# Patient Record
Sex: Male | Born: 1955
Health system: Southern US, Community
[De-identification: ages and names within clinical notes are randomized; demographics above are authoritative.]

## PROBLEM LIST (undated history)

## (undated) DIAGNOSIS — E785 Hyperlipidemia, unspecified: Secondary | ICD-10-CM

## (undated) DIAGNOSIS — M545 Low back pain, unspecified: Secondary | ICD-10-CM

## (undated) DIAGNOSIS — R7303 Prediabetes: Secondary | ICD-10-CM

## (undated) DIAGNOSIS — M25562 Pain in left knee: Secondary | ICD-10-CM

## (undated) DIAGNOSIS — R972 Elevated prostate specific antigen [PSA]: Secondary | ICD-10-CM

## (undated) DIAGNOSIS — M67911 Unspecified disorder of synovium and tendon, right shoulder: Secondary | ICD-10-CM

## (undated) DIAGNOSIS — C61 Malignant neoplasm of prostate: Secondary | ICD-10-CM

## (undated) DIAGNOSIS — E119 Type 2 diabetes mellitus without complications: Secondary | ICD-10-CM

## (undated) DIAGNOSIS — G4730 Sleep apnea, unspecified: Secondary | ICD-10-CM

## (undated) HISTORY — DX: Sleep apnea, unspecified: G47.30

## (undated) HISTORY — PX: COLONOSCOPY: SHX174

## (undated) HISTORY — DX: Hyperlipidemia, unspecified: E78.5

## (undated) HISTORY — PX: PROSTATE BIOPSY: SHX241

---

## 2014-01-11 ENCOUNTER — Encounter (HOSPITAL_COMMUNITY): Payer: Self-pay | Admitting: Emergency Medicine

## 2014-01-11 ENCOUNTER — Emergency Department (HOSPITAL_COMMUNITY)
Admission: EM | Admit: 2014-01-11 | Discharge: 2014-01-11 | Disposition: A | Discharge status: Home / Self Care | Payer: No Typology Code available for payment source | Attending: Emergency Medicine | Admitting: Emergency Medicine

## 2014-01-11 DIAGNOSIS — Y92410 Unspecified street and highway as the place of occurrence of the external cause: Secondary | ICD-10-CM | POA: Insufficient documentation

## 2014-01-11 DIAGNOSIS — Y9389 Activity, other specified: Secondary | ICD-10-CM | POA: Insufficient documentation

## 2014-01-11 DIAGNOSIS — IMO0002 Reserved for concepts with insufficient information to code with codable children: Secondary | ICD-10-CM | POA: Insufficient documentation

## 2014-01-11 DIAGNOSIS — S139XXA Sprain of joints and ligaments of unspecified parts of neck, initial encounter: Secondary | ICD-10-CM | POA: Insufficient documentation

## 2014-01-11 DIAGNOSIS — S161XXA Strain of muscle, fascia and tendon at neck level, initial encounter: Secondary | ICD-10-CM

## 2014-01-11 DIAGNOSIS — S0990XA Unspecified injury of head, initial encounter: Secondary | ICD-10-CM | POA: Insufficient documentation

## 2014-01-11 MED ORDER — NAPROXEN 500 MG PO TABS: 500.0000 mg | ORAL_TABLET | Freq: Two times a day (BID) | ORAL | Status: DC
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
## 2014-01-15 NOTE — ED Provider Notes (Signed)
Medical screening examination/treatment/procedure(s) were performed by non-physician practitioner and as supervising physician I was immediately available for consultation/collaboration.   EKG Interpretation None       Keyaira Clapham R. Amad Mau, MD 01/15/14 0032 

## 2017-02-09 ENCOUNTER — Ambulatory Visit (HOSPITAL_COMMUNITY)
Admission: RE | Admit: 2017-02-09 | Discharge: 2017-02-09 | Disposition: A | Discharge status: Home / Self Care | Payer: Self-pay | Source: Ambulatory Visit | Attending: Family Medicine | Admitting: Family Medicine

## 2017-02-09 ENCOUNTER — Ambulatory Visit: Payer: Self-pay | Attending: Family Medicine | Admitting: Family Medicine

## 2017-02-09 VITALS — BP 144/73 | HR 87 | Temp 97.9°F | Resp 18 | Ht 73.0 in | Wt 236.6 lb

## 2017-02-09 DIAGNOSIS — K219 Gastro-esophageal reflux disease without esophagitis: Secondary | ICD-10-CM | POA: Insufficient documentation

## 2017-02-09 DIAGNOSIS — M5442 Lumbago with sciatica, left side: Secondary | ICD-10-CM | POA: Insufficient documentation

## 2017-02-09 DIAGNOSIS — G8929 Other chronic pain: Secondary | ICD-10-CM | POA: Insufficient documentation

## 2017-02-09 DIAGNOSIS — Z8711 Personal history of peptic ulcer disease: Secondary | ICD-10-CM | POA: Insufficient documentation

## 2017-02-09 DIAGNOSIS — Z8719 Personal history of other diseases of the digestive system: Secondary | ICD-10-CM | POA: Insufficient documentation

## 2017-02-09 DIAGNOSIS — M5441 Lumbago with sciatica, right side: Secondary | ICD-10-CM | POA: Insufficient documentation

## 2017-02-09 MED ORDER — TRAMADOL HCL 50 MG PO TABS: 50.0000 mg | ORAL_TABLET | Freq: Three times a day (TID) | ORAL | 0 refills | Status: DC | PRN

## 2017-02-09 MED ORDER — ACETAMINOPHEN 500 MG PO TABS: 1000.0000 mg | ORAL_TABLET | Freq: Four times a day (QID) | ORAL | 0 refills | Status: DC | PRN

## 2017-02-09 MED ORDER — OMEPRAZOLE 20 MG PO CPDR: 20.0000 mg | DELAYED_RELEASE_CAPSULE | Freq: Every day | ORAL | 2 refills | Status: DC

## 2017-02-09 NOTE — Progress Notes (Signed)
Patient is here for back pain.

## 2017-02-09 NOTE — Progress Notes (Signed)
Subjective:  Patient ID: Roy Reed, male    DOB: 12/15/1955  Age: 61 y.o. MRN: [id number]  CC: Back Pain   Interpreter Wilburn Mylar [id number]  HPI Roy Reed presents for complains of chronic low back pain. The patient first noted symptoms 15years ago. It was not related to no known injury. The pain is rated 7 /10, and is located at the across the lower back. The pain is described as aching and occurs intermittently. The symptoms denies been progressive. Symptoms are exacerbated by sitting and standing. Factors which relieve the pain include change in body position, NSAIDs and rest. Other associated symptoms include tingling in the right leg and tingling in the left leg. Previous history of symptoms: the problem is long-standing.  Treatment efforts have included prescription NSAIDS, PT, home exercises, massage, which he reports receiving in [place], with and without relief.  GERD: Patient complains of heartburn. This has been associated with midespigastric pain.  He denies dysphagia, hematemesis and melena. Symptoms have been present for several years. He denies dysphagia.  He has not lost weight. He denies melena, hematochezia, hematemesis, and coffee ground emesis. He is currently not taking anything for symptoms. He reports history of gastric ulcer 17 years ago.     Outpatient Medications Prior to Visit  Medication Sig Dispense Refill  . naproxen (NAPROSYN) 500 MG tablet Take 1 tablet (500 mg total) by mouth 2 (two) times daily with a meal. (Patient not taking: Reported on 02/09/2017) 30 tablet 0   No facility-administered medications prior to visit.     ROS Review of Systems  Constitutional: Negative.   HENT: Negative.   Eyes: Negative.   Respiratory: Negative.   Cardiovascular: Negative.   Gastrointestinal: Positive for abdominal pain (mid-epigastric tenderness).       Heartburn  Musculoskeletal: Positive for back pain.  Skin: Negative.     Objective:  BP (!) 144/73  (BP Location: Left Arm, Patient Position: Sitting, Cuff Size: Normal)   Pulse 87   Temp 97.9 F (36.6 C) (Oral)   Resp 18   Ht 6\' 1"  (1.854 m)   Wt 236 lb 9.6 oz (107.3 kg)   SpO2 99%   BMI 31.22 kg/m   BP/Weight 02/05/8365 10/16/4763  Systolic BP 465 035  Diastolic BP 73 74  Wt. (Lbs) 236.6 190  BMI 31.22 27.26     Physical Exam  Constitutional: He appears well-developed and well-nourished.  HENT:  Head: Normocephalic and atraumatic.  Right Ear: External ear normal.  Left Ear: External ear normal.  Nose: Nose normal.  Mouth/Throat: Oropharynx is clear and moist.  Eyes: Conjunctivae are normal. Pupils are equal, round, and reactive to light.  Neck: Normal range of motion. Neck supple.  Cardiovascular: Normal rate, regular rhythm, normal heart sounds and intact distal pulses.   Pulmonary/Chest: Effort normal and breath sounds normal.  Abdominal: Soft. Bowel sounds are normal. There is no tenderness.  Musculoskeletal:       Right shoulder: He exhibits pain. He exhibits normal range of motion.  Lymphadenopathy:    He has no cervical adenopathy.  Neurological: He has normal reflexes.  Skin: Skin is warm and dry.  Psychiatric: He has a normal mood and affect.  Nursing note and vitals reviewed.   Assessment & Plan:   Problem List Items Addressed This Visit    None    Visit Diagnoses    Chronic bilateral low back pain with bilateral sciatica    -  Primary   Elevated BP this visit.  May be pain induced. Recommend scheduling BP recheck in 2 weeks with clinic    RN.If BP is greater than 90/60 (MAP 65 or greater) but not less than 130/80 may add amlodipine 5 mg QD    and recheck in another 2 weeks with clinic RN.   Relevant Medications   traMADol (ULTRAM) 50 MG tablet   acetaminophen (TYLENOL) 500 MG tablet   Other Relevant Orders   DG Lumbar Spine Complete (Completed)   Gastroesophageal reflux disease without esophagitis       Relevant Medications   omeprazole (PRILOSEC)  20 MG capsule   Other Relevant Orders   H. pylori breath test (Completed)      Meds ordered this encounter  Medications  . traMADol (ULTRAM) 50 MG tablet    Sig: Take 1 tablet (50 mg total) by mouth every 8 (eight) hours as needed for severe pain.    Dispense:  30 tablet    Refill:  0    Order Specific Question:   Supervising Provider    Answer:   Tresa Garter [id number]  . omeprazole (PRILOSEC) 20 MG capsule    Sig: Take 1 capsule (20 mg total) by mouth daily.    Dispense:  30 capsule    Refill:  2    Order Specific Question:   Supervising Provider    Answer:   Tresa Garter [id number]  . acetaminophen (TYLENOL) 500 MG tablet    Sig: Take 2 tablets (1,000 mg total) by mouth every 6 (six) hours as needed for moderate pain.    Dispense:  30 tablet    Refill:  0    Order Specific Question:   Supervising Provider    Answer:   Tresa Garter [[id number]]    Follow-up: Return in about 2 weeks (around 02/23/2017) for BP check with clinic RN.   Alfonse Spruce FNP

## 2017-02-10 LAB — H. PYLORI BREATH TEST: H pylori Breath Test: NEGATIVE

## 2017-02-14 ENCOUNTER — Other Ambulatory Visit: Payer: Self-pay | Admitting: Family Medicine

## 2017-02-14 ENCOUNTER — Telehealth: Payer: Self-pay

## 2017-02-14 DIAGNOSIS — G8929 Other chronic pain: Secondary | ICD-10-CM | POA: Insufficient documentation

## 2017-02-14 DIAGNOSIS — M5441 Lumbago with sciatica, right side: Principal | ICD-10-CM

## 2017-02-14 DIAGNOSIS — M5442 Lumbago with sciatica, left side: Principal | ICD-10-CM

## 2017-02-14 NOTE — Telephone Encounter (Signed)
-----   Message from Alfonse Spruce, [place] sent at 02/14/2017 12:46 PM EDT ----- Low back xray. Negative for fracture , normal spinal alignment, normal spinal discs. Due to chronic nature of pain and therapies in the past you will be referred to orthopedics.

## 2017-02-14 NOTE — Telephone Encounter (Signed)
CMA call regarding lab results   Patient did not answer but left a VM stating the reason of the call &  to call me back  

## 2017-02-15 ENCOUNTER — Telehealth: Payer: Self-pay

## 2017-02-15 NOTE — Telephone Encounter (Signed)
-----   Message from Alfonse Spruce, [place] sent at 02/14/2017 12:39 PM EDT ----- -H.pylori is negative. H.pylori is a bacteria that can infect the stomach and cause stomach ulcers.

## 2017-02-15 NOTE — Telephone Encounter (Signed)
-----   Message from Alfonse Spruce, [place] sent at 02/14/2017 12:46 PM EDT ----- Low back xray. Negative for fracture , normal spinal alignment, normal spinal discs. Due to chronic nature of pain and therapies in the past you will be referred to orthopedics.

## 2017-02-15 NOTE — Telephone Encounter (Signed)
CMA called second time regarding lab results  Patient did not answer & No VM set up

## 2017-02-21 ENCOUNTER — Ambulatory Visit: Payer: Self-pay

## 2017-02-28 ENCOUNTER — Ambulatory Visit (HOSPITAL_BASED_OUTPATIENT_CLINIC_OR_DEPARTMENT_OTHER): Payer: Self-pay | Admitting: Family Medicine

## 2017-02-28 ENCOUNTER — Ambulatory Visit: Payer: Self-pay

## 2017-02-28 ENCOUNTER — Ambulatory Visit: Payer: Self-pay | Attending: Family Medicine | Admitting: *Deleted

## 2017-02-28 VITALS — BP 115/80 | HR 85

## 2017-02-28 VITALS — BP 117/70 | HR 90 | Temp 98.2°F | Resp 18 | Ht 73.0 in | Wt 233.6 lb

## 2017-02-28 DIAGNOSIS — G4700 Insomnia, unspecified: Secondary | ICD-10-CM | POA: Insufficient documentation

## 2017-02-28 DIAGNOSIS — M5442 Lumbago with sciatica, left side: Secondary | ICD-10-CM

## 2017-02-28 DIAGNOSIS — G8929 Other chronic pain: Secondary | ICD-10-CM | POA: Insufficient documentation

## 2017-02-28 DIAGNOSIS — R7303 Prediabetes: Secondary | ICD-10-CM | POA: Insufficient documentation

## 2017-02-28 DIAGNOSIS — R350 Frequency of micturition: Secondary | ICD-10-CM

## 2017-02-28 DIAGNOSIS — K219 Gastro-esophageal reflux disease without esophagitis: Secondary | ICD-10-CM | POA: Insufficient documentation

## 2017-02-28 DIAGNOSIS — M5441 Lumbago with sciatica, right side: Secondary | ICD-10-CM | POA: Insufficient documentation

## 2017-02-28 DIAGNOSIS — Z0130 Encounter for examination of blood pressure without abnormal findings: Secondary | ICD-10-CM

## 2017-02-28 DIAGNOSIS — K259 Gastric ulcer, unspecified as acute or chronic, without hemorrhage or perforation: Secondary | ICD-10-CM | POA: Insufficient documentation

## 2017-02-28 DIAGNOSIS — R5383 Other fatigue: Secondary | ICD-10-CM | POA: Insufficient documentation

## 2017-02-28 DIAGNOSIS — R51 Headache: Secondary | ICD-10-CM | POA: Insufficient documentation

## 2017-02-28 LAB — POCT URINALYSIS DIPSTICK
Bilirubin, UA: NEGATIVE
GLUCOSE UA: NEGATIVE
KETONES UA: NEGATIVE
Leukocytes, UA: NEGATIVE
Nitrite, UA: NEGATIVE
SPEC GRAV UA: 1.02 (ref 1.010–1.025)
Urobilinogen, UA: 0.2 E.U./dL
pH, UA: 6.5 (ref 5.0–8.0)

## 2017-02-28 LAB — POCT GLYCOSYLATED HEMOGLOBIN (HGB A1C): HEMOGLOBIN A1C: 5.8

## 2017-02-28 MED ORDER — TRAZODONE HCL 50 MG PO TABS: 25.0000 mg | ORAL_TABLET | Freq: Every evening | ORAL | 1 refills | Status: DC | PRN

## 2017-02-28 MED ORDER — ACETAMINOPHEN 500 MG PO TABS: 1000.0000 mg | ORAL_TABLET | Freq: Four times a day (QID) | ORAL | 0 refills | Status: DC | PRN

## 2017-02-28 NOTE — Progress Notes (Signed)
Subjective:  Patient ID: Roy Reed, male    DOB: 1956-02-22  Age: 61 y.o. MRN: [id number]  CC: Establish Care   HPI Roy Reed presents for symptoms of intermittent headache, fatigue, urine frequency, and insomnia for the last 3 months. PHM of chronic back pain, GERD, and gastric ulcer. Reports He denies any visual disturbances, difficulty walking or keeping balance, poor appetite, or bowel changes. He denies any history of loud snoring, daytime sleepiness, or anxiety. He denies any hematuria or dysuria. He does reports frequent urination every 2 to 3 hours that wakes him up at night.     Outpatient Medications Prior to Visit  Medication Sig Dispense Refill  . omeprazole (PRILOSEC) 20 MG capsule Take 1 capsule (20 mg total) by mouth daily. 30 capsule 2  . traMADol (ULTRAM) 50 MG tablet Take 1 tablet (50 mg total) by mouth every 8 (eight) hours as needed for severe pain. 30 tablet 0  . acetaminophen (TYLENOL) 500 MG tablet Take 2 tablets (1,000 mg total) by mouth every 6 (six) hours as needed for moderate pain. 30 tablet 0  . naproxen (NAPROSYN) 500 MG tablet Take 1 tablet (500 mg total) by mouth 2 (two) times daily with a meal. (Patient not taking: Reported on 02/09/2017) 30 tablet 0   No facility-administered medications prior to visit.     ROS Review of Systems  Constitutional: Positive for fatigue.  Eyes: Negative for visual disturbance.  Respiratory: Negative.   Cardiovascular: Negative.   Gastrointestinal: Negative.   Genitourinary: Positive for frequency.  Skin: Negative.   Neurological: Positive for headaches.  Psychiatric/Behavioral: Positive for sleep disturbance.    Objective:  BP 117/70 (BP Location: Left Arm, Patient Position: Sitting, Cuff Size: Normal)   Pulse 90   Temp 98.2 F (36.8 C) (Oral)   Resp 18   Ht 6\' 1"  (1.854 m)   Wt 233 lb 9.6 oz (106 kg)   SpO2 98%   BMI 30.82 kg/m   BP/Weight 02/28/2017 4/00/8676 09/15/5091  Systolic BP 267 124 580    Diastolic BP 70 80 73  Wt. (Lbs) 233.6 - 236.6  BMI 30.82 - 31.22   Physical Exam  Constitutional: He is oriented to person, place, and time. He appears well-developed and well-nourished.  HENT:  Head: Normocephalic and atraumatic.  Right Ear: External ear normal.  Left Ear: External ear normal.  Nose: Nose normal.  Mouth/Throat: Oropharynx is clear and moist.  Eyes: Conjunctivae are normal. Pupils are equal, round, and reactive to light.  Neck: Normal range of motion. Neck supple.  Cardiovascular: Normal rate, regular rhythm, normal heart sounds and intact distal pulses.   Pulmonary/Chest: Effort normal and breath sounds normal.  Abdominal: Soft. Bowel sounds are normal. There is no tenderness.  Lymphadenopathy:    He has no cervical adenopathy.  Neurological: He is alert and oriented to person, place, and time.  Skin: Skin is warm and dry.  Psychiatric: He has a normal mood and affect.  Nursing note and vitals reviewed.  Assessment & Plan:   Problem List Items Addressed This Visit      Nervous and Auditory   Chronic bilateral low back pain with bilateral sciatica - Primary   Relevant Medications   acetaminophen (TYLENOL) 500 MG tablet   traZODone (DESYREL) 50 MG tablet    Other Visit Diagnoses    Urine frequency       Will obtain PSA and U/A based on symptoms   Relevant Orders   PSA (Completed)  Urinalysis Dipstick (Completed)   Insomnia, unspecified type       Relevant Medications   traZODone (DESYREL) 50 MG tablet   Fatigue, unspecified type       Relevant Orders   CBC with Differential (Completed)   Chronic nonintractable headache, unspecified headache type       Relevant Medications   acetaminophen (TYLENOL) 500 MG tablet   Prediabetes              Relevant Orders                POCT glycosylated hemoglobin (Hb A1C)        Meds ordered this encounter  Medications  . DISCONTD: traZODone (DESYREL) 50 MG tablet    Sig: Take 0.5-1 tablets (25-50 mg  total) by mouth at bedtime as needed for sleep.    Dispense:  30 tablet    Refill:  1    Order Specific Question:   Supervising Provider    Answer:   Tresa Garter [id number]  . acetaminophen (TYLENOL) 500 MG tablet    Sig: Take 2 tablets (1,000 mg total) by mouth every 6 (six) hours as needed for moderate pain.    Dispense:  30 tablet    Refill:  0    Order Specific Question:   Supervising Provider    Answer:   Tresa Garter [id number]  . traZODone (DESYREL) 50 MG tablet    Sig: Take 0.5-1 tablets (25-50 mg total) by mouth at bedtime as needed for sleep.    Dispense:  30 tablet    Refill:  1    Follow-up: Return in about 3 months (around 05/31/2017), or if symptoms worsen or fail to improve, for Prediabetes.   Alfonse Spruce FNP

## 2017-02-28 NOTE — Progress Notes (Signed)
Patient is here for left knee pain   Patient complains about headaches

## 2017-02-28 NOTE — Patient Instructions (Signed)
Prediabetes Prediabetes is the condition of having a blood sugar (blood glucose) level that is higher than it should be, but not high enough for you to be diagnosed with type 2 diabetes. Having prediabetes puts you at risk for developing type 2 diabetes (type 2 diabetes mellitus). Prediabetes may be called impaired glucose tolerance or impaired fasting glucose. Prediabetes usually does not cause symptoms. Your health care provider can diagnose this condition with blood tests. You may be tested for prediabetes if you are overweight and if you have at least one other risk factor for prediabetes. Risk factors for prediabetes include:  Having a family member with type 2 diabetes.  Being overweight or obese.  Being older than age 57.  Being of American-Indian, African-American, Hispanic/Latino, or Asian/Pacific Islander descent.  Having an inactive (sedentary) lifestyle.  Having a history of gestational diabetes or polycystic ovarian syndrome (PCOS).  Having low levels of good cholesterol (HDL-C) or high levels of blood fats (triglycerides).  Having high blood pressure.  What is blood glucose and how is blood glucose measured?  Blood glucose refers to the amount of glucose in your bloodstream. Glucose comes from eating foods that contain sugars and starches (carbohydrates) that the body breaks down into glucose. Your blood glucose level may be measured in mg/dL (milligrams per deciliter) or mmol/L (millimoles per liter).Your blood glucose may be checked with one or more of the following blood tests:  A fasting blood glucose (FBG) test. You will not be allowed to eat (you will fast) for at least 8 hours before a blood sample is taken. ? A normal range for FBG is 70-100 mg/dl (3.9-5.6 mmol/L).  An A1c (hemoglobin A1c) blood test. This test provides information about blood glucose control over the previous 2?68month.  An oral glucose tolerance test (OGTT). This test measures your blood  glucose twice: ? After fasting. This is your baseline level. ? Two hours after you drink a beverage that contains glucose.  You may be diagnosed with prediabetes:  If your FBG is 100?125 mg/dL (5.6-6.9 mmol/L).  If your A1c level is 5.7?6.4%.  If your OGGT result is 140?199 mg/dL (7.8-11 mmol/L).  These blood tests may be repeated to confirm your diagnosis. What happens if blood glucose is too high? The pancreas produces a hormone (insulin) that helps move glucose from the bloodstream into cells. When cells in the body do not respond properly to insulin that the body makes (insulin resistance), excess glucose builds up in the blood instead of going into cells. As a result, high blood glucose (hyperglycemia) can develop, which can cause many complications. This is a symptom of prediabetes. What can happen if blood glucose stays higher than normal for a long time? Having high blood glucose for a long time is dangerous. Too much glucose in your blood can damage your nerves and blood vessels. Long-term damage can lead to complications from diabetes, which may include:  Heart disease.  Stroke.  Blindness.  Kidney disease.  Depression.  Poor circulation in the feet and legs, which could lead to surgical removal (amputation) in severe cases.  How can prediabetes be prevented from turning into type 2 diabetes?  To help prevent type 2 diabetes, take the following actions:  Be physically active. ? Do moderate-intensity physical activity for at least 30 minutes on at least 5 days of the week, or as much as told by your health care provider. This could be brisk walking, biking, or water aerobics. ? Ask your health care provider what  activities are safe for you. A mix of physical activities may be best, such as walking, swimming, cycling, and strength training.  Lose weight as told by your health care provider. ? Losing 5-7% of your body weight can reverse insulin resistance. ? Your health  care provider can determine how much weight loss is best for you and can help you lose weight safely.  Follow a healthy meal plan. This includes eating lean proteins, complex carbohydrates, fresh fruits and vegetables, low-fat dairy products, and healthy fats. ? Follow instructions from your health care provider about eating or drinking restrictions. ? Make an appointment to see a diet and nutrition specialist (registered dietitian) to help you create a healthy eating plan that is right for you.  Do not smoke or use any tobacco products, such as cigarettes, chewing tobacco, and e-cigarettes. If you need help quitting, ask your health care provider.  Take over-the-counter and prescription medicines as told by your health care provider. You may be prescribed medicines that help lower the risk of type 2 diabetes.  This information is not intended to replace advice given to you by your health care provider. Make sure you discuss any questions you have with your health care provider. Document Released: 12/15/2015 Document Revised: 01/29/2016 Document Reviewed: 10/14/2015 Elsevier Interactive Patient Education  2018 Elsevier Inc.  

## 2017-02-28 NOTE — Progress Notes (Signed)
Pt arrived to [facility]. Pt alert and oriented and arrives in good spirits. Last OV  02/09/17 with M. Braulio Conte.  Pt denies chest pain, SOB,dizziness, or blurred vision. He does c/o headache since last OV. Medication verified.  Manual blood pressure reading: 115/80   Interpreter assistance provided by [place] visit to f/u headache today. Continue medication as prescribed.

## 2017-03-01 LAB — CBC WITH DIFFERENTIAL/PLATELET
BASOS ABS: 0 10*3/uL (ref 0.0–0.2)
BASOS: 0 %
EOS (ABSOLUTE): 0 10*3/uL (ref 0.0–0.4)
Eos: 1 %
Hematocrit: 41.4 % (ref 37.5–51.0)
Hemoglobin: 13.9 g/dL (ref 13.0–17.7)
IMMATURE GRANS (ABS): 0 10*3/uL (ref 0.0–0.1)
IMMATURE GRANULOCYTES: 0 %
LYMPHS: 43 %
Lymphocytes Absolute: 2.6 10*3/uL (ref 0.7–3.1)
MCH: 27.6 pg (ref 26.6–33.0)
MCHC: 33.6 g/dL (ref 31.5–35.7)
MCV: 82 fL (ref 79–97)
MONOS ABS: 0.5 10*3/uL (ref 0.1–0.9)
Monocytes: 8 %
NEUTROS PCT: 48 %
Neutrophils Absolute: 2.9 10*3/uL (ref 1.4–7.0)
Platelets: 263 10*3/uL (ref 150–379)
RBC: 5.04 x10E6/uL (ref 4.14–5.80)
RDW: 13.6 % (ref 12.3–15.4)
WBC: 6 10*3/uL (ref 3.4–10.8)

## 2017-03-01 LAB — PSA: PROSTATE SPECIFIC AG, SERUM: 50.4 ng/mL — AB (ref 0.0–4.0)

## 2017-03-01 MED FILL — ?TRAZODONE 50 MG TABLET: 50 | 30 days supply | Qty: 30 | Fill #0

## 2017-03-07 ENCOUNTER — Other Ambulatory Visit: Payer: Self-pay | Admitting: Family Medicine

## 2017-03-07 ENCOUNTER — Telehealth: Payer: Self-pay | Admitting: Family Medicine

## 2017-03-07 DIAGNOSIS — R972 Elevated prostate specific antigen [PSA]: Secondary | ICD-10-CM

## 2017-03-07 DIAGNOSIS — R350 Frequency of micturition: Secondary | ICD-10-CM

## 2017-03-07 MED ORDER — TAMSULOSIN HCL 0.4 MG PO CAPS: 0.4000 mg | ORAL_CAPSULE | Freq: Every day | ORAL | 2 refills | Status: DC

## 2017-03-07 NOTE — Telephone Encounter (Signed)
Patient's son called and states his father got blood work and has not received results, patient is also still experiencing headaches...please advised

## 2017-03-10 NOTE — Telephone Encounter (Signed)
-----   Message from Alfonse Spruce, [place] sent at 03/07/2017  9:01 AM EDT ----- PSA is elevated. PSA is screens for prostate problems. You will be referred to urology for further evaluation. It is important that you  follow up with urology. You will be prescribed tamusolin for urinary frequency.  Labs that evaluated your blood cells were normal. No signs of anemia, acute infection, or inflammation present.

## 2017-03-10 NOTE — Telephone Encounter (Signed)
CMA call patient son regarding lab results  Patient son did not answer but other son did left a message with brother stating the reason of the call & to call back

## 2017-03-21 NOTE — Telephone Encounter (Signed)
Noted   Urology   Sent Referral to [facility] ph. # [phone]. They will contact the patient to schedule an appointment. Pt will go to [place] office cause accept the [facility] discount ph. # [phone] Address [address] [place] [address] Referral to [facility]  They will contact the patient to schedule an appointment .  Thank You

## 2017-03-21 NOTE — Telephone Encounter (Signed)
Pt. Called stating that he has been approved for the 100% discount. Pt. Was referred to the urology and the orthopedics.  Please f/u with pt.

## 2017-05-03 ENCOUNTER — Encounter (INDEPENDENT_AMBULATORY_CARE_PROVIDER_SITE_OTHER): Payer: Self-pay | Admitting: Orthopaedic Surgery

## 2017-05-03 ENCOUNTER — Ambulatory Visit (INDEPENDENT_AMBULATORY_CARE_PROVIDER_SITE_OTHER): Payer: Self-pay | Admitting: Orthopaedic Surgery

## 2017-05-03 VITALS — BP 143/86 | HR 86 | Ht 73.0 in | Wt 236.0 lb

## 2017-05-03 DIAGNOSIS — M545 Low back pain: Secondary | ICD-10-CM

## 2017-05-03 DIAGNOSIS — G8929 Other chronic pain: Secondary | ICD-10-CM

## 2017-05-03 NOTE — Progress Notes (Signed)
Office Visit Note/orthopedic consultation   Patient: Roy Reed           Date of Birth: 28-Jan-1956           MRN: [id number] Visit Date: 05/03/2017              Requested by: Alfonse Spruce, [address] [place], [place] [address] PCP: Alfonse Spruce, FNP   Assessment & Plan: Visit Diagnoses:  1. Chronic low back pain, unspecified back pain laterality, with sciatica presence unspecified     Plan: We discussed exercise program. He has an upcoming urology evaluation for his elevated PSA. No evidence radiculopathy on exam. Nerve root tension signs no isolated motor weakness. We discussed overall fitness with continued exercise gradual weight loss to help with his back. Thank for the opportunity to see him in consultation  Follow-Up Instructions: No Follow-up on file.   Orders:  No orders of the defined types were placed in this encounter.  No orders of the defined types were placed in this encounter.     Procedures: No procedures performed   Clinical Data: No additional findings.   Subjective: Chief Complaint  Patient presents with  . Lower Back - Pain    HPI 61 year old male who states he is retired used to be a Customer service manager he speaks [place] and is here with an Astronomer. He's had back pain for greater than 20 years. The he states he has a little bit more prominences left leg and right leg. He likes to play tennis and is still active playing. He had a PSA test 02/28/2017 which is 50.4 and has an upcoming appointment with urologist.. Had a urine dipstick which showed some moderate blood trace proteins negative for glucose.  Review of Systems uses performed is a chronic back discomfort for 20 years. Had elevated PSA history of gastric ulcer. He states had prostate exam back in [place] and was noted that he had an elevated PSA at that time. He takes the Prilosec also some doesn't roll.   Objective: Vital Signs: BP (!) 143/86   Pulse 86   Ht 6\' 1"   (1.854 m)   Wt 236 lb (107 kg)   BMI 31.14 kg/m   Physical Exam  Constitutional: He is oriented to person, place, and time. He appears well-developed and well-nourished.  HENT:  Head: Normocephalic and atraumatic.  Eyes: Pupils are equal, round, and reactive to light. EOM are normal.  Neck: No tracheal deviation present. No thyromegaly present.  Cardiovascular: Normal rate.   Pulmonary/Chest: Effort normal. He has no wheezes.  Abdominal: Soft. Bowel sounds are normal.  Neurological: He is alert and oriented to person, place, and time.  Skin: Skin is warm and dry. Capillary refill takes less than 2 seconds.  Psychiatric: He has a normal mood and affect. His behavior is normal. Judgment and thought content normal.    Ortho Exam patient has normal hip range of motion. Negative Corky Sox test needs reach full extension negative straight leg raising negative popped of compression test. Patient's able to heel and toe walk normally there is no atrophy no rash or exposed skin. Distal pulses are intact.  Specialty Comments:  No specialty comments available.  Imaging: Study Result   CLINICAL DATA:  Low back pain.  LEFT leg pain.  EXAM: LUMBAR SPINE - COMPLETE 4+ VIEW  COMPARISON:  None.  FINDINGS: There is no evidence of lumbar spine fracture. Alignment is normal. Intervertebral disc spaces are maintained.  IMPRESSION: Negative.  Electronically Signed   By: Staci Righter M.D.   On: 02/09/2017 10:39       PMFS History: Patient Active Problem List   Diagnosis Date Noted  . Personal history of gastric ulcer 02/09/2017   No past medical history on file.  No family history on file.  No past surgical history on file. Social History   Occupational History  . Not on file.   Social History Main Topics  . Smoking status: Never Smoker  . Smokeless tobacco: Never Used  . Alcohol use No  . Drug use: No  . Sexual activity: Not on file

## 2017-05-11 ENCOUNTER — Ambulatory Visit (INDEPENDENT_AMBULATORY_CARE_PROVIDER_SITE_OTHER): Payer: Self-pay | Admitting: Urology

## 2017-05-11 DIAGNOSIS — R972 Elevated prostate specific antigen [PSA]: Secondary | ICD-10-CM

## 2017-06-01 ENCOUNTER — Encounter: Payer: Self-pay | Admitting: Physician Assistant

## 2017-06-01 ENCOUNTER — Ambulatory Visit: Payer: Self-pay | Attending: Family Medicine | Admitting: Physician Assistant

## 2017-06-01 VITALS — BP 121/72 | HR 83 | Temp 98.8°F | Resp 18 | Ht 73.0 in | Wt 234.2 lb

## 2017-06-01 DIAGNOSIS — Z789 Other specified health status: Secondary | ICD-10-CM

## 2017-06-01 DIAGNOSIS — M25511 Pain in right shoulder: Secondary | ICD-10-CM | POA: Insufficient documentation

## 2017-06-01 DIAGNOSIS — R972 Elevated prostate specific antigen [PSA]: Secondary | ICD-10-CM | POA: Insufficient documentation

## 2017-06-01 MED ORDER — NAPROXEN 500 MG PO TABS: 500.0000 mg | ORAL_TABLET | Freq: Two times a day (BID) | ORAL | 0 refills | Status: DC

## 2017-06-01 MED ORDER — METHOCARBAMOL 500 MG PO TABS: 500.0000 mg | ORAL_TABLET | Freq: Three times a day (TID) | ORAL | 0 refills | Status: DC

## 2017-06-01 MED FILL — METHOCARBAMOL 500 MG TABLET: 500 | 30 days supply | Qty: 90 | Fill #0

## 2017-06-01 MED FILL — NAPROXEN 500 MG TABLET: 500 | 30 days supply | Qty: 60 | Fill #0

## 2017-06-01 NOTE — Progress Notes (Signed)
Patient ID: Roy Reed, male   DOB: June 05, 1956, 61 y.o.   MRN: [id number]    Roy Reed, is a 61 y.o. male  QVZ:[id number]  [id number]:[id number]  DOB - 19-Mar-1956  Subjective:  Chief Complaint and HPI: Roy Reed is a 61 y.o. male here today for Pain R shoulder worse X 1 month now. Plays tennis and this has bothered him for a while, but a lot worse for about 1 month. Creams for massaging the shoulder help only little Pain Worse at night.  No f/c.  No known specific injury.  Amal with [employer] interpreters Saw urologist for elevated PSA and has biopsy scheduled for next month.    ROS:   Constitutional:  No f/c, No night sweats, No unexplained weight loss. EENT:  No vision changes, No blurry vision, No hearing changes. No mouth, throat, or ear problems.  Respiratory: No cough, No SOB Cardiac: No CP, no palpitations GI:  No abd pain, No N/V/D. GU: No Urinary s/sx Musculoskeletal: +R shoulder pain Neuro: No headache, no dizziness, no motor weakness.  Skin: No rash Endocrine:  No polydipsia. No polyuria.  Psych: Denies SI/HI  No problems updated.  ALLERGIES: No Known Allergies  PAST MEDICAL HISTORY: No past medical history on file.  MEDICATIONS AT HOME: Prior to Admission medications   Medication Sig Start Date End Date Taking? Authorizing Provider  acetaminophen (TYLENOL) 500 MG tablet Take 2 tablets (1,000 mg total) by mouth every 6 (six) hours as needed for moderate pain. 02/28/17   Alfonse Spruce, FNP  methocarbamol (ROBAXIN) 500 MG tablet Take 1 tablet (500 mg total) by mouth 3 (three) times daily. X 10 days then prn muscle spasm 06/01/17   Freeman Caldron M, PA-C  naproxen (NAPROSYN) 500 MG tablet Take 1 tablet (500 mg total) by mouth 2 (two) times daily with a meal. X 10 days then prn pain 06/01/17   Freeman Caldron M, PA-C  omeprazole (PRILOSEC) 20 MG capsule Take 1 capsule (20 mg total) by mouth daily. Patient not taking: Reported on 05/03/2017 02/09/17    Alfonse Spruce, FNP  tamsulosin (FLOMAX) 0.4 MG CAPS capsule Take 1 capsule (0.4 mg total) by mouth daily. Patient not taking: Reported on 05/03/2017 03/07/17   Alfonse Spruce, FNP  traMADol (ULTRAM) 50 MG tablet Take 1 tablet (50 mg total) by mouth every 8 (eight) hours as needed for severe pain. Patient not taking: Reported on 05/03/2017 02/09/17   Alfonse Spruce, FNP  traZODone (DESYREL) 50 MG tablet Take 0.5-1 tablets (25-50 mg total) by mouth at bedtime as needed for sleep. Patient not taking: Reported on 05/03/2017 02/28/17   Alfonse Spruce, FNP     Objective:  EXAM:   Vitals:   06/01/17 0839  BP: 121/72  Pulse: 83  Resp: 18  Temp: 98.8 F (37.1 C)  TempSrc: Oral  SpO2: 98%  Weight: 234 lb 3.2 oz (106.2 kg)  Height: 6\' 1"  (1.854 m)    General appearance : A&OX3. NAD. Non-toxic-appearing HEENT: Atraumatic and Normocephalic.  PERRLA. EOM intact.  TM clear B. Mouth-MMM, post pharynx WNL w/o erythema, No PND. Neck: supple, no JVD. No cervical lymphadenopathy. No thyromegaly Chest/Lungs:  Breathing-non-labored, Good air entry bilaterally, breath sounds normal without rales, rhonchi, or wheezing  CVS: S1 S2 regular, no murmurs, gallops, rubs  Extremities: R shoulder with limited ROM.  90 degrees with lateral abduction and unable to lift over head.  No biceps tendon TTP.  No erythema or swelling of the skin.  Unable to externally rotate.  Neg empty can test. + speed's test.  Bilateral Lower Ext shows no edema, both legs are warm to touch with = pulse throughout Neurology:  CN II-XII grossly intact, Non focal.   Psych:  TP linear. J/I WNL. Normal speech. Appropriate eye contact and affect.  Skin:  No Rash  Data Review Lab Results  Component Value Date   HGBA1C 5.8 02/28/2017     Assessment & Plan   1. Acute pain of right shoulder Concern for rotator cuff injury - naproxen (NAPROSYN) 500 MG tablet; Take 1 tablet (500 mg total) by mouth 2 (two) times daily  with a meal. X 10 days then prn pain  Dispense: 60 tablet; Refill: 0 - methocarbamol (ROBAXIN) 500 MG tablet; Take 1 tablet (500 mg total) by mouth 3 (three) times daily. X 10 days then prn muscle spasm  Dispense: 90 tablet; Refill: 0 - Ambulatory referral to Orthopedic Surgery  2. Elevated PSA He did see the urologist and has a prostat  3. Language barrier [employer] interpre[employer] used.  "Amal" translated.       Patient have been counseled extensively about nutrition and exercise  Return in about 3 months (around 08/31/2017), or if symptoms worsen or fail to improve with [place].  The patient was given clear instructions to go to ER or return to medical center if symptoms don't improve, worsen or new problems develop. The patient verbalized understanding. The patient was told to call to get lab results if they haven't heard anything in the next week.     Freeman Caldron, PA-C [facility] [place], [place]   06/01/2017, 8:53 AM

## 2017-06-01 NOTE — Patient Instructions (Signed)

## 2017-06-14 ENCOUNTER — Other Ambulatory Visit: Payer: Self-pay | Admitting: Urology

## 2017-06-14 DIAGNOSIS — R972 Elevated prostate specific antigen [PSA]: Secondary | ICD-10-CM

## 2017-06-15 ENCOUNTER — Ambulatory Visit (HOSPITAL_COMMUNITY): Admission: RE | Admit: 2017-06-15 | Payer: Self-pay | Source: Ambulatory Visit

## 2017-06-15 ENCOUNTER — Ambulatory Visit (HOSPITAL_COMMUNITY)
Admission: RE | Admit: 2017-06-15 | Discharge: 2017-06-15 | Disposition: A | Discharge status: Home / Self Care | Payer: Self-pay | Source: Ambulatory Visit | Attending: Urology | Admitting: Urology

## 2017-06-15 DIAGNOSIS — R972 Elevated prostate specific antigen [PSA]: Secondary | ICD-10-CM

## 2017-06-15 DIAGNOSIS — C61 Malignant neoplasm of prostate: Secondary | ICD-10-CM | POA: Insufficient documentation

## 2017-06-15 MED ORDER — LIDOCAINE HCL (PF) 2 % IJ SOLN
INTRAMUSCULAR | Status: AC
  Administered 2017-06-15: 10 mL
  Filled 2017-06-15: qty 10

## 2017-06-15 MED ORDER — GENTAMICIN SULFATE 40 MG/ML IJ SOLN
INTRAMUSCULAR | Status: AC
  Administered 2017-06-15: 80 mg via INTRAMUSCULAR
  Filled 2017-06-15: qty 2

## 2017-06-15 MED ORDER — GENTAMICIN SULFATE 40 MG/ML IJ SOLN
80.0000 mg | Freq: Once | INTRAMUSCULAR | Status: AC
  Administered 2017-06-15: 80 mg via INTRAMUSCULAR

## 2017-06-27 ENCOUNTER — Ambulatory Visit (INDEPENDENT_AMBULATORY_CARE_PROVIDER_SITE_OTHER): Payer: Self-pay

## 2017-06-27 ENCOUNTER — Ambulatory Visit (INDEPENDENT_AMBULATORY_CARE_PROVIDER_SITE_OTHER): Payer: Self-pay | Admitting: Orthopaedic Surgery

## 2017-06-27 ENCOUNTER — Encounter (INDEPENDENT_AMBULATORY_CARE_PROVIDER_SITE_OTHER): Payer: Self-pay | Admitting: Orthopaedic Surgery

## 2017-06-27 DIAGNOSIS — M25511 Pain in right shoulder: Secondary | ICD-10-CM

## 2017-06-27 DIAGNOSIS — G8929 Other chronic pain: Secondary | ICD-10-CM

## 2017-06-27 DIAGNOSIS — M7541 Impingement syndrome of right shoulder: Secondary | ICD-10-CM

## 2017-06-27 NOTE — Progress Notes (Signed)
Office Visit Note   Patient: Roy Reed           Date of Birth: 11-01-1955           MRN: [id number] Visit Date: 06/27/2017              Requested by: Argentina Donovan, PA-C [place], [place] [address] PCP: Alfonse Spruce, FNP   Assessment & Plan: Visit Diagnoses:  1. Chronic right shoulder pain   2. Impingement syndrome of right shoulder     Plan: Overall impression is rotator cuff syndrome and impingement from large inferior osteophyte and downward sloping acromion.  Subacromial injection was performed today.  MRI of the right shoulder to rule out rotator follow-up in 2 weeks.  Today's encounter was performed through an interpreter.  Follow-Up Instructions: Return in about 2 weeks (around 07/11/2017).   Orders:  Orders Placed This Encounter  Procedures  . XR Shoulder Right  . MR SHOULDER RIGHT WO CONTRAST   No orders of the defined types were placed in this encounter.     Procedures: Large Joint Inj Date/Time: 06/27/2017 11:07 AM Performed by: Leandrew Koyanagi Authorized by: Leandrew Koyanagi   Consent Given by:  Patient Timeout: prior to procedure the correct patient, procedure, and site was verified   Indications:  Pain Location:  Shoulder Site:  R subacromial bursa Prep: patient was prepped and draped in usual sterile fashion   Needle Size:  22 G Approach:  Posterior Ultrasound Guidance: No   Fluoroscopic Guidance: No       Clinical Data: No additional findings.   Subjective: Chief Complaint  Patient presents with  . Right Shoulder - Pain    Patient is a 61 year old gentleman who comes in with 4 couple weeks is worse with playing tennis especially with overhead activities.  Denies any numbness and tingling or radicular pain.  Denies any injuries.  Pain is worse with use of his arm.    Review of Systems  Constitutional: Negative.   All other systems reviewed and are negative.    Objective: Vital Signs: There were no  vitals taken for this visit.  Physical Exam  Constitutional: He is oriented to person, place, and time. He appears well-developed and well-nourished.  HENT:  Head: Normocephalic and atraumatic.  Eyes: Pupils are equal, round, and reactive to light.  Neck: Neck supple.  Pulmonary/Chest: Effort normal.  Abdominal: Soft.  Musculoskeletal: Normal range of motion.  Neurological: He is alert and oriented to person, place, and time.  Skin: Skin is warm.  Psychiatric: He has a normal mood and affect. His behavior is normal. Judgment and thought content normal.  Nursing note and vitals reviewed.   Ortho Exam Right shoulder exam shows positive Neer impingement.  Positive empty can.  Pain with infraspinatus testing.  Positive cross adduction.  Negative crank test. Specialty Comments:  No specialty comments available.  Imaging: Xr Shoulder Right  Result Date: 06/27/2017 Significant acromioclavicular arthrosis with large downsloping acromion and inferior osteophyte    PMFS History: Patient Active Problem List   Diagnosis Date Noted  . Personal history of gastric ulcer 02/09/2017   No past medical history on file.  No family history on file.  No past surgical history on file. Social History   Occupational History  . Not on file.   Social History Main Topics  . Smoking status: Never Smoker  . Smokeless tobacco: Never Used  . Alcohol use No  . Drug use: No  .  Sexual activity: Not on file

## 2017-06-29 ENCOUNTER — Other Ambulatory Visit: Payer: Self-pay

## 2017-07-03 ENCOUNTER — Ambulatory Visit
Admission: RE | Admit: 2017-07-03 | Discharge: 2017-07-03 | Disposition: A | Discharge status: Home / Self Care | Payer: Self-pay | Source: Ambulatory Visit | Attending: Orthopaedic Surgery | Admitting: Orthopaedic Surgery

## 2017-07-03 DIAGNOSIS — M7541 Impingement syndrome of right shoulder: Secondary | ICD-10-CM

## 2017-07-03 DIAGNOSIS — G8929 Other chronic pain: Secondary | ICD-10-CM

## 2017-07-03 DIAGNOSIS — M25511 Pain in right shoulder: Principal | ICD-10-CM

## 2017-07-12 ENCOUNTER — Ambulatory Visit (INDEPENDENT_AMBULATORY_CARE_PROVIDER_SITE_OTHER): Payer: Self-pay | Admitting: Orthopaedic Surgery

## 2017-07-12 ENCOUNTER — Encounter (INDEPENDENT_AMBULATORY_CARE_PROVIDER_SITE_OTHER): Payer: Self-pay | Admitting: Orthopaedic Surgery

## 2017-07-12 DIAGNOSIS — M75101 Unspecified rotator cuff tear or rupture of right shoulder, not specified as traumatic: Secondary | ICD-10-CM

## 2017-07-12 DIAGNOSIS — M7541 Impingement syndrome of right shoulder: Secondary | ICD-10-CM

## 2017-07-12 MED ORDER — TRAMADOL HCL 50 MG PO TABS: 50.0000 mg | ORAL_TABLET | Freq: Three times a day (TID) | ORAL | 2 refills | Status: DC | PRN

## 2017-07-12 NOTE — Progress Notes (Signed)
   Office Visit Note   Patient: Roy Reed           Date of Birth: 04/20/1956           MRN: [id number] Visit Date: 07/12/2017              Requested by: Alfonse Spruce, [place], [place] [address] PCP: Alfonse Spruce, FNP   Assessment & Plan: Visit Diagnoses:  1. Rotator cuff syndrome, right   2. Impingement syndrome of right shoulder     Plan: Impression is right shoulder rotator cuff tear of the anterior supraspinatus that is symptomatic.  He also has symptomatic acromioclavicular joint with type III acromion.  These findings were discussed with the patient and patient has failed conservative treatment therefore recommendation is for arthroscopic repair of rotator cuff tear along with distal clavicle excision and acromioplasty and extensive debridement.  We discussed the risk benefits alternatives to surgery he understands and wishes to proceed.  In the meantime he will let us know what his results of his prostate biopsy is that he can decide whether he can have shoulder surgery first or whether he has prostate cancer.  Follow-Up Instructions: Return if symptoms worsen or fail to improve.   Orders:  No orders of the defined types were placed in this encounter.  Meds ordered this encounter  Medications  . traMADol (ULTRAM) 50 MG tablet    Sig: Take 1-2 tablets (50-100 mg total) 3 (three) times daily as needed by mouth.    Dispense:  30 tablet    Refill:  2      Procedures: No procedures performed   Clinical Data: No additional findings.   Subjective: Chief Complaint  Patient presents with  . Right Shoulder - Pain, Follow-up    Patient is a 61 year old gentleman back today to review his MRI.  He complains of pain with use of his right arm and especially at night pain that radiates into his upper arm.  He has not been able play tennis because of this.  He has constant pain in his shoulder.    Review of Systems  Constitutional:  Negative.   All other systems reviewed and are negative.    Objective: Vital Signs: There were no vitals taken for this visit.  Physical Exam  Constitutional: He is oriented to person, place, and time. He appears well-developed and well-nourished.  Pulmonary/Chest: Effort normal.  Abdominal: Soft.  Neurological: He is alert and oriented to person, place, and time.  Skin: Skin is warm.  Psychiatric: He has a normal mood and affect. His behavior is normal. Judgment and thought content normal.  Nursing note and vitals reviewed.   Ortho Exam Right shoulder exam shows pain with super spinatus testing.  Positive cross abduction.  Positive impingement. Specialty Comments:  No specialty comments available.  Imaging: No results found.   PMFS History: Patient Active Problem List   Diagnosis Date Noted  . Personal history of gastric ulcer 02/09/2017   History reviewed. No pertinent past medical history.  History reviewed. No pertinent family history.  History reviewed. No pertinent surgical history. Social History   Occupational History  . Not on file  Tobacco Use  . Smoking status: Never Smoker  . Smokeless tobacco: Never Used  Substance and Sexual Activity  . Alcohol use: No  . Drug use: No  . Sexual activity: Not on file

## 2017-07-25 ENCOUNTER — Telehealth (INDEPENDENT_AMBULATORY_CARE_PROVIDER_SITE_OTHER): Payer: Self-pay | Admitting: Orthopaedic Surgery

## 2017-07-25 NOTE — Telephone Encounter (Signed)
Patient needing to schedule surgery for Dr. Erlinda Hong, has left multiple voicemails. CB # [phone]

## 2017-07-27 NOTE — Telephone Encounter (Signed)
I called yesterday and left voice mail for return call.

## 2017-07-30 ENCOUNTER — Encounter (INDEPENDENT_AMBULATORY_CARE_PROVIDER_SITE_OTHER): Payer: Self-pay | Admitting: Orthopaedic Surgery

## 2017-07-30 ENCOUNTER — Encounter: Payer: Self-pay | Admitting: Family Medicine

## 2017-08-02 NOTE — Telephone Encounter (Signed)
Spoke with pt in office today and scheduled surgery.

## 2017-08-03 ENCOUNTER — Other Ambulatory Visit: Payer: Self-pay

## 2017-08-03 ENCOUNTER — Encounter (HOSPITAL_BASED_OUTPATIENT_CLINIC_OR_DEPARTMENT_OTHER): Payer: Self-pay | Admitting: *Deleted

## 2017-08-03 ENCOUNTER — Other Ambulatory Visit (INDEPENDENT_AMBULATORY_CARE_PROVIDER_SITE_OTHER): Payer: Self-pay | Admitting: Orthopaedic Surgery

## 2017-08-03 DIAGNOSIS — M75101 Unspecified rotator cuff tear or rupture of right shoulder, not specified as traumatic: Secondary | ICD-10-CM

## 2017-08-05 ENCOUNTER — Ambulatory Visit (HOSPITAL_BASED_OUTPATIENT_CLINIC_OR_DEPARTMENT_OTHER)
Admission: RE | Admit: 2017-08-05 | Discharge: 2017-08-05 | Disposition: A | Discharge status: Home / Self Care | Payer: Self-pay | Source: Ambulatory Visit | Attending: Orthopaedic Surgery | Admitting: Orthopaedic Surgery

## 2017-08-05 ENCOUNTER — Encounter (HOSPITAL_BASED_OUTPATIENT_CLINIC_OR_DEPARTMENT_OTHER): Payer: Self-pay | Admitting: Anesthesiology

## 2017-08-05 ENCOUNTER — Ambulatory Visit (HOSPITAL_BASED_OUTPATIENT_CLINIC_OR_DEPARTMENT_OTHER): Payer: Self-pay | Admitting: Anesthesiology

## 2017-08-05 ENCOUNTER — Encounter (HOSPITAL_BASED_OUTPATIENT_CLINIC_OR_DEPARTMENT_OTHER)
Admission: RE | Disposition: A | Discharge status: Home / Self Care | Payer: Self-pay | Source: Ambulatory Visit | Attending: Orthopaedic Surgery

## 2017-08-05 DIAGNOSIS — M75101 Unspecified rotator cuff tear or rupture of right shoulder, not specified as traumatic: Secondary | ICD-10-CM | POA: Insufficient documentation

## 2017-08-05 DIAGNOSIS — M75121 Complete rotator cuff tear or rupture of right shoulder, not specified as traumatic: Secondary | ICD-10-CM

## 2017-08-05 DIAGNOSIS — M19011 Primary osteoarthritis, right shoulder: Secondary | ICD-10-CM | POA: Insufficient documentation

## 2017-08-05 DIAGNOSIS — M7541 Impingement syndrome of right shoulder: Secondary | ICD-10-CM

## 2017-08-05 DIAGNOSIS — M659 Synovitis and tenosynovitis, unspecified: Secondary | ICD-10-CM | POA: Insufficient documentation

## 2017-08-05 HISTORY — DX: Elevated prostate specific antigen (PSA): R97.20

## 2017-08-05 HISTORY — DX: Unspecified disorder of synovium and tendon, right shoulder: M67.911

## 2017-08-05 HISTORY — PX: SHOULDER ARTHROSCOPY WITH ROTATOR CUFF REPAIR AND SUBACROMIAL DECOMPRESSION: SHX5686

## 2017-08-05 SURGERY — SHOULDER ARTHROSCOPY WITH ROTATOR CUFF REPAIR AND SUBACROMIAL DECOMPRESSION
Anesthesia: General | Site: Shoulder | Laterality: Right

## 2017-08-05 MED ORDER — SENNOSIDES-DOCUSATE SODIUM 8.6-50 MG PO TABS: 1.0000 | ORAL_TABLET | Freq: Every evening | ORAL | 1 refills | Status: DC | PRN

## 2017-08-05 MED ORDER — OXYCODONE-ACETAMINOPHEN 5-325 MG PO TABS: 1.0000 | ORAL_TABLET | ORAL | 0 refills | Status: DC | PRN

## 2017-08-05 MED ORDER — PROMETHAZINE HCL 25 MG PO TABS: 25.0000 mg | ORAL_TABLET | Freq: Four times a day (QID) | ORAL | 1 refills | Status: DC | PRN

## 2017-08-05 MED ORDER — PHENYLEPHRINE HCL 10 MG/ML IJ SOLN
INTRAMUSCULAR | Status: DC | PRN
  Administered 2017-08-05: 25 ug/min via INTRAVENOUS

## 2017-08-05 MED ORDER — CEFAZOLIN SODIUM-DEXTROSE 2-4 GM/100ML-% IV SOLN
INTRAVENOUS | Status: AC
  Filled 2017-08-05: qty 100

## 2017-08-05 MED ORDER — ONDANSETRON HCL 4 MG/2ML IJ SOLN
INTRAMUSCULAR | Status: AC
  Filled 2017-08-05: qty 2

## 2017-08-05 MED ORDER — PROPOFOL 500 MG/50ML IV EMUL
INTRAVENOUS | Status: AC
  Filled 2017-08-05: qty 50

## 2017-08-05 MED ORDER — GLYCOPYRROLATE 0.2 MG/ML IJ SOLN
INTRAMUSCULAR | Status: DC | PRN
  Administered 2017-08-05: 0.2 mg via INTRAVENOUS

## 2017-08-05 MED ORDER — BUPIVACAINE-EPINEPHRINE (PF) 0.5% -1:200000 IJ SOLN
INTRAMUSCULAR | Status: DC | PRN
  Administered 2017-08-05: 20 mL via PERINEURAL

## 2017-08-05 MED ORDER — FENTANYL CITRATE (PF) 100 MCG/2ML IJ SOLN
INTRAMUSCULAR | Status: AC
  Filled 2017-08-05: qty 2

## 2017-08-05 MED ORDER — FENTANYL CITRATE (PF) 100 MCG/2ML IJ SOLN
INTRAMUSCULAR | Status: DC | PRN
  Administered 2017-08-05: 100 ug via INTRAVENOUS

## 2017-08-05 MED ORDER — FENTANYL CITRATE (PF) 100 MCG/2ML IJ SOLN
50.0000 ug | INTRAMUSCULAR | Status: DC | PRN
  Administered 2017-08-05: 50 ug via INTRAVENOUS

## 2017-08-05 MED ORDER — PHENYLEPHRINE 40 MCG/ML (10ML) SYRINGE FOR IV PUSH (FOR BLOOD PRESSURE SUPPORT)
PREFILLED_SYRINGE | INTRAVENOUS | Status: AC
  Filled 2017-08-05: qty 10

## 2017-08-05 MED ORDER — LIDOCAINE HCL (CARDIAC) 20 MG/ML IV SOLN
INTRAVENOUS | Status: DC | PRN
  Administered 2017-08-05: 50 mg via INTRAVENOUS

## 2017-08-05 MED ORDER — PHENYLEPHRINE HCL 10 MG/ML IJ SOLN
INTRAMUSCULAR | Status: AC
  Filled 2017-08-05: qty 1

## 2017-08-05 MED ORDER — PROPOFOL 10 MG/ML IV BOLUS
INTRAVENOUS | Status: DC | PRN
  Administered 2017-08-05: 150 mg via INTRAVENOUS
  Administered 2017-08-05 (×2): 20 mg via INTRAVENOUS

## 2017-08-05 MED ORDER — LACTATED RINGERS IV SOLN
INTRAVENOUS | Status: DC
  Administered 2017-08-05: 12:00:00 via INTRAVENOUS

## 2017-08-05 MED ORDER — OXYCODONE HCL ER 10 MG PO T12A: 10.0000 mg | EXTENDED_RELEASE_TABLET | Freq: Two times a day (BID) | ORAL | 0 refills | Status: DC

## 2017-08-05 MED ORDER — MIDAZOLAM HCL 5 MG/5ML IJ SOLN
INTRAMUSCULAR | Status: DC | PRN
  Administered 2017-08-05: 2 mg via INTRAVENOUS

## 2017-08-05 MED ORDER — DEXAMETHASONE SODIUM PHOSPHATE 4 MG/ML IJ SOLN
INTRAMUSCULAR | Status: DC | PRN
  Administered 2017-08-05: 10 mg via INTRAVENOUS

## 2017-08-05 MED ORDER — ONDANSETRON HCL 4 MG/2ML IJ SOLN
INTRAMUSCULAR | Status: DC | PRN
  Administered 2017-08-05: 4 mg via INTRAVENOUS

## 2017-08-05 MED ORDER — MIDAZOLAM HCL 2 MG/2ML IJ SOLN
1.0000 mg | INTRAMUSCULAR | Status: DC | PRN
  Administered 2017-08-05: 2 mg via INTRAVENOUS

## 2017-08-05 MED ORDER — LIDOCAINE 2% (20 MG/ML) 5 ML SYRINGE
INTRAMUSCULAR | Status: AC
  Filled 2017-08-05: qty 10

## 2017-08-05 MED ORDER — CEFAZOLIN SODIUM-DEXTROSE 2-4 GM/100ML-% IV SOLN
2.0000 g | INTRAVENOUS | Status: AC
  Administered 2017-08-05: 2 g via INTRAVENOUS

## 2017-08-05 MED ORDER — MIDAZOLAM HCL 2 MG/2ML IJ SOLN
INTRAMUSCULAR | Status: AC
  Filled 2017-08-05: qty 2

## 2017-08-05 MED ORDER — PROPOFOL 10 MG/ML IV BOLUS
INTRAVENOUS | Status: AC
  Filled 2017-08-05: qty 20

## 2017-08-05 MED ORDER — DEXAMETHASONE SODIUM PHOSPHATE 10 MG/ML IJ SOLN
INTRAMUSCULAR | Status: AC
  Filled 2017-08-05: qty 1

## 2017-08-05 MED ORDER — ARTIFICIAL TEARS OPHTHALMIC OINT
TOPICAL_OINTMENT | OPHTHALMIC | Status: AC
  Filled 2017-08-05: qty 3.5

## 2017-08-05 MED ORDER — ONDANSETRON HCL 4 MG PO TABS: 4.0000 mg | ORAL_TABLET | Freq: Three times a day (TID) | ORAL | 0 refills | Status: DC | PRN

## 2017-08-05 MED ORDER — BUPIVACAINE-EPINEPHRINE 0.25% -1:200000 IJ SOLN
INTRAMUSCULAR | Status: DC | PRN
  Administered 2017-08-05: 20 mL

## 2017-08-05 MED ORDER — TIZANIDINE HCL 4 MG PO TABS: 4.0000 mg | ORAL_TABLET | Freq: Four times a day (QID) | ORAL | 2 refills | Status: DC | PRN

## 2017-08-05 MED ORDER — LACTATED RINGERS IV SOLN
INTRAVENOUS | Status: DC | PRN
  Administered 2017-08-05 (×2): via INTRAVENOUS

## 2017-08-05 MED ORDER — SUCCINYLCHOLINE CHLORIDE 20 MG/ML IJ SOLN
INTRAMUSCULAR | Status: DC | PRN
  Administered 2017-08-05: 75 mg via INTRAVENOUS

## 2017-08-05 MED ORDER — SCOPOLAMINE 1 MG/3DAYS TD PT72: 1.0000 | MEDICATED_PATCH | Freq: Once | TRANSDERMAL | Status: DC | PRN

## 2017-08-05 MED FILL — ?ONDANSETRON HCL 4MG TABLET: 4 | 6 days supply | Qty: 40 | Fill #0

## 2017-08-05 MED FILL — PROMETHAZINE 25 MG TABLET: 25 | 10 days supply | Qty: 30 | Fill #0

## 2017-08-05 MED FILL — oxyCODONE HCL ER 10 MG T12A: 10 | 5 days supply | Qty: 10 | Fill #0

## 2017-08-05 MED FILL — OXYCODONE-ACETAMINOPHEN 5-3: 5-325 | 3 days supply | Qty: 30 | Fill #0

## 2017-08-05 MED FILL — tiZANidine HCL 4 MG TABS: 4 | 8 days supply | Qty: 30 | Fill #0

## 2017-08-05 SURGICAL SUPPLY — 63 items
ANCHOR BIOCOMP SWIVELOCK (Anchor) ×2 IMPLANT
BENZOIN TINCTURE PRP APPL 2/3 (GAUZE/BANDAGES/DRESSINGS) IMPLANT
BLADE 4.2CUDA (BLADE) ×2 IMPLANT
BLADE CUTTER GATOR 3.5 (BLADE) IMPLANT
BLADE GREAT WHITE 4.2 (BLADE) IMPLANT
BLADE SURG 15 STRL LF DISP TIS (BLADE) IMPLANT
BLADE SURG 15 STRL SS (BLADE)
BUR OVAL 4.0 (BURR) ×2 IMPLANT
CANNULA 5.75X71 LONG (CANNULA) ×2 IMPLANT
CANNULA TWIST IN 8.25X7CM (CANNULA) ×2 IMPLANT
CANNULA TWIST IN 8.25X9CM (CANNULA) IMPLANT
CLSR STERI-STRIP ANTIMIC 1/2X4 (GAUZE/BANDAGES/DRESSINGS) IMPLANT
DECANTER SPIKE VIAL GLASS SM (MISCELLANEOUS) IMPLANT
DRAPE IMP U-DRAPE 54X76 (DRAPES) ×2 IMPLANT
DRAPE INCISE IOBAN 66X45 STRL (DRAPES) ×2 IMPLANT
DRAPE STERI 35X30 U-POUCH (DRAPES) ×2 IMPLANT
DRAPE SURG 17X23 STRL (DRAPES) ×2 IMPLANT
DRAPE U-SHAPE 47X51 STRL (DRAPES) ×4 IMPLANT
DRAPE U-SHAPE 76X120 STRL (DRAPES) ×4 IMPLANT
DRSG PAD ABDOMINAL 8X10 ST (GAUZE/BANDAGES/DRESSINGS) ×4 IMPLANT
DURAPREP 26ML APPLICATOR (WOUND CARE) ×4 IMPLANT
ELECT REM PT RETURN 9FT ADLT (ELECTROSURGICAL)
ELECTRODE REM PT RTRN 9FT ADLT (ELECTROSURGICAL) IMPLANT
GAUZE SPONGE 4X4 12PLY STRL (GAUZE/BANDAGES/DRESSINGS) ×2 IMPLANT
GAUZE XEROFORM 1X8 LF (GAUZE/BANDAGES/DRESSINGS) ×2 IMPLANT
GLOVE BIO SURGEON STRL SZ 6.5 (GLOVE) ×2 IMPLANT
GLOVE BIOGEL PI IND STRL 7.0 (GLOVE) ×2 IMPLANT
GLOVE BIOGEL PI INDICATOR 7.0 (GLOVE) ×2
GLOVE SKINSENSE NS SZ7.5 (GLOVE) ×1
GLOVE SKINSENSE STRL SZ7.5 (GLOVE) ×1 IMPLANT
GLOVE SURG SYN 7.5  E (GLOVE) ×1
GLOVE SURG SYN 7.5 E (GLOVE) ×1 IMPLANT
GOWN STRL REIN XL XLG (GOWN DISPOSABLE) ×2 IMPLANT
GOWN STRL REUS W/ TWL LRG LVL3 (GOWN DISPOSABLE) ×1 IMPLANT
GOWN STRL REUS W/TWL LRG LVL3 (GOWN DISPOSABLE) ×1
IMMOBILIZER SHDR XL LX WHT (SOFTGOODS) ×2 IMPLANT
IMMOBILIZER SHOULDER FOAM XLGE (SOFTGOODS) IMPLANT
KIT SHOULDER TRACTION (DRAPES) ×2 IMPLANT
MANIFOLD NEPTUNE II (INSTRUMENTS) ×2 IMPLANT
NEEDLE SCORPION MULTI FIRE (NEEDLE) ×2 IMPLANT
PACK ARTHROSCOPY DSU (CUSTOM PROCEDURE TRAY) ×2 IMPLANT
PACK BASIN DAY SURGERY FS (CUSTOM PROCEDURE TRAY) ×2 IMPLANT
PROBE BIPOLAR ATHRO 135MM 90D (MISCELLANEOUS) ×2 IMPLANT
SET ARTHROSCOPY TUBING (MISCELLANEOUS) ×1
SET ARTHROSCOPY TUBING LN (MISCELLANEOUS) ×1 IMPLANT
SHEET MEDIUM DRAPE 40X70 STRL (DRAPES) ×2 IMPLANT
SLEEVE SCD COMPRESS KNEE MED (MISCELLANEOUS) ×2 IMPLANT
SLING ARM FOAM STRAP LRG (SOFTGOODS) IMPLANT
SLING ARM IMMOBILIZER LRG (SOFTGOODS) IMPLANT
SLING ARM IMMOBILIZER MED (SOFTGOODS) IMPLANT
SLING ARM MED ADULT FOAM STRAP (SOFTGOODS) IMPLANT
SLING ARM XL FOAM STRAP (SOFTGOODS) IMPLANT
SUT ETHILON 3 0 PS 1 (SUTURE) ×2 IMPLANT
SUT FIBERWIRE #2 38 T-5 BLUE (SUTURE)
SUT TIGER TAPE 7 IN WHITE (SUTURE) ×2 IMPLANT
SUTURE FIBERWR #2 38 T-5 BLUE (SUTURE) IMPLANT
SYR 50ML LL SCALE MARK (SYRINGE) IMPLANT
TAPE CLOTH SURG 6X10 WHT LF (GAUZE/BANDAGES/DRESSINGS) ×2 IMPLANT
TAPE FIBER 2MM 7IN #2 BLUE (SUTURE) IMPLANT
TOWEL OR 17X24 6PK STRL BLUE (TOWEL DISPOSABLE) ×2 IMPLANT
TOWEL OR NON WOVEN STRL DISP B (DISPOSABLE) ×2 IMPLANT
TUBE CONNECTING 20X1/4 (TUBING) IMPLANT
WATER STERILE IRR 1000ML POUR (IV SOLUTION) ×2 IMPLANT

## 2017-08-05 NOTE — Transfer of Care (Signed)
Immediate Anesthesia Transfer of Care Note  Patient: Roy Reed  Procedure(s) Performed: RIGHT SHOULDER ARTHROSCOPY WITH ROTATOR CUFF REPAIR, DISTAL CLAVICLE EXCISION, DEBRIDEMENT AND SUBACROMIAL DECOMPRESSION (Right Shoulder)  Patient Location: PACU  Anesthesia Type:GA combined with regional for post-op pain  Level of Consciousness: awake and patient cooperative  Airway & Oxygen Therapy: Patient Spontanous Breathing and Patient connected to face mask oxygen  Post-op Assessment: Report given to RN and Post -op Vital signs reviewed and stable  Post vital signs: Reviewed and stable  Last Vitals:  Vitals:   08/05/17 1220 08/05/17 1225  BP:    Pulse: 90 98  Resp: 16 18  Temp:    SpO2: 99% 96%    Last Pain:  Vitals:   08/05/17 1142  TempSrc: Oral  PainSc: 0-No pain         Complications: No apparent anesthesia complications

## 2017-08-05 NOTE — Discharge Instructions (Signed)
Post-operative patient instructions  Shoulder Arthroscopy and rotator cuff repair   Ice:  Place intermittent ice or cooler pack over your shoulder, 30 minutes on and 30 minutes off.  Continue this for the first 72 hours after surgery, then save ice for use after therapy sessions or on more active days.    Weight:  You may NOT bear weight on your arm or lift your arm away from the side of your body.  Dressing:  Perform 1st dressing change at 2 days postoperative. A moderate amount of blood tinged drainage is to be expected.  So if you bleed through the dressing on the first or second day or if you have fevers, it is fine to change the dressing/check the wounds early and redress wound.  If it bleeds through again, or if the incisions are leaking frank blood, please call the office. May change dressing every 1-2 days thereafter to help watch wounds. Can purchase Tegaderm (or 62M Nexcare) water resistant dressings at local pharmacy / [facility].  Shower:  Light shower is ok after 2 days.  Please take shower, NO bath. Recover with gauze and ace wrap to help keep wounds protected.    Pain medication:  A narcotic pain medication has been prescribed.  Take as directed.  Typically you need narcotic pain medication more regularly during the first 3 to 5 days after surgery.  Decrease your use of the medication as the pain improves.  Narcotics can sometimes cause constipation, even after a few doses.  If you have problems with constipation, you can take an over the counter stool softener or light laxative.  If you have persistent problems, please notify your physicians office.  Physical therapy: Additional activity guidelines to be provided by your physician or physical therapist at follow-up visits.   Driving: Do not recommend driving x 2 weeks post surgical, especially if surgery performed on right side. Should not drive while taking narcotic pain medications. It typically takes at least 2 weeks to restore  sufficient neuromuscular function for normal reaction times for driving safety.   Call [phone] for questions or problems. Evenings you will be forwarded to the hospital operator.  Ask for the orthopaedic physician on call. Please call if you experience:    o Redness, foul smelling, or persistent drainage from the surgical site  o worsening shoulder pain and swelling not responsive to medication  o any calf pain and or swelling of the lower leg  o temperatures greater than 101.5 F o other questions or concerns   Thank you for allowing [name] to be a part of your care.   Regional Anesthesia Blocks  1. Numbness or the inability to move the "blocked" extremity may last from 3-48 hours after placement. The length of time depends on the medication injected and your individual response to the medication. If the numbness is not going away after 48 hours, call your surgeon.  2. The extremity that is blocked will need to be protected until the numbness is gone and the  Strength has returned. Because you cannot feel it, you will need to take extra care to avoid injury. Because it may be weak, you may have difficulty moving it or using it. You may not know what position it is in without looking at it while the block is in effect.  3. For blocks in the legs and feet, returning to weight bearing and walking needs to be done carefully. You will need to wait until the numbness is  entirely gone and the strength has returned. You should be able to move your leg and foot normally before you try and bear weight or walk. You will need someone to be with you when you first try to ensure you do not fall and possibly risk injury.  4. Bruising and tenderness at the needle site are common side effects and will resolve in a few days.  5. Persistent numbness or new problems with movement should be communicated to the surgeon or the [place] [phone] [place]  [phone]).   Post Anesthesia Home Care Instructions  Activity: Get plenty of rest for the remainder of the day. A responsible individual must stay with you for 24 hours following the procedure.  For the next 24 hours, DO NOT: -Drive a car -Paediatric nurse -Drink alcoholic beverages -Take any medication unless instructed by your physician -Make any legal decisions or sign important papers.  Meals: Start with liquid foods such as gelatin or soup. Progress to regular foods as tolerated. Avoid greasy, spicy, heavy foods. If nausea and/or vomiting occur, drink only clear liquids until the nausea and/or vomiting subsides. Call your physician if vomiting continues.  Special Instructions/Symptoms: Your throat may feel dry or sore from the anesthesia or the breathing tube placed in your throat during surgery. If this causes discomfort, gargle with warm salt water. The discomfort should disappear within 24 hours.  If you had a scopolamine patch placed behind your ear for the management of post- operative nausea and/or vomiting:  1. The medication in the patch is effective for 72 hours, after which it should be removed.  Wrap patch in a tissue and discard in the trash. Wash hands thoroughly with soap and water. 2. You may remove the patch earlier than 72 hours if you experience unpleasant side effects which may include dry mouth, dizziness or visual disturbances. 3. Avoid touching the patch. Wash your hands with soap and water after contact with the patch.

## 2017-08-05 NOTE — Anesthesia Procedure Notes (Signed)
Procedure Name: Intubation Date/Time: 08/05/2017 12:38 PM Performed by: Marrianne Mood, CRNA Pre-anesthesia Checklist: Patient identified, Emergency Drugs available, Suction available, Patient being monitored and Timeout performed Patient Re-evaluated:Patient Re-evaluated prior to induction Oxygen Delivery Method: Circle system utilized Preoxygenation: Pre-oxygenation with 100% oxygen Induction Type: IV induction Ventilation: Mask ventilation without difficulty Laryngoscope Size: Glidescope and 3 Grade View: Grade II Tube type: Oral Number of attempts: 1 Airway Equipment and Method: Stylet and Oral airway Placement Confirmation: ETT inserted through vocal cords under direct vision,  positive ETCO2 and breath sounds checked- equal and bilateral Secured at: 22 cm Tube secured with: Tape Dental Injury: Teeth and Oropharynx as per pre-operative assessment  Difficulty Due To: Difficulty was anticipated, Difficult Airway- due to large tongue, Difficult Airway- due to reduced neck mobility and Difficult Airway- due to limited oral opening

## 2017-08-05 NOTE — H&P (Signed)
    PREOPERATIVE H&P  Chief Complaint: right rotator cuff tear, impingement  HPI: Roy Reed is a 61 y.o. male who presents for surgical treatment of right rotator cuff tear, impingement.  He denies any changes in medical history.  Past Medical History:  Diagnosis Date  . Elevated PSA   . Rotator cuff disorder, right    Past Surgical History:  Procedure Laterality Date  . BACK SURGERY     Social History   Socioeconomic History  . Marital status: Married    Spouse name: None  . Number of children: None  . Years of education: None  . Highest education level: None  Social Needs  . Financial resource strain: None  . Food insecurity - worry: None  . Food insecurity - inability: None  . Transportation needs - medical: None  . Transportation needs - non-medical: None  Occupational History  . None  Tobacco Use  . Smoking status: Never Smoker  . Smokeless tobacco: Never Used  Substance and Sexual Activity  . Alcohol use: Yes    Comment: social  . Drug use: No  . Sexual activity: None  Other Topics Concern  . None  Social History Narrative  . None   History reviewed. No pertinent family history. Allergies  Allergen Reactions  . Aspirin     States he can take if he takes acid reducer medicine first   Prior to Admission medications   Medication Sig Start Date End Date Taking? Authorizing Provider  naproxen (NAPROSYN) 500 MG tablet Take 1 tablet (500 mg total) by mouth 2 (two) times daily with a meal. X 10 days then prn pain 06/01/17  Yes McClung, Angela M, PA-C  traMADol (ULTRAM) 50 MG tablet Take 1-2 tablets (50-100 mg total) 3 (three) times daily as needed by mouth. 07/12/17  Yes Leandrew Koyanagi, MD  acetaminophen (TYLENOL) 500 MG tablet Take 2 tablets (1,000 mg total) by mouth every 6 (six) hours as needed for moderate pain. 02/28/17   Alfonse Spruce, FNP     Positive ROS: All other systems have been reviewed and were otherwise negative with the exception of  those mentioned in the HPI and as above.  Physical Exam: General: Alert, no acute distress Cardiovascular: No pedal edema Respiratory: No cyanosis, no use of accessory musculature GI: abdomen soft Skin: No lesions in the area of chief complaint Neurologic: Sensation intact distally Psychiatric: Patient is competent for consent with normal mood and affect Lymphatic: no lymphedema  MUSCULOSKELETAL: exam stable  Assessment: right rotator cuff tear, impingement  Plan: Plan for Procedure(s): RIGHT SHOULDER ARTHROSCOPY WITH ROTATOR CUFF REPAIR, DISTAL CLAVICLE EXCISION, DEBRIDEMENT AND SUBACROMIAL DECOMPRESSION  The risks benefits and alternatives were discussed with the patient including but not limited to the risks of nonoperative treatment, versus surgical intervention including infection, bleeding, nerve injury,  blood clots, cardiopulmonary complications, morbidity, mortality, among others, and they were willing to proceed.   Eduard Roux, MD   08/05/2017 12:10 PM

## 2017-08-05 NOTE — Op Note (Signed)
Date of Surgery: 01/14/2016  INDICATIONS: The patient is a 61 y.o.-year-old male with right shoulder pain that has failed conservative treatment;  The patient did consent to the procedure after discussion of the risks and benefits.  PREOPERATIVE DIAGNOSIS:  1.  Right full-thickness supraspinatus tear 2.  Right shoulder impingement syndrome 3.  Right shoulder AC joint arthrosis 4.  Right type III acromion  POSTOPERATIVE DIAGNOSIS: Same.  PROCEDURE:  1.  Arthroscopic right shoulder rotator cuff repair of supraspinatus tendon 2.  Arthroscopic right distal clavicle excision 3.  Arthroscopic right shoulder extensive debridement 4.  Arthroscopic right shoulder subacromial decompression with acromioplasty and CA ligament release  SURGEON: N. Eduard Roux, M.D.  ASSIST: None.  ANESTHESIA:  general, regional  IV FLUIDS AND URINE: See anesthesia.  ESTIMATED BLOOD LOSS: minimal mL.  IMPLANTS: Arthrex 4.75 mm self punching anchor  COMPLICATIONS: None.  DESCRIPTION OF PROCEDURE: The patient was brought to the operating room and placed supine on the operating table.  The patient had been signed prior to the procedure and this was documented. The patient had the anesthesia placed by the anesthesiologist.  A time-out was performed to confirm that this was the correct patient, site, side and location. The patient did receive antibiotics prior to the incision and was re-dosed during the procedure as needed at indicated intervals.  The patient was then moved into the lateral position with the operative extremity suspended in the fishing pole mechanism. The patient had the operative extremity prepped and draped in the standard surgical fashion.    The posterior and anterior arthroscopy portals were established.  With the portals in place we first performed a diagnostic arthroscopy of the shoulder joint.  Patient had mild thinning and softening of the cartilage without any focal defects.  Patient did  have a degenerative posterior labral tear from the 11 o'clock position to the 8 o'clock position.  This was debrided using an oscillating shaver back to a stable and bleeding tissue.  The biceps anchor was overall intact but slightly degenerative which was also debrided.  There was mild synovitis within the rotator interval which was also debrided.  There was mild tendinosis of the subscapularis tendon which was also debrided.  After this was done we then were able to identify the full-thickness crescent-shaped supraspinatus tear from the shoulder joint.  The arthroscope was then repositioned into the subacromial space.  Subacromial and subdeltoid bursa were excised.  An acromioplasty with a CA ligament release was then performed.  The [facility] joint was also then identified and a distal clavicle excision was performed using a high-speed bur.  We then gently debrided the supraspinatus tendon back to viable tissue.  We were able to identify the crescent-shaped tear from the subacromial space.  The tear had good mobilization back to the footprint.  The repair site was then prepared using a high-speed bur down to bleeding bone.  The supraspinatus tear was then repaired using a single row with a single self punching 4.75 mm anchor.  The supraspinatus tendon demonstrated good excursion and this tacked down well to the humeral head.  Final pictures were taken.  The excess fluid was then drained.  The incisions were closed with 3-0 nylon sutures.  Sterile dressings were applied.  Shoulder was placed in a shoulder immobilizer.  Patient tolerated procedure well had no immediate complications.  POSTOPERATIVE PLAN: Patient will remain in the sling until follow-up in 2 weeks.  Azucena Cecil, MD [place] 10:16 AM

## 2017-08-05 NOTE — Anesthesia Preprocedure Evaluation (Addendum)
Anesthesia Evaluation  Patient identified by MRN, date of birth, ID band Patient awake    Reviewed: Allergy & Precautions, NPO status , Patient's Chart, lab work & pertinent test results  Airway Mallampati: II  TM Distance: >3 FB Neck ROM: Full    Dental no notable dental hx. (+) Dental Advisory Given, Chipped,    Pulmonary neg pulmonary ROS,    Pulmonary exam normal breath sounds clear to auscultation       Cardiovascular negative cardio ROS Normal cardiovascular exam Rhythm:Regular Rate:Normal     Neuro/Psych negative neurological ROS  negative psych ROS   GI/Hepatic negative GI ROS, Neg liver ROS,   Endo/Other  negative endocrine ROS  Renal/GU negative Renal ROS     Musculoskeletal negative musculoskeletal ROS (+)   Abdominal   Peds  Hematology negative hematology ROS (+)   Anesthesia Other Findings   Reproductive/Obstetrics negative OB ROS                            Anesthesia Physical Anesthesia Plan  ASA: II  Anesthesia Plan: General   Post-op Pain Management: GA combined w/ Regional for post-op pain   Induction: Intravenous  PONV Risk Score and Plan: 2 and Ondansetron, Dexamethasone and Midazolam  Airway Management Planned: Oral ETT  Additional Equipment:   Intra-op Plan:   Post-operative Plan: Extubation in OR  Informed Consent: I have reviewed the patients History and Physical, chart, labs and discussed the procedure including the risks, benefits and alternatives for the proposed anesthesia with the patient or authorized representative who has indicated his/her understanding and acceptance.   Dental advisory given  Plan Discussed with: CRNA  Anesthesia Plan Comments:        Anesthesia Quick Evaluation

## 2017-08-05 NOTE — Progress Notes (Signed)
AssistedDr. Hollis with right, ultrasound guided, interscalene  block. Side rails up, monitors on throughout procedure. See vital signs in flow sheet. Tolerated Procedure well.  

## 2017-08-05 NOTE — Anesthesia Procedure Notes (Addendum)
Anesthesia Regional Block: Interscalene brachial plexus block   Pre-Anesthetic Checklist: ,, timeout performed, Correct Patient, Correct Site, Correct Laterality, Correct Procedure, Correct Position, site marked, Risks and benefits discussed,  Surgical consent,  Pre-op evaluation,  At surgeon's request and post-op pain management  Laterality: Right  Prep: chloraprep       Needles:  Injection technique: Single-shot  Needle Type: Echogenic Needle     Needle Length: 9cm  Needle Gauge: 21     Additional Needles:   Procedures:,,,, ultrasound used (permanent image in chart),,,,  Narrative:  Start time: 08/05/2017 12:05 PM End time: 08/05/2017 12:15 PM Injection made incrementally with aspirations every 5 mL.  Performed by: Personally  Anesthesiologist: Effie Berkshire, MD  Additional Notes: Patient tolerated the procedure well. Local anesthetic introduced in an incremental fashion under minimal resistance after negative aspirations. No paresthesias were elicited. After completion of the procedure, no acute issues were identified and patient continued to be monitored by RN.

## 2017-08-06 NOTE — Anesthesia Postprocedure Evaluation (Signed)
Anesthesia Post Note  Patient: Roy Reed  Procedure(s) Performed: RIGHT SHOULDER ARTHROSCOPY WITH ROTATOR CUFF REPAIR, DISTAL CLAVICLE EXCISION, DEBRIDEMENT AND SUBACROMIAL DECOMPRESSION (Right Shoulder)     Patient location during evaluation: PACU Anesthesia Type: General and Regional Level of consciousness: awake and alert Pain management: pain level controlled Vital Signs Assessment: post-procedure vital signs reviewed and stable Respiratory status: spontaneous breathing, nonlabored ventilation, respiratory function stable and patient connected to nasal cannula oxygen Cardiovascular status: blood pressure returned to baseline and stable Postop Assessment: no apparent nausea or vomiting Anesthetic complications: no    Last Vitals:  Vitals:   08/05/17 1515 08/05/17 1530  BP: 118/68 (!) 131/92  Pulse: 88 88  Resp: 14 18  Temp:  36.5 C  SpO2: 95% 95%    Last Pain:  Vitals:   08/05/17 1530  TempSrc: Oral  PainSc: 0-No pain                 Effie Berkshire

## 2017-08-08 ENCOUNTER — Encounter (HOSPITAL_BASED_OUTPATIENT_CLINIC_OR_DEPARTMENT_OTHER): Payer: Self-pay | Admitting: Orthopaedic Surgery

## 2017-08-10 ENCOUNTER — Telehealth (INDEPENDENT_AMBULATORY_CARE_PROVIDER_SITE_OTHER): Payer: Self-pay | Admitting: Orthopaedic Surgery

## 2017-08-10 NOTE — Telephone Encounter (Signed)
Patient just recently had surgery with Dr. Erlinda Hong, he has his 2 week post op on the 14th but he called asking what his ristrictions were as in taking a shower, mobility, etc.. CB # [phone]

## 2017-08-10 NOTE — Telephone Encounter (Signed)
Patient still had further questions than what was in the chart, can you please call patient back when you get the chance? CB # [phone]

## 2017-08-10 NOTE — Telephone Encounter (Signed)
Tried to call patient no answer LMOM to return call to advise on message below.

## 2017-08-10 NOTE — Telephone Encounter (Signed)
See message below. Any restrictions?

## 2017-08-10 NOTE — Telephone Encounter (Signed)
Non weight bearing.  Cannot move arm away from body.  May take off sling to do elbow ROM.  May take off sling to shower.

## 2017-08-10 NOTE — Telephone Encounter (Signed)
Tried to call patient  No answer.LMOM- Advised he needs to be specific on what questions he has in order to get him answers quickly.

## 2017-08-12 MED FILL — tiZANidine HCL 4 MG TABS: 4 | 8 days supply | Qty: 30 | Fill #1

## 2017-08-19 ENCOUNTER — Encounter (INDEPENDENT_AMBULATORY_CARE_PROVIDER_SITE_OTHER): Payer: Self-pay | Admitting: Orthopaedic Surgery

## 2017-08-19 ENCOUNTER — Ambulatory Visit (INDEPENDENT_AMBULATORY_CARE_PROVIDER_SITE_OTHER): Payer: Self-pay | Admitting: Orthopaedic Surgery

## 2017-08-19 DIAGNOSIS — M7541 Impingement syndrome of right shoulder: Secondary | ICD-10-CM

## 2017-08-19 DIAGNOSIS — M75101 Unspecified rotator cuff tear or rupture of right shoulder, not specified as traumatic: Secondary | ICD-10-CM

## 2017-08-19 NOTE — Progress Notes (Signed)
Patient is two-week status post right shoulder arthroscopy with rotator cuff repair.  He is doing well.  Pain is well controlled.  Not taking any painkillers at this point.  The sutures were removed today.  The incisions are healed.  Begin physical therapy for medium size tear.  Arthroscopy pictures were reviewed with the patient.  Follow-up in 4 weeks for recheck.  We will decide likely to proceed with his wife's wrist surgery at that time.

## 2017-08-22 ENCOUNTER — Ambulatory Visit: Payer: Self-pay | Attending: Orthopaedic Surgery | Admitting: Physical Therapy

## 2017-08-22 DIAGNOSIS — R600 Localized edema: Secondary | ICD-10-CM | POA: Insufficient documentation

## 2017-08-22 DIAGNOSIS — M25611 Stiffness of right shoulder, not elsewhere classified: Secondary | ICD-10-CM | POA: Insufficient documentation

## 2017-08-22 DIAGNOSIS — M6281 Muscle weakness (generalized): Secondary | ICD-10-CM | POA: Insufficient documentation

## 2017-08-22 DIAGNOSIS — M25511 Pain in right shoulder: Secondary | ICD-10-CM | POA: Insufficient documentation

## 2017-08-22 NOTE — Therapy (Signed)
[place], [place], [address] Phone: [phone]   Fax:  [phone]  Physical Therapy Evaluation  Patient Details  Name: Roy Reed MRN: [id number] Date of Birth: [birth year] Referring Provider: Dr Frankey Shown    Encounter Date: 08/22/2017  PT End of Session - 08/22/17 1213    Visit Number  1    Number of Visits  16    Date for PT Re-Evaluation  11/14/17    Authorization Type  CAFA     PT Start Time  0847    PT Stop Time  0940    PT Time Calculation (min)  53 min    Activity Tolerance  Patient tolerated treatment well    Behavior During Therapy  [facility] for tasks assessed/performed       Past Medical History:  Diagnosis Date  . Elevated PSA   . Rotator cuff disorder, right     Past Surgical History:  Procedure Laterality Date  . BACK SURGERY    . SHOULDER ARTHROSCOPY WITH ROTATOR CUFF REPAIR AND SUBACROMIAL DECOMPRESSION Right 08/05/2017   Procedure: RIGHT SHOULDER ARTHROSCOPY WITH ROTATOR CUFF REPAIR, DISTAL CLAVICLE EXCISION, DEBRIDEMENT AND SUBACROMIAL DECOMPRESSION;  Surgeon: Leandrew Koyanagi, MD;  Location: [place];  Service: Orthopedics;  Laterality: Right;    There were no vitals filed for this visit.   Subjective Assessment - 08/22/17 0858    Subjective  Patient had a right rotator cuff repair on 08/05/2018. He reports pain with movement of the shoulder. the patient was advised not to move the shoulder and to keep it in his sling.      Limitations  Lifting;Writing;House hold activities    Patient Stated Goals  to return to tennis     Currently in Pain?  Yes    Pain Score  3  Pain can get higher if he moves it    Pain Location  Shoulder    Pain Orientation  Right;Anterior;Posterior    Pain Descriptors / Indicators  Aching    Pain Type  Surgical pain    Pain Radiating Towards  Pain can radiate into his bicep at times     Pain Onset  1 to 4 weeks ago    Pain Frequency   Constant    Aggravating Factors   trying to use the arm     Pain Relieving Factors  rest     Effect of Pain on Daily Activities  difficulty perfroming daily tasks with dominant hand          [facility] PT Assessment - 08/22/17 0001      Assessment   Medical Diagnosis  Right Shoulder rotator cuff repair     Referring Provider  Dr Frankey Shown     Onset Date/Surgical Date  08/05/17    Hand Dominance  Right    Next MD Visit  4 weeks     Prior Therapy  None       Precautions   Precautions  Shoulder    Precaution Comments  Follow SOS protocol     Required Braces or Orthoses  Sling      Balance Screen   Has the patient fallen in the past 6 months  No    Has the patient had a decrease in activity level because of a fear of falling?   No    Is the patient reluctant to leave their home because of a fear of falling?   No  Home Environment   Additional Comments  nothinh significant       Prior Function   Level of Independence  Independent    Vocation  Retired    Leisure  Air cabin crew   Overall Cognitive Status  Within Functional Limits for tasks assessed    Attention  Focused    Focused Attention  Appears intact    Memory  Appears intact    Awareness  Appears intact    Problem Solving  Appears intact      Observation/Other Assessments   Skin Integrity  surgical site well healed     Focus on Therapeutic Outcomes (FOTO)   speakes french       Sensation   Additional Comments  denies parathesias       Posture/Postural Control   Posture/Postural Control  No significant limitations      ROM / Strength   AROM / PROM / Strength  AROM;Strength;PROM      AROM   Overall AROM Comments  Not tested 2nd to recent surgery and protocol       PROM   PROM Assessment Site  Shoulder    Right/Left Shoulder  Right    Right Shoulder Flexion  78 Degrees    Right Shoulder ABduction  60 Degrees    Right Shoulder External Rotation  4 Degrees      Strength   Overall Strength  Comments  Not tested 2nd to recent surery and protocol       Palpation   Palpation comment  Tender to palpation in the anterior shoulder              Objective measurements completed on examination: See above findings.      [facility] PT Treatment/Exercise - 08/22/17 0001      Shoulder Exercises: Standing   Other Standing Exercises  pendulums x10 each way; towel squeeze       Wrist Exercises   Other wrist exercises  wrist flexion/ extension x10    Other wrist exercises  towel squeeze x10       Manual Therapy   Manual therapy comments  PROM into flexion/ ER/ IR              PT Education - 08/22/17 1213    Education provided  Yes    Education Details  improtance of sling use; HEP    Person(s) Educated  Patient    Methods  Explanation;Demonstration;Tactile cues;Verbal cues    Comprehension  Verbalized understanding;Returned demonstration;Verbal cues required;Tactile cues required       PT Short Term Goals - 08/22/17 1221      PT SHORT TERM GOAL #1   Title  Patient will increase passive right ER to 30 degrees     Time  3    Period  Weeks    Status  New    Target Date  09/12/17      PT SHORT TERM GOAL #2   Title  Patient will increase rightpassive shoulder flexion to 90 degrees     Time  3    Period  Weeks    Status  New    Target Date  09/12/17      PT SHORT TERM GOAL #3   Title  Patient will be independent with initial HEP     Time  3    Period  Weeks    Status  New    Target Date  09/12/17  PT SHORT TERM GOAL #4   Title  Patient will demostrate 3/5 right shoulder flexion strength below 90 degrees     Time  4    Period  Weeks    Status  New    Target Date  09/19/17        PT Long Term Goals - 08/22/17 1224      PT LONG TERM GOAL #1   Title  Patient will demonstrate full left shoulder ER in order to reach behind his head     Time  12    Period  Weeks    Status  New    Target Date  11/14/17      PT LONG TERM GOAL #2   Title   Patient will reach behind his back with the right arm to L1 without pain in order to perform ADL's     Time  12    Period  Weeks    Status  New    Target Date  11/14/17      PT LONG TERM GOAL #3   Title  Patient will reach overhad into a cabinet with a 2lb weight in order to perfrom ADL's     Time  12    Period  Weeks    Status  New    Target Date  11/14/17      PT LONG TERM GOAL #4   Title  Patient will be independent with program that will progress him towards return to tennis     Time  12    Period  Weeks    Status  New    Target Date  11/14/17             Plan - 08/22/17 1214    Clinical Impression Statement  Patient is a 61 year old male S/P right RTC repair and DCE on 07/26/2017. Per script therapy will follow a medium RTC repair protocol. He presents with expected limitations in motion and strength. He would benefit from skilled therapy to improve ability to use his dominant arm. He would like to return to tennis.      History and Personal Factors relevant to plan of care:  lumbar surgery     Clinical Presentation  Stable    Clinical Decision Making  Low    PT Frequency  2x / week    PT Duration  8 weeks    PT Treatment/Interventions  ADLs/Self Care Home Management;Cryotherapy;Electrical Stimulation;Iontophoresis 4mg /ml Dexamethasone;Gait training;Stair training;Therapeutic activities;Therapeutic exercise;Neuromuscular re-education;Passive range of motion    PT Next Visit Plan  continue PROM; review pendulums; begin AAROM at week 5; add scap retraction     PT Home Exercise Plan  pendulums; wrist flexion/ ext; towel squeeze;     Consulted and Agree with Plan of Care  Patient       Patient will benefit from skilled therapeutic intervention in order to improve the following deficits and impairments:  Pain, Impaired UE functional use, Decreased safety awareness, Difficulty walking  Visit Diagnosis: Stiffness of right shoulder, not elsewhere classified  Acute pain of  right shoulder  Muscle weakness (generalized)  Localized edema     Problem List Patient Active Problem List   Diagnosis Date Noted  . Personal history of gastric ulcer 02/09/2017    Carney Living PT DPT  08/22/2017, 12:28 PM  [facility] [address] [place], [place], [address] Phone: [phone]   Fax:  [phone]  Name: Roy Reed MRN: [id number]  Date of Birth: Nov 13, 1955

## 2017-08-25 ENCOUNTER — Encounter: Payer: Self-pay | Admitting: Physical Therapy

## 2017-08-25 ENCOUNTER — Ambulatory Visit: Payer: Self-pay | Admitting: Physical Therapy

## 2017-08-25 DIAGNOSIS — M6281 Muscle weakness (generalized): Secondary | ICD-10-CM

## 2017-08-25 DIAGNOSIS — M25611 Stiffness of right shoulder, not elsewhere classified: Secondary | ICD-10-CM

## 2017-08-25 DIAGNOSIS — R600 Localized edema: Secondary | ICD-10-CM

## 2017-08-25 DIAGNOSIS — M25511 Pain in right shoulder: Secondary | ICD-10-CM

## 2017-08-25 NOTE — Therapy (Signed)
[place], [place], [address] Phone: [phone]   Fax:  [phone]  Physical Therapy Treatment  Patient Details  Name: Roy Reed MRN: [id number] Date of Birth: 06-20-1956 Referring Provider: Dr Frankey Shown    Encounter Date: 08/25/2017  PT End of Session - 08/25/17 1523    Visit Number  2    Number of Visits  16    Date for PT Re-Evaluation  11/14/17    Authorization Type  CAFA     PT Start Time  1500    PT Stop Time  1543    PT Time Calculation (min)  43 min    Activity Tolerance  Patient tolerated treatment well    Behavior During Therapy  [facility] for tasks assessed/performed       Past Medical History:  Diagnosis Date  . Elevated PSA   . Rotator cuff disorder, right     Past Surgical History:  Procedure Laterality Date  . BACK SURGERY    . SHOULDER ARTHROSCOPY WITH ROTATOR CUFF REPAIR AND SUBACROMIAL DECOMPRESSION Right 08/05/2017   Procedure: RIGHT SHOULDER ARTHROSCOPY WITH ROTATOR CUFF REPAIR, DISTAL CLAVICLE EXCISION, DEBRIDEMENT AND SUBACROMIAL DECOMPRESSION;  Surgeon: Leandrew Koyanagi, MD;  Location: [place];  Service: Orthopedics;  Laterality: Right;    There were no vitals filed for this visit.  Subjective Assessment - 08/25/17 1506    Subjective  Pain is well controlled at this time. He has been working on his exercises at home.     Limitations  Lifting;Writing;House hold activities    Patient Stated Goals  to return to tennis     Currently in Pain?  Yes    Pain Score  3     Pain Location  Shoulder    Pain Orientation  Right;Left;Anterior    Pain Descriptors / Indicators  Aching    Pain Type  Surgical pain    Pain Onset  1 to 4 weeks ago    Pain Frequency  Constant    Aggravating Factors   use of the arm     Pain Relieving Factors  rest    Effect of Pain on Daily Activities  difficulty perfroming daily tasks          [facility] PT Assessment - 08/25/17 0001       PROM   Right Shoulder Flexion  100 Degrees    Right Shoulder ABduction  85 Degrees    Right Shoulder External Rotation  25 Degrees                  [facility] Adult PT Treatment/Exercise - 08/25/17 0001      Shoulder Exercises: Seated   Other Seated Exercises  seated scap retractions 2x10       Shoulder Exercises: Standing   Other Standing Exercises  pendulums x10 each way; towel squeeze       Manual Therapy   Manual therapy comments  PROM into flexion/ ER/ IR              PT Education - 08/25/17 1516    Education provided  Yes    Education Details  reviewed tehcnique with pednulums     Person(s) Educated  Patient    Methods  Demonstration;Explanation;Verbal cues;Tactile cues    Comprehension  Verbalized understanding;Returned demonstration       PT Short Term Goals - 08/22/17 1221      PT SHORT TERM GOAL #1   Title  Patient will  increase passive right ER to 30 degrees     Time  3    Period  Weeks    Status  New    Target Date  09/12/17      PT SHORT TERM GOAL #2   Title  Patient will increase rightpassive shoulder flexion to 90 degrees     Time  3    Period  Weeks    Status  New    Target Date  09/12/17      PT SHORT TERM GOAL #3   Title  Patient will be independent with initial HEP     Time  3    Period  Weeks    Status  New    Target Date  09/12/17      PT SHORT TERM GOAL #4   Title  Patient will demostrate 3/5 right shoulder flexion strength below 90 degrees     Time  4    Period  Weeks    Status  New    Target Date  09/19/17        PT Long Term Goals - 08/22/17 1224      PT LONG TERM GOAL #1   Title  Patient will demonstrate full left shoulder ER in order to reach behind his head     Time  12    Period  Weeks    Status  New    Target Date  11/14/17      PT LONG TERM GOAL #2   Title  Patient will reach behind his back with the right arm to L1 without pain in order to perform ADL's     Time  12    Period  Weeks    Status   New    Target Date  11/14/17      PT LONG TERM GOAL #3   Title  Patient will reach overhad into a cabinet with a 2lb weight in order to perfrom ADL's     Time  12    Period  Weeks    Status  New    Target Date  11/14/17      PT LONG TERM GOAL #4   Title  Patient will be independent with program that will progress him towards return to tennis     Time  12    Period  Weeks    Status  New    Target Date  11/14/17            Plan - 08/25/17 1627    Clinical Impression Statement  Patient mad egood improvements today. He  demonstrated improved shoulder flexion and external rotation passivly. Therapy gave him scap retraction to work on at home. He demsotrated good technique with no increase in pain with scap retraction. Therapy will continue to progress patient per large RTC protocol.     Clinical Presentation  Stable    Clinical Decision Making  Low    Rehab Potential  Good    PT Frequency  2x / week    PT Duration  8 weeks    PT Treatment/Interventions  ADLs/Self Care Home Management;Cryotherapy;Electrical Stimulation;Iontophoresis 4mg /ml Dexamethasone;Gait training;Stair training;Therapeutic activities;Therapeutic exercise;Neuromuscular re-education;Passive range of motion    PT Next Visit Plan  continue PROM; review pendulums; begin AAROM at week 5; add scap retraction; on script it says medicum RTC tear but protovcol onyl has large and small     PT Home Exercise Plan  pendulums; wrist flexion/ ext; towel squeeze;  Consulted and Agree with Plan of Care  Patient       Patient will benefit from skilled therapeutic intervention in order to improve the following deficits and impairments:  Pain, Impaired UE functional use, Decreased safety awareness, Difficulty walking  Visit Diagnosis: Stiffness of right shoulder, not elsewhere classified  Acute pain of right shoulder  Muscle weakness (generalized)  Localized edema     Problem List Patient Active Problem List    Diagnosis Date Noted  . Personal history of gastric ulcer 02/09/2017    Carney Living PT DPT  08/25/2017, 4:40 PM  [facility] [address] [place], [place], [address] Phone: [phone]   Fax:  [phone]  Name: Roy Reed MRN: [id number] Date of Birth: [birth year]

## 2017-08-26 MED FILL — tiZANidine HCL 4 MG TABS: 4 | 8 days supply | Qty: 30 | Fill #2

## 2017-08-31 ENCOUNTER — Encounter: Payer: Self-pay | Admitting: Family Medicine

## 2017-08-31 ENCOUNTER — Ambulatory Visit: Payer: Self-pay | Attending: Family Medicine

## 2017-08-31 ENCOUNTER — Ambulatory Visit: Payer: Self-pay | Attending: Family Medicine | Admitting: Family Medicine

## 2017-08-31 VITALS — BP 127/77 | HR 96 | Temp 98.3°F | Ht 74.0 in | Wt 236.0 lb

## 2017-08-31 DIAGNOSIS — M75101 Unspecified rotator cuff tear or rupture of right shoulder, not specified as traumatic: Secondary | ICD-10-CM | POA: Insufficient documentation

## 2017-08-31 DIAGNOSIS — Z79899 Other long term (current) drug therapy: Secondary | ICD-10-CM | POA: Insufficient documentation

## 2017-08-31 DIAGNOSIS — M12811 Other specific arthropathies, not elsewhere classified, right shoulder: Secondary | ICD-10-CM

## 2017-08-31 DIAGNOSIS — Z09 Encounter for follow-up examination after completed treatment for conditions other than malignant neoplasm: Secondary | ICD-10-CM

## 2017-08-31 DIAGNOSIS — Z1211 Encounter for screening for malignant neoplasm of colon: Secondary | ICD-10-CM | POA: Insufficient documentation

## 2017-08-31 DIAGNOSIS — Z23 Encounter for immunization: Secondary | ICD-10-CM | POA: Insufficient documentation

## 2017-08-31 DIAGNOSIS — R7303 Prediabetes: Secondary | ICD-10-CM | POA: Insufficient documentation

## 2017-08-31 DIAGNOSIS — R972 Elevated prostate specific antigen [PSA]: Secondary | ICD-10-CM | POA: Insufficient documentation

## 2017-08-31 HISTORY — DX: Unspecified rotator cuff tear or rupture of right shoulder, not specified as traumatic: M75.101

## 2017-08-31 HISTORY — DX: Other specific arthropathies, not elsewhere classified, right shoulder: M12.811

## 2017-08-31 LAB — POCT GLYCOSYLATED HEMOGLOBIN (HGB A1C): HEMOGLOBIN A1C: 5.9

## 2017-08-31 NOTE — Progress Notes (Signed)
Subjective:  Patient ID: Roy Reed, male    DOB: [birth year]  Age: 61 y.o. MRN: [id number]  CC: Follow-up   HPI Roy Reed presents for follow up. Interpreter services used. History of surgical repair of right rotator cuff. He reports upcoming follow up with his orthopedic specialist in Jan. He is receiving PT, he reports plans for a total of 7 session then re-evaluation from specialist. He reports taking Zanaflex only for pain. History of prediabetes he denies any symptoms. History of elevated PSA. He reports following up with urologist and is awaiting biopsy results.   Outpatient Medications Prior to Visit  Medication Sig Dispense Refill  . acetaminophen (TYLENOL) 500 MG tablet Take 2 tablets (1,000 mg total) by mouth every 6 (six) hours as needed for moderate pain. (Patient not taking: Reported on 08/31/2017) 30 tablet 0  . naproxen (NAPROSYN) 500 MG tablet Take 1 tablet (500 mg total) by mouth 2 (two) times daily with a meal. X 10 days then prn pain (Patient not taking: Reported on 08/31/2017) 60 tablet 0  . ondansetron (ZOFRAN) 4 MG tablet Take 1-2 tablets (4-8 mg total) by mouth every 8 (eight) hours as needed for nausea or vomiting. (Patient not taking: Reported on 08/31/2017) 40 tablet 0  . oxyCODONE (OXYCONTIN) 10 mg 12 hr tablet Take 1 tablet (10 mg total) by mouth every 12 (twelve) hours. (Patient not taking: Reported on 08/31/2017) 10 tablet 0  . oxyCODONE-acetaminophen (PERCOCET) 5-325 MG tablet Take 1-2 tablets by mouth every 4 (four) hours as needed for severe pain. (Patient not taking: Reported on 08/31/2017) 30 tablet 0  . promethazine (PHENERGAN) 25 MG tablet Take 1 tablet (25 mg total) by mouth every 6 (six) hours as needed for nausea. (Patient not taking: Reported on 08/31/2017) 30 tablet 1  . senna-docusate (SENOKOT S) 8.6-50 MG tablet Take 1 tablet by mouth at bedtime as needed. (Patient not taking: Reported on 08/31/2017) 30 tablet 1  . tiZANidine (ZANAFLEX) 4 MG  tablet Take 1 tablet (4 mg total) by mouth every 6 (six) hours as needed for muscle spasms. (Patient not taking: Reported on 08/31/2017) 30 tablet 2  . traMADol (ULTRAM) 50 MG tablet Take 1-2 tablets (50-100 mg total) 3 (three) times daily as needed by mouth. (Patient not taking: Reported on 08/31/2017) 30 tablet 2   No facility-administered medications prior to visit.     ROS Review of Systems  Constitutional: Negative.   Respiratory: Negative.   Cardiovascular: Negative.   Musculoskeletal: Negative for arthralgias and myalgias.  Skin: Negative.    Objective:  BP 127/77   Pulse 96   Temp 98.3 F (36.8 C) (Oral)   Ht 6\' 2"  (1.88 m)   Wt 236 lb (107 kg)   SpO2 98%   BMI 30.30 kg/m   BP/Weight 08/31/2017 08/05/2017 58/05/9832  Systolic BP 825 053 976  Diastolic BP 77 92 90  Wt. (Lbs) 236 232.25 -  BMI 30.3 31.5 -   Physical Exam  Constitutional: He appears well-developed and well-nourished.  Cardiovascular: Normal rate, regular rhythm, normal heart sounds and intact distal pulses.  Pulmonary/Chest: Effort normal and breath sounds normal.  Musculoskeletal:       Right shoulder: He exhibits no pain and normal strength.       Right hand: Normal strength noted.       Left hand: Normal strength noted.  Nursing note and vitals reviewed.   Assessment & Plan:   1. Follow up Follow up with orthopedic specialist. Continue PT  Follow up with urologist specialist regarding elevated PSA/biospy.   2. Rotator cuff tear arthropathy of right shoulder Follow up with orthopedic specialist. Continue PT  3. Flu vaccine need  - Flu Vaccine QUAD 6+ mos PF IM (Fluarix Quad PF)  4. Prediabetes Encouraged dietary changes and increase physical activity. - POCT glycosylated hemoglobin (Hb A1C)      Follow-up: Return in about 3 months (around 11/29/2017) for Prediabetes.   Alfonse Spruce FNP

## 2017-08-31 NOTE — Patient Instructions (Addendum)

## 2017-09-01 ENCOUNTER — Ambulatory Visit: Payer: Self-pay | Admitting: Physical Therapy

## 2017-09-01 ENCOUNTER — Encounter: Payer: Self-pay | Admitting: Physical Therapy

## 2017-09-01 DIAGNOSIS — M25611 Stiffness of right shoulder, not elsewhere classified: Secondary | ICD-10-CM

## 2017-09-01 DIAGNOSIS — M6281 Muscle weakness (generalized): Secondary | ICD-10-CM

## 2017-09-01 DIAGNOSIS — M25511 Pain in right shoulder: Secondary | ICD-10-CM

## 2017-09-01 DIAGNOSIS — R600 Localized edema: Secondary | ICD-10-CM

## 2017-09-01 NOTE — Therapy (Signed)
[place], [place], [address] Phone: [phone]   Fax:  [phone]  Physical Therapy Treatment  Patient Details  Name: Roy Reed MRN: [id number] Date of Birth: 1955-11-15 Referring Provider: Dr Frankey Shown    Encounter Date: 09/01/2017  PT End of Session - 09/01/17 1539    Visit Number  3    Number of Visits  16    Date for PT Re-Evaluation  11/14/17    Authorization Type  CAFA     PT Start Time  0325 10 minutes late    PT Stop Time  0355    PT Time Calculation (min)  30 min       Past Medical History:  Diagnosis Date  . Elevated PSA   . Rotator cuff disorder, right     Past Surgical History:  Procedure Laterality Date  . BACK SURGERY    . SHOULDER ARTHROSCOPY WITH ROTATOR CUFF REPAIR AND SUBACROMIAL DECOMPRESSION Right 08/05/2017   Procedure: RIGHT SHOULDER ARTHROSCOPY WITH ROTATOR CUFF REPAIR, DISTAL CLAVICLE EXCISION, DEBRIDEMENT AND SUBACROMIAL DECOMPRESSION;  Surgeon: Leandrew Koyanagi, MD;  Location: [facility];  Service: Orthopedics;  Laterality: Right;    There were no vitals filed for this visit.  Subjective Assessment - 09/01/17 1529    Subjective  No pain, only if I lay down or take it out of the sling.     Currently in Pain?  Yes    Pain Score  3     Pain Orientation  Right    Pain Type  Surgical pain    Aggravating Factors   taking of sling, laying down    Pain Relieving Factors  sling         [facility] PT Assessment - 09/01/17 0001      PROM   Right Shoulder Flexion  118 Degrees    Right Shoulder ABduction  85 Degrees    Right Shoulder External Rotation  25 Degrees                  [facility] Adult PT Treatment/Exercise - 09/01/17 0001      Shoulder Exercises: Supine   Other Supine Exercises  clasped hand flexion pullovers, ER AAROM with wand       Shoulder Exercises: Seated   Other Seated Exercises  standing scap retractions 2x10     Other  Seated Exercises  table slides, roll chair back 5 x 10 sec       Manual Therapy   Manual therapy comments  PROM into flexion/ ER/ IR                PT Short Term Goals - 08/22/17 1221      PT SHORT TERM GOAL #1   Title  Patient will increase passive right ER to 30 degrees     Time  3    Period  Weeks    Status  New    Target Date  09/12/17      PT SHORT TERM GOAL #2   Title  Patient will increase rightpassive shoulder flexion to 90 degrees     Time  3    Period  Weeks    Status  New    Target Date  09/12/17      PT SHORT TERM GOAL #3   Title  Patient will be independent with initial HEP     Time  3    Period  Weeks    Status  New    Target Date  09/12/17      PT SHORT TERM GOAL #4   Title  Patient will demostrate 3/5 right shoulder flexion strength below 90 degrees     Time  4    Period  Weeks    Status  New    Target Date  09/19/17        PT Long Term Goals - 08/22/17 1224      PT LONG TERM GOAL #1   Title  Patient will demonstrate full left shoulder ER in order to reach behind his head     Time  12    Period  Weeks    Status  New    Target Date  11/14/17      PT LONG TERM GOAL #2   Title  Patient will reach behind his back with the right arm to L1 without pain in order to perform ADL's     Time  12    Period  Weeks    Status  New    Target Date  11/14/17      PT LONG TERM GOAL #3   Title  Patient will reach overhad into a cabinet with a 2lb weight in order to perfrom ADL's     Time  12    Period  Weeks    Status  New    Target Date  11/14/17      PT LONG TERM GOAL #4   Title  Patient will be independent with program that will progress him towards return to tennis     Time  12    Period  Weeks    Status  New    Target Date  11/14/17            Plan - 09/01/17 1543    Clinical Impression Statement  Improved PROM flexion. Began AAROM per POC at 5 weeks post op. Instructed in supine AAROM for flexion and ER, also table slides.  Updated HEP. Pt declined ice at end of session. Coutioned to not cause too much pain with AAROM exercises.     PT Next Visit Plan  continue PROM; review pendulums; begin AAROM at week 5; add scap retraction; on script it says medicum RTC tear but protocol onyl has large and small     PT Home Exercise Plan  pendulums; wrist flexion/ ext; towel squeeze; scap squeeze, table slides, clasped hand flexion, ER with cane     Consulted and Agree with Plan of Care  Patient       Patient will benefit from skilled therapeutic intervention in order to improve the following deficits and impairments:  Pain, Impaired UE functional use, Decreased safety awareness, Difficulty walking  Visit Diagnosis: Stiffness of right shoulder, not elsewhere classified  Acute pain of right shoulder  Muscle weakness (generalized)  Localized edema     Problem List Patient Active Problem List   Diagnosis Date Noted  . Rotator cuff tear arthropathy of right shoulder 08/31/2017  . Follow up 08/31/2017  . Personal history of gastric ulcer 02/09/2017    Dorene Ar, PTA 09/01/2017, 4:21 PM  [facility] [address] [place], [place], [address] Phone: [phone]   Fax:  [phone]  Name: Roy Reed MRN: [id number] Date of Birth: 1956-07-08

## 2017-09-07 ENCOUNTER — Other Ambulatory Visit (HOSPITAL_COMMUNITY)
Admission: RE | Admit: 2017-09-07 | Discharge: 2017-09-07 | Disposition: A | Discharge status: Home / Self Care | Payer: Self-pay | Source: Ambulatory Visit | Attending: Urology | Admitting: Urology

## 2017-09-07 ENCOUNTER — Ambulatory Visit (INDEPENDENT_AMBULATORY_CARE_PROVIDER_SITE_OTHER): Payer: Self-pay | Admitting: Urology

## 2017-09-07 ENCOUNTER — Other Ambulatory Visit: Payer: Self-pay | Admitting: Urology

## 2017-09-07 DIAGNOSIS — C61 Malignant neoplasm of prostate: Secondary | ICD-10-CM

## 2017-09-07 LAB — BASIC METABOLIC PANEL
ANION GAP: 12 (ref 5–15)
BUN: 18 mg/dL (ref 6–20)
CALCIUM: 9.7 mg/dL (ref 8.9–10.3)
CHLORIDE: 100 mmol/L — AB (ref 101–111)
CO2: 28 mmol/L (ref 22–32)
Creatinine, Ser: 1.28 mg/dL — ABNORMAL HIGH (ref 0.61–1.24)
GFR calc non Af Amer: 58 mL/min — ABNORMAL LOW (ref >60)
Glucose, Bld: 84 mg/dL (ref 65–99)
Potassium: 4.2 mmol/L (ref 3.5–5.1)
SODIUM: 140 mmol/L (ref 135–145)

## 2017-09-08 ENCOUNTER — Ambulatory Visit: Payer: Self-pay | Attending: Orthopaedic Surgery | Admitting: Physical Therapy

## 2017-09-08 ENCOUNTER — Encounter: Payer: Self-pay | Admitting: Physical Therapy

## 2017-09-08 DIAGNOSIS — M25611 Stiffness of right shoulder, not elsewhere classified: Secondary | ICD-10-CM | POA: Insufficient documentation

## 2017-09-08 DIAGNOSIS — M25511 Pain in right shoulder: Secondary | ICD-10-CM | POA: Insufficient documentation

## 2017-09-08 DIAGNOSIS — M6281 Muscle weakness (generalized): Secondary | ICD-10-CM | POA: Insufficient documentation

## 2017-09-08 DIAGNOSIS — R600 Localized edema: Secondary | ICD-10-CM | POA: Insufficient documentation

## 2017-09-08 NOTE — Therapy (Signed)
[place], [place], [address] Phone: [phone]   Fax:  [phone]  Physical Therapy Treatment  Patient Details  Name: Roy Reed MRN: [id number] Date of Birth: [birth year] Referring Provider: Dr Frankey Shown    Encounter Date: 09/08/2017  PT End of Session - 09/08/17 1735    Visit Number  4    Number of Visits  16    Date for PT Re-Evaluation  11/14/17    Authorization Type  CAFA     PT Start Time  1415    PT Stop Time  1458    PT Time Calculation (min)  43 min    Activity Tolerance  Patient tolerated treatment well    Behavior During Therapy  [facility] for tasks assessed/performed       Past Medical History:  Diagnosis Date  . Elevated PSA   . Rotator cuff disorder, right     Past Surgical History:  Procedure Laterality Date  . BACK SURGERY    . SHOULDER ARTHROSCOPY WITH ROTATOR CUFF REPAIR AND SUBACROMIAL DECOMPRESSION Right 08/05/2017   Procedure: RIGHT SHOULDER ARTHROSCOPY WITH ROTATOR CUFF REPAIR, DISTAL CLAVICLE EXCISION, DEBRIDEMENT AND SUBACROMIAL DECOMPRESSION;  Surgeon: Leandrew Koyanagi, MD;  Location: [facility];  Service: Orthopedics;  Laterality: Right;    There were no vitals filed for this visit.  Subjective Assessment - 09/08/17 1419    Subjective  Patient reports her arm has felt pretty good. He will see the doctor next week.     Limitations  Lifting;Writing;House hold activities    Patient Stated Goals  to return to tennis     Currently in Pain?  Yes    Pain Score  4     Pain Location  Shoulder    Pain Orientation  Right    Pain Descriptors / Indicators  Aching    Pain Type  Surgical pain    Pain Onset  1 to 4 weeks ago    Pain Frequency  Constant    Aggravating Factors   taking off the sling and lying doiwn    Pain Relieving Factors  sling     Effect of Pain on Daily Activities  difficulty perfroming daily tasks          [facility] PT Assessment - 09/08/17  0001      PROM   Right Shoulder Flexion  128 Degrees    Right Shoulder ABduction  88 Degrees    Right Shoulder External Rotation  32 Degrees                  [facility] Adult PT Treatment/Exercise - 09/08/17 0001      Shoulder Exercises: Supine   Other Supine Exercises  clasped hand flexion pullovers, ER AAROM with wand       Shoulder Exercises: Seated   Other Seated Exercises  standing scap retractions 2x10     Other Seated Exercises  pulleys 2 min tsable slides x10       Manual Therapy   Manual Therapy  Joint mobilization;Passive ROM    Manual therapy comments  PROM into flexion/ ER/ IR     Joint Mobilization  PA glides and inferior gides to improve external rotation              PT Education - 09/08/17 1422    Education provided  Yes    Education Details  reviewed HEP     Person(s) Educated  Patient    Methods  Explanation;Demonstration;Tactile cues;Verbal cues    Comprehension  Verbal cues required;Tactile cues required;Returned demonstration;Verbalized understanding       PT Short Term Goals - 09/08/17 1445      PT SHORT TERM GOAL #1   Title  Patient will increase passive right ER to 30 degrees     Baseline  30 degrees today     Time  3    Period  Weeks    Status  Achieved      PT SHORT TERM GOAL #2   Title  Patient will increase rightpassive shoulder flexion to 90 degrees     Baseline  130     Time  3    Period  Weeks    Status  Achieved      PT SHORT TERM GOAL #3   Title  Patient will be independent with initial HEP     Baseline  perfroming at home with cuing     Time  3    Period  Weeks    Status  On-going      PT SHORT TERM GOAL #4   Title  Patient will demostrate 3/5 right shoulder flexion strength below 90 degrees     Time  4    Period  Weeks    Status  Achieved        PT Long Term Goals - 08/22/17 1224      PT LONG TERM GOAL #1   Title  Patient will demonstrate full left shoulder ER in order to reach behind his head     Time   12    Period  Weeks    Status  New    Target Date  11/14/17      PT LONG TERM GOAL #2   Title  Patient will reach behind his back with the right arm to L1 without pain in order to perform ADL's     Time  12    Period  Weeks    Status  New    Target Date  11/14/17      PT LONG TERM GOAL #3   Title  Patient will reach overhad into a cabinet with a 2lb weight in order to perfrom ADL's     Time  12    Period  Weeks    Status  New    Target Date  11/14/17      PT LONG TERM GOAL #4   Title  Patient will be independent with program that will progress him towards return to tennis     Time  12    Period  Weeks    Status  New    Target Date  11/14/17            Plan - 09/08/17 1440    Clinical Impression Statement  Therapy added AAROM for ER and added pulleys. Education provided on the improtance of not over stretching into ER. Therapy reviewed his table slides. The patient did well. All passive motions have improved. See assessemnt section. Consdier isomterics next visit.     Clinical Presentation  Stable    Clinical Decision Making  Low    Rehab Potential  Good    PT Frequency  2x / week    PT Duration  8 weeks    PT Treatment/Interventions  ADLs/Self Care Home Management;Cryotherapy;Electrical Stimulation;Iontophoresis 4mg /ml Dexamethasone;Gait training;Stair training;Therapeutic activities;Therapeutic exercise;Neuromuscular re-education;Passive range of motion    PT Next Visit Plan  continue PROM; review pendulums; begin AAROM at week  5; add scap retraction; on script it says medicum RTC tear but protocol onyl has large and small; consdier isometrics and adding light band restance to band exercises.     PT Home Exercise Plan  pendulums; wrist flexion/ ext; towel squeeze; scap squeeze, table slides, clasped hand flexion, ER with cane     Consulted and Agree with Plan of Care  Patient       Patient will benefit from skilled therapeutic intervention in order to improve the  following deficits and impairments:  Pain, Impaired UE functional use, Decreased safety awareness, Difficulty walking  Visit Diagnosis: Stiffness of right shoulder, not elsewhere classified  Acute pain of right shoulder  Muscle weakness (generalized)  Localized edema     Problem List Patient Active Problem List   Diagnosis Date Noted  . Rotator cuff tear arthropathy of right shoulder 08/31/2017  . Follow up 08/31/2017  . Personal history of gastric ulcer 02/09/2017    Carney Living PT DPT  09/08/2017, 5:36 PM  [facility] [address] [place], [place], [address] Phone: [phone]   Fax:  [phone]  Name: Roy Reed MRN: [id number] Date of Birth: 1956-08-28

## 2017-09-09 ENCOUNTER — Encounter (HOSPITAL_COMMUNITY): Payer: Self-pay

## 2017-09-09 ENCOUNTER — Encounter (HOSPITAL_COMMUNITY)
Admission: RE | Admit: 2017-09-09 | Discharge: 2017-09-09 | Disposition: A | Discharge status: Home / Self Care | Payer: Self-pay | Source: Ambulatory Visit | Attending: Urology | Admitting: Urology

## 2017-09-09 DIAGNOSIS — C61 Malignant neoplasm of prostate: Secondary | ICD-10-CM | POA: Insufficient documentation

## 2017-09-09 MED ORDER — TECHNETIUM TC 99M MEDRONATE IV KIT
25.0000 | PACK | Freq: Once | INTRAVENOUS | Status: AC | PRN
  Administered 2017-09-09: 25 via INTRAVENOUS

## 2017-09-12 ENCOUNTER — Other Ambulatory Visit: Payer: Self-pay | Admitting: Urology

## 2017-09-12 DIAGNOSIS — C61 Malignant neoplasm of prostate: Secondary | ICD-10-CM

## 2017-09-13 ENCOUNTER — Encounter: Payer: Self-pay | Admitting: Physical Therapy

## 2017-09-13 ENCOUNTER — Ambulatory Visit: Payer: Self-pay | Admitting: Physical Therapy

## 2017-09-13 DIAGNOSIS — M6281 Muscle weakness (generalized): Secondary | ICD-10-CM

## 2017-09-13 DIAGNOSIS — R600 Localized edema: Secondary | ICD-10-CM

## 2017-09-13 DIAGNOSIS — M25611 Stiffness of right shoulder, not elsewhere classified: Secondary | ICD-10-CM

## 2017-09-13 DIAGNOSIS — M25511 Pain in right shoulder: Secondary | ICD-10-CM

## 2017-09-13 NOTE — Therapy (Signed)
[place], [place], [address] Phone: [phone]   Fax:  [phone]  Physical Therapy Treatment  Patient Details  Name: Roy Reed MRN: [id number] Date of Birth: 16-Aug-1956 Referring Provider: Dr Frankey Shown    Encounter Date: 09/13/2017  PT End of Session - 09/13/17 1504    Visit Number  5    Number of Visits  16    Date for PT Re-Evaluation  11/14/17    Authorization Type  CAFA     PT Start Time  0300    PT Stop Time  0400    PT Time Calculation (min)  60 min       Past Medical History:  Diagnosis Date  . Elevated PSA   . Rotator cuff disorder, right     Past Surgical History:  Procedure Laterality Date  . BACK SURGERY    . SHOULDER ARTHROSCOPY WITH ROTATOR CUFF REPAIR AND SUBACROMIAL DECOMPRESSION Right 08/05/2017   Procedure: RIGHT SHOULDER ARTHROSCOPY WITH ROTATOR CUFF REPAIR, DISTAL CLAVICLE EXCISION, DEBRIDEMENT AND SUBACROMIAL DECOMPRESSION;  Surgeon: Leandrew Koyanagi, MD;  Location: [facility];  Service: Orthopedics;  Laterality: Right;    There were no vitals filed for this visit.  Subjective Assessment - 09/13/17 1504    Subjective  A little pain with movement     Currently in Pain?  Yes    Pain Score  2     Pain Location  Shoulder    Pain Orientation  Right    Pain Descriptors / Indicators  Sharp    Aggravating Factors   lifting up    Pain Relieving Factors  sling         [facility] PT Assessment - 09/13/17 0001      PROM   Right Shoulder Flexion  130 Degrees    Right Shoulder ABduction  90 Degrees    Right Shoulder External Rotation  32 Degrees                  [facility] Adult PT Treatment/Exercise - 09/13/17 0001      Shoulder Exercises: Supine   Other Supine Exercises  supine cane pullovers , ER       Shoulder Exercises: Seated   Other Seated Exercises  table slides, roll chair back 5 x 10 sec , seated ER stretch with arm on table       Shoulder Exercises: Standing   Other Standing Exercises  wall ladder x 4 climbs     Other Standing Exercises  bicep curls 2# x 20      Shoulder Exercises: Pulleys   Flexion  2 minutes      Shoulder Exercises: Isometric Strengthening   Flexion Limitations  10 x 5 sec    Extension Limitations  10 x 5 sec    External Rotation Limitations  10 x 5 sec     Internal Rotation Limitations  10 x 5 sec       Modalities   Modalities  Cryotherapy      Cryotherapy   Number Minutes Cryotherapy  15 Minutes    Cryotherapy Location  Shoulder    Type of Cryotherapy  Ice pack      Manual Therapy   Manual therapy comments  PROM into flexion/ ER/ IR /abduction               PT Short Term Goals - 09/08/17 1445      PT SHORT TERM GOAL #1  Title  Patient will increase passive right ER to 30 degrees     Baseline  30 degrees today     Time  3    Period  Weeks    Status  Achieved      PT SHORT TERM GOAL #2   Title  Patient will increase rightpassive shoulder flexion to 90 degrees     Baseline  130     Time  3    Period  Weeks    Status  Achieved      PT SHORT TERM GOAL #3   Title  Patient will be independent with initial HEP     Baseline  perfroming at home with cuing     Time  3    Period  Weeks    Status  On-going      PT SHORT TERM GOAL #4   Title  Patient will demostrate 3/5 right shoulder flexion strength below 90 degrees     Time  4    Period  Weeks    Status  Achieved        PT Long Term Goals - 08/22/17 1224      PT LONG TERM GOAL #1   Title  Patient will demonstrate full left shoulder ER in order to reach behind his head     Time  12    Period  Weeks    Status  New    Target Date  11/14/17      PT LONG TERM GOAL #2   Title  Patient will reach behind his back with the right arm to L1 without pain in order to perform ADL's     Time  12    Period  Weeks    Status  New    Target Date  11/14/17      PT LONG TERM GOAL #3   Title  Patient will reach overhad  into a cabinet with a 2lb weight in order to perfrom ADL's     Time  12    Period  Weeks    Status  New    Target Date  11/14/17      PT LONG TERM GOAL #4   Title  Patient will be independent with program that will progress him towards return to tennis     Time  12    Period  Weeks    Status  New    Target Date  11/14/17            Plan - 09/13/17 1610    Clinical Impression Statement  Focused AAROM, PROM and began isometrics monitoring for pain throughout. PROM improving. Started ER stretch at table. Discussed ice application at home due to him feling swollen. He reports he did not understand that he should be icing at home. Encouraged 3 x per day.     PT Next Visit Plan  continue PROM; review pendulums; begin AAROM at week 5; add scap retraction; on script it says medicum RTC tear but protocol onyl has large and small; review isometrics and adding light band restance to band exercises.     PT Home Exercise Plan  pendulums; wrist flexion/ ext; towel squeeze; scap squeeze, table slides, clasped hand flexion, ER with cane     Consulted and Agree with Plan of Care  Patient       Patient will benefit from skilled therapeutic intervention in order to improve the following deficits and impairments:  Pain, Impaired UE functional use, Decreased  safety awareness, Difficulty walking  Visit Diagnosis: Stiffness of right shoulder, not elsewhere classified  Acute pain of right shoulder  Muscle weakness (generalized)  Localized edema     Problem List Patient Active Problem List   Diagnosis Date Noted  . Rotator cuff tear arthropathy of right shoulder 08/31/2017  . Follow up 08/31/2017  . Personal history of gastric ulcer 02/09/2017    Dorene Ar , PTA 09/13/2017, 5:10 PM  [facility] [address] [place], [place], [address] Phone: [phone]   Fax:  [phone]  Name: Roy Reed MRN: [id number] Date of  Birth: 1956-01-02

## 2017-09-15 ENCOUNTER — Ambulatory Visit (INDEPENDENT_AMBULATORY_CARE_PROVIDER_SITE_OTHER): Payer: Self-pay | Admitting: Orthopaedic Surgery

## 2017-09-15 ENCOUNTER — Ambulatory Visit: Payer: Self-pay | Admitting: Physical Therapy

## 2017-09-15 ENCOUNTER — Other Ambulatory Visit: Payer: Self-pay | Admitting: Family Medicine

## 2017-09-15 ENCOUNTER — Encounter (INDEPENDENT_AMBULATORY_CARE_PROVIDER_SITE_OTHER): Payer: Self-pay | Admitting: Orthopaedic Surgery

## 2017-09-15 DIAGNOSIS — M7541 Impingement syndrome of right shoulder: Secondary | ICD-10-CM

## 2017-09-15 DIAGNOSIS — M75101 Unspecified rotator cuff tear or rupture of right shoulder, not specified as traumatic: Secondary | ICD-10-CM

## 2017-09-15 DIAGNOSIS — C61 Malignant neoplasm of prostate: Secondary | ICD-10-CM

## 2017-09-15 MED ORDER — TIZANIDINE HCL 4 MG PO TABS
4.0000 mg | ORAL_TABLET | Freq: Four times a day (QID) | ORAL | 2 refills | Status: DC | PRN
Start: 2017-09-15 — End: 2017-11-03

## 2017-09-15 MED FILL — tiZANidine HCL 4 MG TABS: 4 | 8 days supply | Qty: 30 | Fill #0

## 2017-09-15 NOTE — Progress Notes (Signed)
Patient ID: Roy Reed, male   DOB: 07/07/1956, 62 y.o.   MRN: [id number]  Patient is 6 weeks status post right shoulder arthroscopy with rotator cuff repair.  He is overall doing well and progressing with physical therapy.  He is doing this twice a week.  He does endorse some trapezius muscular tightness and pain that radiates up into the neck.  Denies any radicular symptoms.  His range of motion is coming along as expected.  His surgical scars are fully healed.  No focal findings.  At this point he can discontinue the sling and progress with rehab protocol.  Prescription for Zanaflex to hopefully help with the muscle tightness.  Questions encouraged and answered.  Follow-up in 6 weeks for recheck.

## 2017-09-20 ENCOUNTER — Encounter: Payer: Self-pay | Admitting: Physical Therapy

## 2017-09-20 ENCOUNTER — Ambulatory Visit: Payer: Self-pay | Admitting: Physical Therapy

## 2017-09-20 DIAGNOSIS — M25511 Pain in right shoulder: Secondary | ICD-10-CM

## 2017-09-20 DIAGNOSIS — R600 Localized edema: Secondary | ICD-10-CM

## 2017-09-20 DIAGNOSIS — M6281 Muscle weakness (generalized): Secondary | ICD-10-CM

## 2017-09-20 DIAGNOSIS — M25611 Stiffness of right shoulder, not elsewhere classified: Secondary | ICD-10-CM

## 2017-09-20 NOTE — Therapy (Signed)
[place], [place], [address] Phone: [phone]   Fax:  [phone]  Physical Therapy Treatment  Patient Details  Name: Roy Reed MRN: [id number] Date of Birth: 1956/03/08 Referring Provider: Dr Frankey Shown    Encounter Date: 09/20/2017  PT End of Session - 09/20/17 1423    Visit Number  6    Number of Visits  16    Date for PT Re-Evaluation  11/14/17    Authorization Type  CAFA     PT Start Time  0217    PT Stop Time  0315    PT Time Calculation (min)  58 min       Past Medical History:  Diagnosis Date  . Elevated PSA   . Rotator cuff disorder, right     Past Surgical History:  Procedure Laterality Date  . BACK SURGERY    . SHOULDER ARTHROSCOPY WITH ROTATOR CUFF REPAIR AND SUBACROMIAL DECOMPRESSION Right 08/05/2017   Procedure: RIGHT SHOULDER ARTHROSCOPY WITH ROTATOR CUFF REPAIR, DISTAL CLAVICLE EXCISION, DEBRIDEMENT AND SUBACROMIAL DECOMPRESSION;  Surgeon: Leandrew Koyanagi, MD;  Location: [place];  Service: Orthopedics;  Laterality: Right;    There were no vitals filed for this visit.  Subjective Assessment - 09/20/17 1422    Subjective  very little pain    Patient is accompained by:  Interpreter Telephonic Interpreting, ID# [id number]    Currently in Pain?  Yes    Pain Score  2     Pain Location  Shoulder    Pain Orientation  Right    Aggravating Factors   lifting up     Pain Relieving Factors  change positions         [facility] PT Assessment - 09/20/17 0001      PROM   Right Shoulder Flexion  140 Degrees    Right Shoulder ABduction  100 Degrees    Right Shoulder External Rotation  40 Degrees                  [facility] Adult PT Treatment/Exercise - 09/20/17 0001      Shoulder Exercises: Supine   Other Supine Exercises  supine cane pullovers , ER       Shoulder Exercises: Standing   Extension  20 reps;Theraband red to neutral    Other Standing Exercises   bicep curls 3# x 20      Shoulder Exercises: Pulleys   Flexion  3 minutes      Shoulder Exercises: ROM/Strengthening   Other ROM/Strengthening Exercises  wall walk, bilateral x 10 cues to keep comfortable       Shoulder Exercises: Isometric Strengthening   Flexion Limitations  10 x 5 sec    Extension Limitations  10 x 5 sec    External Rotation Limitations  10 x 5 sec     Internal Rotation Limitations  10 x 5 sec       Cryotherapy   Number Minutes Cryotherapy  15 Minutes    Cryotherapy Location  Shoulder    Type of Cryotherapy  Ice pack      Manual Therapy   Manual therapy comments  PROM into flexion/ ER/ IR /abduction               PT Short Term Goals - 09/20/17 1534      PT SHORT TERM GOAL #1   Title  Patient will increase passive right ER to 30 degrees     Baseline  40  Time  3    Period  Weeks    Status  Achieved      PT SHORT TERM GOAL #2   Title  Patient will increase rightpassive shoulder flexion to 90 degrees     Baseline  140    Time  3    Period  Weeks    Status  Achieved      PT SHORT TERM GOAL #3   Title  Patient will be independent with initial HEP     Time  3    Period  Weeks    Status  Achieved      PT SHORT TERM GOAL #4   Title  Patient will demostrate 3/5 right shoulder flexion strength below 90 degrees     Time  4    Period  Weeks    Status  Achieved        PT Long Term Goals - 08/22/17 1224      PT LONG TERM GOAL #1   Title  Patient will demonstrate full left shoulder ER in order to reach behind his head     Time  12    Period  Weeks    Status  New    Target Date  11/14/17      PT LONG TERM GOAL #2   Title  Patient will reach behind his back with the right arm to L1 without pain in order to perform ADL's     Time  12    Period  Weeks    Status  New    Target Date  11/14/17      PT LONG TERM GOAL #3   Title  Patient will reach overhad into a cabinet with a 2lb weight in order to perfrom ADL's     Time  12    Period   Weeks    Status  New    Target Date  11/14/17      PT LONG TERM GOAL #4   Title  Patient will be independent with program that will progress him towards return to tennis     Time  12    Period  Weeks    Status  New    Target Date  11/14/17            Plan - 09/20/17 1532    Clinical Impression Statement  PROM improving. Pt is 6 weeks post op. Began active assist wall walks into shoulder flexion with good tolerance. Isometrics seems strong. Likely ready for AROM. Independent with HEP thus far. All STGs met.     PT Next Visit Plan  continue PROM; begin AAROM at week 5;; on script it says medicum RTC tear but protocol onyl has large and small; review isometrics and consider adding light band restance to band exercises.     PT Home Exercise Plan  pendulums; wrist flexion/ ext; towel squeeze; scap squeeze, table slides, clasped hand flexion, ER with cane     Consulted and Agree with Plan of Care  Patient       Patient will benefit from skilled therapeutic intervention in order to improve the following deficits and impairments:  Pain, Impaired UE functional use, Decreased safety awareness, Difficulty walking  Visit Diagnosis: Stiffness of right shoulder, not elsewhere classified  Acute pain of right shoulder  Muscle weakness (generalized)  Localized edema     Problem List Patient Active Problem List   Diagnosis Date Noted  . Rotator cuff tear arthropathy of right shoulder  08/31/2017  . Follow up 08/31/2017  . Personal history of gastric ulcer 02/09/2017    Dorene Ar, PTA 09/20/2017, 3:37 PM  [facility] [address] [place], [place], [address] Phone: [phone]   Fax:  [phone]  Name: Roy Reed MRN: [id number] Date of Birth: 05-28-1956

## 2017-09-22 ENCOUNTER — Encounter: Payer: Self-pay | Admitting: Physical Therapy

## 2017-09-22 ENCOUNTER — Ambulatory Visit: Payer: Self-pay | Admitting: Physical Therapy

## 2017-09-22 DIAGNOSIS — R600 Localized edema: Secondary | ICD-10-CM

## 2017-09-22 DIAGNOSIS — M25511 Pain in right shoulder: Secondary | ICD-10-CM

## 2017-09-22 DIAGNOSIS — M6281 Muscle weakness (generalized): Secondary | ICD-10-CM

## 2017-09-22 DIAGNOSIS — M25611 Stiffness of right shoulder, not elsewhere classified: Secondary | ICD-10-CM

## 2017-09-23 ENCOUNTER — Ambulatory Visit (HOSPITAL_COMMUNITY)
Admission: RE | Admit: 2017-09-23 | Discharge: 2017-09-23 | Disposition: A | Discharge status: Home / Self Care | Payer: Self-pay | Source: Ambulatory Visit | Attending: Urology | Admitting: Urology

## 2017-09-23 DIAGNOSIS — R911 Solitary pulmonary nodule: Secondary | ICD-10-CM | POA: Insufficient documentation

## 2017-09-23 DIAGNOSIS — N329 Bladder disorder, unspecified: Secondary | ICD-10-CM | POA: Insufficient documentation

## 2017-09-23 DIAGNOSIS — C61 Malignant neoplasm of prostate: Secondary | ICD-10-CM | POA: Insufficient documentation

## 2017-09-23 MED ORDER — IOPAMIDOL (ISOVUE-300) INJECTION 61%
100.0000 mL | Freq: Once | INTRAVENOUS | Status: AC | PRN
  Administered 2017-09-23: 100 mL via INTRAVENOUS

## 2017-09-23 NOTE — Therapy (Signed)
[place], [place], [address] Phone: [phone]   Fax:  [phone]  Physical Therapy Treatment  Patient Details  Name: Roy Reed MRN: [id number] Date of Birth: [birth year] Referring Provider: Dr Frankey Shown    Encounter Date: 09/22/2017  PT End of Session - 09/23/17 1202    Visit Number  7    Number of Visits  16    Date for PT Re-Evaluation  11/14/17    Authorization Type  CAFA     PT Start Time  1416    PT Stop Time  1508    PT Time Calculation (min)  52 min    Activity Tolerance  Patient tolerated treatment well    Behavior During Therapy  [facility] for tasks assessed/performed       Past Medical History:  Diagnosis Date  . Elevated PSA   . Rotator cuff disorder, right     Past Surgical History:  Procedure Laterality Date  . BACK SURGERY    . SHOULDER ARTHROSCOPY WITH ROTATOR CUFF REPAIR AND SUBACROMIAL DECOMPRESSION Right 08/05/2017   Procedure: RIGHT SHOULDER ARTHROSCOPY WITH ROTATOR CUFF REPAIR, DISTAL CLAVICLE EXCISION, DEBRIDEMENT AND SUBACROMIAL DECOMPRESSION;  Surgeon: Leandrew Koyanagi, MD;  Location: [place];  Service: Orthopedics;  Laterality: Right;    There were no vitals filed for this visit.  Subjective Assessment - 09/22/17 1437    Subjective  Patient reports he is only having painwhen he reaches for something. He has been working on his exercises.     Patient is accompained by:  Interpreter    Limitations  Lifting;Writing;House hold activities    Patient Stated Goals  to return to tennis     Currently in Pain?  No/denies                      [facility] Adult PT Treatment/Exercise - 09/23/17 0001      Shoulder Exercises: Sidelying   External Rotation Limitations  x10 no weight x10 1lb       Shoulder Exercises: Standing   Internal Rotation  Limitations    Internal Rotation Limitations  2x10 red     Extension  20 reps;Theraband red to neutral    Theraband Level (Shoulder Extension)  Level 2 (Red)    Row  Both;20 reps;Theraband    Theraband Level (Shoulder Row)  Level 2 (Red)      Shoulder Exercises: Pulleys   Flexion  3 minutes      Shoulder Exercises: ROM/Strengthening   Other ROM/Strengthening Exercises  wall walk, bilateral x 10 cues to keep comfortable       Cryotherapy   Number Minutes Cryotherapy  10 Minutes    Cryotherapy Location  Shoulder    Type of Cryotherapy  Ice pack      Manual Therapy   Manual Therapy  Joint mobilization;Passive ROM    Manual therapy comments  PROM into flexion/ ER/ IR     Joint Mobilization  PA glides and inferior gides to improve external rotation              PT Education - 09/22/17 1726    Education provided  Yes    Education Details  reviewed HEP; reviewed tecchnique with new exercises.     Person(s) Educated  Patient    Methods  Explanation;Demonstration;Tactile cues;Verbal cues    Comprehension  Verbalized understanding;Returned demonstration;Verbal cues required;Tactile cues required       PT Short Term Goals -  09/20/17 1534      PT SHORT TERM GOAL #1   Title  Patient will increase passive right ER to 30 degrees     Baseline  40    Time  3    Period  Weeks    Status  Achieved      PT SHORT TERM GOAL #2   Title  Patient will increase rightpassive shoulder flexion to 90 degrees     Baseline  140    Time  3    Period  Weeks    Status  Achieved      PT SHORT TERM GOAL #3   Title  Patient will be independent with initial HEP     Time  3    Period  Weeks    Status  Achieved      PT SHORT TERM GOAL #4   Title  Patient will demostrate 3/5 right shoulder flexion strength below 90 degrees     Time  4    Period  Weeks    Status  Achieved        PT Long Term Goals - 08/22/17 1224      PT LONG TERM GOAL #1   Title  Patient will demonstrate full left shoulder ER in order to reach behind his head     Time  12    Period  Weeks    Status  New    Target Date   11/14/17      PT LONG TERM GOAL #2   Title  Patient will reach behind his back with the right arm to L1 without pain in order to perform ADL's     Time  12    Period  Weeks    Status  New    Target Date  11/14/17      PT LONG TERM GOAL #3   Title  Patient will reach overhad into a cabinet with a 2lb weight in order to perfrom ADL's     Time  12    Period  Weeks    Status  New    Target Date  11/14/17      PT LONG TERM GOAL #4   Title  Patient will be independent with program that will progress him towards return to tennis     Time  12    Period  Weeks    Status  New    Target Date  11/14/17            Plan - 09/22/17 1443    Clinical Impression Statement  Patient is limited in passive ER but everything else appears to be progressing. He is having very little pain. Therapy added active RTC strengthening with sidelying ER and band IR. Therapy also added standing AAROm into internal rotation with wand . He had no significant increase in pain.     Rehab Potential  Good    PT Frequency  2x / week    PT Duration  8 weeks    PT Treatment/Interventions  ADLs/Self Care Home Management;Cryotherapy;Electrical Stimulation;Iontophoresis 4mg /ml Dexamethasone;Gait training;Stair training;Therapeutic activities;Therapeutic exercise;Neuromuscular re-education;Passive range of motion    PT Next Visit Plan  continue PROM; begin AAROM at week 5;; on script it says medicum RTC tear but protocol onyl has large and small; review isometrics and consider adding light band restance to band exercises.     PT Home Exercise Plan  pendulums; wrist flexion/ ext; towel squeeze; scap squeeze, table slides, clasped hand flexion, ER with  cane     Consulted and Agree with Plan of Care  Patient       Patient will benefit from skilled therapeutic intervention in order to improve the following deficits and impairments:  Pain, Impaired UE functional use, Decreased safety awareness, Difficulty walking  Visit  Diagnosis: Stiffness of right shoulder, not elsewhere classified  Acute pain of right shoulder  Muscle weakness (generalized)  Localized edema     Problem List Patient Active Problem List   Diagnosis Date Noted  . Rotator cuff tear arthropathy of right shoulder 08/31/2017  . Follow up 08/31/2017  . Personal history of gastric ulcer 02/09/2017    Carney Living 09/23/2017, 12:06 PM  [facility] [address] [place], [place], [address] Phone: [phone]   Fax:  [phone]  Name: Espn Zeman MRN: [id number] Date of Birth: [birth year]

## 2017-09-27 ENCOUNTER — Ambulatory Visit: Payer: Self-pay | Admitting: Physical Therapy

## 2017-09-27 ENCOUNTER — Encounter: Payer: Self-pay | Admitting: Physical Therapy

## 2017-09-27 DIAGNOSIS — M25611 Stiffness of right shoulder, not elsewhere classified: Secondary | ICD-10-CM

## 2017-09-27 DIAGNOSIS — M25511 Pain in right shoulder: Secondary | ICD-10-CM

## 2017-09-27 DIAGNOSIS — R600 Localized edema: Secondary | ICD-10-CM

## 2017-09-27 DIAGNOSIS — M6281 Muscle weakness (generalized): Secondary | ICD-10-CM

## 2017-09-27 NOTE — Therapy (Signed)
[place], [place], [address] Phone: [phone]   Fax:  [phone]  Physical Therapy Treatment  Patient Details  Name: Roy Reed MRN: [id number] Date of Birth: 1955/10/19 Referring Provider: Dr Frankey Shown    Encounter Date: 09/27/2017  PT End of Session - 09/27/17 1429    Visit Number  8    Number of Visits  16    Date for PT Re-Evaluation  11/14/17    Authorization Type  CAFA     PT Start Time  0215    PT Stop Time  0310    PT Time Calculation (min)  55 min       Past Medical History:  Diagnosis Date  . Elevated PSA   . Rotator cuff disorder, right     Past Surgical History:  Procedure Laterality Date  . BACK SURGERY    . SHOULDER ARTHROSCOPY WITH ROTATOR CUFF REPAIR AND SUBACROMIAL DECOMPRESSION Right 08/05/2017   Procedure: RIGHT SHOULDER ARTHROSCOPY WITH ROTATOR CUFF REPAIR, DISTAL CLAVICLE EXCISION, DEBRIDEMENT AND SUBACROMIAL DECOMPRESSION;  Surgeon: Leandrew Koyanagi, MD;  Location: [place];  Service: Orthopedics;  Laterality: Right;    There were no vitals filed for this visit.  Subjective Assessment - 09/27/17 1428    Subjective  Not much pain.     Patient is accompained by:  Interpreter Telephonic Interpreting ID# [id number]    Currently in Pain?  No/denies         [facility] PT Assessment - 09/27/17 0001      PROM   Right Shoulder Flexion  140 Degrees    Right Shoulder ABduction  110 Degrees    Right Shoulder External Rotation  40 Degrees                  [facility] Adult PT Treatment/Exercise - 09/27/17 0001      Shoulder Exercises: Standing   External Rotation Limitations  2 x 10 red     Internal Rotation Limitations  2x10 red     Extension  20 reps;Theraband red to neutral    Theraband Level (Shoulder Extension)  Level 2 (Red)    Row  Both;20 reps;Theraband    Theraband Level (Shoulder Row)  Level 2 (Red)      Shoulder Exercises: Pulleys   Flexion  3 minutes    ABduction  2 minutes      Shoulder Exercises: ROM/Strengthening   Other ROM/Strengthening Exercises  wall walk, bilateral x 10 cues to keep comfortable       Cryotherapy   Number Minutes Cryotherapy  10 Minutes    Cryotherapy Location  Shoulder    Type of Cryotherapy  Ice pack      Manual Therapy   Manual therapy comments  PROM into flexion/ ER/ IR     Joint Mobilization  PA glides and inferior gides to improve external rotation              PT Education - 09/27/17 1458    Education provided  Yes    Education Details  HEP    Person(s) Educated  Patient    Methods  Explanation;Handout    Comprehension  Verbalized understanding       PT Short Term Goals - 09/20/17 1534      PT SHORT TERM GOAL #1   Title  Patient will increase passive right ER to 30 degrees     Baseline  40    Time  3  Period  Weeks    Status  Achieved      PT SHORT TERM GOAL #2   Title  Patient will increase rightpassive shoulder flexion to 90 degrees     Baseline  140    Time  3    Period  Weeks    Status  Achieved      PT SHORT TERM GOAL #3   Title  Patient will be independent with initial HEP     Time  3    Period  Weeks    Status  Achieved      PT SHORT TERM GOAL #4   Title  Patient will demostrate 3/5 right shoulder flexion strength below 90 degrees     Time  4    Period  Weeks    Status  Achieved        PT Long Term Goals - 08/22/17 1224      PT LONG TERM GOAL #1   Title  Patient will demonstrate full left shoulder ER in order to reach behind his head     Time  12    Period  Weeks    Status  New    Target Date  11/14/17      PT LONG TERM GOAL #2   Title  Patient will reach behind his back with the right arm to L1 without pain in order to perform ADL's     Time  12    Period  Weeks    Status  New    Target Date  11/14/17      PT LONG TERM GOAL #3   Title  Patient will reach overhad into a cabinet with a 2lb weight in order to perfrom ADL's      Time  12    Period  Weeks    Status  New    Target Date  11/14/17      PT LONG TERM GOAL #4   Title  Patient will be independent with program that will progress him towards return to tennis     Time  12    Period  Weeks    Status  New    Target Date  11/14/17            Plan - 09/27/17 1450    Clinical Impression Statement  Focused PROM today with continued AAROM exercises as tolerated. Did well with RTC band strengthening, added ER. Updated HEP.     PT Next Visit Plan  FOTO?  continue PROM to maximize ROM ; begin AAROM at week 5;; on script it says medicum RTC tear but protocol onyl has large and small; review  light band restance    PT Home Exercise Plan  pendulums; wrist flexion/ ext; towel squeeze; scap squeeze, table slides, clasped hand flexion, ER with cane , (red band row, extension, ER, IR )    Consulted and Agree with Plan of Care  Patient       Patient will benefit from skilled therapeutic intervention in order to improve the following deficits and impairments:     Visit Diagnosis: Stiffness of right shoulder, not elsewhere classified  Acute pain of right shoulder  Muscle weakness (generalized)  Localized edema     Problem List Patient Active Problem List   Diagnosis Date Noted  . Rotator cuff tear arthropathy of right shoulder 08/31/2017  . Follow up 08/31/2017  . Personal history of gastric ulcer 02/09/2017    Dorene Ar, PTA 09/27/2017, 3:27  PM  [employer] [place], [place], [address] Phone: [phone]   Fax:  [phone]  Name: Jeryn Cerney MRN: [id number] Date of Birth: 07/21/1956

## 2017-09-27 NOTE — Patient Instructions (Signed)
EXTENSION: Standing - Resistance Band: Stable (Active)   Stand, right arm at side. Against yellow resistance band, draw arm backward, as far as possible, keeping elbow straight. Complete __2_ sets of _10__ repetitions. Perform __2_ sessions per day.   Copyright  VHI. All rights reserved.  Resistive Band Rowing   With resistive band anchored in door, grasp both ends. Keeping elbows bent, pull back, squeezing shoulder blades together. Hold __5__ seconds. Repeat _10-20___ times. Do _2___ sessions per day.  Strengthening: Resisted Internal Rotation   Hold tubing in left hand, elbow at side and forearm out. Rotate forearm in across body. Repeat __10__ times per set. Do _2___ sets per session. Do __2__ sessions per day.  http://orth.exer.us/830   Copyright  VHI. All rights reserved.  Strengthening: Resisted External Rotation   Hold tubing in right hand, elbow at side and forearm across body. Rotate forearm out. Repeat ___10_ times per set. Do __2__ sets per session. Do _2___ sessions per day.

## 2017-09-28 ENCOUNTER — Ambulatory Visit (INDEPENDENT_AMBULATORY_CARE_PROVIDER_SITE_OTHER): Payer: Self-pay | Admitting: Urology

## 2017-09-28 DIAGNOSIS — C61 Malignant neoplasm of prostate: Secondary | ICD-10-CM

## 2017-09-29 ENCOUNTER — Encounter: Payer: Self-pay | Admitting: Physical Therapy

## 2017-09-29 ENCOUNTER — Ambulatory Visit: Payer: Self-pay | Admitting: Physical Therapy

## 2017-09-29 DIAGNOSIS — M6281 Muscle weakness (generalized): Secondary | ICD-10-CM

## 2017-09-29 DIAGNOSIS — M25511 Pain in right shoulder: Secondary | ICD-10-CM

## 2017-09-29 DIAGNOSIS — M25611 Stiffness of right shoulder, not elsewhere classified: Secondary | ICD-10-CM

## 2017-09-29 DIAGNOSIS — R600 Localized edema: Secondary | ICD-10-CM

## 2017-09-30 NOTE — Therapy (Signed)
[place], [place], [address] Phone: [phone]   Fax:  [phone]  Physical Therapy Treatment  Patient Details  Name: Roy Reed MRN: [id number] Date of Birth: Mar 03, 1956 Referring Provider: Dr Frankey Shown    Encounter Date: 09/29/2017  PT End of Session - 09/29/17 1420    Visit Number  9    Number of Visits  16    Date for PT Re-Evaluation  11/14/17    Authorization Type  CAFA     PT Start Time  1415    PT Stop Time  1507    PT Time Calculation (min)  52 min    Activity Tolerance  Patient tolerated treatment well    Behavior During Therapy  [facility] for tasks assessed/performed       Past Medical History:  Diagnosis Date  . Elevated PSA   . Rotator cuff disorder, right     Past Surgical History:  Procedure Laterality Date  . BACK SURGERY    . SHOULDER ARTHROSCOPY WITH ROTATOR CUFF REPAIR AND SUBACROMIAL DECOMPRESSION Right 08/05/2017   Procedure: RIGHT SHOULDER ARTHROSCOPY WITH ROTATOR CUFF REPAIR, DISTAL CLAVICLE EXCISION, DEBRIDEMENT AND SUBACROMIAL DECOMPRESSION;  Surgeon: Leandrew Koyanagi, MD;  Location: [place];  Service: Orthopedics;  Laterality: Right;    There were no vitals filed for this visit.  Subjective Assessment - 09/29/17 1419    Subjective  Patient only has pain when he sleeps on his shoulder. He reports no increase in soreness with his exercises.     Limitations  Lifting;Writing;House hold activities    Patient Stated Goals  to return to tennis     Currently in Pain?  No/denies                      [facility] Adult PT Treatment/Exercise - 09/30/17 0001      Shoulder Exercises: Supine   Other Supine Exercises  supine flexion from press to 90 degrees 10x. Minor anterior shoulder pain       Shoulder Exercises: Sidelying   External Rotation Limitations  x10 no weight x10 1lb       Shoulder Exercises: Standing   External Rotation Limitations  2  x 10 red     Internal Rotation Limitations  2x10 red     Extension  20 reps;Theraband red to neutral    Theraband Level (Shoulder Extension)  Level 3 (Green)    Row  [employer];Theraband    Theraband Level (Shoulder Row)  Level 3 (Green)      Cryotherapy   Number Minutes Cryotherapy  10 Minutes    Cryotherapy Location  Shoulder    Type of Cryotherapy  Ice pack      Manual Therapy   Manual Therapy  Joint mobilization;Passive ROM    Manual therapy comments  PROM into flexion/ ER/ IR     Joint Mobilization  PA glides and inferior gides to improve external rotation              PT Education - 09/29/17 1420    Education provided  Yes    Education Details  reviewed technique with exercises     Person(s) Educated  Patient    Methods  Explanation;Demonstration;Handout    Comprehension  Verbalized understanding;Returned demonstration;Verbal cues required;Tactile cues required       PT Short Term Goals - 09/29/17 1505      PT SHORT TERM GOAL #1   Title  Patient will  increase passive right ER to 30 degrees     Baseline  40    Time  3    Period  Weeks    Status  Achieved      PT SHORT TERM GOAL #2   Title  Patient will increase rightpassive shoulder flexion to 90 degrees     Baseline  140    Time  3    Period  Weeks    Status  Achieved      PT SHORT TERM GOAL #3   Title  Patient will be independent with initial HEP     Baseline  perfroming at home with cuing     Time  3    Period  Weeks    Status  Achieved      PT SHORT TERM GOAL #4   Title  Patient will demostrate 3/5 right shoulder flexion strength below 90 degrees     Time  4    Period  Weeks    Status  Achieved        PT Long Term Goals - 08/22/17 1224      PT LONG TERM GOAL #1   Title  Patient will demonstrate full left shoulder ER in order to reach behind his head     Time  12    Period  Weeks    Status  New    Target Date  11/14/17      PT LONG TERM GOAL #2   Title  Patient will reach behind  his back with the right arm to L1 without pain in order to perform ADL's     Time  12    Period  Weeks    Status  New    Target Date  11/14/17      PT LONG TERM GOAL #3   Title  Patient will reach overhad into a cabinet with a 2lb weight in order to perfrom ADL's     Time  12    Period  Weeks    Status  New    Target Date  11/14/17      PT LONG TERM GOAL #4   Title  Patient will be independent with program that will progress him towards return to tennis     Time  12    Period  Weeks    Status  New    Target Date  11/14/17            Plan - 09/29/17 1441    Clinical Impression Statement  Therapy added supine forward flexion starting from 90 degrees. He reported minor anterior shoulder pain. Therapy will monitor and progress as tolerated. Therapy also advancedscpaular exercises to green. he had no increase in pain.     Clinical Presentation  Stable    Clinical Decision Making  Low    Rehab Potential  Good    PT Frequency  2x / week    PT Duration  8 weeks    PT Treatment/Interventions  ADLs/Self Care Home Management;Cryotherapy;Electrical Stimulation;Iontophoresis 4mg /ml Dexamethasone;Gait training;Stair training;Therapeutic activities;Therapeutic exercise;Neuromuscular re-education;Passive range of motion    PT Next Visit Plan  continue with strengthening; progress supine flexion exercises; consider supine protraction; continue RTC strengthening.     PT Home Exercise Plan  pendulums; wrist flexion/ ext; towel squeeze; scap squeeze, table slides, clasped hand flexion, ER with cane , (red band row, extension, ER, IR )    Consulted and Agree with Plan of Care  Patient  Patient will benefit from skilled therapeutic intervention in order to improve the following deficits and impairments:  Pain, Impaired UE functional use, Decreased safety awareness, Difficulty walking  Visit Diagnosis: Stiffness of right shoulder, not elsewhere classified  Acute pain of right  shoulder  Muscle weakness (generalized)  Localized edema     Problem List Patient Active Problem List   Diagnosis Date Noted  . Rotator cuff tear arthropathy of right shoulder 08/31/2017  . Follow up 08/31/2017  . Personal history of gastric ulcer 02/09/2017    Carney Living  PT DPT  09/30/2017, 1:48 PM  [facility] [address] [place], [place], [address] Phone: [phone]   Fax:  [phone]  Name: Roy Reed MRN: [id number] Date of Birth: [birth year]

## 2017-10-06 ENCOUNTER — Encounter: Payer: Self-pay | Admitting: Physical Therapy

## 2017-10-06 ENCOUNTER — Ambulatory Visit: Payer: Self-pay | Admitting: Physical Therapy

## 2017-10-06 DIAGNOSIS — M6281 Muscle weakness (generalized): Secondary | ICD-10-CM

## 2017-10-06 DIAGNOSIS — M25511 Pain in right shoulder: Secondary | ICD-10-CM

## 2017-10-06 DIAGNOSIS — M25611 Stiffness of right shoulder, not elsewhere classified: Secondary | ICD-10-CM

## 2017-10-06 DIAGNOSIS — R600 Localized edema: Secondary | ICD-10-CM

## 2017-10-06 NOTE — Therapy (Signed)
[place], [place], [address] Phone: [phone]   Fax:  [phone]  Physical Therapy Treatment  Patient Details  Name: Roy Reed MRN: [id number] Date of Birth: 1956-06-22 Referring Provider: Dr Frankey Shown    Encounter Date: 10/06/2017  PT End of Session - 10/06/17 0804    Visit Number  10    Number of Visits  16    Date for PT Re-Evaluation  11/14/17    Authorization Type  CAFA     PT Start Time  0800    PT Stop Time  0160    PT Time Calculation (min)  55 min       Past Medical History:  Diagnosis Date  . Elevated PSA   . Rotator cuff disorder, right     Past Surgical History:  Procedure Laterality Date  . BACK SURGERY    . SHOULDER ARTHROSCOPY WITH ROTATOR CUFF REPAIR AND SUBACROMIAL DECOMPRESSION Right 08/05/2017   Procedure: RIGHT SHOULDER ARTHROSCOPY WITH ROTATOR CUFF REPAIR, DISTAL CLAVICLE EXCISION, DEBRIDEMENT AND SUBACROMIAL DECOMPRESSION;  Surgeon: Leandrew Koyanagi, MD;  Location: [facility];  Service: Orthopedics;  Laterality: Right;    There were no vitals filed for this visit.  Subjective Assessment - 10/06/17 0802    Subjective  Shoulder is getting better.     Patient is accompained by:  Interpreter Careers information officer, Language Resources     Currently in Pain?  Yes    Pain Score  3  up to 3/10 with some movements     Pain Location  Shoulder    Pain Orientation  Right    Pain Descriptors / Indicators  Sharp    Aggravating Factors   certain movements     Pain Relieving Factors  change positions         [facility] PT Assessment - 10/06/17 0001      AROM   AROM Assessment Site  Shoulder    Right/Left Shoulder  Right    Right Shoulder Flexion  105 Degrees AAROM wall ladder 128    Right Shoulder Internal Rotation  -- reach right buttock     Right Shoulder External Rotation  -- reach back of neck                   [facility] Adult PT Treatment/Exercise -  10/06/17 0001      Shoulder Exercises: Supine   Other Supine Exercises  supine flexion from press to 90 degrees 10x. Minor anterior shoulder pain , progressed to straight arm flexion from neutral x 10       Shoulder Exercises: Standing   External Rotation Limitations  2 x 10 red     Internal Rotation Limitations  2x10 red     Extension  20 reps;Theraband    Theraband Level (Shoulder Extension)  Level 3 (Green)    Row  [employer];Theraband    Theraband Level (Shoulder Row)  Level 3 (Green)    Other Standing Exercises  wall ladder x 7 flexion    Other Standing Exercises  UE ranger flexion and horizontals level 15 on wall, from florr IR reach behind, standing, cane IR and extension      Shoulder Exercises: Pulleys   Flexion  3 minutes      Shoulder Exercises: Stretch   Internal Rotation Stretch  30 seconds    Internal Rotation Stretch Limitations  AAROM      Cryotherapy   Number Minutes Cryotherapy  10 Minutes  Cryotherapy Location  Shoulder    Type of Cryotherapy  Ice pack      Manual Therapy   Manual Therapy  Joint mobilization;Passive ROM    Manual therapy comments  PROM into flexion/ ER/ IR     Joint Mobilization  PA glides and inferior gides to improve external rotation                PT Short Term Goals - 09/29/17 1505      PT SHORT TERM GOAL #1   Title  Patient will increase passive right ER to 30 degrees     Baseline  40    Time  3    Period  Weeks    Status  Achieved      PT SHORT TERM GOAL #2   Title  Patient will increase rightpassive shoulder flexion to 90 degrees     Baseline  140    Time  3    Period  Weeks    Status  Achieved      PT SHORT TERM GOAL #3   Title  Patient will be independent with initial HEP     Baseline  perfroming at home with cuing     Time  3    Period  Weeks    Status  Achieved      PT SHORT TERM GOAL #4   Title  Patient will demostrate 3/5 right shoulder flexion strength below 90 degrees     Time  4    Period   Weeks    Status  Achieved        PT Long Term Goals - 08/22/17 1224      PT LONG TERM GOAL #1   Title  Patient will demonstrate full left shoulder ER in order to reach behind his head     Time  12    Period  Weeks    Status  New    Target Date  11/14/17      PT LONG TERM GOAL #2   Title  Patient will reach behind his back with the right arm to L1 without pain in order to perform ADL's     Time  12    Period  Weeks    Status  New    Target Date  11/14/17      PT LONG TERM GOAL #3   Title  Patient will reach overhad into a cabinet with a 2lb weight in order to perfrom ADL's     Time  12    Period  Weeks    Status  New    Target Date  11/14/17      PT LONG TERM GOAL #4   Title  Patient will be independent with program that will progress him towards return to tennis     Time  12    Period  Weeks    Status  New    Target Date  11/14/17            Plan - 10/06/17 0851    Clinical Impression Statement  Pt reports reducing pain and soreness. He can reach to 105 flexion in standing. PROM 140 flexion. He can reach to back of neck and can reach behind to right buttock. Began working on IR ROM and stretching. Some lateral shoulder pain with this. Able to progress supine flexion from flexed elbow to straight elbow with mild pain. Progressing toward LTGS.     PT Next Visit Plan  continue  with strengthening; progress supine flexion exercises; consider supine protraction; continue RTC strengthening.     PT Home Exercise Plan  pendulums; wrist flexion/ ext; towel squeeze; scap squeeze, table slides, clasped hand flexion, ER with cane , (red band row, extension, ER, IR )    Consulted and Agree with Plan of Care  Patient       Patient will benefit from skilled therapeutic intervention in order to improve the following deficits and impairments:  Pain, Impaired UE functional use, Decreased safety awareness, Difficulty walking  Visit Diagnosis: Stiffness of right shoulder, not  elsewhere classified  Acute pain of right shoulder  Muscle weakness (generalized)  Localized edema     Problem List Patient Active Problem List   Diagnosis Date Noted  . Rotator cuff tear arthropathy of right shoulder 08/31/2017  . Follow up 08/31/2017  . Personal history of gastric ulcer 02/09/2017    Dorene Ar, PTA 10/06/2017, 8:54 AM  [facility] [address] [place], [place], [address] Phone: [phone]   Fax:  [phone]  Name: Roy Reed MRN: [id number] Date of Birth: 04/04/1956

## 2017-10-10 ENCOUNTER — Encounter: Payer: Self-pay | Admitting: Medical Oncology

## 2017-10-10 ENCOUNTER — Telehealth: Payer: Self-pay | Admitting: Medical Oncology

## 2017-10-10 NOTE — Progress Notes (Signed)
Spoke with Jenny Reichmann at [facility] to ask for a referral for patient to see Dr. Tammi Klippel. Per Dr. Artist Pais note patient to see Dr. Tammi Klippel 2/14 but he was referred to medical oncology by his primary care office. She will ask Mr. Alyson Ingles to place referral.

## 2017-10-10 NOTE — Telephone Encounter (Signed)
I called patient to inform him that we need to reschedule his appointment at the cancer center. He has been referred to medical oncology but needs to see radiation oncology. I informed him that we will call him with a new date and time. He voiced understanding. I also call his son Roderic Palau and left a message with the above.

## 2017-10-11 ENCOUNTER — Ambulatory Visit: Payer: Self-pay | Admitting: Oncology

## 2017-10-11 ENCOUNTER — Ambulatory Visit: Payer: Self-pay | Attending: Orthopaedic Surgery | Admitting: Physical Therapy

## 2017-10-11 ENCOUNTER — Encounter: Payer: Self-pay | Admitting: Physical Therapy

## 2017-10-11 ENCOUNTER — Other Ambulatory Visit: Payer: Self-pay | Admitting: Family Medicine

## 2017-10-11 DIAGNOSIS — M25511 Pain in right shoulder: Secondary | ICD-10-CM

## 2017-10-11 DIAGNOSIS — M25611 Stiffness of right shoulder, not elsewhere classified: Secondary | ICD-10-CM

## 2017-10-11 DIAGNOSIS — M6281 Muscle weakness (generalized): Secondary | ICD-10-CM

## 2017-10-11 DIAGNOSIS — R600 Localized edema: Secondary | ICD-10-CM

## 2017-10-11 DIAGNOSIS — C61 Malignant neoplasm of prostate: Secondary | ICD-10-CM

## 2017-10-11 MED FILL — ?TIZANIDINE HCL 4MG TABLETS: 4 | 8 days supply | Qty: 30 | Fill #1

## 2017-10-12 ENCOUNTER — Encounter: Payer: Self-pay | Admitting: Radiation Oncology

## 2017-10-12 NOTE — Therapy (Signed)
[place], [place], [address] Phone: [phone]   Fax:  [phone]  Physical Therapy Treatment  Patient Details  Name: Jaekwon Mcclune MRN: [id number] Date of Birth: 05-31-1956 Referring Provider: Dr Frankey Shown    Encounter Date: 10/11/2017  PT End of Session - 10/11/17 1434    Visit Number  11    Number of Visits  16    Date for PT Re-Evaluation  11/14/17    PT Start Time  1062    PT Stop Time  1505    PT Time Calculation (min)  50 min    Activity Tolerance  Patient tolerated treatment well    Behavior During Therapy  [facility] for tasks assessed/performed       Past Medical History:  Diagnosis Date  . Elevated PSA   . Rotator cuff disorder, right     Past Surgical History:  Procedure Laterality Date  . BACK SURGERY    . SHOULDER ARTHROSCOPY WITH ROTATOR CUFF REPAIR AND SUBACROMIAL DECOMPRESSION Right 08/05/2017   Procedure: RIGHT SHOULDER ARTHROSCOPY WITH ROTATOR CUFF REPAIR, DISTAL CLAVICLE EXCISION, DEBRIDEMENT AND SUBACROMIAL DECOMPRESSION;  Surgeon: Leandrew Koyanagi, MD;  Location: [facility];  Service: Orthopedics;  Laterality: Right;    There were no vitals filed for this visit.  Subjective Assessment - 10/11/17 1421    Subjective  Patient reports his shoulder is getting better. He is not having pain today. He is having some soreness with Er exercises and IR stretch but it is not too bad.     Limitations  Lifting;Writing;House hold activities    Patient Stated Goals  to return to tennis     Currently in Pain?  No/denies                      [facility] Adult PT Treatment/Exercise - 10/12/17 0001      Shoulder Exercises: Supine   Other Supine Exercises  Supine flexion ful range 2x10       Shoulder Exercises: Sidelying   External Rotation Limitations  2x10 1 lb       Shoulder Exercises: Standing   External Rotation Limitations  2 x 10 red     Internal Rotation  Limitations  2x10 red     Extension  20 reps;Theraband    Theraband Level (Shoulder Extension)  Level 3 (Green)    Row  [employer];Theraband    Theraband Level (Shoulder Row)  Level 3 (Green)    Other Standing Exercises  wall ladder x 7 flexion    Other Standing Exercises  UE ranger flexion and horizontals level 15 on wall, from florr IR reach behind, standing, cane IR and extension      Shoulder Exercises: Pulleys   Flexion  3 minutes      Shoulder Exercises: Stretch   Internal Rotation Stretch  30 seconds    Internal Rotation Stretch Limitations  AAROM      Cryotherapy   Cryotherapy Location  Shoulder      Manual Therapy   Manual Therapy  Joint mobilization;Passive ROM    Manual therapy comments  PROM into flexion/ ER/ IR     Joint Mobilization  PA glides and inferior gides to improve external rotation              PT Education - 10/11/17 1423    Education provided  Yes    Education Details  Reviewed tehcnique with exercises     Person(s)  Educated  Patient    Methods  Explanation;Demonstration;Tactile cues;Verbal cues    Comprehension  Verbalized understanding;Returned demonstration;Verbal cues required;Tactile cues required       PT Short Term Goals - 09/29/17 1505      PT SHORT TERM GOAL #1   Title  Patient will increase passive right ER to 30 degrees     Baseline  40    Time  3    Period  Weeks    Status  Achieved      PT SHORT TERM GOAL #2   Title  Patient will increase rightpassive shoulder flexion to 90 degrees     Baseline  140    Time  3    Period  Weeks    Status  Achieved      PT SHORT TERM GOAL #3   Title  Patient will be independent with initial HEP     Baseline  perfroming at home with cuing     Time  3    Period  Weeks    Status  Achieved      PT SHORT TERM GOAL #4   Title  Patient will demostrate 3/5 right shoulder flexion strength below 90 degrees     Time  4    Period  Weeks    Status  Achieved        PT Long Term Goals -  08/22/17 1224      PT LONG TERM GOAL #1   Title  Patient will demonstrate full left shoulder ER in order to reach behind his head     Time  12    Period  Weeks    Status  New    Target Date  11/14/17      PT LONG TERM GOAL #2   Title  Patient will reach behind his back with the right arm to L1 without pain in order to perform ADL's     Time  12    Period  Weeks    Status  New    Target Date  11/14/17      PT LONG TERM GOAL #3   Title  Patient will reach overhad into a cabinet with a 2lb weight in order to perfrom ADL's     Time  12    Period  Weeks    Status  New    Target Date  11/14/17      PT LONG TERM GOAL #4   Title  Patient will be independent with program that will progress him towards return to tennis     Time  12    Period  Weeks    Status  New    Target Date  11/14/17            Plan - 10/11/17 1440    Clinical Impression Statement  Patient continues to make progress. He had no pain with supine forward flexion tday. He continues to have fatigue with exercises but it is improving. He is still lacking some passive ER but per visual inspection it is improving.     Clinical Presentation  Stable    Clinical Decision Making  Low    Rehab Potential  Good    PT Frequency  2x / week    PT Duration  8 weeks    PT Treatment/Interventions  ADLs/Self Care Home Management;Cryotherapy;Electrical Stimulation;Iontophoresis 4mg /ml Dexamethasone;Gait training;Stair training;Therapeutic activities;Therapeutic exercise;Neuromuscular re-education;Passive range of motion    PT Next Visit Plan  continue with strengthening; progress supine  flexion exercises; consider supine protraction; continue RTC strengthening.     PT Home Exercise Plan  pendulums; wrist flexion/ ext; towel squeeze; scap squeeze, table slides, clasped hand flexion, ER with cane , (red band row, extension, ER, IR )    Consulted and Agree with Plan of Care  Patient       Patient will benefit from skilled  therapeutic intervention in order to improve the following deficits and impairments:  Pain, Impaired UE functional use, Decreased safety awareness, Difficulty walking  Visit Diagnosis: Stiffness of right shoulder, not elsewhere classified  Acute pain of right shoulder  Muscle weakness (generalized)  Localized edema     Problem List Patient Active Problem List   Diagnosis Date Noted  . Rotator cuff tear arthropathy of right shoulder 08/31/2017  . Follow up 08/31/2017  . Personal history of gastric ulcer 02/09/2017    Carney Living PT DPT  10/12/2017, 9:45 AM  [facility] [address] [place], [place], [address] Phone: [phone]   Fax:  [phone]  Name: Larnell Granlund MRN: [id number] Date of Birth: [birth year]

## 2017-10-13 ENCOUNTER — Ambulatory Visit: Payer: Self-pay | Admitting: Physical Therapy

## 2017-10-13 DIAGNOSIS — M6281 Muscle weakness (generalized): Secondary | ICD-10-CM

## 2017-10-13 DIAGNOSIS — R600 Localized edema: Secondary | ICD-10-CM

## 2017-10-13 DIAGNOSIS — C61 Malignant neoplasm of prostate: Secondary | ICD-10-CM | POA: Insufficient documentation

## 2017-10-13 DIAGNOSIS — M25611 Stiffness of right shoulder, not elsewhere classified: Secondary | ICD-10-CM

## 2017-10-13 DIAGNOSIS — M25511 Pain in right shoulder: Secondary | ICD-10-CM

## 2017-10-13 NOTE — Progress Notes (Addendum)
GU Location of Tumor / Histology: prostatic adenocarcinoma  If Prostate Cancer, Gleason Score is (4 + 3) and PSA is (50.4). Prostate volume: 29.3 grams.  Patient reports he was told in 2015 his PSA was elevated. Reports the biopsy below is his second prostate biopsy.  Biopsies of prostate (if applicable) revealed:    Past/Anticipated interventions by urology, if any: prostate biopsy, CT and bone scan (negative), Firmagon 240 on 09/28/2017, planning for lupron 45 the end of February  Past/Anticipated interventions by medical oncology, if any: no  Weight changes, if any: no  Bowel/Bladder complaints, if any: IPSS 4. Denies dysuria, hematuria, leakage or incontinence.   Nausea/Vomiting, if any: no  Pain issues, if any:  Intermittent low back pain worse when he used to play tennis  SAFETY ISSUES:  Prior radiation? no  Pacemaker/ICD? no  Possible current pregnancy? no  Is the patient on methotrexate? no  Current Complaints / other details:  62 year old male. Married. Speaks [place].

## 2017-10-14 ENCOUNTER — Encounter: Payer: Self-pay | Admitting: Radiation Oncology

## 2017-10-14 ENCOUNTER — Ambulatory Visit
Admission: RE | Admit: 2017-10-14 | Discharge: 2017-10-14 | Disposition: A | Discharge status: Home / Self Care | Payer: Self-pay | Source: Ambulatory Visit | Attending: Radiation Oncology | Admitting: Radiation Oncology

## 2017-10-14 ENCOUNTER — Other Ambulatory Visit: Payer: Self-pay

## 2017-10-14 DIAGNOSIS — C61 Malignant neoplasm of prostate: Secondary | ICD-10-CM

## 2017-10-14 DIAGNOSIS — Z886 Allergy status to analgesic agent status: Secondary | ICD-10-CM | POA: Insufficient documentation

## 2017-10-14 HISTORY — DX: Malignant neoplasm of prostate: C61

## 2017-10-14 NOTE — Progress Notes (Signed)
Radiation Oncology         [phone] ________________________________  Initial outpatient Consultation  Name: Roy Reed MRN: [id number]  Date: 10/14/2017  DOB: [birth year]  NA:[id number], Maylon Peppers, FNP  Alfonse Spruce, F*   REFERRING PHYSICIAN: Alfonse Spruce, F*  DIAGNOSIS: 62 y.o. gentleman with Stage T1c adenocarcinoma of the prostate with Gleason Score of 4+3, and PSA of 50.4.    ICD-10-CM   1. Malignant neoplasm of prostate ([place]) C61     HISTORY OF PRESENT ILLNESS: Roy Reed is a 62 y.o. male with a diagnosis of prostate cancer. He was noted to have an elevated PSA of 50.4 by his primary care physician, Dr. Kristie Cowman.  Apparently, he had an elevated PSA of 34 in 2016 and was evaluated with Dr. Alyson Ingles but had to go back home to tend to family issues in [place] and has not had prior TRUSPBx as was recommended at that time.  Accordingly, he was referred back to Dr. Alyson Ingles on 05/11/17,  digital rectal examination was performed at that time revealing symmetric lobes without discrete nodularity.  The patient proceeded to transrectal ultrasound with 12 biopsies of the prostate on 06/15/17.  The prostate volume measured 29.3 cc.  Out of 12 core biopsies,9 were positive.  The maximum Gleason score was 4+3, and this was seen in all 6 cores on the left.  Additionally, Gleason 3+3 was seen in 3 of the 6 cores on the right.  Patient reports that he was out of the country [place] for the past 3 years, and when he came back in the fall, his PSA was rechecked and was further elevated. He reports that he will be staying here in the [name] for treatment. The patient had a bone scan on September 09, 2017 which was negative for bony metastatic disease. He also had a CT A/P on September 23, 2017 and this was also negative for metastatic disease but did note a 4 mm right lower lobe nodule which was felt to be likely benign.   He started [place] on 09/28/17 and is scheduled for Lupron  injection in 11/2017.  The patient reviewed the biopsy results with his urologist and he has kindly been referred today for discussion of potential radiation treatment options.  He is accompanied today by his wife and a medical interpreter, as he and his wife speak [place] and very little [place].  He has a daughter that lives here and speaks fluent [place] and [place]. She will be assisting with scheduling and coordinating care.   PREVIOUS RADIATION THERAPY: No  PAST MEDICAL HISTORY:  Past Medical History:  Diagnosis Date  . Elevated PSA   . Prostate cancer ([place])   . Rotator cuff disorder, right       PAST SURGICAL HISTORY: Past Surgical History:  Procedure Laterality Date  . PROSTATE BIOPSY    . PROSTATE BIOPSY    . SHOULDER ARTHROSCOPY WITH ROTATOR CUFF REPAIR AND SUBACROMIAL DECOMPRESSION Right 08/05/2017   Procedure: RIGHT SHOULDER ARTHROSCOPY WITH ROTATOR CUFF REPAIR, DISTAL CLAVICLE EXCISION, DEBRIDEMENT AND SUBACROMIAL DECOMPRESSION;  Surgeon: Leandrew Koyanagi, MD;  Location: [facility];  Service: Orthopedics;  Laterality: Right;    FAMILY HISTORY: No family history on file.  SOCIAL HISTORY:  Social History   Socioeconomic History  . Marital status: Married    Spouse name: Not on file  . Number of children: Not on file  . Years of education: Not on file  . Highest education level: Not on  file  Social Needs  . Financial resource strain: Not on file  . Food insecurity - worry: Not on file  . Food insecurity - inability: Not on file  . Transportation needs - medical: Not on file  . Transportation needs - non-medical: Not on file  Occupational History  . Not on file  Tobacco Use  . Smoking status: Never Smoker  . Smokeless tobacco: Never Used  Substance and Sexual Activity  . Alcohol use: Yes    Comment: social  . Drug use: No  . Sexual activity: Not on file  Other Topics Concern  . Not on file  Social History Narrative  . Not on file     ALLERGIES: Aspirin  MEDICATIONS:  Current Outpatient Medications  Medication Sig Dispense Refill  . acetaminophen (TYLENOL) 500 MG tablet Take 2 tablets (1,000 mg total) by mouth every 6 (six) hours as needed for moderate pain. (Patient not taking: Reported on 08/31/2017) 30 tablet 0  . naproxen (NAPROSYN) 500 MG tablet Take 1 tablet (500 mg total) by mouth 2 (two) times daily with a meal. X 10 days then prn pain (Patient not taking: Reported on 08/31/2017) 60 tablet 0  . ondansetron (ZOFRAN) 4 MG tablet Take 1-2 tablets (4-8 mg total) by mouth every 8 (eight) hours as needed for nausea or vomiting. (Patient not taking: Reported on 08/31/2017) 40 tablet 0  . oxyCODONE (OXYCONTIN) 10 mg 12 hr tablet Take 1 tablet (10 mg total) by mouth every 12 (twelve) hours. (Patient not taking: Reported on 08/31/2017) 10 tablet 0  . oxyCODONE-acetaminophen (PERCOCET) 5-325 MG tablet Take 1-2 tablets by mouth every 4 (four) hours as needed for severe pain. (Patient not taking: Reported on 08/31/2017) 30 tablet 0  . promethazine (PHENERGAN) 25 MG tablet Take 1 tablet (25 mg total) by mouth every 6 (six) hours as needed for nausea. (Patient not taking: Reported on 08/31/2017) 30 tablet 1  . senna-docusate (SENOKOT S) 8.6-50 MG tablet Take 1 tablet by mouth at bedtime as needed. (Patient not taking: Reported on 08/31/2017) 30 tablet 1  . tiZANidine (ZANAFLEX) 4 MG tablet Take 1 tablet (4 mg total) by mouth every 6 (six) hours as needed for muscle spasms. (Patient not taking: Reported on 08/31/2017) 30 tablet 2  . tiZANidine (ZANAFLEX) 4 MG tablet Take 1 tablet (4 mg total) by mouth every 6 (six) hours as needed for muscle spasms. 30 tablet 2  . traMADol (ULTRAM) 50 MG tablet Take 1-2 tablets (50-100 mg total) 3 (three) times daily as needed by mouth. (Patient not taking: Reported on 08/31/2017) 30 tablet 2   No current facility-administered medications for this encounter.     REVIEW OF SYSTEMS:  On review of  systems, the patient reports that he is doing well overall. He denies any chest pain, shortness of breath, cough, fevers, chills, night sweats, unintended weight changes. He denies any bowel disturbances, and denies abdominal pain, nausea or vomiting. He denies any new musculoskeletal or joint aches or pains. His IPSS was 4, indicating mild urinary symptoms. He is able to complete sexual activity. A complete review of systems is obtained and is otherwise negative.    PHYSICAL EXAM:  Wt Readings from Last 3 Encounters:  10/14/17 243 lb (110.2 kg)  08/31/17 236 lb (107 kg)  08/05/17 232 lb 4 oz (105.3 kg)   Temp Readings from Last 3 Encounters:  08/31/17 98.3 F (36.8 C) (Oral)  08/05/17 97.7 F (36.5 C) (Oral)  06/15/17 98.4 F (36.9 C) (  Oral)   BP Readings from Last 3 Encounters:  08/31/17 127/77  08/05/17 (!) 131/92  06/15/17 130/90   Pulse Readings from Last 3 Encounters:  08/31/17 96  08/05/17 88  06/15/17 84    /10  In general this is a well appearing gentleman in no acute distress. He is alert and oriented x4 and appropriate throughout the examination. HEENT reveals that the patient is normocephalic, atraumatic. EOMs are intact. PERRLA. Skin is intact without any evidence of gross lesions. Cardiovascular exam reveals a regular rate and rhythm, no clicks rubs or murmurs are auscultated. Chest is clear to auscultation bilaterally. Lymphatic assessment is performed and does not reveal any adenopathy in the cervical, supraclavicular, axillary, or inguinal chains. Abdomen has active bowel sounds in all quadrants and is intact. The abdomen is soft, non tender, non distended. Lower extremities are negative for pretibial pitting edema, deep calf tenderness, cyanosis or clubbing.   KPS = 100  100 - Normal; no complaints; no evidence of disease. 90   - Able to carry on normal activity; minor signs or symptoms of disease. 80   - Normal activity with effort; some signs or symptoms of  disease. 23   - Cares for self; unable to carry on normal activity or to do active work. 60   - Requires occasional assistance, but is able to care for most of his personal needs. 50   - Requires considerable assistance and frequent medical care. 17   - Disabled; requires special care and assistance. 30   - Severely disabled; hospital admission is indicated although death not imminent. 100   - Very sick; hospital admission necessary; active supportive treatment necessary. 10   - Moribund; fatal processes progressing rapidly. 0     - Dead  Karnofsky DA, Abelmann [place], Craver LS and Burchenal [facility] [phone]) The use of the nitrogen mustards in the palliative treatment of carcinoma: with particular reference to bronchogenic carcinoma Cancer 1 634-56  LABORATORY DATA:  Lab Results  Component Value Date   WBC 6.0 02/28/2017   HGB 13.9 02/28/2017   HCT 41.4 02/28/2017   MCV 82 02/28/2017   PLT 263 02/28/2017   Lab Results  Component Value Date   NA 140 09/07/2017   K 4.2 09/07/2017   CL 100 (L) 09/07/2017   CO2 28 09/07/2017   No results found for: ALT, AST, GGT, ALKPHOS, BILITOT   RADIOGRAPHY: Ct Abdomen Pelvis W Wo Contrast  Result Date: 09/23/2017 CLINICAL DATA:  New diagnosis of prostate cancer. EXAM: CT ABDOMEN AND PELVIS WITHOUT AND WITH CONTRAST TECHNIQUE: Multidetector CT imaging of the abdomen and pelvis was performed following the standard protocol before and following the bolus administration of intravenous contrast. CONTRAST:  120mL ISOVUE-300 IOPAMIDOL (ISOVUE-300) INJECTION 61% COMPARISON:  None. FINDINGS: Lower chest: Sub 4 mm pulmonary nodule in the right lower lobe on image number 2 is likely benign. No other pulmonary nodules or acute pulmonary findings. No pleural effusion. The heart is normal in size. The distal esophagus is grossly normal. Hepatobiliary: No focal hepatic lesions or intrahepatic biliary dilatation. The gallbladder is normal. No common bile duct dilatation.  Pancreas: No mass, inflammation or ductal dilatation. Spleen: Normal size.  No focal lesions. Adrenals/Urinary Tract: The adrenal glands and kidneys are unremarkable. The bladder demonstrates uniform wall thickening possibly due to partial bladder outlet obstruction and lack of distension. Stomach/Bowel: The stomach, duodenum, small bowel and colon are grossly normal without oral contrast. No inflammatory changes, mass lesions or obstructive findings. The  terminal ileum and appendix are normal. Vascular/Lymphatic: The aorta is normal in caliber. No dissection. The branch vessels are patent. The major venous structures are patent. No mesenteric or retroperitoneal mass or adenopathy. Small scattered lymph nodes are noted. Reproductive: The prostate gland and seminal vesicles are unremarkable. Other: No pelvic mass or adenopathy. Small scattered lymph nodes. No free pelvic fluid collections. No inguinal mass or adenopathy. No abdominal wall hernia or subcutaneous lesions. Musculoskeletal: No sclerotic bone lesions to suggest metastatic disease. Lower lumbar facet disease and bilateral SI joint degenerative changes are noted. IMPRESSION: 1. No CT findings to suggest metastatic prostate cancer in the abdomen/pelvis. No adenopathy. 2. No worrisome bone lesions. 3. Sub 4 mm right lower lobe pulmonary nodule, likely benign. Attention on future studies is suggested. 4. Mild uniform bladder wall thickening likely due to partial bladder outlet obstruction and lack of distension. Electronically Signed   By: Marijo Sanes M.D.   On: 09/23/2017 16:32      IMPRESSION/PLAN: 1. 62 y.o. gentleman with Stage T1c adenocarcinoma of the prostate with Gleason Score of 4+3, and PSA of 50.4. We discussed the patient's workup and outlined the nature of prostate cancer in this setting. The patient's T stage, Gleason's score, and PSA put him into the high risk group. Accordingly, he is eligible for a variety of potential treatment options  including prostatectomy or LT-ADT in combination with either 8 weeks of external radiation or an brachytherapy boost up front with SpaceOAR followed by 5 weeks of external radiation . We discussed the available radiation techniques, and focused on the details and logistics and delivery. The recommendation is for brachytherapy boost with SpaceOAR and 5 weeks of EBRT in combination with ADT.  We discussed and outlined the risks, benefits, short and long-term effects associated with radiotherapy and compared and contrasted these with prostatectomy. We discussed the role of SpaceOAR in reducing the rectal toxicity associated with radiotherapy. We also detailed the role of ADT in the treatment of high risk prostate cancer and outlined the associated side effects that could be expected with this therapy.  He has started ADT with [facility] on 09/28/17.  At the conclusion of our conversation, he elects to proceed with the brachytherapy boost upfront with SpaceOAR insertion followed by 5 weeks of external beam radiotherapy in combination with long-term ADT.  We will move forward with scheduling brachytherapy to occur in late March as this would allow for 2 months of radiosensitization with ADT which was started 09/28/17.  We will see him back in the office approximately 2 weeks following his procedure and will move forward with CT simulation in preparation to begin 5 weeks of external beam radiotherapy shortly thereafter.  He appears to have a good understanding of his disease and our recommendations and is comfortable with this plan.  All treatment coordination will occur through his daughter Judson Roch, and she can be reached at [phone].  We spent 60 minutes minutes face to face with the patient and more than 50% of that time was spent in counseling and/or coordination of care.   Nicholos Johns, PA-C    Tyler Pita, MD  [facility] Direct Dial: [phone]  Fax:  [phone] [email or web address]  Skype  LinkedIn  This document serves as a record of services personally performed by Tyler Pita, MD and Ashlyn Bruning PA-C. It was created on their behalf by Delton Coombes, a trained medical scribe. The creation of this record is based on the scribe's personal observations and the  provider's statements to them.

## 2017-10-14 NOTE — Progress Notes (Signed)
See progress note under physician encounter. 

## 2017-10-14 NOTE — Therapy (Signed)
[place], [place], [address] Phone: [phone]   Fax:  [phone]  Physical Therapy Treatment  Patient Details  Name: Roy Reed MRN: [id number] Date of Birth: May 02, 1956 Referring Provider: Dr Frankey Shown    Encounter Date: 10/13/2017  PT End of Session - 10/13/17 1639    Visit Number  12    Number of Visits  16    Date for PT Re-Evaluation  11/14/17    Authorization Type  CAFA     PT Start Time  1630    PT Stop Time  1726    PT Time Calculation (min)  56 min    Activity Tolerance  Patient tolerated treatment well       Past Medical History:  Diagnosis Date  . Elevated PSA   . Rotator cuff disorder, right     Past Surgical History:  Procedure Laterality Date  . BACK SURGERY    . SHOULDER ARTHROSCOPY WITH ROTATOR CUFF REPAIR AND SUBACROMIAL DECOMPRESSION Right 08/05/2017   Procedure: RIGHT SHOULDER ARTHROSCOPY WITH ROTATOR CUFF REPAIR, DISTAL CLAVICLE EXCISION, DEBRIDEMENT AND SUBACROMIAL DECOMPRESSION;  Surgeon: Leandrew Koyanagi, MD;  Location: [facility];  Service: Orthopedics;  Laterality: Right;    There were no vitals filed for this visit.  Subjective Assessment - 10/13/17 1640    Subjective  Patient continues to report improvement. He had minor throbbing and pain in the anterior shoulder and posterior shoulder after the last visit. It has since improved. he is having no pain today.     Limitations  Lifting;Writing;House hold activities    Patient Stated Goals  to return to tennis     Currently in Pain?  No/denies                      [facility] Adult PT Treatment/Exercise - 10/14/17 0001      Shoulder Exercises: Supine   Other Supine Exercises  Supine flexion ful range 2x10       Shoulder Exercises: Sidelying   External Rotation Limitations  2x10 1 lb       Shoulder Exercises: Standing   External Rotation Limitations  2 x 10 red     Internal Rotation  Limitations  2x10 red     Extension  20 reps;Theraband    Theraband Level (Shoulder Extension)  Level 4 (Blue)    Row  Both;20 reps;Theraband    Theraband Level (Shoulder Row)  Level 4 (Blue)    Other Standing Exercises  sahelf reach 2nd shelf x10 3rd shelf x10     Other Standing Exercises  UE ranger flexion and horizontals level 15 on wall, from florr IR reach behind, standing, cane IR and extension      Shoulder Exercises: Pulleys   Flexion  3 minutes      Shoulder Exercises: Stretch   Internal Rotation Stretch  30 seconds    Internal Rotation Stretch Limitations  AAROM      Cryotherapy   Cryotherapy Location  Shoulder      Manual Therapy   Manual Therapy  Joint mobilization;Passive ROM    Manual therapy comments  PROM into flexion/ ER/ IR     Joint Mobilization  PA glides and inferior gides to improve external rotation; Mulligan IR strap mobilization             PT Education - 10/13/17 1639    Education provided  Yes    Education Details  reviewed symptom mangement  Person(s) Educated  Patient    Methods  Explanation;Demonstration;Tactile cues;Verbal cues    Comprehension  Verbalized understanding;Returned demonstration;Verbal cues required;Tactile cues required;Need further instruction       PT Short Term Goals - 09/29/17 1505      PT SHORT TERM GOAL #1   Title  Patient will increase passive right ER to 30 degrees     Baseline  40    Time  3    Period  Weeks    Status  Achieved      PT SHORT TERM GOAL #2   Title  Patient will increase rightpassive shoulder flexion to 90 degrees     Baseline  140    Time  3    Period  Weeks    Status  Achieved      PT SHORT TERM GOAL #3   Title  Patient will be independent with initial HEP     Baseline  perfroming at home with cuing     Time  3    Period  Weeks    Status  Achieved      PT SHORT TERM GOAL #4   Title  Patient will demostrate 3/5 right shoulder flexion strength below 90 degrees     Time  4     Period  Weeks    Status  Achieved        PT Long Term Goals - 08/22/17 1224      PT LONG TERM GOAL #1   Title  Patient will demonstrate full left shoulder ER in order to reach behind his head     Time  12    Period  Weeks    Status  New    Target Date  11/14/17      PT LONG TERM GOAL #2   Title  Patient will reach behind his back with the right arm to L1 without pain in order to perform ADL's     Time  12    Period  Weeks    Status  New    Target Date  11/14/17      PT LONG TERM GOAL #3   Title  Patient will reach overhad into a cabinet with a 2lb weight in order to perfrom ADL's     Time  12    Period  Weeks    Status  New    Target Date  11/14/17      PT LONG TERM GOAL #4   Title  Patient will be independent with program that will progress him towards return to tennis     Time  12    Period  Weeks    Status  New    Target Date  11/14/17            Plan - 10/13/17 1653    Clinical Impression Statement  Therapy added standing cabinetreach. He had no increase in pain Overall his strength is improving. He was encouraged to continue with wand ER and wand IR.     Clinical Presentation  Stable    Clinical Decision Making  Low    Rehab Potential  Good    PT Frequency  2x / week    PT Duration  8 weeks    PT Treatment/Interventions  ADLs/Self Care Home Management;Cryotherapy;Electrical Stimulation;Iontophoresis 4mg /ml Dexamethasone;Gait training;Stair training;Therapeutic activities;Therapeutic exercise;Neuromuscular re-education;Passive range of motion    PT Next Visit Plan  continue with strengthening; progress supine flexion exercises; consider supine protraction; continue RTC strengthening.  PT Home Exercise Plan  pendulums; wrist flexion/ ext; towel squeeze; scap squeeze, table slides, clasped hand flexion, ER with cane , (red band row, extension, ER, IR )    Consulted and Agree with Plan of Care  Patient       Patient will benefit from skilled therapeutic  intervention in order to improve the following deficits and impairments:  Pain, Impaired UE functional use, Decreased safety awareness, Difficulty walking  Visit Diagnosis: Stiffness of right shoulder, not elsewhere classified  Acute pain of right shoulder  Muscle weakness (generalized)  Localized edema     Problem List Patient Active Problem List   Diagnosis Date Noted  . Malignant neoplasm of prostate ([place]) 10/13/2017  . Rotator cuff tear arthropathy of right shoulder 08/31/2017  . Follow up 08/31/2017  . Personal history of gastric ulcer 02/09/2017    Carney Living PT DPT  10/14/2017, 8:05 AM  [facility] [address] [place], [place], [address] Phone: [phone]   Fax:  [phone]  Name: Roy Reed MRN: [id number] Date of Birth: 12-31-1955

## 2017-10-17 ENCOUNTER — Encounter: Payer: Self-pay | Admitting: Medical Oncology

## 2017-10-18 ENCOUNTER — Encounter: Payer: Self-pay | Admitting: Physical Therapy

## 2017-10-18 ENCOUNTER — Ambulatory Visit: Payer: Self-pay | Admitting: Physical Therapy

## 2017-10-18 DIAGNOSIS — M25611 Stiffness of right shoulder, not elsewhere classified: Secondary | ICD-10-CM

## 2017-10-18 DIAGNOSIS — R600 Localized edema: Secondary | ICD-10-CM

## 2017-10-18 DIAGNOSIS — M6281 Muscle weakness (generalized): Secondary | ICD-10-CM

## 2017-10-18 DIAGNOSIS — M25511 Pain in right shoulder: Secondary | ICD-10-CM

## 2017-10-18 NOTE — Therapy (Signed)
[place], [place], [address] Phone: [phone]   Fax:  [phone]  Physical Therapy Treatment  Patient Details  Name: Roy Reed MRN: [id number] Date of Birth: 01-Aug-1956 Referring Provider: Dr Frankey Shown    Encounter Date: 10/18/2017  PT End of Session - 10/18/17 1421    Visit Number  13    Number of Visits  16    Date for PT Re-Evaluation  11/14/17    Authorization Type  CAFA     PT Start Time  1415    PT Stop Time  1505    PT Time Calculation (min)  50 min    Activity Tolerance  Patient tolerated treatment well    Behavior During Therapy  [facility] for tasks assessed/performed       Past Medical History:  Diagnosis Date  . Elevated PSA   . Prostate cancer ([place])   . Rotator cuff disorder, right     Past Surgical History:  Procedure Laterality Date  . PROSTATE BIOPSY    . PROSTATE BIOPSY    . SHOULDER ARTHROSCOPY WITH ROTATOR CUFF REPAIR AND SUBACROMIAL DECOMPRESSION Right 08/05/2017   Procedure: RIGHT SHOULDER ARTHROSCOPY WITH ROTATOR CUFF REPAIR, DISTAL CLAVICLE EXCISION, DEBRIDEMENT AND SUBACROMIAL DECOMPRESSION;  Surgeon: Leandrew Koyanagi, MD;  Location: [place];  Service: Orthopedics;  Laterality: Right;    There were no vitals filed for this visit.  Subjective Assessment - 10/18/17 1419    Subjective  Patient reported no soreness after the last visit. he continues to be limited with internal rotation     Patient is accompained by:  Interpreter    Limitations  Lifting;Writing;House hold activities    Patient Stated Goals  to return to tennis     Currently in Pain?  No/denies                      [facility] Adult PT Treatment/Exercise - 10/18/17 0001      Shoulder Exercises: Supine   Other Supine Exercises  Supine flexion full range 2x10  1lb D2 flexion 1lb x10       Shoulder Exercises: Sidelying   External Rotation Limitations  2x10 1 lb       Shoulder Exercises: Standing   External Rotation Limitations  2 x 10 green     Internal Rotation Limitations  2x10 green     Extension  20 reps;Theraband    Theraband Level (Shoulder Extension)  Level 4 (Blue)    Row  Both;20 reps;Theraband    Theraband Level (Shoulder Row)  Level 4 (Blue)    Other Standing Exercises  shelf reach 2nd shelf x10 1lb     Other Standing Exercises  UE ranger flexion and horizontals level 15 on wall, from florr IR reach behind, standing, cane IR and extension      Shoulder Exercises: Pulleys   Flexion  --      Shoulder Exercises: ROM/Strengthening   UBE (Upper Arm Bike)  2 min forward 2 min back       Shoulder Exercises: Stretch   Internal Rotation Stretch  10 seconds    Internal Rotation Stretch Limitations  Sleeper stretch       Cryotherapy   Cryotherapy Location  Shoulder      Manual Therapy   Manual Therapy  Joint mobilization;Passive ROM    Manual therapy comments  PROM into flexion/ ER/ IR     Joint Mobilization  PA glides and inferior  gides to improve external rotation; Mulligan IR strap mobilization             PT Education - 10/18/17 1420    Education provided  Yes    Education Details  reviewed UBE; reivewed sleeper stretch into IR     Person(s) Educated  Patient    Methods  Explanation;Demonstration;Tactile cues;Verbal cues    Comprehension  Verbalized understanding;Returned demonstration;Tactile cues required;Verbal cues required;Need further instruction       PT Short Term Goals - 09/29/17 1505      PT SHORT TERM GOAL #1   Title  Patient will increase passive right ER to 30 degrees     Baseline  40    Time  3    Period  Weeks    Status  Achieved      PT SHORT TERM GOAL #2   Title  Patient will increase rightpassive shoulder flexion to 90 degrees     Baseline  140    Time  3    Period  Weeks    Status  Achieved      PT SHORT TERM GOAL #3   Title  Patient will be independent with initial HEP     Baseline  perfroming  at home with cuing     Time  3    Period  Weeks    Status  Achieved      PT SHORT TERM GOAL #4   Title  Patient will demostrate 3/5 right shoulder flexion strength below 90 degrees     Time  4    Period  Weeks    Status  Achieved        PT Long Term Goals - 08/22/17 1224      PT LONG TERM GOAL #1   Title  Patient will demonstrate full left shoulder ER in order to reach behind his head     Time  12    Period  Weeks    Status  New    Target Date  11/14/17      PT LONG TERM GOAL #2   Title  Patient will reach behind his back with the right arm to L1 without pain in order to perform ADL's     Time  12    Period  Weeks    Status  New    Target Date  11/14/17      PT LONG TERM GOAL #3   Title  Patient will reach overhad into a cabinet with a 2lb weight in order to perfrom ADL's     Time  12    Period  Weeks    Status  New    Target Date  11/14/17      PT LONG TERM GOAL #4   Title  Patient will be independent with program that will progress him towards return to tennis     Time  12    Period  Weeks    Status  New    Target Date  11/14/17            Plan - 10/18/17 2113    Clinical Impression Statement  therapy increased weights and bands today. He had no increase in pain. He felt a small amount of pain in his upper trap when reaching to a cabinet. He is making good progress. Per visual inpsection his ER is improving.     Clinical Presentation  Stable    Clinical Decision Making  Low    Rehab  Potential  Good    PT Frequency  2x / week    PT Duration  8 weeks    PT Treatment/Interventions  ADLs/Self Care Home Management;Cryotherapy;Electrical Stimulation;Iontophoresis 4mg /ml Dexamethasone;Gait training;Stair training;Therapeutic activities;Therapeutic exercise;Neuromuscular re-education;Passive range of motion    PT Next Visit Plan  continue with strengthening; progress supine flexion exercises; consider supine protraction; continue RTC strengthening.     PT Home  Exercise Plan  pendulums; wrist flexion/ ext; towel squeeze; scap squeeze, table slides, clasped hand flexion, ER with cane , (red band row, extension, ER, IR )    Consulted and Agree with Plan of Care  Patient       Patient will benefit from skilled therapeutic intervention in order to improve the following deficits and impairments:  Pain, Impaired UE functional use, Decreased safety awareness, Difficulty walking  Visit Diagnosis: Stiffness of right shoulder, not elsewhere classified  Acute pain of right shoulder  Muscle weakness (generalized)  Localized edema     Problem List Patient Active Problem List   Diagnosis Date Noted  . Malignant neoplasm of prostate ([place]) 10/13/2017  . Rotator cuff tear arthropathy of right shoulder 08/31/2017  . Follow up 08/31/2017  . Personal history of gastric ulcer 02/09/2017    Carney Living  PT DPT  10/18/2017, 9:15 PM  [facility] [address] [place], [place], [address] Phone: [phone]   Fax:  [phone]  Name: Roy Reed MRN: [id number] Date of Birth: 1956/01/31

## 2017-10-19 ENCOUNTER — Telehealth: Payer: Self-pay | Admitting: *Deleted

## 2017-10-19 NOTE — Telephone Encounter (Signed)
Called patient's daughter -Judson Roch to ask questions, spoke with daughter.

## 2017-10-21 ENCOUNTER — Encounter: Payer: Self-pay | Admitting: Physical Therapy

## 2017-10-21 ENCOUNTER — Ambulatory Visit: Payer: Self-pay | Admitting: Physical Therapy

## 2017-10-21 DIAGNOSIS — R600 Localized edema: Secondary | ICD-10-CM

## 2017-10-21 DIAGNOSIS — M25611 Stiffness of right shoulder, not elsewhere classified: Secondary | ICD-10-CM

## 2017-10-21 DIAGNOSIS — M25511 Pain in right shoulder: Secondary | ICD-10-CM

## 2017-10-21 DIAGNOSIS — M6281 Muscle weakness (generalized): Secondary | ICD-10-CM

## 2017-10-21 NOTE — Therapy (Signed)
[place], [place], [address] Phone: [phone]   Fax:  [phone]  Physical Therapy Treatment  Patient Details  Name: Roy Reed MRN: [id number] Date of Birth: 15-Jun-1956 Referring Provider: Dr Frankey Shown    Encounter Date: 10/21/2017  PT End of Session - 10/21/17 1021    Visit Number  14    Number of Visits  16    Date for PT Re-Evaluation  11/14/17    Authorization Type  CAFA     PT Start Time  1016    PT Stop Time  1110    PT Time Calculation (min)  54 min    Activity Tolerance  Patient tolerated treatment well    Behavior During Therapy  [facility] for tasks assessed/performed       Past Medical History:  Diagnosis Date  . Elevated PSA   . Prostate cancer ([place])   . Rotator cuff disorder, right     Past Surgical History:  Procedure Laterality Date  . PROSTATE BIOPSY    . PROSTATE BIOPSY    . SHOULDER ARTHROSCOPY WITH ROTATOR CUFF REPAIR AND SUBACROMIAL DECOMPRESSION Right 08/05/2017   Procedure: RIGHT SHOULDER ARTHROSCOPY WITH ROTATOR CUFF REPAIR, DISTAL CLAVICLE EXCISION, DEBRIDEMENT AND SUBACROMIAL DECOMPRESSION;  Surgeon: Leandrew Koyanagi, MD;  Location: [place];  Service: Orthopedics;  Laterality: Right;    There were no vitals filed for this visit.  Subjective Assessment - 10/21/17 1020    Subjective  Patient has had some soreness since the last visit. He has soreness with the internal rotation stretching. He is otherwise pain free.     Limitations  Lifting;Writing;House hold activities    Patient Stated Goals  to return to tennis     Currently in Pain?  No/denies                      [facility] Adult PT Treatment/Exercise - 10/21/17 0001      Shoulder Exercises: Supine   Other Supine Exercises  Supine flexion full range 2x10 D2 flexion 2 x10       Shoulder Exercises: Sidelying   External Rotation Limitations  2x10 1 lb       Shoulder Exercises:  Standing   External Rotation Limitations  --    Internal Rotation Limitations  --    Extension  20 reps;Theraband    Theraband Level (Shoulder Extension)  Level 4 (Blue)    Row  Both;20 reps;Theraband    Theraband Level (Shoulder Row)  Level 4 (Blue)    Other Standing Exercises  --    Other Standing Exercises  UE ranger flexion and horizontals level 15 on wall, from florr IR reach behind, standing, cane IR and extension      Shoulder Exercises: ROM/Strengthening   UBE (Upper Arm Bike)  2 min forward 2 min back       Shoulder Exercises: Stretch   Internal Rotation Stretch  --    Internal Rotation Stretch Limitations  --      Cryotherapy   Number Minutes Cryotherapy  10 Minutes    Cryotherapy Location  Shoulder    Type of Cryotherapy  Ice pack      Manual Therapy   Manual Therapy  Joint mobilization;Passive ROM    Manual therapy comments  PROM into flexion/ ER/ IR     Joint Mobilization  PA glides and inferior gides to improve external rotation; Mulligan IR strap mobilization  PT Education - 10/21/17 1020    Education provided  Yes    Education Details  HEP, stretching, strengthening     Person(s) Educated  Patient    Methods  Explanation;Demonstration;Tactile cues;Verbal cues    Comprehension  Verbalized understanding;Returned demonstration;Tactile cues required;Verbal cues required       PT Short Term Goals - 09/29/17 1505      PT SHORT TERM GOAL #1   Title  Patient will increase passive right ER to 30 degrees     Baseline  40    Time  3    Period  Weeks    Status  Achieved      PT SHORT TERM GOAL #2   Title  Patient will increase rightpassive shoulder flexion to 90 degrees     Baseline  140    Time  3    Period  Weeks    Status  Achieved      PT SHORT TERM GOAL #3   Title  Patient will be independent with initial HEP     Baseline  perfroming at home with cuing     Time  3    Period  Weeks    Status  Achieved      PT SHORT TERM GOAL #4    Title  Patient will demostrate 3/5 right shoulder flexion strength below 90 degrees     Time  4    Period  Weeks    Status  Achieved        PT Long Term Goals - 08/22/17 1224      PT LONG TERM GOAL #1   Title  Patient will demonstrate full left shoulder ER in order to reach behind his head     Time  12    Period  Weeks    Status  New    Target Date  11/14/17      PT LONG TERM GOAL #2   Title  Patient will reach behind his back with the right arm to L1 without pain in order to perform ADL's     Time  12    Period  Weeks    Status  New    Target Date  11/14/17      PT LONG TERM GOAL #3   Title  Patient will reach overhad into a cabinet with a 2lb weight in order to perfrom ADL's     Time  12    Period  Weeks    Status  New    Target Date  11/14/17      PT LONG TERM GOAL #4   Title  Patient will be independent with program that will progress him towards return to tennis     Time  12    Period  Weeks    Status  New    Target Date  11/14/17            Plan - 10/21/17 1054    Clinical Impression Statement  Patient was sore today with ER stretching and strengthening. he reports he has been stretching a lot at home. Therapy scaled his activity back and advised him to take it ewasy over the weekend and ice. He reports he will. Therapy will begin to progresss him again next week.     Clinical Presentation  Stable    Clinical Decision Making  Low    Rehab Potential  Good    PT Frequency  2x / week    PT Duration  8 weeks    PT Treatment/Interventions  ADLs/Self Care Home Management;Cryotherapy;Electrical Stimulation;Iontophoresis 4mg /ml Dexamethasone;Gait training;Stair training;Therapeutic activities;Therapeutic exercise;Neuromuscular re-education;Passive range of motion    PT Next Visit Plan  continue with strengthening; progress supine flexion exercises; consider supine protraction; continue RTC strengthening.     PT Home Exercise Plan  pendulums; wrist flexion/ ext;  towel squeeze; scap squeeze, table slides, clasped hand flexion, ER with cane , (red band row, extension, ER, IR )    Consulted and Agree with Plan of Care  Patient       Patient will benefit from skilled therapeutic intervention in order to improve the following deficits and impairments:  Pain, Impaired UE functional use, Decreased safety awareness, Difficulty walking  Visit Diagnosis: Stiffness of right shoulder, not elsewhere classified  Acute pain of right shoulder  Muscle weakness (generalized)  Localized edema     Problem List Patient Active Problem List   Diagnosis Date Noted  . Malignant neoplasm of prostate ([place]) 10/13/2017  . Rotator cuff tear arthropathy of right shoulder 08/31/2017  . Follow up 08/31/2017  . Personal history of gastric ulcer 02/09/2017    Carney Living 10/21/2017, 11:00 AM  [facility] [address] [place], [place], [address] Phone: [phone]   Fax:  [phone]  Name: Roy Reed MRN: [id number] Date of Birth: 1956/08/29

## 2017-10-25 ENCOUNTER — Encounter: Payer: Self-pay | Admitting: Physical Therapy

## 2017-10-25 ENCOUNTER — Ambulatory Visit: Payer: Self-pay | Admitting: Physical Therapy

## 2017-10-25 DIAGNOSIS — R600 Localized edema: Secondary | ICD-10-CM

## 2017-10-25 DIAGNOSIS — M25511 Pain in right shoulder: Secondary | ICD-10-CM

## 2017-10-25 DIAGNOSIS — M25611 Stiffness of right shoulder, not elsewhere classified: Secondary | ICD-10-CM

## 2017-10-25 DIAGNOSIS — M6281 Muscle weakness (generalized): Secondary | ICD-10-CM

## 2017-10-26 ENCOUNTER — Encounter: Payer: Self-pay | Admitting: Physical Therapy

## 2017-10-26 NOTE — Therapy (Signed)
[place], [place], [address] Phone: [phone]   Fax:  [phone]  Physical Therapy Treatment  Patient Details  Name: Roy Reed MRN: [id number] Date of Birth: 1956/06/04 Referring Provider: Dr Frankey Shown    Encounter Date: 10/25/2017  PT End of Session - 10/25/17 1423    Visit Number  15    Number of Visits  16    Date for PT Re-Evaluation  11/14/17    Authorization Type  CAFA     PT Start Time  1418    PT Stop Time  1459    PT Time Calculation (min)  41 min    Activity Tolerance  Patient tolerated treatment well    Behavior During Therapy  [facility] for tasks assessed/performed       Past Medical History:  Diagnosis Date  . Elevated PSA   . Prostate cancer ([place])   . Rotator cuff disorder, right     Past Surgical History:  Procedure Laterality Date  . PROSTATE BIOPSY    . PROSTATE BIOPSY    . SHOULDER ARTHROSCOPY WITH ROTATOR CUFF REPAIR AND SUBACROMIAL DECOMPRESSION Right 08/05/2017   Procedure: RIGHT SHOULDER ARTHROSCOPY WITH ROTATOR CUFF REPAIR, DISTAL CLAVICLE EXCISION, DEBRIDEMENT AND SUBACROMIAL DECOMPRESSION;  Surgeon: Leandrew Koyanagi, MD;  Location: [place];  Service: Orthopedics;  Laterality: Right;    There were no vitals filed for this visit.  Subjective Assessment - 10/25/17 1450    Subjective  Patient rested over the weekedn and his sorness improved. Therapy added pronbe strengthening. He had no increase in pain with treatment. Therapy was also able to add the cabinet reach     Currently in Pain?  No/denies                      [facility] Adult PT Treatment/Exercise - 10/26/17 0001      Shoulder Exercises: Prone   Retraction Limitations  2lb 20x     Extension Weight (lbs)  2    Extension Limitations  x20      Shoulder Exercises: Sidelying   External Rotation Limitations  2x10 2 lb       Shoulder Exercises: Standing   External Rotation  Limitations  2 x 10 green     Internal Rotation Limitations  2x10 green     Extension  20 reps;Theraband    Theraband Level (Shoulder Extension)  Level 4 (Blue)    Other Standing Exercises  UE ranger flexion and horizontals level 15 on wall, from florr IR reach behind, standing, cane IR and extension      Shoulder Exercises: Pulleys   Flexion  3 minutes      Shoulder Exercises: Stretch   Other Shoulder Stretches  star gazer stretch 5x 10 sec hold       Cryotherapy   Number Minutes Cryotherapy  10 Minutes    Cryotherapy Location  Shoulder    Type of Cryotherapy  Ice pack      Manual Therapy   Manual Therapy  Joint mobilization;Passive ROM    Manual therapy comments  PROM into flexion/ ER/ IR     Joint Mobilization  PA glides and inferior gides to improve external rotation; Mulligan IR strap mobilization             PT Education - 10/26/17 1002    Education provided  Yes    Education Details  advanced weights back to previous level  Person(s) Educated  Patient    Methods  Explanation;Demonstration;Tactile cues;Verbal cues    Comprehension  Verbalized understanding;Verbal cues required;Tactile cues required;Returned demonstration       PT Short Term Goals - 10/26/17 1019      PT SHORT TERM GOAL #1   Title  Patient will increase passive right ER to 30 degrees     Baseline  40    Time  3    Period  Weeks    Status  Achieved      PT SHORT TERM GOAL #2   Title  Patient will increase rightpassive shoulder flexion to 90 degrees     Baseline  140    Time  3    Period  Weeks    Status  Achieved      PT SHORT TERM GOAL #3   Title  Patient will be independent with initial HEP     Baseline  perfroming independently     Time  3    Period  Weeks    Status  Achieved      PT SHORT TERM GOAL #4   Title  Patient will demostrate 3/5 right shoulder flexion strength below 90 degrees     Time  4    Period  Weeks    Status  Achieved        PT Long Term Goals -  08/22/17 1224      PT LONG TERM GOAL #1   Title  Patient will demonstrate full left shoulder ER in order to reach behind his head     Time  12    Period  Weeks    Status  New    Target Date  11/14/17      PT LONG TERM GOAL #2   Title  Patient will reach behind his back with the right arm to L1 without pain in order to perform ADL's     Time  12    Period  Weeks    Status  New    Target Date  11/14/17      PT LONG TERM GOAL #3   Title  Patient will reach overhad into a cabinet with a 2lb weight in order to perfrom ADL's     Time  12    Period  Weeks    Status  New    Target Date  11/14/17      PT LONG TERM GOAL #4   Title  Patient will be independent with program that will progress him towards return to tennis     Time  12    Period  Weeks    Status  New    Target Date  11/14/17            Plan - 10/26/17 1004    Clinical Impression Statement  Patients soreness has improved. Therapy was able to add weights back into her exercises. He had no increase in pain with treatment. He will be re-assessed necxt visit. He was given the star gazer stretch to work on at home. He continues to be limited in external rotation motion.     Clinical Presentation  Stable    Clinical Decision Making  Low    Rehab Potential  Good    PT Frequency  2x / week    PT Duration  8 weeks    PT Treatment/Interventions  ADLs/Self Care Home Management;Cryotherapy;Electrical Stimulation;Iontophoresis 4mg /ml Dexamethasone;Gait training;Stair training;Therapeutic activities;Therapeutic exercise;Neuromuscular re-education;Passive range of motion    PT Next  Visit Plan  continue with strengthening; progress supine flexion exercises; consider supine protraction; continue RTC strengthening.     PT Home Exercise Plan  pendulums; wrist flexion/ ext; towel squeeze; scap squeeze, table slides, clasped hand flexion, ER with cane , (red band row, extension, ER, IR )    Consulted and Agree with Plan of Care  Patient        Patient will benefit from skilled therapeutic intervention in order to improve the following deficits and impairments:  Pain, Impaired UE functional use, Decreased safety awareness, Difficulty walking  Visit Diagnosis: Stiffness of right shoulder, not elsewhere classified  Acute pain of right shoulder  Muscle weakness (generalized)  Localized edema     Problem List Patient Active Problem List   Diagnosis Date Noted  . Malignant neoplasm of prostate ([place]) 10/13/2017  . Rotator cuff tear arthropathy of right shoulder 08/31/2017  . Follow up 08/31/2017  . Personal history of gastric ulcer 02/09/2017    Carney Living PT DPT  10/26/2017, 10:28 AM  [facility] [address] [place], [place], [address] Phone: [phone]   Fax:  [phone]  Name: Roy Reed MRN: [id number] Date of Birth: 06-01-1956

## 2017-10-27 ENCOUNTER — Ambulatory Visit: Payer: Self-pay | Admitting: Physical Therapy

## 2017-10-27 ENCOUNTER — Encounter: Payer: Self-pay | Admitting: Physical Therapy

## 2017-10-27 ENCOUNTER — Ambulatory Visit: Payer: Self-pay | Admitting: Radiation Oncology

## 2017-10-27 DIAGNOSIS — M6281 Muscle weakness (generalized): Secondary | ICD-10-CM

## 2017-10-27 DIAGNOSIS — M25511 Pain in right shoulder: Secondary | ICD-10-CM

## 2017-10-27 DIAGNOSIS — M25611 Stiffness of right shoulder, not elsewhere classified: Secondary | ICD-10-CM

## 2017-10-27 DIAGNOSIS — R600 Localized edema: Secondary | ICD-10-CM

## 2017-10-27 NOTE — Therapy (Signed)
[place], [place], [address] Phone: [phone]   Fax:  [phone]  Physical Therapy Treatment  Patient Details  Name: Roy Reed MRN: [id number] Date of Birth: [birth year] Referring Provider: Dr Frankey Shown    Encounter Date: 10/27/2017  PT End of Session - 10/27/17 1419    Visit Number  16    Number of Visits  20    Date for PT Re-Evaluation  11/14/17    Authorization Type  CAFA     PT Start Time  1413    PT Stop Time  1454    PT Time Calculation (min)  41 min    Activity Tolerance  Patient tolerated treatment well    Behavior During Therapy  [facility] for tasks assessed/performed       Past Medical History:  Diagnosis Date  . Elevated PSA   . Prostate cancer ([place])   . Rotator cuff disorder, right     Past Surgical History:  Procedure Laterality Date  . PROSTATE BIOPSY    . PROSTATE BIOPSY    . SHOULDER ARTHROSCOPY WITH ROTATOR CUFF REPAIR AND SUBACROMIAL DECOMPRESSION Right 08/05/2017   Procedure: RIGHT SHOULDER ARTHROSCOPY WITH ROTATOR CUFF REPAIR, DISTAL CLAVICLE EXCISION, DEBRIDEMENT AND SUBACROMIAL DECOMPRESSION;  Surgeon: Leandrew Koyanagi, MD;  Location: [place];  Service: Orthopedics;  Laterality: Right;    There were no vitals filed for this visit.  Subjective Assessment - 10/27/17 1418    Subjective  Patient reports no pain and soreness after the last visit. He has been working on his exercises.     Limitations  Lifting;Writing;House hold activities    Patient Stated Goals  to return to tennis     Currently in Pain?  No/denies         [facility] PT Assessment - 10/27/17 0001      AROM   Right Shoulder Flexion  125 Degrees    Right Shoulder Internal Rotation  -- can still only reach to his belt loop     Right Shoulder External Rotation  -- needs to move his head forward to reach the back of his head      Strength   Strength Assessment Site  Shoulder    Right/Left  Shoulder  Right;Left    Right Shoulder Flexion  4+/5    Right Shoulder Internal Rotation  4+/5    Right Shoulder External Rotation  4+/5                  [facility] Adult PT Treatment/Exercise - 10/27/17 0001      Shoulder Exercises: Prone   Retraction Limitations  2lb 20x     Extension Weight (lbs)  2    Extension Limitations  x20      Shoulder Exercises: Sidelying   External Rotation Limitations  2x10 2 lb       Shoulder Exercises: Standing   External Rotation Limitations  2 x 10 green     Internal Rotation Limitations  2x10 blue     Extension  20 reps;Theraband    Theraband Level (Shoulder Extension)  Level 4 (Blue)    Other Standing Exercises  --      Shoulder Exercises: Pulleys   Flexion  3 minutes      Shoulder Exercises: Stretch   Other Shoulder Stretches  star gazer stretch 5x 10 sec hold       Cryotherapy   Cryotherapy Location  Shoulder      Manual  Therapy   Manual Therapy  Joint mobilization;Passive ROM    Manual therapy comments  PROM into flexion/ ER/ IR     Joint Mobilization  PA glides and inferior gides to improve external rotation; Mulligan IR strap mobilization             PT Education - 10/27/17 1418    Education provided  Yes    Education Details  reviewed technique with exercises     Person(s) Educated  Patient    Methods  Explanation;Tactile cues;Demonstration;Verbal cues    Comprehension  Verbalized understanding;Returned demonstration;Verbal cues required;Tactile cues required       PT Short Term Goals - 10/26/17 1019      PT SHORT TERM GOAL #1   Title  Patient will increase passive right ER to 30 degrees     Baseline  40    Time  3    Period  Weeks    Status  Achieved      PT SHORT TERM GOAL #2   Title  Patient will increase rightpassive shoulder flexion to 90 degrees     Baseline  140    Time  3    Period  Weeks    Status  Achieved      PT SHORT TERM GOAL #3   Title  Patient will be independent with initial HEP      Baseline  perfroming independently     Time  3    Period  Weeks    Status  Achieved      PT SHORT TERM GOAL #4   Title  Patient will demostrate 3/5 right shoulder flexion strength below 90 degrees     Time  4    Period  Weeks    Status  Achieved        PT Long Term Goals - 08/22/17 1224      PT LONG TERM GOAL #1   Title  Patient will demonstrate full left shoulder ER in order to reach behind his head     Time  12    Period  Weeks    Status  New    Target Date  11/14/17      PT LONG TERM GOAL #2   Title  Patient will reach behind his back with the right arm to L1 without pain in order to perform ADL's     Time  12    Period  Weeks    Status  New    Target Date  11/14/17      PT LONG TERM GOAL #3   Title  Patient will reach overhad into a cabinet with a 2lb weight in order to perfrom ADL's     Time  12    Period  Weeks    Status  New    Target Date  11/14/17      PT LONG TERM GOAL #4   Title  Patient will be independent with program that will progress him towards return to tennis     Time  12    Period  Weeks    Status  New    Target Date  11/14/17            Plan - 10/27/17 1601    Clinical Impression Statement  The patient is progressing but still has deficits. He has limited activeinternal rotation. he has been working on stretvching but has had some soreness. He is also lacking passive external rotation. Functionaly he can reach behind  his head. His strength in his available range has improved significnatly. The patient has a good HEP for home. he feels like he can continue to progress at home. He has 2 more visits scheduled. Therapy will continue stretching and review final HEP.     Clinical Presentation  Stable    Clinical Decision Making  Low    Rehab Potential  Good    PT Frequency  2x / week    PT Duration  8 weeks    PT Treatment/Interventions  ADLs/Self Care Home Management;Cryotherapy;Electrical Stimulation;Iontophoresis 4mg /ml Dexamethasone;Gait  training;Stair training;Therapeutic activities;Therapeutic exercise;Neuromuscular re-education;Passive range of motion    PT Next Visit Plan  continue with strengthening; progress supine flexion exercises; consider supine protraction; continue RTC strengthening.     PT Home Exercise Plan  pendulums; wrist flexion/ ext; towel squeeze; scap squeeze, table slides, clasped hand flexion, ER with cane , (red band row, extension, ER, IR )    Consulted and Agree with Plan of Care  Patient       Patient will benefit from skilled therapeutic intervention in order to improve the following deficits and impairments:     Visit Diagnosis: Stiffness of right shoulder, not elsewhere classified - Plan: PT plan of care cert/re-cert  Acute pain of right shoulder - Plan: PT plan of care cert/re-cert  Muscle weakness (generalized) - Plan: PT plan of care cert/re-cert  Localized edema - Plan: PT plan of care cert/re-cert     Problem List Patient Active Problem List   Diagnosis Date Noted  . Malignant neoplasm of prostate ([place]) 10/13/2017  . Rotator cuff tear arthropathy of right shoulder 08/31/2017  . Follow up 08/31/2017  . Personal history of gastric ulcer 02/09/2017    Carney Living  PT DPT  10/27/2017, 5:18 PM  [facility] [address] [place], [place], [address] Phone: [phone]   Fax:  [phone]  Name: Sten Dematteo MRN: [id number] Date of Birth: 1956-04-24

## 2017-10-31 ENCOUNTER — Telehealth: Payer: Self-pay | Admitting: *Deleted

## 2017-10-31 ENCOUNTER — Encounter (INDEPENDENT_AMBULATORY_CARE_PROVIDER_SITE_OTHER): Payer: Self-pay | Admitting: Orthopaedic Surgery

## 2017-10-31 ENCOUNTER — Ambulatory Visit (INDEPENDENT_AMBULATORY_CARE_PROVIDER_SITE_OTHER): Payer: Self-pay | Admitting: Orthopaedic Surgery

## 2017-10-31 DIAGNOSIS — M7541 Impingement syndrome of right shoulder: Secondary | ICD-10-CM

## 2017-10-31 DIAGNOSIS — M75101 Unspecified rotator cuff tear or rupture of right shoulder, not specified as traumatic: Secondary | ICD-10-CM

## 2017-10-31 NOTE — Progress Notes (Signed)
Patient is almost 3 months status post right shoulder arthroscopy with rotator cuff repair.  He is doing much better.  He has some referred pain to his right deltoid region.  He is progressing with physical therapy which she has twice a week.  His main complaint is stiffness.  Physical exam shows improving range of motion however he does have limitation with internal rotation to beltline and forward flexion to about 95 degrees.  From ice to allow him to continue with home exercises and physical therapy for aggressive joint mobilization.  Continue with strengthening.  Recheck in 6 weeks.  Patient in agreement

## 2017-10-31 NOTE — Telephone Encounter (Signed)
CALLED PATIENT'S [facility] @ [phone] AND INFORMED OF PRE-SEED PLANNING CT ON 11-11-17 AND HIS LABS ON 12-26-17 AND HIS IMPLANT ON 01-02-18, SPOKE WITH PATIENT'S DAUGHTER [facility] AND SHE IS AWARE OF THESE APPTS.

## 2017-11-01 ENCOUNTER — Encounter: Payer: Self-pay | Admitting: Physical Therapy

## 2017-11-01 ENCOUNTER — Ambulatory Visit: Payer: Self-pay | Admitting: Physical Therapy

## 2017-11-01 DIAGNOSIS — R600 Localized edema: Secondary | ICD-10-CM

## 2017-11-01 DIAGNOSIS — M6281 Muscle weakness (generalized): Secondary | ICD-10-CM

## 2017-11-01 DIAGNOSIS — M25511 Pain in right shoulder: Secondary | ICD-10-CM

## 2017-11-01 DIAGNOSIS — M25611 Stiffness of right shoulder, not elsewhere classified: Secondary | ICD-10-CM

## 2017-11-01 NOTE — Therapy (Signed)
[place], [place], [address] Phone: [phone]   Fax:  [phone]  Physical Therapy Treatment  Patient Details  Name: Roy Reed MRN: [id number] Date of Birth: [birth year] Referring Provider: Dr Frankey Shown    Encounter Date: 11/01/2017  PT End of Session - 11/01/17 1527    Visit Number  17    Number of Visits  20    Date for PT Re-Evaluation  11/14/17    Authorization Type  CAFA     PT Start Time  1415    PT Stop Time  1507    PT Time Calculation (min)  52 min    Activity Tolerance  Patient tolerated treatment well    Behavior During Therapy  [facility] for tasks assessed/performed       Past Medical History:  Diagnosis Date  . Elevated PSA   . Prostate cancer ([place])   . Rotator cuff disorder, right     Past Surgical History:  Procedure Laterality Date  . PROSTATE BIOPSY    . PROSTATE BIOPSY    . SHOULDER ARTHROSCOPY WITH ROTATOR CUFF REPAIR AND SUBACROMIAL DECOMPRESSION Right 08/05/2017   Procedure: RIGHT SHOULDER ARTHROSCOPY WITH ROTATOR CUFF REPAIR, DISTAL CLAVICLE EXCISION, DEBRIDEMENT AND SUBACROMIAL DECOMPRESSION;  Surgeon: Leandrew Koyanagi, MD;  Location: [place];  Service: Orthopedics;  Laterality: Right;    There were no vitals filed for this visit.  Subjective Assessment - 11/01/17 1420    Subjective  Patient has been to the MD who wants more agressive ROM. He reports his shoulder is about the same.     Limitations  Lifting;Writing;House hold activities    Currently in Pain?  No/denies    Pain Score  3     Pain Location  Shoulder    Pain Orientation  Right    Pain Descriptors / Indicators  Sharp    Pain Type  Surgical pain    Pain Onset  1 to 4 weeks ago    Pain Frequency  Constant    Aggravating Factors   certain movements                       [facility] Adult PT Treatment/Exercise - 11/01/17 0001      Shoulder Exercises: Supine   Other Supine  Exercises  supine wand ER 2x10       Shoulder Exercises: Prone   Retraction Limitations  2lb 20x     Extension Weight (lbs)  2    Extension Limitations  x20      Shoulder Exercises: Sidelying   External Rotation Limitations  2x10 2 lb     Other Sidelying Exercises  sleeper stretch 5x 10 secod hold       Shoulder Exercises: Standing   External Rotation Limitations  2 x 10 green     Internal Rotation Limitations  2x10 blue     Extension  20 reps;Theraband    Theraband Level (Shoulder Extension)  Level 4 (Blue)    Row  Both;20 reps;Theraband    Theraband Level (Shoulder Row)  Level 4 (Blue)      Shoulder Exercises: Stretch   Other Shoulder Stretches  star gazer stretch 5x 10 sec hold       Cryotherapy   Number Minutes Cryotherapy  10 Minutes    Cryotherapy Location  Shoulder    Type of Cryotherapy  Ice pack      Manual Therapy   Manual Therapy  Joint mobilization;Passive ROM    Manual therapy comments  PROM into flexion/ ER/ IR     Joint Mobilization  PA glides and inferior gides to improve external rotation; Mulligan IR strap mobilization             PT Education - 11/01/17 1527    Education provided  Yes    Education Details  reviewed exercises     Person(s) Educated  Patient    Methods  Explanation;Demonstration;Tactile cues;Verbal cues    Comprehension  Verbalized understanding;Returned demonstration;Verbal cues required;Tactile cues required       PT Short Term Goals - 11/01/17 1529      PT SHORT TERM GOAL #1   Title  Patient will increase passive right ER to 30 degrees     Baseline  40    Time  3    Period  Weeks    Status  Achieved      PT SHORT TERM GOAL #2   Title  Patient will increase rightpassive shoulder flexion to 90 degrees     Baseline  140    Time  3    Period  Weeks    Status  Achieved      PT SHORT TERM GOAL #3   Title  Patient will be independent with initial HEP     Baseline  perfroming independently     Time  3    Period  Weeks     Status  Achieved      PT SHORT TERM GOAL #4   Title  Patient will demostrate 3/5 right shoulder flexion strength below 90 degrees     Time  4    Period  Weeks    Status  Achieved        PT Long Term Goals - 08/22/17 1224      PT LONG TERM GOAL #1   Title  Patient will demonstrate full left shoulder ER in order to reach behind his head     Time  12    Period  Weeks    Status  New    Target Date  11/14/17      PT LONG TERM GOAL #2   Title  Patient will reach behind his back with the right arm to L1 without pain in order to perform ADL's     Time  12    Period  Weeks    Status  New    Target Date  11/14/17      PT LONG TERM GOAL #3   Title  Patient will reach overhad into a cabinet with a 2lb weight in order to perfrom ADL's     Time  12    Period  Weeks    Status  New    Target Date  11/14/17      PT LONG TERM GOAL #4   Title  Patient will be independent with program that will progress him towards return to tennis     Time  12    Period  Weeks    Status  New    Target Date  11/14/17            Plan - 11/01/17 1528    Clinical Impression Statement  Per MD note therapy was morre agressive with motion. He reported minor pain with IR stretching. Patient was given 3 stretches to emphasize at home. He continues to have tight IR and ER.     Clinical Presentation  Stable  Clinical Decision Making  Low    Rehab Potential  Good    PT Frequency  2x / week    PT Treatment/Interventions  ADLs/Self Care Home Management;Cryotherapy;Electrical Stimulation;Iontophoresis 4mg /ml Dexamethasone;Gait training;Stair training;Therapeutic activities;Therapeutic exercise;Neuromuscular re-education;Passive range of motion    PT Next Visit Plan  continue with strengthening; progress supine flexion exercises; consider supine protraction; continue RTC strengthening.     PT Home Exercise Plan  pendulums; wrist flexion/ ext; towel squeeze; scap squeeze, table slides, clasped hand flexion,  ER with cane , (red band row, extension, ER, IR )    Consulted and Agree with Plan of Care  Patient       Patient will benefit from skilled therapeutic intervention in order to improve the following deficits and impairments:  Pain, Impaired UE functional use, Decreased safety awareness, Difficulty walking  Visit Diagnosis: Stiffness of right shoulder, not elsewhere classified  Acute pain of right shoulder  Muscle weakness (generalized)  Localized edema     Problem List Patient Active Problem List   Diagnosis Date Noted  . Malignant neoplasm of prostate ([place]) 10/13/2017  . Rotator cuff tear arthropathy of right shoulder 08/31/2017  . Follow up 08/31/2017  . Personal history of gastric ulcer 02/09/2017    Carney Living PT DPT  11/01/2017, 3:32 PM  [facility] [address] [place], [place], [address] Phone: [phone]   Fax:  [phone]  Name: Roy Reed MRN: [id number] Date of Birth: [birth year]

## 2017-11-02 ENCOUNTER — Ambulatory Visit (INDEPENDENT_AMBULATORY_CARE_PROVIDER_SITE_OTHER): Payer: Self-pay | Admitting: Urology

## 2017-11-02 DIAGNOSIS — C61 Malignant neoplasm of prostate: Secondary | ICD-10-CM

## 2017-11-02 MED FILL — MEGESTROL 20 MG TABLET: 20 | 30 days supply | Qty: 60 | Fill #0

## 2017-11-02 MED FILL — IMIQUIMOD 5% CREAM PACKET: 5 | 28 days supply | Qty: 12 | Fill #0

## 2017-11-03 ENCOUNTER — Ambulatory Visit: Payer: Self-pay | Attending: Internal Medicine | Admitting: Internal Medicine

## 2017-11-03 ENCOUNTER — Ambulatory Visit: Payer: Self-pay | Admitting: Physical Therapy

## 2017-11-03 ENCOUNTER — Encounter: Payer: Self-pay | Admitting: Physical Therapy

## 2017-11-03 ENCOUNTER — Encounter: Payer: Self-pay | Admitting: Internal Medicine

## 2017-11-03 ENCOUNTER — Other Ambulatory Visit: Payer: Self-pay | Admitting: Urology

## 2017-11-03 VITALS — BP 112/74 | HR 88 | Temp 98.5°F | Resp 16 | Ht 75.0 in | Wt 243.4 lb

## 2017-11-03 DIAGNOSIS — R600 Localized edema: Secondary | ICD-10-CM

## 2017-11-03 DIAGNOSIS — R7303 Prediabetes: Secondary | ICD-10-CM | POA: Insufficient documentation

## 2017-11-03 DIAGNOSIS — C61 Malignant neoplasm of prostate: Secondary | ICD-10-CM | POA: Insufficient documentation

## 2017-11-03 DIAGNOSIS — M25511 Pain in right shoulder: Secondary | ICD-10-CM

## 2017-11-03 DIAGNOSIS — M25611 Stiffness of right shoulder, not elsewhere classified: Secondary | ICD-10-CM

## 2017-11-03 DIAGNOSIS — Z9889 Other specified postprocedural states: Secondary | ICD-10-CM | POA: Insufficient documentation

## 2017-11-03 DIAGNOSIS — M6281 Muscle weakness (generalized): Secondary | ICD-10-CM

## 2017-11-03 DIAGNOSIS — Z886 Allergy status to analgesic agent status: Secondary | ICD-10-CM | POA: Insufficient documentation

## 2017-11-03 DIAGNOSIS — Z7689 Persons encountering health services in other specified circumstances: Secondary | ICD-10-CM | POA: Insufficient documentation

## 2017-11-03 NOTE — Therapy (Signed)
[place], [place], [address] Phone: [phone]   Fax:  [phone]  Physical Therapy Treatment  Patient Details  Name: Roy Reed MRN: [id number] Date of Birth: August 28, 1956 Referring Provider: Dr Frankey Shown    Encounter Date: 11/03/2017  PT End of Session - 11/03/17 1420    Visit Number  18    Number of Visits  20    Date for PT Re-Evaluation  11/14/17    Authorization Type  CAFA     PT Start Time  1416    PT Stop Time  1457    PT Time Calculation (min)  41 min    Activity Tolerance  Patient tolerated treatment well    Behavior During Therapy  [facility] for tasks assessed/performed       Past Medical History:  Diagnosis Date  . Elevated PSA   . Prostate cancer ([place])   . Rotator cuff disorder, right     Past Surgical History:  Procedure Laterality Date  . PROSTATE BIOPSY    . PROSTATE BIOPSY    . SHOULDER ARTHROSCOPY WITH ROTATOR CUFF REPAIR AND SUBACROMIAL DECOMPRESSION Right 08/05/2017   Procedure: RIGHT SHOULDER ARTHROSCOPY WITH ROTATOR CUFF REPAIR, DISTAL CLAVICLE EXCISION, DEBRIDEMENT AND SUBACROMIAL DECOMPRESSION;  Surgeon: Leandrew Koyanagi, MD;  Location: [place];  Service: Orthopedics;  Laterality: Right;    There were no vitals filed for this visit.  Subjective Assessment - 11/03/17 1419    Subjective  Patient reports his shoulder was not too sore after the last visit despite agressive ROM.     Limitations  Lifting;Writing;House hold activities    Patient Stated Goals  to return to tennis     Currently in Pain?  Yes    Pain Score  2     Pain Location  Shoulder    Pain Orientation  Right    Pain Descriptors / Indicators  Aching    Pain Type  Surgical pain    Pain Onset  1 to 4 weeks ago    Pain Frequency  Constant    Aggravating Factors   certain movements     Pain Relieving Factors  position changes     Effect of Pain on Daily Activities  difficulty perfroming  daily tasks                       [facility] Adult PT Treatment/Exercise - 11/03/17 0001      Shoulder Exercises: Prone   Retraction Limitations  4lb 20x     Extension Weight (lbs)  4lb     Extension Limitations  x20      Shoulder Exercises: Sidelying   External Rotation Limitations  2x10 2 lb     Other Sidelying Exercises  sleeper stretch 5x 10 secod hold       Shoulder Exercises: ROM/Strengthening   Other ROM/Strengthening Exercises  row machine x15 each handle 25lbs     Other ROM/Strengthening Exercises  lat pull down 2x10       Shoulder Exercises: Stretch   Other Shoulder Stretches  star gazer stretch 5x 10 sec hold       Cryotherapy   Number Minutes Cryotherapy  -- declined       Manual Therapy   Manual Therapy  Joint mobilization;Passive ROM    Manual therapy comments  PROM into flexion/ ER/ IR     Joint Mobilization  PA glides and inferior gides to improve external rotation; Janyce Llanos  IR strap mobilization             PT Education - 11/03/17 1420    Education provided  Yes    Education Details  reviewed HEP and exercises     Person(s) Educated  Patient    Methods  Explanation;Demonstration;Tactile cues;Verbal cues    Comprehension  Verbalized understanding;Returned demonstration;Verbal cues required;Tactile cues required       PT Short Term Goals - 11/03/17 1513      PT SHORT TERM GOAL #1   Title  Patient will increase passive right ER to 30 degrees     Baseline  40    Time  3    Period  Weeks    Status  Achieved      PT SHORT TERM GOAL #2   Title  Patient will increase rightpassive shoulder flexion to 90 degrees     Baseline  140    Time  3    Period  Weeks    Status  Achieved      PT SHORT TERM GOAL #3   Title  Patient will be independent with initial HEP     Baseline  perfroming independently     Time  3    Period  Weeks    Status  Achieved        PT Long Term Goals - 08/22/17 1224      PT LONG TERM GOAL #1   Title  Patient  will demonstrate full left shoulder ER in order to reach behind his head     Time  12    Period  Weeks    Status  New    Target Date  11/14/17      PT LONG TERM GOAL #2   Title  Patient will reach behind his back with the right arm to L1 without pain in order to perform ADL's     Time  12    Period  Weeks    Status  New    Target Date  11/14/17      PT LONG TERM GOAL #3   Title  Patient will reach overhad into a cabinet with a 2lb weight in order to perfrom ADL's     Time  12    Period  Weeks    Status  New    Target Date  11/14/17      PT LONG TERM GOAL #4   Title  Patient will be independent with program that will progress him towards return to tennis     Time  12    Period  Weeks    Status  New    Target Date  11/14/17            Plan - 11/03/17 1441    Clinical Impression Statement  Range of motion improved with stretching. He had a minor increase in pain with ROM,. Overall his strength is progressing per visual inspection. Patient was enocuraged to cointinue with the 3 stretches he was given for home.     Clinical Presentation  Stable    Rehab Potential  Good    PT Frequency  2x / week    PT Duration  8 weeks    PT Treatment/Interventions  ADLs/Self Care Home Management;Cryotherapy;Electrical Stimulation;Iontophoresis 4mg /ml Dexamethasone;Gait training;Stair training;Therapeutic activities;Therapeutic exercise;Neuromuscular re-education;Passive range of motion    PT Next Visit Plan  continue with strengthening; progress supine flexion exercises; consider supine protraction; continue RTC strengthening.     PT Home Exercise  Plan  pendulums; wrist flexion/ ext; towel squeeze; scap squeeze, table slides, clasped hand flexion, ER with cane , (red band row, extension, ER, IR )    Consulted and Agree with Plan of Care  Patient       Patient will benefit from skilled therapeutic intervention in order to improve the following deficits and impairments:  Pain, Impaired UE  functional use, Decreased safety awareness, Difficulty walking  Visit Diagnosis: Stiffness of right shoulder, not elsewhere classified  Acute pain of right shoulder  Muscle weakness (generalized)  Localized edema     Problem List Patient Active Problem List   Diagnosis Date Noted  . Malignant neoplasm of prostate ([place]) 10/13/2017  . Rotator cuff tear arthropathy of right shoulder 08/31/2017  . Follow up 08/31/2017  . Personal history of gastric ulcer 02/09/2017    Carney Living PT DPT  11/03/2017, 3:26 PM  [facility] [address] [place], [place], [address] Phone: [phone]   Fax:  [phone]  Name: Roy Reed MRN: [id number] Date of Birth: Oct 23, 1955

## 2017-11-03 NOTE — Progress Notes (Signed)
Patient ID: Roy Reed, male    DOB: [birth year]  MRN: [id number]  CC: re-establish   Subjective: Roy Reed is a 62 y.o. male who presents  To est care with me as PCP.  Last saw Roy Reed who has left the practice. His concerns today include:  Pt with hx of pre-DM, rotator cuff tear s/p RT shoulder arthroscopy and elev PSA  1.  RT shoulder:  Had surgery 3 mths ago by Roy Reed.  Currently doing P.T.  Improving slowly  2.  Elevated PSA:  Dx with early stage prostate CA. Followed by Roy Reed at urology Will be treated with hormone and XRT seed implants. Had 3 hormone shots so far.  -no fhx of prostate CA  3.  Pre-DM: walking daily 2-3 x a wk for 1.5 hrs Tries to avoid sugary snacks  Patient Active Problem List   Diagnosis Date Noted  . Malignant neoplasm of prostate ([place]) 10/13/2017  . Rotator cuff tear arthropathy of right shoulder 08/31/2017  . Follow up 08/31/2017  . Personal history of gastric ulcer 02/09/2017     Current Outpatient Medications on File Prior to Visit  Medication Sig Dispense Refill  . oxyCODONE-acetaminophen (PERCOCET) 5-325 MG tablet Take 1-2 tablets by mouth every 4 (four) hours as needed for severe pain. (Patient not taking: Reported on 08/31/2017) 30 tablet 0   No current facility-administered medications on file prior to visit.     Allergies  Allergen Reactions  . Aspirin     States he can take if he takes acid reducer medicine first    Social History   Socioeconomic History  . Marital status: Married    Spouse name: Not on file  . Number of children: Not on file  . Years of education: Not on file  . Highest education level: Not on file  Social Needs  . Financial resource strain: Not on file  . Food insecurity - worry: Not on file  . Food insecurity - inability: Not on file  . Transportation needs - medical: Not on file  . Transportation needs - non-medical: Not on file  Occupational History  . Not on file  Tobacco Use    . Smoking status: Never Smoker  . Smokeless tobacco: Never Used  Substance and Sexual Activity  . Alcohol use: Yes    Comment: social  . Drug use: No  . Sexual activity: Not on file  Other Topics Concern  . Not on file  Social History Narrative  . Not on file    No family history on file.  Past Surgical History:  Procedure Laterality Date  . PROSTATE BIOPSY    . PROSTATE BIOPSY    . SHOULDER ARTHROSCOPY WITH ROTATOR CUFF REPAIR AND SUBACROMIAL DECOMPRESSION Right 08/05/2017   Procedure: RIGHT SHOULDER ARTHROSCOPY WITH ROTATOR CUFF REPAIR, DISTAL CLAVICLE EXCISION, DEBRIDEMENT AND SUBACROMIAL DECOMPRESSION;  Surgeon: Roy Koyanagi, MD;  Location: [facility];  Service: Orthopedics;  Laterality: Right;    ROS: Review of Systems Negative except as stated above PHYSICAL EXAM: BP 112/74   Pulse 88   Temp 98.5 F (36.9 C) (Oral)   Resp 16   Ht 6\' 3"  (1.905 m)   Wt 243 lb 6.4 oz (110.4 kg)   SpO2 98%   BMI 30.42 kg/m   Wt Readings from Last 3 Encounters:  11/03/17 243 lb 6.4 oz (110.4 kg)  10/14/17 243 lb (110.2 kg)  08/31/17 236 lb (107 kg)    Physical Exam  General appearance - alert, well appearing, and in no distress Mental status - alert, oriented to person, place, and time, normal mood, behavior, speech, dress, motor activity, and thought processes Ears - bilateral TM's and external ear canals normal Nose - normal and patent, no erythema, discharge or polyps Mouth - mucous membranes moist, pharynx normal without lesions Neck - supple, no significant adenopathy Chest - clear to auscultation, no wheezes, rales or rhonchi, symmetric air entry Heart - normal rate, regular rhythm, normal S1, S2, no murmurs, rubs, clicks or gallops Ext:  No edema  ASSESSMENT AND PLAN: 1. Prediabetes -Encouraged him to continue healthy eating habits and regular exercise to prevent diabetes - Comprehensive metabolic panel - Lipid panel  2. Prostate cancer  [facility]) Being managed by urology   Patient was given the opportunity to ask questions.  Patient verbalized understanding of the plan and was able to repeat key elements of the plan.  [employer] interpreter used during this encounter.  Orders Placed This Encounter  Procedures  . Comprehensive metabolic panel  . Lipid panel     Requested Prescriptions    No prescriptions requested or ordered in this encounter    Return in about 4 months (around 03/03/2018).  Karle Plumber, MD, FACP

## 2017-11-04 LAB — COMPREHENSIVE METABOLIC PANEL
A/G RATIO: 1.3 (ref 1.2–2.2)
ALK PHOS: 89 IU/L (ref 39–117)
ALT: 53 IU/L — ABNORMAL HIGH (ref 0–44)
AST: 37 IU/L (ref 0–40)
Albumin: 4.7 g/dL (ref 3.6–4.8)
BILIRUBIN TOTAL: 0.3 mg/dL (ref 0.0–1.2)
BUN/Creatinine Ratio: 16 (ref 10–24)
BUN: 15 mg/dL (ref 8–27)
CALCIUM: 9.9 mg/dL (ref 8.6–10.2)
CHLORIDE: 98 mmol/L (ref 96–106)
CO2: 24 mmol/L (ref 20–29)
Creatinine, Ser: 0.91 mg/dL (ref 0.76–1.27)
GFR calc Af Amer: 103 mL/min/{1.73_m2} (ref >59)
GFR, EST NON AFRICAN AMERICAN: 89 mL/min/{1.73_m2} (ref >59)
GLOBULIN, TOTAL: 3.5 g/dL (ref 1.5–4.5)
Glucose: 82 mg/dL (ref 65–99)
POTASSIUM: 4.4 mmol/L (ref 3.5–5.2)
SODIUM: 139 mmol/L (ref 134–144)
Total Protein: 8.2 g/dL (ref 6.0–8.5)

## 2017-11-04 LAB — LIPID PANEL
CHOL/HDL RATIO: 4.8 ratio (ref 0.0–5.0)
Cholesterol, Total: 276 mg/dL — ABNORMAL HIGH (ref 100–199)
HDL: 57 mg/dL (ref >39)
LDL Calculated: 198 mg/dL — ABNORMAL HIGH (ref 0–99)
Triglycerides: 104 mg/dL (ref 0–149)
VLDL Cholesterol Cal: 21 mg/dL (ref 5–40)

## 2017-11-05 ENCOUNTER — Other Ambulatory Visit: Payer: Self-pay | Admitting: Internal Medicine

## 2017-11-05 MED ORDER — ATORVASTATIN CALCIUM 10 MG PO TABS: 10.0000 mg | ORAL_TABLET | Freq: Every day | ORAL | 3 refills | Status: DC

## 2017-11-07 MED FILL — ?ATORVASTATIN 10 MG TABLET: 10 | 30 days supply | Qty: 30 | Fill #0

## 2017-11-09 ENCOUNTER — Ambulatory Visit: Payer: Self-pay | Attending: Orthopaedic Surgery | Admitting: Physical Therapy

## 2017-11-09 ENCOUNTER — Encounter: Payer: Self-pay | Admitting: Physical Therapy

## 2017-11-09 DIAGNOSIS — M25511 Pain in right shoulder: Secondary | ICD-10-CM | POA: Insufficient documentation

## 2017-11-09 DIAGNOSIS — M6281 Muscle weakness (generalized): Secondary | ICD-10-CM | POA: Insufficient documentation

## 2017-11-09 DIAGNOSIS — R600 Localized edema: Secondary | ICD-10-CM | POA: Insufficient documentation

## 2017-11-09 DIAGNOSIS — M25611 Stiffness of right shoulder, not elsewhere classified: Secondary | ICD-10-CM | POA: Insufficient documentation

## 2017-11-09 NOTE — Therapy (Signed)
[place], [place], [address] Phone: [phone]   Fax:  [phone]  Physical Therapy Treatment  Patient Details  Name: Roy Reed MRN: [id number] Date of Birth: [birth year] Referring Provider: Dr Frankey Shown    Encounter Date: 11/09/2017  PT End of Session - 11/09/17 1808    Visit Number  19    Number of Visits  20    Date for PT Re-Evaluation  11/14/17    PT Start Time  3329    PT Stop Time  1511    PT Time Calculation (min)  54 min    Activity Tolerance  Patient tolerated treatment well    Behavior During Therapy  [facility] for tasks assessed/performed       Past Medical History:  Diagnosis Date  . Elevated PSA   . Prostate cancer ([place])   . Rotator cuff disorder, right     Past Surgical History:  Procedure Laterality Date  . PROSTATE BIOPSY    . PROSTATE BIOPSY    . SHOULDER ARTHROSCOPY WITH ROTATOR CUFF REPAIR AND SUBACROMIAL DECOMPRESSION Right 08/05/2017   Procedure: RIGHT SHOULDER ARTHROSCOPY WITH ROTATOR CUFF REPAIR, DISTAL CLAVICLE EXCISION, DEBRIDEMENT AND SUBACROMIAL DECOMPRESSION;  Surgeon: Leandrew Koyanagi, MD;  Location: [facility];  Service: Orthopedics;  Laterality: Right;    There were no vitals filed for this visit.  Subjective Assessment - 11/09/17 1800    Subjective  Shoulder is a little sore.  I want to work on motion.  I have been trying the exercises    Patient is accompained by:  Interpreter Phone interperter    Currently in Pain?  Yes    Pain Score  2     Pain Location  Shoulder    Pain Orientation  Right    Pain Descriptors / Indicators  Aching;Sore    Pain Type  Surgical pain    Pain Frequency  Constant    Aggravating Factors   end range stretching,  rolling onto    Pain Relieving Factors  position change                      [facility] Adult PT Treatment/Exercise - 11/09/17 0001      Self-Care   Self-Care  Other Self-Care Comments    Other Self-Care Comments   use of heat and or ice helpful with pain.  heat prior may assist comfort with stretching,  cold helpful with pain post stretching.      Shoulder Exercises: Stretch   Internal Rotation Stretch Limitations  sleeper stretch cues needed for arm position to 90 prior to stretch    Other Shoulder Stretches  3 x 30 with manual     Other Shoulder Stretches  self mobs with roll under aam grasp hand and pull down and across body 5 Reps 5-10 second hold.  painful concurrent with myofascial mobs anterior shoulder      Modalities   Modalities  Cryotherapy      Cryotherapy   Number Minutes Cryotherapy  10 Minutes    Cryotherapy Location  Shoulder    Type of Cryotherapy  -- cold pack      Manual Therapy   Manual Therapy  Joint mobilization;Passive ROM a lot of manual concurrent with moist heat placed different     Manual therapy comments  PROM into flexion/ ER/ IR  pec and teres areas tight and sore    Joint Mobilization  PA glides anterior, laterial,  ind posterior    Passive ROM  all ranges             PT Education - 11/09/17 1808    Education provided  Yes    Education Details  use of heat / ice for pain and stretching    Person(s) Educated  Patient    Methods  Explanation;Verbal cues    Comprehension  Verbalized understanding       PT Short Term Goals - 11/03/17 1513      PT SHORT TERM GOAL #1   Title  Patient will increase passive right ER to 30 degrees     Baseline  40    Time  3    Period  Weeks    Status  Achieved      PT SHORT TERM GOAL #2   Title  Patient will increase rightpassive shoulder flexion to 90 degrees     Baseline  140    Time  3    Period  Weeks    Status  Achieved      PT SHORT TERM GOAL #3   Title  Patient will be independent with initial HEP     Baseline  perfroming independently     Time  3    Period  Weeks    Status  Achieved        PT Long Term Goals - 11/09/17 1812      PT LONG TERM GOAL #1   Title  Patient will  demonstrate full left shoulder ER in order to reach behind his head     Baseline  able to reach behind back,  full ER not yet    Time  12    Period  Weeks    Status  Partially Met      PT LONG TERM GOAL #2   Title  Patient will reach behind his back with the right arm to L1 without pain in order to perform ADL's     Baseline  can reach L2 post session,  can he retain motion?    Time  12    Period  Weeks    Status  Partially Met      PT LONG TERM GOAL #3   Title  Patient will reach overhad into a cabinet with a 2lb weight in order to perfrom ADL's     Time  12    Period  Weeks    Status  Unable to assess      PT LONG TERM GOAL #4   Title  Patient will be independent with program that will progress him towards return to tennis     Time  12    Period  Weeks    Status  Unable to assess            Plan - 11/09/17 1809    Clinical Impression Statement  able to reach  T1 and mid line L2 post session.  LTG#1, #2  partially met.  he can reach behind his head, he does not yet have full shoulder ER    PT Next Visit Plan  continue with strengthening; progress supine flexion exercises; consider supine protraction; continue RTC strengthening. Stretch manual mobs    PT Home Exercise Plan  pendulums; wrist flexion/ ext; towel squeeze; scap squeeze, table slides, clasped hand flexion, ER with cane , (red band row, extension, ER, IR )    Consulted and Agree with Plan of Care  Patient  Patient will benefit from skilled therapeutic intervention in order to improve the following deficits and impairments:     Visit Diagnosis: Stiffness of right shoulder, not elsewhere classified  Acute pain of right shoulder  Muscle weakness (generalized)  Localized edema     Problem List Patient Active Problem List   Diagnosis Date Noted  . Malignant neoplasm of prostate ([place]) 10/13/2017  . Rotator cuff tear arthropathy of right shoulder 08/31/2017  . Follow up 08/31/2017  . Personal  history of gastric ulcer 02/09/2017    Avory Rahimi  PTA 11/09/2017, 6:15 PM  [facility] [address] [place], [place], [address] Phone: [phone]   Fax:  [phone]  Name: Roy Reed MRN: [id number] Date of Birth: 1955/09/26

## 2017-11-10 ENCOUNTER — Ambulatory Visit: Payer: Self-pay | Admitting: Physical Therapy

## 2017-11-10 ENCOUNTER — Encounter: Payer: Self-pay | Admitting: Physical Therapy

## 2017-11-10 ENCOUNTER — Telehealth: Payer: Self-pay | Admitting: *Deleted

## 2017-11-10 DIAGNOSIS — M6281 Muscle weakness (generalized): Secondary | ICD-10-CM

## 2017-11-10 DIAGNOSIS — M25511 Pain in right shoulder: Secondary | ICD-10-CM

## 2017-11-10 DIAGNOSIS — R600 Localized edema: Secondary | ICD-10-CM

## 2017-11-10 DIAGNOSIS — M25611 Stiffness of right shoulder, not elsewhere classified: Secondary | ICD-10-CM

## 2017-11-10 NOTE — Telephone Encounter (Signed)
Called patient to remind of pre-seed appts. for 11-11-17, spoke with patient's daughter Judson Roch and she is aware of these appts.

## 2017-11-11 ENCOUNTER — Ambulatory Visit
Admission: RE | Admit: 2017-11-11 | Discharge: 2017-11-11 | Disposition: A | Discharge status: Home / Self Care | Payer: Self-pay | Source: Ambulatory Visit | Attending: Radiation Oncology | Admitting: Radiation Oncology

## 2017-11-11 ENCOUNTER — Other Ambulatory Visit: Payer: Self-pay

## 2017-11-11 ENCOUNTER — Encounter (HOSPITAL_COMMUNITY)
Admission: RE | Admit: 2017-11-11 | Discharge: 2017-11-11 | Disposition: A | Discharge status: Home / Self Care | Payer: Self-pay | Source: Ambulatory Visit | Attending: Urology | Admitting: Urology

## 2017-11-11 ENCOUNTER — Ambulatory Visit (HOSPITAL_COMMUNITY)
Admission: RE | Admit: 2017-11-11 | Discharge: 2017-11-11 | Disposition: A | Discharge status: Home / Self Care | Payer: Self-pay | Source: Ambulatory Visit | Attending: Urology | Admitting: Urology

## 2017-11-11 ENCOUNTER — Encounter: Payer: Self-pay | Admitting: Physical Therapy

## 2017-11-11 DIAGNOSIS — Z01810 Encounter for preprocedural cardiovascular examination: Secondary | ICD-10-CM | POA: Insufficient documentation

## 2017-11-11 DIAGNOSIS — C61 Malignant neoplasm of prostate: Secondary | ICD-10-CM | POA: Insufficient documentation

## 2017-11-11 DIAGNOSIS — Z01818 Encounter for other preprocedural examination: Secondary | ICD-10-CM

## 2017-11-11 NOTE — Therapy (Signed)
[place], [place], [address] Phone: [phone]   Fax:  [phone]  Physical Therapy Treatment  Patient Details  Name: Roy Reed MRN: [id number] Date of Birth: 05-21-1956 Referring Provider: Dr Frankey Shown    Encounter Date: 11/10/2017  PT End of Session - 11/10/17 1509    Visit Number  20    Number of Visits  20    Date for PT Re-Evaluation  11/14/17    Authorization Type  CAFA     PT Start Time  0300    PT Stop Time  0342    PT Time Calculation (min)  42 min    Activity Tolerance  Patient tolerated treatment well    Behavior During Therapy  [facility] for tasks assessed/performed       Past Medical History:  Diagnosis Date  . Elevated PSA   . Prostate cancer ([place])   . Rotator cuff disorder, right     Past Surgical History:  Procedure Laterality Date  . PROSTATE BIOPSY    . PROSTATE BIOPSY    . SHOULDER ARTHROSCOPY WITH ROTATOR CUFF REPAIR AND SUBACROMIAL DECOMPRESSION Right 08/05/2017   Procedure: RIGHT SHOULDER ARTHROSCOPY WITH ROTATOR CUFF REPAIR, DISTAL CLAVICLE EXCISION, DEBRIDEMENT AND SUBACROMIAL DECOMPRESSION;  Surgeon: Leandrew Koyanagi, MD;  Location: [facility];  Service: Orthopedics;  Laterality: Right;    There were no vitals filed for this visit.  Subjective Assessment - 11/10/17 1723    Subjective  Patient continues to report his shoulder is a little sore after the other day. he has been working on his exercises at home.     Limitations  Lifting;Writing;House hold activities    Patient Stated Goals  to return to tennis     Currently in Pain?  Yes    Pain Score  2     Pain Location  Shoulder    Pain Orientation  Right    Pain Descriptors / Indicators  Aching;Sore    Pain Type  Surgical pain    Pain Onset  1 to 4 weeks ago    Pain Frequency  Constant    Aggravating Factors   end range stretching, rolling     Pain Relieving Factors  psotion change     Effect of  Pain on Daily Activities  difficulty perfroming tasks          [facility] PT Assessment - 11/11/17 0001      AROM   Right Shoulder Flexion  134 Degrees      PROM   Right Shoulder Flexion  140 Degrees    Right Shoulder ABduction  120 Degrees    Right Shoulder External Rotation  55 Degrees      Strength   Right Shoulder Flexion  4+/5    Right Shoulder Internal Rotation  4+/5    Right Shoulder External Rotation  4+/5                          PT Education - 11/10/17 1724    Education provided  Yes    Education Details  use of heat/ ice for pain stretching     Person(s) Educated  Patient    Methods  Explanation;Demonstration;Tactile cues;Verbal cues    Comprehension  Verbalized understanding;Returned demonstration;Verbal cues required;Tactile cues required       PT Short Term Goals - 11/11/17 1157      PT SHORT TERM GOAL #1   Title  Patient will increase passive right ER to 30 degrees     Baseline  40    Time  3    Period  Weeks    Status  Achieved      PT SHORT TERM GOAL #2   Title  Patient will increase rightpassive shoulder flexion to 90 degrees     Baseline  140    Time  3    Period  Weeks    Status  Achieved      PT SHORT TERM GOAL #3   Title  Patient will be independent with initial HEP     Baseline  perfroming independently     Time  3    Period  Weeks    Status  Achieved      PT SHORT TERM GOAL #4   Title  Patient will demostrate 3/5 right shoulder flexion strength below 90 degrees     Time  4    Period  Weeks    Status  Achieved        PT Long Term Goals - 11/11/17 1157      PT LONG TERM GOAL #1   Title  Patient will demonstrate full left shoulder ER in order to reach behind his head     Baseline  able to reach behind back,  full ER not yet    Time  12    Period  Weeks    Status  On-going      PT LONG TERM GOAL #2   Title  Patient will reach behind his back with the right arm to L1 without pain in order to perform ADL's      Baseline  can reach L2 post session,  can he retain motion?    Time  12    Period  Weeks    Status  On-going      PT LONG TERM GOAL #3   Title  Patient will reach overhad into a cabinet with a 2lb weight in order to perfrom ADL's     Time  12    Period  Weeks    Status  On-going      PT LONG TERM GOAL #4   Title  Patient will be independent with program that will progress him towards return to tennis     Time  12    Period  Weeks    Status  On-going            Plan - 11/11/17 1154    Clinical Impression Statement  The patient is making slow progress with his functional motion. His passive ER is not progressing as we would hope with more agressive stretching exercises. He would benefit from skilled therapy 2x a week for 4 more weeks.     History and Personal Factors relevant to plan of care:  lumbar surgery     Clinical Presentation  Evolving    Rehab Potential  Good    PT Frequency  2x / week    PT Duration  8 weeks    PT Treatment/Interventions  ADLs/Self Care Home Management;Cryotherapy;Electrical Stimulation;Iontophoresis 4mg /ml Dexamethasone;Gait training;Stair training;Therapeutic activities;Therapeutic exercise;Neuromuscular re-education;Passive range of motion    PT Next Visit Plan  continue with strengthening; progress supine flexion exercises; consider supine protraction; continue RTC strengthening. Stretch manual mobs    PT Home Exercise Plan  pendulums; wrist flexion/ ext; towel squeeze; scap squeeze, table slides, clasped hand flexion, ER with cane , (red band row, extension, ER, IR )  Consulted and Agree with Plan of Care  Patient       Patient will benefit from skilled therapeutic intervention in order to improve the following deficits and impairments:     Visit Diagnosis: Stiffness of right shoulder, not elsewhere classified  Acute pain of right shoulder  Muscle weakness (generalized)  Localized edema     Problem List Patient Active Problem List    Diagnosis Date Noted  . Malignant neoplasm of prostate ([place]) 10/13/2017  . Rotator cuff tear arthropathy of right shoulder 08/31/2017  . Follow up 08/31/2017  . Personal history of gastric ulcer 02/09/2017    Carney Living PT DPT  11/11/2017, 12:01 PM  [facility] [address] [place], [place], [address] Phone: [phone]   Fax:  [phone]  Name: Roy Reed MRN: [id number] Date of Birth: 1956/05/07

## 2017-11-11 NOTE — Progress Notes (Signed)
  Radiation Oncology         [phone] ________________________________  Name: Donaldson Richter MRN: [id number]  Date: 11/11/2017  DOB: 01-19-1956  SIMULATION AND TREATMENT PLANNING NOTE PUBIC ARCH STUDY  QJ:JHERDEY, [name], MD  Alfonse Spruce, F*  DIAGNOSIS: 62 y.o. 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    ICD-10-CM   1. Malignant neoplasm of prostate ([place]) C61     COMPLEX SIMULATION:  The patient presented today for evaluation for possible prostate seed implant. He was brought to the radiation planning suite and placed supine on the CT couch. A 3-dimensional image study set was obtained in upload to the planning computer. There, on each axial slice, I contoured the prostate gland. Then, using three-dimensional radiation planning tools I reconstructed the prostate in view of the structures from the transperineal needle pathway to assess for possible pubic arch interference. In doing so, I did not appreciate any pubic arch interference. Also, the patient's prostate volume was estimated based on the drawn structure. The volume was 26 cc.  Given the pubic arch appearance and prostate volume, patient remains a good candidate to proceed with prostate seed implant. Today, he freely provided informed written consent to proceed.    PLAN: The patient will undergo prostate seed implant.   ________________________________  Sheral Apley. Tammi Klippel, M.D.    This document serves as a record of services personally performed by Tyler Pita MD. It was created on his behalf by Delton Coombes, a trained medical scribe. The creation of this record is based on the scribe's personal observations and the provider's statements to them.

## 2017-11-15 ENCOUNTER — Ambulatory Visit: Payer: Self-pay | Admitting: Physical Therapy

## 2017-11-15 ENCOUNTER — Other Ambulatory Visit: Payer: Self-pay | Admitting: Urology

## 2017-11-15 DIAGNOSIS — R600 Localized edema: Secondary | ICD-10-CM

## 2017-11-15 DIAGNOSIS — M25511 Pain in right shoulder: Secondary | ICD-10-CM

## 2017-11-15 DIAGNOSIS — M25611 Stiffness of right shoulder, not elsewhere classified: Secondary | ICD-10-CM

## 2017-11-15 DIAGNOSIS — M6281 Muscle weakness (generalized): Secondary | ICD-10-CM

## 2017-11-15 DIAGNOSIS — C61 Malignant neoplasm of prostate: Secondary | ICD-10-CM

## 2017-11-16 NOTE — Therapy (Signed)
[place], [place], [address] Phone: [phone]   Fax:  [phone]  Physical Therapy Treatment  Patient Details  Name: Roy Reed MRN: [id number] Date of Birth: September 10, 1955 Referring Provider: Dr Frankey Shown    Encounter Date: 11/15/2017  PT End of Session - 11/16/17 0842    Visit Number  21    Number of Visits  28    Date for PT Re-Evaluation  12/14/17    Authorization Type  CAFA     PT Start Time  1415    PT Stop Time  1456    PT Time Calculation (min)  41 min    Activity Tolerance  Patient tolerated treatment well    Behavior During Therapy  [facility] for tasks assessed/performed       Past Medical History:  Diagnosis Date  . Elevated PSA   . Prostate cancer ([place])   . Rotator cuff disorder, right     Past Surgical History:  Procedure Laterality Date  . PROSTATE BIOPSY    . PROSTATE BIOPSY    . SHOULDER ARTHROSCOPY WITH ROTATOR CUFF REPAIR AND SUBACROMIAL DECOMPRESSION Right 08/05/2017   Procedure: RIGHT SHOULDER ARTHROSCOPY WITH ROTATOR CUFF REPAIR, DISTAL CLAVICLE EXCISION, DEBRIDEMENT AND SUBACROMIAL DECOMPRESSION;  Surgeon: Leandrew Koyanagi, MD;  Location: [place];  Service: Orthopedics;  Laterality: Right;    There were no vitals filed for this visit.  Subjective Assessment - 11/16/17 2144    Subjective  Patient reports no significant soreness after the last treatment. No pain today.     Patient Stated Goals  to return to tennis     Currently in Pain?  No/denies         [facility] PT Assessment - 11/16/17 0001      AROM   Right Shoulder Flexion  134 Degrees      PROM   Right Shoulder Flexion  140 Degrees      Strength   Right Shoulder Flexion  4+/5    Right Shoulder Internal Rotation  4+/5    Right Shoulder External Rotation  4+/5                  [facility] Adult PT Treatment/Exercise - 11/16/17 0001      Manual Therapy   Manual Therapy  Joint  mobilization;Passive ROM a lot of manual concurrent with moist heat placed different     Manual therapy comments  PROM into flexion/ ER/ IR  pec and teres areas tight and sore    Joint Mobilization  PA glides anterior, laterial, ind posterior; SHRC into ER; Mulligan mobilization for IR;     Passive ROM  all ranges             PT Education - 11/16/17 2145    Education provided  Yes    Education Details  reviewed HEP     Person(s) Educated  Patient    Methods  Explanation;Demonstration;Tactile cues;Verbal cues    Comprehension  Verbalized understanding;Returned demonstration;Verbal cues required;Tactile cues required       PT Short Term Goals - 11/16/17 0857      PT SHORT TERM GOAL #1   Title  Patient will increase passive right ER to 30 degrees     Baseline  40    Time  3    Period  Weeks    Status  Achieved      PT SHORT TERM GOAL #2   Title  Patient will increase  rightpassive shoulder flexion to 90 degrees     Baseline  140    Time  3    Period  Weeks    Status  Achieved      PT SHORT TERM GOAL #3   Title  Patient will be independent with initial HEP     Baseline  perfroming independently     Time  3    Period  Weeks    Status  Achieved      PT SHORT TERM GOAL #4   Title  Patient will demostrate 3/5 right shoulder flexion strength below 90 degrees     Baseline  4/5 to 120 dgrees     Time  4    Period  Weeks    Status  Achieved        PT Long Term Goals - 11/16/17 0859      PT LONG TERM GOAL #1   Title  Patient will demonstrate full left shoulder ER in order to reach behind his head     Baseline  still has to move his headforward but inmproving     Time  12    Period  Weeks    Status  Partially Met      PT LONG TERM GOAL #2   Title  Patient will reach behind his back with the right arm to L1 without pain in order to perform ADL's     Baseline  L4 with pain     Time  12    Period  Weeks    Status  On-going      PT LONG TERM GOAL #3   Title   Patient will reach overhad into a cabinet with a 2lb weight in order to perfrom ADL's     Baseline  can reach a 2lb weight to a cabinet     Time  12    Period  Weeks    Status  Achieved      PT LONG TERM GOAL #4   Title  Patient will be independent with program that will progress him towards return to tennis     Baseline  still a ways aeway form sport specific progress.     Time  12    Period  Weeks    Status  On-going            Plan - 11/16/17 8938    Clinical Impression Statement  Patients  his ER and IR are improving. He was able to get his second finger over his belt line. His active shoulder felxion is iimproving. Therapy will continue 2-3 more weeks to focus on agressive stretching. He is working on his exercises at home. He was encouraged to continue with overhead reaching strengthening. See below for goal specific progress.     Clinical Presentation  Stable    Clinical Decision Making  Low    Rehab Potential  Good    PT Frequency  2x / week    PT Duration  8 weeks    PT Treatment/Interventions  ADLs/Self Care Home Management;Cryotherapy;Electrical Stimulation;Iontophoresis 80m/ml Dexamethasone;Gait training;Stair training;Therapeutic activities;Therapeutic exercise;Neuromuscular re-education;Passive range of motion    PT Next Visit Plan  continue with strengthening; progress supine flexion exercises; consider supine protraction; continue RTC strengthening. Stretch manual mobs    PT Home Exercise Plan  pendulums; wrist flexion/ ext; towel squeeze; scap squeeze, table slides, clasped hand flexion, ER with cane , (red band row, extension, ER, IR )    Consulted and Agree  with Plan of Care  Patient       Patient will benefit from skilled therapeutic intervention in order to improve the following deficits and impairments:  Pain, Impaired UE functional use, Decreased safety awareness, Difficulty walking  Visit Diagnosis: Stiffness of right shoulder, not elsewhere classified -  Plan: PT plan of care cert/re-cert  Acute pain of right shoulder - Plan: PT plan of care cert/re-cert  Muscle weakness (generalized) - Plan: PT plan of care cert/re-cert  Localized edema - Plan: PT plan of care cert/re-cert     Problem List Patient Active Problem List   Diagnosis Date Noted  . Malignant neoplasm of prostate ([place]) 10/13/2017  . Rotator cuff tear arthropathy of right shoulder 08/31/2017  . Follow up 08/31/2017  . Personal history of gastric ulcer 02/09/2017    Carney Living 11/16/2017, 9:53 PM  [facility] [address] [place], [place], [address] Phone: [phone]   Fax:  [phone]  Name: Roy Reed MRN: [id number] Date of Birth: Jan 25, 1956

## 2017-11-17 ENCOUNTER — Ambulatory Visit: Payer: Self-pay | Admitting: Physical Therapy

## 2017-11-17 ENCOUNTER — Encounter: Payer: Self-pay | Admitting: Physical Therapy

## 2017-11-17 DIAGNOSIS — R600 Localized edema: Secondary | ICD-10-CM

## 2017-11-17 DIAGNOSIS — M25611 Stiffness of right shoulder, not elsewhere classified: Secondary | ICD-10-CM

## 2017-11-17 DIAGNOSIS — M6281 Muscle weakness (generalized): Secondary | ICD-10-CM

## 2017-11-17 DIAGNOSIS — M25511 Pain in right shoulder: Secondary | ICD-10-CM

## 2017-11-17 NOTE — Therapy (Signed)
[place], [place], [address] Phone: [phone]   Fax:  [phone]  Physical Therapy Treatment  Patient Details  Name: Roy Reed MRN: [id number] Date of Birth: 12/13/1955 Referring Provider: Dr Frankey Shown    Encounter Date: 11/17/2017  PT End of Session - 11/17/17 1506    Visit Number  22    Number of Visits  28    Date for PT Re-Evaluation  12/14/17    Authorization Type  CAFA     PT Start Time  0300    PT Stop Time  0343    PT Time Calculation (min)  43 min    Activity Tolerance  Patient tolerated treatment well    Behavior During Therapy  [facility] for tasks assessed/performed       Past Medical History:  Diagnosis Date  . Elevated PSA   . Prostate cancer ([place])   . Rotator cuff disorder, right     Past Surgical History:  Procedure Laterality Date  . PROSTATE BIOPSY    . PROSTATE BIOPSY    . SHOULDER ARTHROSCOPY WITH ROTATOR CUFF REPAIR AND SUBACROMIAL DECOMPRESSION Right 08/05/2017   Procedure: RIGHT SHOULDER ARTHROSCOPY WITH ROTATOR CUFF REPAIR, DISTAL CLAVICLE EXCISION, DEBRIDEMENT AND SUBACROMIAL DECOMPRESSION;  Surgeon: Leandrew Koyanagi, MD;  Location: [place];  Service: Orthopedics;  Laterality: Right;    There were no vitals filed for this visit.  Subjective Assessment - 11/17/17 1505    Subjective  Patient continues to report no pain. He has been working on his stretches.     Patient is accompained by:  Interpreter    Limitations  Lifting;Writing;House hold activities    Patient Stated Goals  to return to tennis     Currently in Pain?  No/denies         [facility] PT Assessment - 11/17/17 0001      PROM   Right Shoulder External Rotation  55 Degrees                  [facility] Adult PT Treatment/Exercise - 11/17/17 0001      Shoulder Exercises: Sidelying   External Rotation Limitations  2x10 3 lb       Shoulder Exercises: Standing   Other Standing  Exercises  shelf reach 3lb weigh 2x10 3rd shelf 1 lb 1x10       Manual Therapy   Manual Therapy  Joint mobilization;Passive ROM a lot of manual concurrent with moist heat placed different     Manual therapy comments  PROM into flexion/ ER/ IR with mosit heat  pec and teres areas tight and sore    Joint Mobilization  PA glides anterior, laterial, ind posterior; SHRC into ER; Mulligan mobilization for IR;     Passive ROM  all ranges             PT Education - 11/17/17 1505    Education provided  Yes    Education Details  reviewed stretches and exercises     Person(s) Educated  Patient    Methods  Explanation;Demonstration;Tactile cues;Verbal cues    Comprehension  Verbalized understanding;Returned demonstration;Verbal cues required;Tactile cues required       PT Short Term Goals - 11/16/17 0857      PT SHORT TERM GOAL #1   Title  Patient will increase passive right ER to 30 degrees     Baseline  40    Time  3    Period  Weeks  Status  Achieved      PT SHORT TERM GOAL #2   Title  Patient will increase rightpassive shoulder flexion to 90 degrees     Baseline  140    Time  3    Period  Weeks    Status  Achieved      PT SHORT TERM GOAL #3   Title  Patient will be independent with initial HEP     Baseline  perfroming independently     Time  3    Period  Weeks    Status  Achieved      PT SHORT TERM GOAL #4   Title  Patient will demostrate 3/5 right shoulder flexion strength below 90 degrees     Baseline  4/5 to 120 dgrees     Time  4    Period  Weeks    Status  Achieved        PT Long Term Goals - 11/16/17 0859      PT LONG TERM GOAL #1   Title  Patient will demonstrate full left shoulder ER in order to reach behind his head     Baseline  still has to move his headforward but inmproving     Time  12    Period  Weeks    Status  Partially Met      PT LONG TERM GOAL #2   Title  Patient will reach behind his back with the right arm to L1 without pain in order  to perform ADL's     Baseline  L4 with pain     Time  12    Period  Weeks    Status  On-going      PT LONG TERM GOAL #3   Title  Patient will reach overhad into a cabinet with a 2lb weight in order to perfrom ADL's     Baseline  can reach a 2lb weight to a cabinet     Time  12    Period  Weeks    Status  Achieved      PT LONG TERM GOAL #4   Title  Patient will be independent with program that will progress him towards return to tennis     Baseline  still a ways aeway form sport specific progress.     Time  12    Period  Weeks    Status  On-going            Plan - 11/17/17 1545    Clinical Impression Statement  Patients flexion and active IR are improving. His ER was measured about the same as the last time. He was encouraged to continue his star gazer stretch and his wand ER at home. Therapy educated patients wife on stretching.     Clinical Presentation  Stable    Clinical Decision Making  Low    Rehab Potential  Good    PT Frequency  2x / week    PT Duration  8 weeks    PT Treatment/Interventions  ADLs/Self Care Home Management;Cryotherapy;Electrical Stimulation;Iontophoresis 42m/ml Dexamethasone;Gait training;Stair training;Therapeutic activities;Therapeutic exercise;Neuromuscular re-education;Passive range of motion    PT Next Visit Plan  continue with strengthening; progress supine flexion exercises; consider supine protraction; continue RTC strengthening. Stretch manual mobs    PT Home Exercise Plan  pendulums; wrist flexion/ ext; towel squeeze; scap squeeze, table slides, clasped hand flexion, ER with cane , (red band row, extension, ER, IR )    Consulted and Agree with  Plan of Care  Patient       Patient will benefit from skilled therapeutic intervention in order to improve the following deficits and impairments:  Pain, Impaired UE functional use, Decreased safety awareness, Difficulty walking  Visit Diagnosis: Stiffness of right shoulder, not elsewhere  classified  Acute pain of right shoulder  Localized edema  Muscle weakness (generalized)     Problem List Patient Active Problem List   Diagnosis Date Noted  . Malignant neoplasm of prostate ([place]) 10/13/2017  . Rotator cuff tear arthropathy of right shoulder 08/31/2017  . Follow up 08/31/2017  . Personal history of gastric ulcer 02/09/2017    Carney Living PT DPT  11/17/2017, 4:37 PM  [facility] [address] [place], [place], [address] Phone: [phone]   Fax:  [phone]  Name: Roy Reed MRN: [id number] Date of Birth: 1956-03-28

## 2017-11-22 ENCOUNTER — Ambulatory Visit: Payer: Self-pay | Admitting: Physical Therapy

## 2017-11-22 DIAGNOSIS — M25611 Stiffness of right shoulder, not elsewhere classified: Secondary | ICD-10-CM

## 2017-11-22 DIAGNOSIS — R600 Localized edema: Secondary | ICD-10-CM

## 2017-11-22 DIAGNOSIS — M6281 Muscle weakness (generalized): Secondary | ICD-10-CM

## 2017-11-22 DIAGNOSIS — M25511 Pain in right shoulder: Secondary | ICD-10-CM

## 2017-11-23 ENCOUNTER — Encounter: Payer: Self-pay | Admitting: Physical Therapy

## 2017-11-23 NOTE — Therapy (Signed)
[place], [place], [address] Phone: [phone]   Fax:  [phone]  Physical Therapy Treatment  Patient Details  Name: Roy Reed MRN: [id number] Date of Birth: 17-Feb-1956 Referring Provider: Dr Frankey Shown    Encounter Date: 11/22/2017  PT End of Session - 11/22/17 1423    Visit Number  23    Number of Visits  28    Date for PT Re-Evaluation  12/14/17    Authorization Type  CAFA     PT Start Time  1418    PT Stop Time  1510    PT Time Calculation (min)  52 min    Activity Tolerance  Patient tolerated treatment well    Behavior During Therapy  [facility] for tasks assessed/performed       Past Medical History:  Diagnosis Date  . Elevated PSA   . Prostate cancer ([place])   . Rotator cuff disorder, right     Past Surgical History:  Procedure Laterality Date  . PROSTATE BIOPSY    . PROSTATE BIOPSY    . SHOULDER ARTHROSCOPY WITH ROTATOR CUFF REPAIR AND SUBACROMIAL DECOMPRESSION Right 08/05/2017   Procedure: RIGHT SHOULDER ARTHROSCOPY WITH ROTATOR CUFF REPAIR, DISTAL CLAVICLE EXCISION, DEBRIDEMENT AND SUBACROMIAL DECOMPRESSION;  Surgeon: Leandrew Koyanagi, MD;  Location: [place];  Service: Orthopedics;  Laterality: Right;    There were no vitals filed for this visit.  Subjective Assessment - 11/22/17 1421    Subjective  Pt reports no pain coming into therapy. Pt reports improvement in reaching behind back. Pt reports little soreness after last visit.     Patient is accompained by:  Interpreter    Limitations  Lifting;Writing;House hold activities    Patient Stated Goals  to return to tennis     Currently in Pain?  No/denies                      [facility] Adult PT Treatment/Exercise - 11/23/17 0001      Shoulder Exercises: Supine   Other Supine Exercises  supine flexion; 3lbs; x10      Shoulder Exercises: Sidelying   External Rotation Limitations  2x10 3 lb     Flexion   --      Shoulder Exercises: Standing   Other Standing Exercises  shelf reach 3lb weigh 2x10 3rd shelf 1 lb 1x10       Manual Therapy   Manual Therapy  Joint mobilization;Passive ROM a lot of manual concurrent with moist heat placed different     Manual therapy comments  PROM into flexion/ ER/ IR with mosit heat     Joint Mobilization  PA glides anterior, laterial, ind posterior; SHRC into ER; Mulligan mobilization for IR;     Passive ROM  all ranges             PT Education - 11/23/17 1245    Education provided  Yes    Education Details  Exercise technique     Person(s) Educated  Patient    Methods  Explanation;Demonstration;Tactile cues;Verbal cues    Comprehension  Returned demonstration;Verbalized understanding       PT Short Term Goals - 11/16/17 0857      PT SHORT TERM GOAL #1   Title  Patient will increase passive right ER to 30 degrees     Baseline  40    Time  3    Period  Weeks    Status  Achieved  PT SHORT TERM GOAL #2   Title  Patient will increase rightpassive shoulder flexion to 90 degrees     Baseline  140    Time  3    Period  Weeks    Status  Achieved      PT SHORT TERM GOAL #3   Title  Patient will be independent with initial HEP     Baseline  perfroming independently     Time  3    Period  Weeks    Status  Achieved      PT SHORT TERM GOAL #4   Title  Patient will demostrate 3/5 right shoulder flexion strength below 90 degrees     Baseline  4/5 to 120 dgrees     Time  4    Period  Weeks    Status  Achieved        PT Long Term Goals - 11/16/17 0859      PT LONG TERM GOAL #1   Title  Patient will demonstrate full left shoulder ER in order to reach behind his head     Baseline  still has to move his headforward but inmproving     Time  12    Period  Weeks    Status  Partially Met      PT LONG TERM GOAL #2   Title  Patient will reach behind his back with the right arm to L1 without pain in order to perform ADL's     Baseline  L4  with pain     Time  12    Period  Weeks    Status  On-going      PT LONG TERM GOAL #3   Title  Patient will reach overhad into a cabinet with a 2lb weight in order to perfrom ADL's     Baseline  can reach a 2lb weight to a cabinet     Time  12    Period  Weeks    Status  Achieved      PT LONG TERM GOAL #4   Title  Patient will be independent with program that will progress him towards return to tennis     Baseline  still a ways aeway form sport specific progress.     Time  12    Period  Weeks    Status  On-going            Plan - 11/22/17 1453    Clinical Impression Statement  Patient is still limited in ER. Pt's PROM ER has improved. Therapy added 3lb weight to exercises. Patient continues to benefit from agressive stretching exercises and skilled manual therapy.     History and Personal Factors relevant to plan of care:  lumbar surgery    Clinical Presentation  Stable    Clinical Decision Making  Low    Rehab Potential  Good    PT Frequency  2x / week    PT Duration  8 weeks    PT Treatment/Interventions  ADLs/Self Care Home Management;Cryotherapy;Electrical Stimulation;Iontophoresis '4mg'$ /ml Dexamethasone;Gait training;Stair training;Therapeutic activities;Therapeutic exercise;Neuromuscular re-education;Passive range of motion    PT Next Visit Plan  continue with strengthening; progress supine flexion exercises; consider supine protraction; continue RTC strengthening. Stretch manual mobs    PT Home Exercise Plan  pendulums; wrist flexion/ ext; towel squeeze; scap squeeze, table slides, clasped hand flexion, ER with cane , (red band row, extension, ER, IR )    Consulted and Agree with Plan of Care  Patient       Patient will benefit from skilled therapeutic intervention in order to improve the following deficits and impairments:  Pain, Impaired UE functional use, Decreased safety awareness, Difficulty walking  Visit Diagnosis: Stiffness of right shoulder, not elsewhere  classified  Acute pain of right shoulder  Localized edema  Muscle weakness (generalized)   Problem List Patient Active Problem List   Diagnosis Date Noted  . Malignant neoplasm of prostate ([place]) 10/13/2017  . Rotator cuff tear arthropathy of right shoulder 08/31/2017  . Follow up 08/31/2017  . Personal history of gastric ulcer 02/09/2017   Cooper Render SPT  11/23/2017  Carney Living PT DPT  11/23/2017, 1:03 PM     [facility] [address] [place], [place], [address] Phone: [phone]   Fax:  [phone]  Name: Martha Soltys MRN: [id number] Date of Birth: 1956-07-06

## 2017-11-24 ENCOUNTER — Ambulatory Visit: Payer: Self-pay | Admitting: Physical Therapy

## 2017-11-24 DIAGNOSIS — R600 Localized edema: Secondary | ICD-10-CM

## 2017-11-24 DIAGNOSIS — M25611 Stiffness of right shoulder, not elsewhere classified: Secondary | ICD-10-CM

## 2017-11-24 DIAGNOSIS — M6281 Muscle weakness (generalized): Secondary | ICD-10-CM

## 2017-11-24 DIAGNOSIS — M25511 Pain in right shoulder: Secondary | ICD-10-CM

## 2017-11-25 ENCOUNTER — Encounter: Payer: Self-pay | Admitting: Physical Therapy

## 2017-11-25 NOTE — Therapy (Addendum)
[place], [place], [address] Phone: [phone]   Fax:  [phone]  Physical Therapy Treatment  Patient Details  Name: Roy Reed MRN: [id number] Date of Birth: 23-Feb-1956 Referring Provider: Dr Frankey Shown    Encounter Date: 11/24/2017  PT End of Session - 11/24/17 1550    Visit Number  24    Number of Visits  28    Date for PT Re-Evaluation  12/14/17    Authorization Type  CAFA     PT Start Time  1545    PT Stop Time  1630    PT Time Calculation (min)  45 min    Activity Tolerance  Patient tolerated treatment well    Behavior During Therapy  [facility] for tasks assessed/performed       Past Medical History:  Diagnosis Date  . Elevated PSA   . Prostate cancer ([place])   . Rotator cuff disorder, right     Past Surgical History:  Procedure Laterality Date  . PROSTATE BIOPSY    . PROSTATE BIOPSY    . SHOULDER ARTHROSCOPY WITH ROTATOR CUFF REPAIR AND SUBACROMIAL DECOMPRESSION Right 08/05/2017   Procedure: RIGHT SHOULDER ARTHROSCOPY WITH ROTATOR CUFF REPAIR, DISTAL CLAVICLE EXCISION, DEBRIDEMENT AND SUBACROMIAL DECOMPRESSION;  Surgeon: Leandrew Koyanagi, MD;  Location: [place];  Service: Orthopedics;  Laterality: Right;    There were no vitals filed for this visit.  Subjective Assessment - 11/24/17 1548    Subjective  Pt reports pain has remained the same at a 2/10. Pt reports no soreness after last therapy visit.     Patient is accompained by:  Interpreter    Limitations  Lifting;Writing;House hold activities    Pain Score  2     Pain Location  Shoulder    Pain Orientation  Right    Pain Descriptors / Indicators  Aching;Sore    Pain Type  Surgical pain    Pain Onset  1 to 4 weeks ago    Pain Frequency  Constant    Aggravating Factors   end range stretching, rolling     Pain Relieving Factors  position change     Effect of Pain on Daily Activities  difficulty performing tasks    Multiple Pain Sites  No         [facility] PT Assessment - 11/24/17 0001      PROM   Right Shoulder External Rotation  59 Degrees                  [facility] Adult PT Treatment/Exercise - 11/25/17 0001      Shoulder Exercises: Supine   Other Supine Exercises  supine flexion; 3lbs; x10      Shoulder Exercises: Sidelying   External Rotation Limitations  2x10 3 lb       Shoulder Exercises: Standing   Other Standing Exercises  shelf reach 3lb weigh 2x10 3rd shelf 1 lb 1x10       Manual Therapy   Manual Therapy  Joint mobilization;Passive ROM a lot of manual concurrent with moist heat placed different     Manual therapy comments  PROM into flexion/ ER/ IR with mosit heat     Joint Mobilization  PA glides anterior, laterial, ind posterior; [facility] into ER; Mulligan mobilization for IR;     Passive ROM  all ranges             PT Education - 11/25/17 0741    Education provided  Yes    Education Details  reviewed exercise techniques; POC     Person(s) Educated  Patient    Methods  Explanation;Demonstration;Tactile cues;Verbal cues    Comprehension  Verbalized understanding;Returned demonstration;Verbal cues required;Tactile cues required;Need further instruction       PT Short Term Goals - 11/16/17 0857      PT SHORT TERM GOAL #1   Title  Patient will increase passive right ER to 30 degrees     Baseline  40    Time  3    Period  Weeks    Status  Achieved      PT SHORT TERM GOAL #2   Title  Patient will increase rightpassive shoulder flexion to 90 degrees     Baseline  140    Time  3    Period  Weeks    Status  Achieved      PT SHORT TERM GOAL #3   Title  Patient will be independent with initial HEP     Baseline  perfroming independently     Time  3    Period  Weeks    Status  Achieved      PT SHORT TERM GOAL #4   Title  Patient will demostrate 3/5 right shoulder flexion strength below 90 degrees     Baseline  4/5 to 120 dgrees     Time  4    Period  Weeks     Status  Achieved        PT Long Term Goals - 11/25/17 0744      PT LONG TERM GOAL #1   Title  Patient will demonstrate full left shoulder ER in order to reach behind his head     Baseline  rechaning behind his head after stretching     Time  12    Period  Weeks    Status  On-going      PT LONG TERM GOAL #2   Title  Patient will reach behind his back with the right arm to L1 without pain in order to perform ADL's     Baseline  L4 with pain     Time  12    Period  Weeks    Status  On-going      PT LONG TERM GOAL #3   Title  Patient will reach overhad into a cabinet with a 2lb weight in order to perfrom ADL's     Baseline  can reach a 2lb weight to a cabinet     Time  12    Period  Weeks    Status  Achieved      PT LONG TERM GOAL #4   Title  Patient will be independent with program that will progress him towards return to tennis     Baseline  still a ways aeway form sport specific progress.     Time  12    Period  Weeks    Status  On-going            Plan - 11/25/17 0741    Clinical Impression Statement  Patients ER measured at 68 degrees after PROM. He was able to lift a 3lb weight to a shelf. He expressed an intrest in playing teniss agian. Add sports specific exercises next week.     Clinical Presentation  Stable    Clinical Decision Making  Low    Rehab Potential  Good    PT Frequency  2x / week  PT Duration  8 weeks    PT Treatment/Interventions  ADLs/Self Care Home Management;Cryotherapy;Electrical Stimulation;Iontophoresis 4mg /ml Dexamethasone;Gait training;Stair training;Therapeutic activities;Therapeutic exercise;Neuromuscular re-education;Passive range of motion    PT Next Visit Plan  continue with strengthening; progress supine flexion exercises; consider supine protraction; continue RTC strengthening. Stretch manual mobs    PT Home Exercise Plan  pendulums; wrist flexion/ ext; towel squeeze; scap squeeze, table slides, clasped hand flexion, ER with  cane , (red band row, extension, ER, IR )    Consulted and Agree with Plan of Care  Patient       Patient will benefit from skilled therapeutic intervention in order to improve the following deficits and impairments:  Pain, Impaired UE functional use, Decreased safety awareness, Difficulty walking  Visit Diagnosis: Stiffness of right shoulder, not elsewhere classified  Acute pain of right shoulder  Localized edema  Muscle weakness (generalized)     Problem List Patient Active Problem List   Diagnosis Date Noted  . Malignant neoplasm of prostate ([place]) 10/13/2017  . Rotator cuff tear arthropathy of right shoulder 08/31/2017  . Follow up 08/31/2017  . Personal history of gastric ulcer 02/09/2017    Carney Living PT DPT  11/25/2017, 7:50 AM  [facility] [address] [place], [place], [address] Phone: [phone]   Fax:  [phone]  Name: Ludger Bones MRN: [id number] Date of Birth: [birth year]

## 2017-11-29 ENCOUNTER — Ambulatory Visit: Payer: Self-pay | Admitting: Family Medicine

## 2017-11-29 ENCOUNTER — Encounter: Payer: Self-pay | Admitting: Physical Therapy

## 2017-11-29 ENCOUNTER — Ambulatory Visit: Payer: Self-pay | Admitting: Physical Therapy

## 2017-11-29 DIAGNOSIS — M25511 Pain in right shoulder: Secondary | ICD-10-CM

## 2017-11-29 DIAGNOSIS — M6281 Muscle weakness (generalized): Secondary | ICD-10-CM

## 2017-11-29 DIAGNOSIS — M25611 Stiffness of right shoulder, not elsewhere classified: Secondary | ICD-10-CM

## 2017-11-29 DIAGNOSIS — R600 Localized edema: Secondary | ICD-10-CM

## 2017-11-30 ENCOUNTER — Encounter: Payer: Self-pay | Admitting: Physical Therapy

## 2017-11-30 NOTE — Therapy (Signed)
[place], [place], [address] Phone: [phone]   Fax:  [phone]  Physical Therapy Treatment  Patient Details  Name: Roy Reed MRN: [id number] Date of Birth: Jan 31, 1956 Referring Provider: Dr Frankey Shown    Encounter Date: 11/29/2017  PT End of Session - 11/29/17 1441    Visit Number  25    Number of Visits  28    Date for PT Re-Evaluation  12/14/17    Authorization Type  CAFA     PT Start Time  1417    PT Stop Time  1500    PT Time Calculation (min)  43 min    Activity Tolerance  Patient tolerated treatment well    Behavior During Therapy  [facility] for tasks assessed/performed       Past Medical History:  Diagnosis Date  . Elevated PSA   . Prostate cancer ([place])   . Rotator cuff disorder, right     Past Surgical History:  Procedure Laterality Date  . PROSTATE BIOPSY    . PROSTATE BIOPSY    . SHOULDER ARTHROSCOPY WITH ROTATOR CUFF REPAIR AND SUBACROMIAL DECOMPRESSION Right 08/05/2017   Procedure: RIGHT SHOULDER ARTHROSCOPY WITH ROTATOR CUFF REPAIR, DISTAL CLAVICLE EXCISION, DEBRIDEMENT AND SUBACROMIAL DECOMPRESSION;  Surgeon: Leandrew Koyanagi, MD;  Location: [place];  Service: Orthopedics;  Laterality: Right;    There were no vitals filed for this visit.  Subjective Assessment - 11/29/17 1423    Subjective  P reports that he is having some shoulder muscle sorenes today. Pt reports that using it this week has increased the pain.    Limitations  Lifting;Writing;House hold activities    Patient Stated Goals  to return to tennis     Currently in Pain?  Yes    Pain Score  2     Pain Location  Shoulder    Pain Orientation  Right    Pain Descriptors / Indicators  Aching;Sore    Pain Type  Surgical pain    Pain Radiating Towards  pain that can radiate into the bicpes muslce     Pain Onset  1 to 4 weeks ago    Pain Frequency  Constant    Aggravating Factors   end range  stretching and rolling     Pain Relieving Factors  position change     Effect of Pain on Daily Activities  difficulty perfroming tasks                 No data recorded       [facility] Adult PT Treatment/Exercise - 11/30/17 0001      Shoulder Exercises: Seated   Other Seated Exercises  seated ITR with towells 2x10; Seated IR with towells 2x10       Shoulder Exercises: Standing   Other Standing Exercises  standing doorway stretch 2x10     Other Standing Exercises  Standing back hand below the waist yllow 2x10; standing forehand below the waist 2x10;       Manual Therapy   Manual Therapy  Joint mobilization;Passive ROM a lot of manual concurrent with moist heat placed different     Manual therapy comments  PROM into flexion/ ER/ IR with mosit heat     Joint Mobilization  PA glides anterior, laterial, ind posterior; SHRC into ER; Mulligan mobilization for IR;     Passive ROM  all ranges             PT Education -  11/29/17 1441    Education provided  Yes    Education Details  reviewed technique with exercises.     Person(s) Educated  Patient    Methods  Explanation;Demonstration;Tactile cues;Verbal cues    Comprehension  Verbalized understanding;Returned demonstration;Verbal cues required;Tactile cues required       PT Short Term Goals - 11/16/17 0857      PT SHORT TERM GOAL #1   Title  Patient will increase passive right ER to 30 degrees     Baseline  40    Time  3    Period  Weeks    Status  Achieved      PT SHORT TERM GOAL #2   Title  Patient will increase rightpassive shoulder flexion to 90 degrees     Baseline  140    Time  3    Period  Weeks    Status  Achieved      PT SHORT TERM GOAL #3   Title  Patient will be independent with initial HEP     Baseline  perfroming independently     Time  3    Period  Weeks    Status  Achieved      PT SHORT TERM GOAL #4   Title  Patient will demostrate 3/5 right shoulder flexion strength below 90 degrees      Baseline  4/5 to 120 dgrees     Time  4    Period  Weeks    Status  Achieved        PT Long Term Goals - 11/25/17 0744      PT LONG TERM GOAL #1   Title  Patient will demonstrate full left shoulder ER in order to reach behind his head     Baseline  rechaning behind his head after stretching     Time  12    Period  Weeks    Status  On-going      PT LONG TERM GOAL #2   Title  Patient will reach behind his back with the right arm to L1 without pain in order to perform ADL's     Baseline  L4 with pain     Time  12    Period  Weeks    Status  On-going      PT LONG TERM GOAL #3   Title  Patient will reach overhad into a cabinet with a 2lb weight in order to perfrom ADL's     Baseline  can reach a 2lb weight to a cabinet     Time  12    Period  Weeks    Status  Achieved      PT LONG TERM GOAL #4   Title  Patient will be independent with program that will progress him towards return to tennis     Baseline  still a ways aeway form sport specific progress.     Time  12    Period  Weeks    Status  On-going            Plan - 11/29/17 1443    Clinical Impression Statement  Therpay focused on sport specific activity. He would like to retunr to tennis. He is limited in his motion but had no significnat pain. He was given an updated HEp with his exercises.     Clinical Presentation  Stable    Clinical Decision Making  Low    Rehab Potential  Good    PT Frequency  2x / week    PT Duration  8 weeks    PT Treatment/Interventions  ADLs/Self Care Home Management;Cryotherapy;Electrical Stimulation;Iontophoresis 4mg /ml Dexamethasone;Gait training;Stair training;Therapeutic activities;Therapeutic exercise;Neuromuscular re-education;Passive range of motion    PT Next Visit Plan  continue with strengthening; progress supine flexion exercises; consider supine protraction; continue RTC strengthening. Stretch manual mobs    PT Home Exercise Plan  pendulums; wrist flexion/ ext; towel squeeze;  scap squeeze, table slides, clasped hand flexion, ER with cane , (red band row, extension, ER, IR )    Consulted and Agree with Plan of Care  Patient       Patient will benefit from skilled therapeutic intervention in order to improve the following deficits and impairments:  Pain, Impaired UE functional use, Decreased safety awareness, Difficulty walking  Visit Diagnosis: Stiffness of right shoulder, not elsewhere classified  Localized edema  Acute pain of right shoulder  Muscle weakness (generalized)     Problem List Patient Active Problem List   Diagnosis Date Noted  . Malignant neoplasm of prostate ([place]) 10/13/2017  . Rotator cuff tear arthropathy of right shoulder 08/31/2017  . Follow up 08/31/2017  . Personal history of gastric ulcer 02/09/2017   Cooper Render PT DPT  11/30/2017   Carney Living PT DPT  11/30/2017, 12:23 PM  During this treatment session, the therapist was present, participating in and directing the treatment.   [place], [place], [address] Phone: [phone]   Fax:  [phone]  Name: Roy Reed MRN: [id number] Date of Birth: September 19, 1955

## 2017-12-01 ENCOUNTER — Encounter: Payer: Self-pay | Admitting: Physical Therapy

## 2017-12-01 ENCOUNTER — Ambulatory Visit: Payer: Self-pay | Admitting: Physical Therapy

## 2017-12-01 DIAGNOSIS — R600 Localized edema: Secondary | ICD-10-CM

## 2017-12-01 DIAGNOSIS — M25611 Stiffness of right shoulder, not elsewhere classified: Secondary | ICD-10-CM

## 2017-12-01 DIAGNOSIS — M25511 Pain in right shoulder: Secondary | ICD-10-CM

## 2017-12-01 DIAGNOSIS — M6281 Muscle weakness (generalized): Secondary | ICD-10-CM

## 2017-12-02 NOTE — Therapy (Signed)
[place], [place], [address] Phone: [phone]   Fax:  [phone]  Physical Therapy Treatment  Patient Details  Name: Roy Reed MRN: [id number] Date of Birth: August 11, 1956 Referring Provider: Dr Frankey Shown    Encounter Date: 12/01/2017  PT End of Session - 12/01/17 1558    Visit Number  26    Number of Visits  28    Date for PT Re-Evaluation  12/14/17    Authorization Type  CAFA     PT Start Time  0345    PT Stop Time  0441    PT Time Calculation (min)  56 min    Activity Tolerance  Patient tolerated treatment well    Behavior During Therapy  [facility] for tasks assessed/performed       Past Medical History:  Diagnosis Date  . Elevated PSA   . Prostate cancer ([place])   . Rotator cuff disorder, right     Past Surgical History:  Procedure Laterality Date  . PROSTATE BIOPSY    . PROSTATE BIOPSY    . SHOULDER ARTHROSCOPY WITH ROTATOR CUFF REPAIR AND SUBACROMIAL DECOMPRESSION Right 08/05/2017   Procedure: RIGHT SHOULDER ARTHROSCOPY WITH ROTATOR CUFF REPAIR, DISTAL CLAVICLE EXCISION, DEBRIDEMENT AND SUBACROMIAL DECOMPRESSION;  Surgeon: Leandrew Koyanagi, MD;  Location: [place];  Service: Orthopedics;  Laterality: Right;    There were no vitals filed for this visit.  Subjective Assessment - 12/01/17 1554    Subjective  Pt reports that he has no pain in his shoulder today. Pt reports that he had a little soreness after last visit. Pt states that he will continue to work on his exercises at home and hopes to return to playing tennis.     Patient is accompained by:  Interpreter    Limitations  Lifting;Writing;House hold activities    Patient Stated Goals  to return to tennis     Pain Score  0-No pain                No data recorded       [facility] Adult PT Treatment/Exercise - 12/02/17 0001      Self-Care   Self-Care  Other Self-Care Comments    Other Self-Care Comments    reviewed stretching and light mobilizations with the patients son. Patients son reported feeling confortabel with treatment.       Shoulder Exercises: Seated   Other Seated Exercises  seated ITR with towells 2x10; Seated IR with towells 2x10       Shoulder Exercises: Sidelying   External Rotation Limitations  2x10 3 lb       Shoulder Exercises: Standing   Other Standing Exercises  standing doorway stretch 2x10     Other Standing Exercises  Standing back hand below the waist yllow 2x10; standing forehand below the waist 2x10;       Manual Therapy   Manual Therapy  Joint mobilization;Passive ROM a lot of manual concurrent with moist heat placed different     Manual therapy comments  PROM into flexion/ ER/ IR with mosit heat     Joint Mobilization  PA glides anterior, laterial, ind posterior; SHRC into ER; Mulligan mobilization for IR;     Passive ROM  all ranges             PT Education - 12/01/17 1557    Education Details  Symptom management; Review home exercises to continue his progression of shoulder mobility and strength  Person(s) Educated  Patient;Other (comment)    Methods  Explanation;Demonstration;Tactile cues;Verbal cues    Comprehension  Verbalized understanding;Returned demonstration       PT Short Term Goals - 11/16/17 0857      PT SHORT TERM GOAL #1   Title  Patient will increase passive right ER to 30 degrees     Baseline  40    Time  3    Period  Weeks    Status  Achieved      PT SHORT TERM GOAL #2   Title  Patient will increase rightpassive shoulder flexion to 90 degrees     Baseline  140    Time  3    Period  Weeks    Status  Achieved      PT SHORT TERM GOAL #3   Title  Patient will be independent with initial HEP     Baseline  perfroming independently     Time  3    Period  Weeks    Status  Achieved      PT SHORT TERM GOAL #4   Title  Patient will demostrate 3/5 right shoulder flexion strength below 90 degrees     Baseline  4/5 to 120  dgrees     Time  4    Period  Weeks    Status  Achieved        PT Long Term Goals - 12/02/17 0813      PT LONG TERM GOAL #1   Title  Patient will demonstrate full left shoulder ER in order to reach behind his head     Baseline  rechaning behind his head after stretching     Time  12    Period  Weeks      PT LONG TERM GOAL #2   Title  Patient will reach behind his back with the right arm to L1 without pain in order to perform ADL's     Baseline  unable to reach past L4     Status  Not Met      PT LONG TERM GOAL #3   Title  Patient will reach overhad into a cabinet with a 2lb weight in order to perfrom ADL's     Baseline  can reach a 2lb weight to a cabinet     Time  12    Period  Weeks    Status  Achieved      PT LONG TERM GOAL #4   Title  Patient will be independent with program that will progress him towards return to tennis     Baseline  working on sprot specific training     Time  12    Period  Weeks    Status  Achieved            Plan - 12/01/17 1608    Clinical Impression Statement  Therapy reviewed exercises and stretches that the pt can continue to use in order to progress shoulder ROM and strength. Pt continues to show progress in all shoulder ranges. Pt feels comfortable to discharge and continue his exercise program on his own. Therapy also showed the aptients son hoow to stretch his shoulder.     Clinical Presentation  Stable    Clinical Decision Making  Low    Rehab Potential  Good    PT Frequency  2x / week    PT Duration  8 weeks    PT Treatment/Interventions  ADLs/Self Care Home Management;Cryotherapy;Electrical Stimulation;Iontophoresis 4m/ml  Dexamethasone;Gait training;Stair training;Therapeutic activities;Therapeutic exercise;Neuromuscular re-education;Passive range of motion    PT Next Visit Plan  continue with strengthening; progress supine flexion exercises; consider supine protraction; continue RTC strengthening. Stretch manual mobs    PT Home  Exercise Plan  pendulums; wrist flexion/ ext; towel squeeze; scap squeeze, table slides, clasped hand flexion, ER with cane , (red band row, extension, ER, IR )    Consulted and Agree with Plan of Care  Patient       Patient will benefit from skilled therapeutic intervention in order to improve the following deficits and impairments:  Pain, Impaired UE functional use, Decreased safety awareness, Difficulty walking  Visit Diagnosis: Stiffness of right shoulder, not elsewhere classified  Localized edema  Acute pain of right shoulder  Muscle weakness (generalized)  PHYSICAL THERAPY DISCHARGE SUMMARY  Visits from Start of Care:26  Current functional level related to goals / functional outcomes: Improved functional use of his arm. Patient can reach to a shelf and reach behind his head. He is having very littl pain    Remaining deficits: Limited end range IR and ER    Education / Equipment: Has home HEP Plan: Patient agrees to discharge.  Patient goals were partially met. Patient is being discharged due to meeting the stated rehab goals.  ?????       Problem List Patient Active Problem List   Diagnosis Date Noted  . Malignant neoplasm of prostate ([place]) 10/13/2017  . Rotator cuff tear arthropathy of right shoulder 08/31/2017  . Follow up 08/31/2017  . Personal history of gastric ulcer 02/09/2017    Carney Living PT DPT  12/02/2017, 8:14 AM  [facility] [address] [place], [place], [address] Phone: [phone]   Fax:  [phone]  Name: Roy Reed MRN: [id number] Date of Birth: August 26, 1956

## 2017-12-08 MED FILL — ?ATORVASTATIN 10 MG TABLET: 10 | 30 days supply | Qty: 30 | Fill #1

## 2017-12-12 ENCOUNTER — Encounter (INDEPENDENT_AMBULATORY_CARE_PROVIDER_SITE_OTHER): Payer: Self-pay | Admitting: Orthopaedic Surgery

## 2017-12-12 ENCOUNTER — Ambulatory Visit (INDEPENDENT_AMBULATORY_CARE_PROVIDER_SITE_OTHER): Payer: Self-pay | Admitting: Orthopaedic Surgery

## 2017-12-12 DIAGNOSIS — M7541 Impingement syndrome of right shoulder: Secondary | ICD-10-CM

## 2017-12-12 DIAGNOSIS — M75101 Unspecified rotator cuff tear or rupture of right shoulder, not specified as traumatic: Secondary | ICD-10-CM

## 2017-12-12 NOTE — Progress Notes (Signed)
Office Visit Note   Patient: Roy Reed           Date of Birth: 1955-12-22           MRN: [id number] Visit Date: 12/12/2017              Requested by: Alfonse Spruce, FNP No address on file PCP: Ladell Pier, MD   Assessment & Plan: Visit Diagnoses:  1. Rotator cuff syndrome, right   2. Impingement syndrome of right shoulder     Plan: Impression is 4 months status post shoulder arthroscopy and rotator cuff repair.  Overall I think he is progressing well.  I have encouraged him to continue with exercises.  I would expect him to continue to improve.  Recheck in 2 months.  Questions encouraged and answered.  Follow-Up Instructions: Return in about 2 months (around 02/11/2018).   Orders:  No orders of the defined types were placed in this encounter.  No orders of the defined types were placed in this encounter.     Procedures: No procedures performed   Clinical Data: No additional findings.   Subjective: Chief Complaint  Patient presents with  . Right Shoulder - Pain, Follow-up    Patient is 4 months status post rotator cuff repair and debridements.  He overall is doing well.  He has finished physical therapy.  He is doing home exercise.  He is not taking any pain medicines.  He says he has occasional pain in his right shoulder is active.  Overall he has improved from the surgery   Review of Systems  Constitutional: Negative.   All other systems reviewed and are negative.    Objective: Vital Signs: There were no vitals taken for this visit.  Physical Exam  Constitutional: He is oriented to person, place, and time. He appears well-developed and well-nourished.  Pulmonary/Chest: Effort normal.  Abdominal: Soft.  Neurological: He is alert and oriented to person, place, and time.  Skin: Skin is warm.  Psychiatric: He has a normal mood and affect. His behavior is normal. Judgment and thought content normal.  Nursing note and vitals  reviewed.   Ortho Exam Right shoulder exam shows grossly intact rotator cuff testing without any significant pain.  He has mild stiffness of his right shoulder range of motion. Specialty Comments:  No specialty comments available.  Imaging: No results found.   PMFS History: Patient Active Problem List   Diagnosis Date Noted  . Malignant neoplasm of prostate ([place]) 10/13/2017  . Rotator cuff tear arthropathy of right shoulder 08/31/2017  . Follow up 08/31/2017  . Personal history of gastric ulcer 02/09/2017   Past Medical History:  Diagnosis Date  . Elevated PSA   . Prostate cancer ([place])   . Rotator cuff disorder, right     History reviewed. No pertinent family history.  Past Surgical History:  Procedure Laterality Date  . PROSTATE BIOPSY    . PROSTATE BIOPSY    . SHOULDER ARTHROSCOPY WITH ROTATOR CUFF REPAIR AND SUBACROMIAL DECOMPRESSION Right 08/05/2017   Procedure: RIGHT SHOULDER ARTHROSCOPY WITH ROTATOR CUFF REPAIR, DISTAL CLAVICLE EXCISION, DEBRIDEMENT AND SUBACROMIAL DECOMPRESSION;  Surgeon: Leandrew Koyanagi, MD;  Location: [place];  Service: Orthopedics;  Laterality: Right;   Social History   Occupational History  . Not on file  Tobacco Use  . Smoking status: Never Smoker  . Smokeless tobacco: Never Used  Substance and Sexual Activity  . Alcohol use: Yes    Comment: social  .  Drug use: No  . Sexual activity: Not on file       

## 2017-12-15 ENCOUNTER — Other Ambulatory Visit: Payer: Self-pay | Admitting: Urology

## 2017-12-21 ENCOUNTER — Encounter (HOSPITAL_BASED_OUTPATIENT_CLINIC_OR_DEPARTMENT_OTHER): Payer: Self-pay

## 2017-12-21 ENCOUNTER — Other Ambulatory Visit: Payer: Self-pay

## 2017-12-21 NOTE — Progress Notes (Signed)
Patient instructed to use Fleet enema the morning of surgery

## 2017-12-21 NOTE — Progress Notes (Signed)
Spoke with:  Shermar NPO:  No food after midnight/Clear liquids until 8:30AM DOS Arrival time: 12:30PM Labs:  None AM medications:  Atorvastatin Pre op orders:  Yes Ride home:  Judson Roch (daughter) [phone] [place] Interpreter requested

## 2017-12-22 ENCOUNTER — Telehealth: Payer: Self-pay | Admitting: *Deleted

## 2017-12-22 NOTE — Telephone Encounter (Signed)
CALLED PATIENT'S DAUGHTER - SARAH TO REMIND OF LABS FOR 12-26-17- ARRIVAL TIME - 8:45 AM @ [facility] ADMITTING, LVM FOR A RETURN CALL

## 2017-12-26 ENCOUNTER — Encounter (HOSPITAL_COMMUNITY)
Admission: RE | Admit: 2017-12-26 | Discharge: 2017-12-26 | Disposition: A | Discharge status: Home / Self Care | Payer: Self-pay | Source: Ambulatory Visit | Attending: Urology | Admitting: Urology

## 2017-12-26 DIAGNOSIS — Z01812 Encounter for preprocedural laboratory examination: Secondary | ICD-10-CM | POA: Insufficient documentation

## 2017-12-26 LAB — APTT: APTT: 30 s (ref 24–36)

## 2017-12-26 LAB — COMPREHENSIVE METABOLIC PANEL
ALK PHOS: 80 U/L (ref 38–126)
ALT: 76 U/L — ABNORMAL HIGH (ref 17–63)
ANION GAP: 11 (ref 5–15)
AST: 48 U/L — ABNORMAL HIGH (ref 15–41)
Albumin: 3.8 g/dL (ref 3.5–5.0)
BUN: 16 mg/dL (ref 6–20)
CALCIUM: 9.8 mg/dL (ref 8.9–10.3)
CO2: 26 mmol/L (ref 22–32)
Chloride: 103 mmol/L (ref 101–111)
Creatinine, Ser: 1.2 mg/dL (ref 0.61–1.24)
GFR calc non Af Amer: >60 mL/min (ref >60)
Glucose, Bld: 98 mg/dL (ref 65–99)
POTASSIUM: 4.9 mmol/L (ref 3.5–5.1)
SODIUM: 140 mmol/L (ref 135–145)
Total Bilirubin: 0.6 mg/dL (ref 0.3–1.2)
Total Protein: 7.9 g/dL (ref 6.5–8.1)

## 2017-12-26 LAB — CBC
HCT: 39.5 % (ref 39.0–52.0)
Hemoglobin: 12.9 g/dL — ABNORMAL LOW (ref 13.0–17.0)
MCH: 27 pg (ref 26.0–34.0)
MCHC: 32.7 g/dL (ref 30.0–36.0)
MCV: 82.8 fL (ref 78.0–100.0)
PLATELETS: 226 10*3/uL (ref 150–400)
RBC: 4.77 MIL/uL (ref 4.22–5.81)
RDW: 12.9 % (ref 11.5–15.5)
WBC: 5.2 10*3/uL (ref 4.0–10.5)

## 2017-12-26 LAB — PROTIME-INR
INR: 1.02
Prothrombin Time: 13.3 seconds (ref 11.4–15.2)

## 2017-12-30 ENCOUNTER — Telehealth: Payer: Self-pay | Admitting: *Deleted

## 2017-12-30 NOTE — Progress Notes (Signed)
Pt daughter, Judson Roch, called today via phone , asking and confirming pt's instructions for surgery.  Judson Roch , speaks english, verbalized understanding ot pt to arrive at 1230 at [facility] and be npo after mn with exception clear liquids until 0830 (cream/ milk products).  Also, pt to do one fleet enema am dos. Directional instructions given to [facility].  Judson Roch stated to pt son , Roderic Palau, is bringing pt dos and also speaks english and will interpret.

## 2017-12-30 NOTE — Telephone Encounter (Signed)
Called patient's daughter to remind of procedure for 01-02-18, spoke with daughter Judson Roch and she is aware of this procedure

## 2018-01-02 ENCOUNTER — Ambulatory Visit (HOSPITAL_BASED_OUTPATIENT_CLINIC_OR_DEPARTMENT_OTHER): Payer: Self-pay | Admitting: Certified Registered Nurse Anesthetist

## 2018-01-02 ENCOUNTER — Encounter (HOSPITAL_BASED_OUTPATIENT_CLINIC_OR_DEPARTMENT_OTHER): Payer: Self-pay | Admitting: Anesthesiology

## 2018-01-02 ENCOUNTER — Encounter (HOSPITAL_BASED_OUTPATIENT_CLINIC_OR_DEPARTMENT_OTHER)
Admission: RE | Disposition: A | Discharge status: Home / Self Care | Payer: Self-pay | Source: Ambulatory Visit | Attending: Urology

## 2018-01-02 ENCOUNTER — Ambulatory Visit (HOSPITAL_COMMUNITY): Payer: Self-pay

## 2018-01-02 ENCOUNTER — Ambulatory Visit (HOSPITAL_BASED_OUTPATIENT_CLINIC_OR_DEPARTMENT_OTHER)
Admission: RE | Admit: 2018-01-02 | Discharge: 2018-01-02 | Disposition: A | Discharge status: Home / Self Care | Payer: Self-pay | Source: Ambulatory Visit | Attending: Urology | Admitting: Urology

## 2018-01-02 DIAGNOSIS — C61 Malignant neoplasm of prostate: Secondary | ICD-10-CM | POA: Insufficient documentation

## 2018-01-02 DIAGNOSIS — Z79899 Other long term (current) drug therapy: Secondary | ICD-10-CM | POA: Insufficient documentation

## 2018-01-02 DIAGNOSIS — Z6830 Body mass index (BMI) 30.0-30.9, adult: Secondary | ICD-10-CM | POA: Insufficient documentation

## 2018-01-02 DIAGNOSIS — E669 Obesity, unspecified: Secondary | ICD-10-CM | POA: Insufficient documentation

## 2018-01-02 DIAGNOSIS — N400 Enlarged prostate without lower urinary tract symptoms: Secondary | ICD-10-CM | POA: Insufficient documentation

## 2018-01-02 DIAGNOSIS — Z886 Allergy status to analgesic agent status: Secondary | ICD-10-CM | POA: Insufficient documentation

## 2018-01-02 HISTORY — DX: Prediabetes: R73.03

## 2018-01-02 HISTORY — PX: SPACE OAR INSTILLATION: SHX6769

## 2018-01-02 HISTORY — DX: Low back pain: M54.5

## 2018-01-02 HISTORY — PX: RADIOACTIVE SEED IMPLANT: SHX5150

## 2018-01-02 HISTORY — DX: Low back pain, unspecified: M54.50

## 2018-01-02 HISTORY — DX: Pain in left knee: M25.562

## 2018-01-02 SURGERY — INSERTION, RADIATION SOURCE, PROSTATE
Anesthesia: General | Site: Prostate

## 2018-01-02 MED ORDER — MIDAZOLAM HCL 5 MG/5ML IJ SOLN
INTRAMUSCULAR | Status: DC | PRN
  Administered 2018-01-02: 2 mg via INTRAVENOUS

## 2018-01-02 MED ORDER — STERILE WATER FOR IRRIGATION IR SOLN
Status: DC | PRN
  Administered 2018-01-02: 500 mL

## 2018-01-02 MED ORDER — MIDAZOLAM HCL 2 MG/2ML IJ SOLN
INTRAMUSCULAR | Status: AC
  Filled 2018-01-02: qty 2

## 2018-01-02 MED ORDER — SODIUM CHLORIDE 0.9 % IJ SOLN
INTRAMUSCULAR | Status: DC | PRN
  Administered 2018-01-02: 10 mL

## 2018-01-02 MED ORDER — EPHEDRINE 5 MG/ML INJ
INTRAVENOUS | Status: AC
  Filled 2018-01-02: qty 10

## 2018-01-02 MED ORDER — PROMETHAZINE HCL 25 MG/ML IJ SOLN
6.2500 mg | INTRAMUSCULAR | Status: DC | PRN
  Filled 2018-01-02: qty 1

## 2018-01-02 MED ORDER — FENTANYL CITRATE (PF) 100 MCG/2ML IJ SOLN
INTRAMUSCULAR | Status: AC
  Filled 2018-01-02: qty 2

## 2018-01-02 MED ORDER — PHENYLEPHRINE 40 MCG/ML (10ML) SYRINGE FOR IV PUSH (FOR BLOOD PRESSURE SUPPORT)
PREFILLED_SYRINGE | INTRAVENOUS | Status: AC
  Filled 2018-01-02: qty 10

## 2018-01-02 MED ORDER — EPHEDRINE SULFATE-NACL 50-0.9 MG/10ML-% IV SOSY
PREFILLED_SYRINGE | INTRAVENOUS | Status: DC | PRN
  Administered 2018-01-02 (×2): 10 mg via INTRAVENOUS

## 2018-01-02 MED ORDER — LIDOCAINE 2% (20 MG/ML) 5 ML SYRINGE
INTRAMUSCULAR | Status: DC | PRN
  Administered 2018-01-02: 100 mg via INTRAVENOUS

## 2018-01-02 MED ORDER — SODIUM CHLORIDE 0.9 % IV SOLN
INTRAVENOUS | Status: AC | PRN
  Administered 2018-01-02: 1000 mL

## 2018-01-02 MED ORDER — HYDROCODONE-ACETAMINOPHEN 7.5-325 MG PO TABS
1.0000 | ORAL_TABLET | Freq: Once | ORAL | Status: DC | PRN
  Filled 2018-01-02: qty 1

## 2018-01-02 MED ORDER — DEXAMETHASONE SODIUM PHOSPHATE 10 MG/ML IJ SOLN
INTRAMUSCULAR | Status: DC | PRN
Start: 2018-01-02 — End: 2018-01-02
  Administered 2018-01-02: 10 mg via INTRAVENOUS

## 2018-01-02 MED ORDER — ONDANSETRON HCL 4 MG/2ML IJ SOLN
INTRAMUSCULAR | Status: AC
  Filled 2018-01-02: qty 2

## 2018-01-02 MED ORDER — DEXAMETHASONE SODIUM PHOSPHATE 10 MG/ML IJ SOLN
INTRAMUSCULAR | Status: AC
  Filled 2018-01-02: qty 1

## 2018-01-02 MED ORDER — TRAMADOL HCL 50 MG PO TABS: 50.0000 mg | ORAL_TABLET | Freq: Four times a day (QID) | ORAL | 0 refills | Status: DC | PRN

## 2018-01-02 MED ORDER — IOHEXOL 300 MG/ML  SOLN
INTRAMUSCULAR | Status: DC | PRN
  Administered 2018-01-02: 7 mL

## 2018-01-02 MED ORDER — HYDROMORPHONE HCL 1 MG/ML IJ SOLN
0.2500 mg | INTRAMUSCULAR | Status: DC | PRN
Start: 2018-01-02 — End: 2018-01-02
  Filled 2018-01-02: qty 0.5

## 2018-01-02 MED ORDER — FENTANYL CITRATE (PF) 100 MCG/2ML IJ SOLN
INTRAMUSCULAR | Status: DC | PRN
  Administered 2018-01-02: 50 ug via INTRAVENOUS
  Administered 2018-01-02 (×2): 25 ug via INTRAVENOUS

## 2018-01-02 MED ORDER — LIDOCAINE 2% (20 MG/ML) 5 ML SYRINGE
INTRAMUSCULAR | Status: AC
Start: 2018-01-02 — End: 2018-01-02
  Filled 2018-01-02: qty 5

## 2018-01-02 MED ORDER — MEPERIDINE HCL 25 MG/ML IJ SOLN
6.2500 mg | INTRAMUSCULAR | Status: DC | PRN
  Filled 2018-01-02: qty 1

## 2018-01-02 MED ORDER — FLEET ENEMA 7-19 GM/118ML RE ENEM
1.0000 | ENEMA | Freq: Once | RECTAL | Status: AC
  Administered 2018-01-02: 1 via RECTAL
  Filled 2018-01-02: qty 1

## 2018-01-02 MED ORDER — PROPOFOL 10 MG/ML IV BOLUS
INTRAVENOUS | Status: DC | PRN
  Administered 2018-01-02: 200 mg via INTRAVENOUS

## 2018-01-02 MED ORDER — LACTATED RINGERS IV SOLN
INTRAVENOUS | Status: DC
  Administered 2018-01-02 (×2): via INTRAVENOUS
  Filled 2018-01-02: qty 1000

## 2018-01-02 MED ORDER — CEFAZOLIN SODIUM-DEXTROSE 2-4 GM/100ML-% IV SOLN
2.0000 g | Freq: Once | INTRAVENOUS | Status: AC
  Administered 2018-01-02: 2 g via INTRAVENOUS
  Filled 2018-01-02: qty 100

## 2018-01-02 MED ORDER — CEFAZOLIN SODIUM-DEXTROSE 2-4 GM/100ML-% IV SOLN
INTRAVENOUS | Status: AC
  Filled 2018-01-02: qty 100

## 2018-01-02 MED ORDER — ONDANSETRON HCL 4 MG/2ML IJ SOLN
INTRAMUSCULAR | Status: DC | PRN
  Administered 2018-01-02: 4 mg via INTRAVENOUS

## 2018-01-02 MED ORDER — PHENYLEPHRINE 40 MCG/ML (10ML) SYRINGE FOR IV PUSH (FOR BLOOD PRESSURE SUPPORT)
PREFILLED_SYRINGE | INTRAVENOUS | Status: DC | PRN
Start: 2018-01-02 — End: 2018-01-02
  Administered 2018-01-02: 120 ug via INTRAVENOUS
  Administered 2018-01-02: 80 ug via INTRAVENOUS
  Administered 2018-01-02: 120 ug via INTRAVENOUS
  Administered 2018-01-02: 80 ug via INTRAVENOUS

## 2018-01-02 MED FILL — traMADol HCL 50 MG TABS: 50 | 7 days supply | Qty: 30 | Fill #0

## 2018-01-02 SURGICAL SUPPLY — 29 items
BAG URINE DRAINAGE (UROLOGICAL SUPPLIES) ×2 IMPLANT
BLADE CLIPPER SURG (BLADE) ×2 IMPLANT
CATH FOLEY 2WAY SLVR  5CC 16FR (CATHETERS) ×2
CATH FOLEY 2WAY SLVR 5CC 16FR (CATHETERS) ×2 IMPLANT
CATH ROBINSON RED A/P 20FR (CATHETERS) ×2 IMPLANT
CLOTH BEACON ORANGE TIMEOUT ST (SAFETY) ×2 IMPLANT
COVER BACK TABLE 60X90IN (DRAPES) ×2 IMPLANT
COVER MAYO STAND STRL (DRAPES) ×2 IMPLANT
DRSG TEGADERM 4X4.75 (GAUZE/BANDAGES/DRESSINGS) ×2 IMPLANT
DRSG TEGADERM 8X12 (GAUZE/BANDAGES/DRESSINGS) ×2 IMPLANT
GAUZE SPONGE 4X4 12PLY STRL LF (GAUZE/BANDAGES/DRESSINGS) ×2 IMPLANT
GLOVE BIO SURGEON STRL SZ8 (GLOVE) ×2 IMPLANT
GLOVE BIOGEL PI IND STRL 7.5 (GLOVE) ×1 IMPLANT
GLOVE BIOGEL PI INDICATOR 7.5 (GLOVE) ×1
GLOVE ECLIPSE 8.0 STRL XLNG CF (GLOVE) ×6 IMPLANT
GOWN STRL REUS W/TWL XL LVL3 (GOWN DISPOSABLE) ×2 IMPLANT
HOLDER FOLEY CATH W/STRAP (MISCELLANEOUS) ×2 IMPLANT
IMPL SPACEOAR SYSTEM 10ML (MISCELLANEOUS) ×1 IMPLANT
IMPLANT SPACEOAR SYSTEM 10ML (MISCELLANEOUS) ×2
IV NS 1000ML (IV SOLUTION) ×2
IV NS 1000ML BAXH (IV SOLUTION) ×2 IMPLANT
KIT TURNOVER CYSTO (KITS) ×2 IMPLANT
PACK CYSTO (CUSTOM PROCEDURE TRAY) ×2 IMPLANT
SURGILUBE 2OZ TUBE FLIPTOP (MISCELLANEOUS) ×2 IMPLANT
SUT BONE WAX W31G (SUTURE) ×2 IMPLANT
SYRINGE 10CC LL (SYRINGE) ×4 IMPLANT
UNDERPAD 30X30 (UNDERPADS AND DIAPERS) ×4 IMPLANT
WATER STERILE IRR 500ML POUR (IV SOLUTION) ×2 IMPLANT
radioactive seed ×2 IMPLANT

## 2018-01-02 NOTE — Anesthesia Postprocedure Evaluation (Signed)
Anesthesia Post Note  Patient: Roy Reed  Procedure(s) Performed: RADIOACTIVE SEED IMPLANT/BRACHYTHERAPY IMPLANT (N/A Prostate) SPACE OAR INSTILLATION (N/A Prostate)     Patient location during evaluation: PACU Anesthesia Type: General Level of consciousness: awake and alert Pain management: pain level controlled Vital Signs Assessment: post-procedure vital signs reviewed and stable Respiratory status: spontaneous breathing, nonlabored ventilation, respiratory function stable and patient connected to nasal cannula oxygen Cardiovascular status: blood pressure returned to baseline and stable Postop Assessment: no apparent nausea or vomiting Anesthetic complications: no    Last Vitals:  Vitals:   01/02/18 1645 01/02/18 1700  BP: 132/82 122/85  Pulse: 80 76  Resp: 11 12  Temp:    SpO2: 100% 100%    Last Pain:  Vitals:   01/02/18 1700  TempSrc:   PainSc: 0-No pain                 Audreyana Huntsberry S

## 2018-01-02 NOTE — Transfer of Care (Signed)
Immediate Anesthesia Transfer of Care Note  Patient: Gilmer Laughridge  Procedure(s) Performed: RADIOACTIVE SEED IMPLANT/BRACHYTHERAPY IMPLANT (N/A Prostate) SPACE OAR INSTILLATION (N/A Prostate)  Patient Location: PACU  Anesthesia Type:General  Level of Consciousness: awake, alert  and oriented  Airway & Oxygen Therapy: Patient Spontanous Breathing and Patient connected to nasal cannula oxygen  Post-op Assessment: Report given to RN and Post -op Vital signs reviewed and stable  Post vital signs: Reviewed and stable  Last Vitals:  Vitals Value Taken Time  BP    Temp    Pulse 92 01/02/2018  4:17 PM  Resp 13 01/02/2018  4:17 PM  SpO2 100 % 01/02/2018  4:17 PM  Vitals shown include unvalidated device data.  Last Pain:  Vitals:   01/02/18 1326  TempSrc:   PainSc: 0-No pain      Patients Stated Pain Goal: 5 (48/[id number])  Complications: No apparent anesthesia complications

## 2018-01-02 NOTE — Discharge Instructions (Signed)
Indwelling Urinary Catheter Care, Adult Take good care of your catheter to keep it working and to prevent problems. How to wear your catheter Attach your catheter to your leg with tape (adhesive tape) or a leg strap. Make sure it is not too tight.  How to wear a drainage bag You should have:  A large overnight bag.  A small leg bag.  Overnight Bag You may wear the overnight bag at any time. Always keep the bag below the level of your bladder but off the floor. When you sleep, put a clean plastic bag in a wastebasket. Then hang the bag inside the wastebasket. Leg Bag Never wear the leg bag at night. Always wear the leg bag below your knee. Keep the leg bag secure with a leg strap or tape. How to care for your skin  Clean the skin around the catheter at least once every day.  Shower every day. Do not take baths.  Put creams, lotions, or ointments on your genital area only as told by your doctor.  Do not use powders, sprays, or lotions on your genital area. How to clean your catheter and your skin 1. Wash your hands with soap and water. 2. Wet a washcloth in warm water and gentle (mild) soap. 3. Use the washcloth to clean the skin where the catheter enters your body. Clean downward and wipe away from the catheter in small circles. Do not wipe toward the catheter. 4. Pat the area dry with a clean towel. Make sure to clean off all soap. How to care for your drainage bags Empty your drainage bag when it is ?- full or at least 2-3 times a day. Replace your drainage bag once a month or sooner if it starts to smell bad or look dirty. Do not clean your drainage bag unless told by your doctor. Emptying a drainage bag  Supplies Needed  Rubbing alcohol.  Gauze pad or cotton ball.  Tape or a leg strap.  Steps 1. Wash your hands with soap and water. 2. Separate (detach) the bag from your leg. 3. Hold the bag over the toilet or a clean container. Keep the bag below your hips and bladder.  This stops pee (urine) from going back into the tube. 4. Open the pour spout at the bottom of the bag. 5. Empty the pee into the toilet or container. Do not let the pour spout touch any surface. 6. Put rubbing alcohol on a gauze pad or cotton ball. 7. Use the gauze pad or cotton ball to clean the pour spout. 8. Close the pour spout. 9. Attach the bag to your leg with tape or a leg strap. 10. Wash your hands.  Changing a drainage bag Supplies Needed  Alcohol wipes.  A clean drainage bag.  Adhesive tape or a leg strap.  Steps 1. Wash your hands with soap and water. 2. Separate the dirty bag from your leg. 3. Pinch the rubber catheter with your fingers so that pee does not spill out. 4. Separate the catheter tube from the drainage tube where these tubes connect (at the connection valve). Do not let the tubes touch any surface. 5. Clean the end of the catheter tube with an alcohol wipe. Use a different alcohol wipe to clean the end of the drainage tube. 6. Connect the catheter tube to the drainage tube of the clean bag. 7. Attach the new bag to the leg with adhesive tape or a leg strap. 8. Wash your hands.  How to prevent infection and other problems  Never pull on your catheter or try to remove it. Pulling can damage tissue in your body.  Always wash your hands before and after touching your catheter.  If a leg strap gets wet, replace it with a dry one.  Drink enough fluids to keep your pee clear or pale yellow, or as told by your doctor.  Do not let the drainage bag or tubing touch the floor.  Wear cotton underwear.  If you are male, wipe from front to back after you poop (have a bowel movement).  Check on the catheter often to make sure it works and the tubing is not twisted. Get help if:  Your pee is cloudy.  Your pee smells unusually bad.  Your pee is not draining into the bag.  Your tube gets clogged.  Your catheter starts to leak.  Your bladder feels  full. Get help right away if:  You have redness, swelling, or pain where the catheter enters your body.  You have fluid, pus, or a bad smell coming from the area where the catheter enters your body.  The area where the catheter enters your body feels warm.  You have a fever.  You have pain in your: ? Stomach (abdomen). ? Legs. ? Lower back. ? Bladder.  You see blood fill the catheter.  Your pee is pink or red.  You feel sick to your stomach (nauseous).  You throw up (vomit).  You have chills.  Your catheter gets pulled out. This information is not intended to replace advice given to you by your health care provider. Make sure you discuss any questions you have with your health care provider. Document Released: 12/18/2012 Document Revised: 07/21/2016 Document Reviewed: 02/05/2014 Elsevier Interactive Patient Education  2018 [place]  Radioactive Seed Implant Home Care Instructions   Activity:    Rest for the remainder of the day.  Do not drive or operate equipment today.  You may resume normal activities in a few days as instructed by your physician, without risk of harmful radiation exposure to those around you, provided you follow the time and distance precautions on the Radiation Oncology Instruction Sheet.   Meals: Drink plenty of lipuids and eat light foods, such as gelatin or soup this evening .  You may return to normal meal plan tomorrow.  Return To Work: You may return to work as instructed by Naval architect.  Special Instruction:   If any seeds are found, use tweezers to pick up seeds and place in a glass container of any kind and bring to your physician's office.  Call your physician if any of these symptoms occur:   Persistent or heavy bleeding  Urine stream diminishes or stops completely after catheter is removed  Fever equal to or greater than 101 degrees F  Cloudy urine with a strong foul odor  Severe pain  You may feel some burning pain  and/or hesitancy when you urinate after the catheter is removed.  These symptoms may increase over the next few weeks, but should diminish within forur to six weeks.  Applying moist heat to the lower abdomen or a hot tub bath may help relieve the pain.  If the discomfort becomes severe, please call your physician for additional medications.        Post Anesthesia Home Care Instructions  Activity: Get plenty of rest for the remainder of the day. A responsible individual must stay with you for 24 hours following the  procedure.  For the next 24 hours, DO NOT: -Drive a car -Paediatric nurse -Drink alcoholic beverages -Take any medication unless instructed by your physician -Make any legal decisions or sign important papers.  Meals: Start with liquid foods such as gelatin or soup. Progress to regular foods as tolerated. Avoid greasy, spicy, heavy foods. If nausea and/or vomiting occur, drink only clear liquids until the nausea and/or vomiting subsides. Call your physician if vomiting continues.  Special Instructions/Symptoms: Your throat may feel dry or sore from the anesthesia or the breathing tube placed in your throat during surgery. If this causes discomfort, gargle with warm salt water. The discomfort should disappear within 24 hours.  If you had a scopolamine patch placed behind your ear for the management of post- operative nausea and/or vomiting:  1. The medication in the patch is effective for 72 hours, after which it should be removed.  Wrap patch in a tissue and discard in the trash. Wash hands thoroughly with soap and water. 2. You may remove the patch earlier than 72 hours if you experience unpleasant side effects which may include dry mouth, dizziness or visual disturbances. 3. Avoid touching the patch. Wash your hands with soap and water after contact with the patch.

## 2018-01-02 NOTE — Op Note (Signed)
PRE-OPERATIVE DIAGNOSIS:  Adenocarcinoma of the prostate  POST-OPERATIVE DIAGNOSIS:  Same  PROCEDURE:  Procedure(s): 1. I-125 radioactive seed implantation 2. Cystoscopy 3. Placement of SpaceOAR  SURGEON:  Surgeon(s): Nicolette Bang, MD  Radiation oncologist: Tyler Pita, MD  ANESTHESIA:  General  EBL:  Minimal  DRAINS: 45 French Foley catheter  INDICATION: Roy Reed is a 62 year old with a history of T2c prostate cancer. After discussing treatment options he has elected to proceed with brachytherapy  Description of procedure: After informed consent the patient was brought to the major OR, placed on the table and administered general anesthesia. He was then moved to the modified lithotomy position with his perineum perpendicular to the floor. His perineum and genitalia were then sterilely prepped. An official timeout was then performed. A 16 French Foley catheter was then placed in the bladder and filled with dilute contrast, a rectal tube was placed in the rectum and the transrectal ultrasound probe was placed in the rectum and affixed to the stand. He was then sterilely draped.  Real time ultrasonography was used along with the seed planning software On[employer]ra Prostate vs. 4.2.21. This was used to develop the seed plan including the number of needles as well as number of seeds required for complete and adequate coverage. Real-time ultrasonography was then used along with the previously developed plan and the Nucletron device to implant a total of 52 seeds using 15 needles. This proceeded without difficulty or complication.  We then proceeded to mix the SpaceOAR using the kit supplied from the manufacturer. Once this was complete we placed a sinal needle into the perirectal fat between the rectum and the prostate. Once this was accomplished we injected 2cc of normal saline to hydrodissect the plain. We then instilled the the SpaceOAR through the spinal needle and noted good  distribution in the perirectal fat.   A Foley catheter was then removed as well as the transrectal ultrasound probe and rectal probe. Flexible cystoscopy was then performed using the 17 French flexible scope which revealed a normal urethra throughout its length down to the sphincter which appeared intact. The prostatic urethra revealed bilobar hypertrophy but no evidence of obstruction, seeds, spacers or lesions. The bladder was then entered and fully and systematically inspected. The ureteral orifices were noted to be of normal configuration and position. The mucosa revealed no evidence of tumors. There were also no stones identified within the bladder. I noted no seeds or spacers on the floor of the bladder and retroflexion of the scope revealed no seeds protruding from the base of the prostate.  The cystoscope was then removed and a new 73 French Foley catheter was then inserted and the balloon was filled with 10 cc of sterile water. This was connected to closed system drainage and the patient was awakened and taken to recovery room in stable and satisfactory condition. He tolerated procedure well and there were no intraoperative complications.

## 2018-01-02 NOTE — Anesthesia Procedure Notes (Signed)
Procedure Name: LMA Insertion Date/Time: 01/02/2018 2:51 PM Performed by: Genelle Bal, CRNA Pre-anesthesia Checklist: Patient identified, Emergency Drugs available, Suction available and Patient being monitored Patient Re-evaluated:Patient Re-evaluated prior to induction Oxygen Delivery Method: Circle system utilized Preoxygenation: Pre-oxygenation with 100% oxygen Induction Type: IV induction Ventilation: Mask ventilation without difficulty LMA: LMA inserted LMA Size: 5.0 Number of attempts: 1 Airway Equipment and Method: Bite block Placement Confirmation: positive ETCO2 Tube secured with: Tape Dental Injury: Teeth and Oropharynx as per pre-operative assessment

## 2018-01-02 NOTE — Anesthesia Preprocedure Evaluation (Addendum)
Anesthesia Evaluation  Patient identified by MRN, date of birth, ID band Patient awake    Reviewed: Allergy & Precautions, NPO status , Patient's Chart, lab work & pertinent test results  Airway Mallampati: II  TM Distance: >3 FB Neck ROM: Full    Dental  (+) Teeth Intact,    Pulmonary neg pulmonary ROS,    Pulmonary exam normal breath sounds clear to auscultation       Cardiovascular negative cardio ROS Normal cardiovascular exam Rhythm:Regular Rate:Normal     Neuro/Psych negative neurological ROS  negative psych ROS   GI/Hepatic negative GI ROS, Mildly elevated LFT's   Endo/Other  Obesity Pre Diabetes  Renal/GU negative Renal ROS   Prostate Ca    Musculoskeletal negative musculoskeletal ROS (+)   Abdominal (+) + obese,   Peds  Hematology negative hematology ROS (+)   Anesthesia Other Findings   Reproductive/Obstetrics                            Anesthesia Physical Anesthesia Plan  ASA: II  Anesthesia Plan: General   Post-op Pain Management:    Induction: Intravenous  PONV Risk Score and Plan: 4 or greater and Ondansetron, Dexamethasone, Midazolam and Treatment may vary due to age or medical condition  Airway Management Planned: LMA  Additional Equipment:   Intra-op Plan:   Post-operative Plan: Extubation in OR  Informed Consent: I have reviewed the patients History and Physical, chart, labs and discussed the procedure including the risks, benefits and alternatives for the proposed anesthesia with the patient or authorized representative who has indicated his/her understanding and acceptance.   Dental advisory given  Plan Discussed with: CRNA, Anesthesiologist and Surgeon  Anesthesia Plan Comments:         Anesthesia Quick Evaluation

## 2018-01-02 NOTE — H&P (Signed)
Urology Admission H&P  Chief Complaint: prostate cancer  History of Present Illness: Roy Reed is a 62yo with high risk prostate cancer here for brachytherapy and SpaceOAR. He denies any worsening LUTS. No hematuria or dysuria  Past Medical History:  Diagnosis Date  . Elevated PSA   . Left knee pain   . Low back pain   . Pre-diabetes   . Prostate cancer ([place])   . Rotator cuff disorder, right    Past Surgical History:  Procedure Laterality Date  . COLONOSCOPY    . PROSTATE BIOPSY    . PROSTATE BIOPSY    . SHOULDER ARTHROSCOPY WITH ROTATOR CUFF REPAIR AND SUBACROMIAL DECOMPRESSION Right 08/05/2017   Procedure: RIGHT SHOULDER ARTHROSCOPY WITH ROTATOR CUFF REPAIR, DISTAL CLAVICLE EXCISION, DEBRIDEMENT AND SUBACROMIAL DECOMPRESSION;  Surgeon: Leandrew Koyanagi, MD;  Location: [facility];  Service: Orthopedics;  Laterality: Right;    Home Medications:  Current Facility-Administered Medications  Medication Dose Route Frequency Provider Last Rate Last Dose  . ceFAZolin (ANCEF) IVPB 2g/100 mL premix  2 g Intravenous Once Shynia Daleo, Candee Furbish, MD      . lactated ringers infusion   Intravenous Continuous Josephine Igo, MD 50 mL/hr at 01/02/18 1337     Allergies:  Allergies  Allergen Reactions  . Aspirin     States he can take if he takes acid reducer medicine first    History reviewed. No pertinent family history. Social History:  reports that he has never smoked. He has never used smokeless tobacco. He reports that he drank alcohol. He reports that he does not use drugs.  Review of Systems  All other systems reviewed and are negative.   Physical Exam:  Vital signs in last 24 hours: Temp:  [98.3 F (36.8 C)] 98.3 F (36.8 C) (04/29 1254) Pulse Rate:  [74] 74 (04/29 1254) Resp:  [18] 18 (04/29 1254) BP: (122)/(75) 122/75 (04/29 1254) SpO2:  [100 %] 100 % (04/29 1254) Weight:  [109.3 kg (241 lb)] 109.3 kg (241 lb) (04/29 1254) Physical Exam  Constitutional: He is  oriented to person, place, and time. He appears well-developed and well-nourished.  HENT:  Head: Normocephalic and atraumatic.  Eyes: Pupils are equal, round, and reactive to light. EOM are normal.  Neck: Normal range of motion. No thyromegaly present.  Cardiovascular: Normal rate and regular rhythm.  Respiratory: Effort normal. No respiratory distress.  GI: Soft. He exhibits no distension.  Musculoskeletal: Normal range of motion. He exhibits no edema.  Neurological: He is alert and oriented to person, place, and time.  Skin: Skin is warm and dry.  Psychiatric: He has a normal mood and affect. His behavior is normal. Judgment and thought content normal.    Laboratory Data:  No results found for this or any previous visit (from the past 24 hour(s)). No results found for this or any previous visit (from the past 240 hour(s)). Creatinine: No results for input(s): CREATININE in the last 168 hours. Baseline Creatinine: unknown  Impression/Assessment:  61yo with high risk prostate cancer  Plan:  The risks/benefits/alternatives to brachytherapy with SpaceOAR was explained to the patient and he understands and wishes to proceed with surgery  Roy Reed 01/02/2018, 1:43 PM

## 2018-01-03 ENCOUNTER — Encounter (HOSPITAL_BASED_OUTPATIENT_CLINIC_OR_DEPARTMENT_OTHER): Payer: Self-pay | Admitting: Urology

## 2018-01-05 NOTE — Progress Notes (Signed)
  Radiation Oncology         [phone] ________________________________  Name: Roy Reed MRN: [id number]  Date: 01/05/2018  DOB: 03-07-1956       Prostate Seed Implant  YM:EBRAXEN, [name], MD  No ref. provider found  DIAGNOSIS: 63 y.o. gentleman with Stage T1c adenocarcinoma of the prostate with Gleason Score of 4+3, and PSA of 50.4  PROCEDURE: Insertion of radioactive I-125 seeds into the prostate gland.  RADIATION DOSE: 110 Gy, boost therapy.  TECHNIQUE: Roy Reed was brought to the operating room with the urologist. He was placed in the dorsolithotomy position. He was catheterized and a rectal tube was inserted. The perineum was shaved, prepped and draped. The ultrasound probe was then introduced into the rectum to see the prostate gland.  TREATMENT DEVICE: A needle grid was attached to the ultrasound probe stand and anchor needles were placed.  3D PLANNING: The prostate was imaged in 3D using a sagittal sweep of the prostate probe. These images were transferred to the planning computer. There, the prostate, urethra and rectum were defined on each axial reconstructed image. Then, the software created an optimized 3D plan and a few seed positions were adjusted. The quality of the plan was reviewed using [facility] information for the target and the following two organs at risk:  Urethra and Rectum.  Then the accepted plan was uploaded to the seed Selectron afterloading unit.  PROSTATE VOLUME STUDY:  Using transrectal ultrasound the volume of the prostate was verified to be 20.9 cc.  SPECIAL TREATMENT PROCEDURE/SUPERVISION AND HANDLING: The Nucletron FIRST system was used to place the needles under sagittal guidance. A total of 15 needles were used to deposit 52 seeds in the prostate gland. The individual seed activity was 0.293 mCi.  SpaceOAR:  Yes  COMPLEX SIMULATION: At the end of the procedure, an anterior radiograph of the pelvis was obtained to document seed positioning  and count. Cystoscopy was performed to check the urethra and bladder.  MICRODOSIMETRY: At the end of the procedure, the patient was emitting 0.057 mR/hr at 1 meter. Accordingly, he was considered safe for hospital discharge.  PLAN: The patient will return to the radiation oncology clinic for post implant CT dosimetry in three weeks.   ________________________________  Sheral Apley Tammi Klippel, M.D.

## 2018-01-12 ENCOUNTER — Telehealth: Payer: Self-pay | Admitting: *Deleted

## 2018-01-12 NOTE — Telephone Encounter (Signed)
CALLED PATIENT TO REMIND OF POST SEED APPTS. AND HIS MRI FOR 01-13-18, SPOKE WITH PATIENT'S DAUGHTER - SARAH AND SHE IS AWARE OF THESE APPTS.

## 2018-01-13 ENCOUNTER — Ambulatory Visit
Admission: RE | Admit: 2018-01-13 | Discharge: 2018-01-13 | Disposition: A | Discharge status: Home / Self Care | Payer: Self-pay | Source: Ambulatory Visit | Attending: Radiation Oncology | Admitting: Radiation Oncology

## 2018-01-13 ENCOUNTER — Ambulatory Visit (HOSPITAL_COMMUNITY)
Admission: RE | Admit: 2018-01-13 | Discharge: 2018-01-13 | Disposition: A | Discharge status: Home / Self Care | Payer: Self-pay | Source: Ambulatory Visit | Attending: Urology | Admitting: Urology

## 2018-01-13 DIAGNOSIS — C61 Malignant neoplasm of prostate: Secondary | ICD-10-CM | POA: Insufficient documentation

## 2018-01-13 DIAGNOSIS — Z510 Encounter for antineoplastic radiation therapy: Secondary | ICD-10-CM | POA: Insufficient documentation

## 2018-01-13 NOTE — Progress Notes (Signed)
  Radiation Oncology         [phone] ________________________________  Name: Roy Reed MRN: [id number]  Date: 01/13/2018  DOB: [birth year]  COMPLEX SIMULATION NOTE  NARRATIVE:  The patient was brought to the [facility] today following prostate seed implantation approximately one month ago.  Identity was confirmed.  All relevant records and images related to the planned course of therapy were reviewed.  Then, the patient was set-up supine.  CT images were obtained.  The CT images were loaded into the planning software.  Then the prostate and rectum were contoured.  Treatment planning then occurred.  The implanted iodine 125 seeds were identified by the physics staff for projection of radiation distribution  I have requested : 3D Simulation  I have requested a DVH of the following structures: Prostate and rectum.    ________________________________  Sheral Apley Tammi Klippel, M.D.  This document serves as a record of services personally performed by Tyler Pita, MD. It was created on his behalf by Arlyce Harman, a trained medical scribe. The creation of this record is based on the scribe's personal observations and the provider's statements to them. This document has been checked and approved by the attending provider.

## 2018-01-14 NOTE — Progress Notes (Signed)
  Radiation Oncology         [phone]) [phone] ________________________________  Name: Roy Reed MRN: [id number]  Date: 01/13/2018  DOB: 09-22-1955  SIMULATION AND TREATMENT PLANNING NOTE    ICD-10-CM   1. Malignant neoplasm of prostate ([place]) C61     DIAGNOSIS:  62 y.o. 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  NARRATIVE:  The patient was brought to the [facility].  Identity was confirmed.  All relevant records and images related to the planned course of therapy were reviewed.  The patient freely provided informed written consent to proceed with treatment after reviewing the details related to the planned course of therapy. The consent form was witnessed and verified by the simulation staff.  Then, the patient was set-up in a stable reproducible supine position for radiation therapy.  A vacuum lock pillow device was custom fabricated to position his legs in a reproducible immobilized position.  Then, I performed a urethrogram under sterile conditions to identify the prostatic apex.  CT images were obtained.  Surface markings were placed.  The CT images were loaded into the planning software.  Then the prostate target and avoidance structures including the rectum, bladder, bowel and hips were contoured.  Treatment planning then occurred.  The radiation prescription was entered and confirmed.  A total of one complex treatment devices were fabricated. I have requested : Intensity Modulated Radiotherapy (IMRT) is medically necessary for this case for the following reason:  Rectal sparing.Marland Kitchen  PLAN:  The patient will receive 45 Gy in 25 fractions of 1.8 Gy, to supplement an up-front prostate seed implant boost of 110 Gy to achieve a total nominal dose of 155 Gy.  ________________________________  Sheral Apley Tammi Klippel, M.D.

## 2018-01-16 MED FILL — ?ATORVASTATIN 10MG TABLET: 10 | 30 days supply | Qty: 30 | Fill #2

## 2018-01-17 ENCOUNTER — Encounter: Payer: Self-pay | Admitting: Medical Oncology

## 2018-01-19 ENCOUNTER — Encounter: Payer: Self-pay | Admitting: Radiation Oncology

## 2018-01-19 NOTE — Progress Notes (Signed)
  Radiation Oncology         [phone] ________________________________  Name: Roy Reed MRN: [id number]  Date: 01/19/2018  DOB: 1956-07-22  3D Planning Note   Prostate Brachytherapy Post-Implant Dosimetry  Diagnosis: 62 y.o. 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  Narrative: On a previous date, Roy Reed returned following prostate seed implantation for post implant planning. He underwent CT scan complex simulation to delineate the three-dimensional structures of the pelvis and demonstrate the radiation distribution.  Since that time, the seed localization, and complex isodose planning with dose volume histograms have now been completed.  Results:   Prostate Coverage - The dose of radiation delivered to the 90% or more of the prostate gland (D90) was 111.15% of the prescription dose. This exceeds our goal of greater than 90%. Rectal Sparing - The volume of rectal tissue receiving the prescription dose or higher was 0.0 cc. This falls under our thresholds tolerance of 1.0 cc.  Impression: The prostate seed implant appears to show adequate target coverage and appropriate rectal sparing.  Plan:  The patient will continue to follow with urology for ongoing PSA determinations. I would anticipate a high likelihood for local tumor control with minimal risk for rectal morbidity.  ________________________________  Sheral Apley Tammi Klippel, M.D.

## 2018-01-24 ENCOUNTER — Ambulatory Visit: Payer: Self-pay

## 2018-01-24 ENCOUNTER — Encounter: Payer: Self-pay | Admitting: Medical Oncology

## 2018-01-24 ENCOUNTER — Ambulatory Visit
Admission: RE | Admit: 2018-01-24 | Discharge: 2018-01-24 | Disposition: A | Discharge status: Home / Self Care | Payer: Self-pay | Source: Ambulatory Visit | Attending: Radiation Oncology | Admitting: Radiation Oncology

## 2018-01-25 ENCOUNTER — Ambulatory Visit: Payer: Self-pay

## 2018-01-25 ENCOUNTER — Ambulatory Visit
Admission: RE | Admit: 2018-01-25 | Discharge: 2018-01-25 | Disposition: A | Discharge status: Home / Self Care | Payer: Self-pay | Source: Ambulatory Visit | Attending: Radiation Oncology | Admitting: Radiation Oncology

## 2018-01-26 ENCOUNTER — Ambulatory Visit
Admission: RE | Admit: 2018-01-26 | Discharge: 2018-01-26 | Disposition: A | Discharge status: Home / Self Care | Payer: Self-pay | Source: Ambulatory Visit | Attending: Radiation Oncology | Admitting: Radiation Oncology

## 2018-01-26 ENCOUNTER — Ambulatory Visit: Payer: Self-pay

## 2018-01-27 ENCOUNTER — Ambulatory Visit
Admission: RE | Admit: 2018-01-27 | Discharge: 2018-01-27 | Disposition: A | Discharge status: Home / Self Care | Payer: Self-pay | Source: Ambulatory Visit | Attending: Radiation Oncology | Admitting: Radiation Oncology

## 2018-01-27 ENCOUNTER — Ambulatory Visit: Payer: Self-pay

## 2018-01-31 ENCOUNTER — Ambulatory Visit
Admission: RE | Admit: 2018-01-31 | Discharge: 2018-01-31 | Disposition: A | Discharge status: Home / Self Care | Payer: Self-pay | Source: Ambulatory Visit | Attending: Radiation Oncology | Admitting: Radiation Oncology

## 2018-01-31 ENCOUNTER — Ambulatory Visit: Payer: Self-pay

## 2018-01-31 MED FILL — ALFUZOSIN HCL ER 10 MG TAB: 10 | 30 days supply | Qty: 30 | Fill #0

## 2018-02-01 ENCOUNTER — Ambulatory Visit: Payer: Self-pay

## 2018-02-01 ENCOUNTER — Ambulatory Visit
Admission: RE | Admit: 2018-02-01 | Discharge: 2018-02-01 | Disposition: A | Discharge status: Home / Self Care | Payer: Self-pay | Source: Ambulatory Visit | Attending: Radiation Oncology | Admitting: Radiation Oncology

## 2018-02-02 ENCOUNTER — Ambulatory Visit: Payer: Self-pay

## 2018-02-02 ENCOUNTER — Ambulatory Visit
Admission: RE | Admit: 2018-02-02 | Discharge: 2018-02-02 | Disposition: A | Discharge status: Home / Self Care | Payer: Self-pay | Source: Ambulatory Visit | Attending: Radiation Oncology | Admitting: Radiation Oncology

## 2018-02-02 ENCOUNTER — Encounter: Payer: Self-pay | Admitting: Medical Oncology

## 2018-02-03 ENCOUNTER — Ambulatory Visit: Payer: Self-pay

## 2018-02-03 ENCOUNTER — Ambulatory Visit
Admission: RE | Admit: 2018-02-03 | Discharge: 2018-02-03 | Disposition: A | Discharge status: Home / Self Care | Payer: Self-pay | Source: Ambulatory Visit | Attending: Radiation Oncology | Admitting: Radiation Oncology

## 2018-02-06 ENCOUNTER — Ambulatory Visit
Admission: RE | Admit: 2018-02-06 | Discharge: 2018-02-06 | Disposition: A | Discharge status: Home / Self Care | Payer: Self-pay | Source: Ambulatory Visit | Attending: Radiation Oncology | Admitting: Radiation Oncology

## 2018-02-06 DIAGNOSIS — C61 Malignant neoplasm of prostate: Secondary | ICD-10-CM | POA: Insufficient documentation

## 2018-02-06 DIAGNOSIS — Z510 Encounter for antineoplastic radiation therapy: Secondary | ICD-10-CM | POA: Insufficient documentation

## 2018-02-07 ENCOUNTER — Ambulatory Visit
Admission: RE | Admit: 2018-02-07 | Discharge: 2018-02-07 | Disposition: A | Discharge status: Home / Self Care | Payer: Self-pay | Source: Ambulatory Visit | Attending: Radiation Oncology | Admitting: Radiation Oncology

## 2018-02-08 ENCOUNTER — Ambulatory Visit
Admission: RE | Admit: 2018-02-08 | Discharge: 2018-02-08 | Disposition: A | Discharge status: Home / Self Care | Payer: Self-pay | Source: Ambulatory Visit | Attending: Radiation Oncology | Admitting: Radiation Oncology

## 2018-02-09 ENCOUNTER — Ambulatory Visit
Admission: RE | Admit: 2018-02-09 | Discharge: 2018-02-09 | Disposition: A | Discharge status: Home / Self Care | Payer: Self-pay | Source: Ambulatory Visit | Attending: Radiation Oncology | Admitting: Radiation Oncology

## 2018-02-10 ENCOUNTER — Ambulatory Visit
Admission: RE | Admit: 2018-02-10 | Discharge: 2018-02-10 | Disposition: A | Discharge status: Home / Self Care | Payer: Self-pay | Source: Ambulatory Visit | Attending: Radiation Oncology | Admitting: Radiation Oncology

## 2018-02-13 ENCOUNTER — Ambulatory Visit (INDEPENDENT_AMBULATORY_CARE_PROVIDER_SITE_OTHER): Payer: Self-pay | Admitting: Orthopaedic Surgery

## 2018-02-13 ENCOUNTER — Ambulatory Visit
Admission: RE | Admit: 2018-02-13 | Discharge: 2018-02-13 | Disposition: A | Discharge status: Home / Self Care | Payer: Self-pay | Source: Ambulatory Visit | Attending: Radiation Oncology | Admitting: Radiation Oncology

## 2018-02-14 ENCOUNTER — Ambulatory Visit (INDEPENDENT_AMBULATORY_CARE_PROVIDER_SITE_OTHER): Payer: Self-pay | Admitting: Orthopaedic Surgery

## 2018-02-14 ENCOUNTER — Encounter (INDEPENDENT_AMBULATORY_CARE_PROVIDER_SITE_OTHER): Payer: Self-pay | Admitting: Orthopaedic Surgery

## 2018-02-14 ENCOUNTER — Ambulatory Visit
Admission: RE | Admit: 2018-02-14 | Discharge: 2018-02-14 | Disposition: A | Discharge status: Home / Self Care | Payer: Self-pay | Source: Ambulatory Visit | Attending: Radiation Oncology | Admitting: Radiation Oncology

## 2018-02-14 DIAGNOSIS — M75101 Unspecified rotator cuff tear or rupture of right shoulder, not specified as traumatic: Secondary | ICD-10-CM

## 2018-02-14 NOTE — Progress Notes (Signed)
Office Visit Note   Patient: Roy Reed           Date of Birth: 1955-11-04           MRN: [id number] Visit Date: 02/14/2018              Requested by: Ladell Pier, MD [address] [place], [place] [address] PCP: Ladell Pier, MD   Assessment & Plan: Visit Diagnoses:  1. Rotator cuff syndrome, right     Plan: At this point I have cleared him to return to tennis although he needs to listen to his body and but his symptoms got him as to what he is doing.  I want him to be careful with overhead motions.  I will start with just some light groundstrokes.  He understands to be careful and we will see him back as needed.  Follow-Up Instructions: Return if symptoms worsen or fail to improve.   Orders:  No orders of the defined types were placed in this encounter.  No orders of the defined types were placed in this encounter.     Procedures: No procedures performed   Clinical Data: No additional findings.   Subjective: Chief Complaint  Patient presents with  . Right Shoulder - Pain    Patient is 6 months status post right rotator cuff repair and shoulder arthroscopy.  He is doing very well.  His range of motion is significantly improved.  He does not take any pain medicines and denies any pain.   Review of Systems   Objective: Vital Signs: There were no vitals taken for this visit.  Physical Exam  Ortho Exam Right shoulder exam shows fully healed surgical scars.  He has excellent forward flexion internal rotation external rotation.  He lacks about 5 to 10 degrees of full range of motion.  His rotator cuff testing is intact and with good strength. Specialty Comments:  No specialty comments available.  Imaging: No results found.   PMFS History: Patient Active Problem List   Diagnosis Date Noted  . Malignant neoplasm of prostate ([facility]) 10/13/2017  . Rotator cuff tear arthropathy of right shoulder 08/31/2017  . Follow up 08/31/2017  .  Personal history of gastric ulcer 02/09/2017   Past Medical History:  Diagnosis Date  . Elevated PSA   . Left knee pain   . Low back pain   . Pre-diabetes   . Prostate cancer ([place])   . Rotator cuff disorder, right     History reviewed. No pertinent family history.  Past Surgical History:  Procedure Laterality Date  . COLONOSCOPY    . PROSTATE BIOPSY    . PROSTATE BIOPSY    . RADIOACTIVE SEED IMPLANT N/A 01/02/2018   Procedure: RADIOACTIVE SEED IMPLANT/BRACHYTHERAPY IMPLANT;  Surgeon: Cleon Gustin, MD;  Location: [facility];  Service: Urology;  Laterality: N/A;  . SHOULDER ARTHROSCOPY WITH ROTATOR CUFF REPAIR AND SUBACROMIAL DECOMPRESSION Right 08/05/2017   Procedure: RIGHT SHOULDER ARTHROSCOPY WITH ROTATOR CUFF REPAIR, DISTAL CLAVICLE EXCISION, DEBRIDEMENT AND SUBACROMIAL DECOMPRESSION;  Surgeon: Leandrew Koyanagi, MD;  Location: [facility];  Service: Orthopedics;  Laterality: Right;  . SPACE OAR INSTILLATION N/A 01/02/2018   Procedure: SPACE OAR INSTILLATION;  Surgeon: Cleon Gustin, MD;  Location: [facility];  Service: Urology;  Laterality: N/A;   Social History   Occupational History  . Not on file  Tobacco Use  . Smoking status: Never Smoker  . Smokeless tobacco: Never Used  Substance and Sexual Activity  . Alcohol use: Not Currently  . Drug use: No  . Sexual activity: Not on file

## 2018-02-15 ENCOUNTER — Ambulatory Visit
Admission: RE | Admit: 2018-02-15 | Discharge: 2018-02-15 | Disposition: A | Discharge status: Home / Self Care | Payer: Self-pay | Source: Ambulatory Visit | Attending: Radiation Oncology | Admitting: Radiation Oncology

## 2018-02-16 ENCOUNTER — Ambulatory Visit
Admission: RE | Admit: 2018-02-16 | Discharge: 2018-02-16 | Disposition: A | Discharge status: Home / Self Care | Payer: Self-pay | Source: Ambulatory Visit | Attending: Radiation Oncology | Admitting: Radiation Oncology

## 2018-02-17 ENCOUNTER — Ambulatory Visit
Admission: RE | Admit: 2018-02-17 | Discharge: 2018-02-17 | Disposition: A | Discharge status: Home / Self Care | Payer: Self-pay | Source: Ambulatory Visit | Attending: Radiation Oncology | Admitting: Radiation Oncology

## 2018-02-20 ENCOUNTER — Ambulatory Visit
Admission: RE | Admit: 2018-02-20 | Discharge: 2018-02-20 | Disposition: A | Discharge status: Home / Self Care | Payer: Self-pay | Source: Ambulatory Visit | Attending: Radiation Oncology | Admitting: Radiation Oncology

## 2018-02-21 ENCOUNTER — Ambulatory Visit
Admission: RE | Admit: 2018-02-21 | Discharge: 2018-02-21 | Disposition: A | Discharge status: Home / Self Care | Payer: Self-pay | Source: Ambulatory Visit | Attending: Radiation Oncology | Admitting: Radiation Oncology

## 2018-02-22 ENCOUNTER — Ambulatory Visit: Payer: Self-pay

## 2018-02-22 ENCOUNTER — Ambulatory Visit
Admission: RE | Admit: 2018-02-22 | Discharge: 2018-02-22 | Disposition: A | Discharge status: Home / Self Care | Payer: Self-pay | Source: Ambulatory Visit | Attending: Radiation Oncology | Admitting: Radiation Oncology

## 2018-02-23 ENCOUNTER — Ambulatory Visit
Admission: RE | Admit: 2018-02-23 | Discharge: 2018-02-23 | Disposition: A | Discharge status: Home / Self Care | Payer: Self-pay | Source: Ambulatory Visit | Attending: Radiation Oncology | Admitting: Radiation Oncology

## 2018-02-23 MED FILL — ?ATORVASTATIN 10MG TABLET: 10 | 30 days supply | Qty: 30 | Fill #3

## 2018-02-24 ENCOUNTER — Ambulatory Visit
Admission: RE | Admit: 2018-02-24 | Discharge: 2018-02-24 | Disposition: A | Discharge status: Home / Self Care | Payer: Self-pay | Source: Ambulatory Visit | Attending: Radiation Oncology | Admitting: Radiation Oncology

## 2018-02-24 DIAGNOSIS — C61 Malignant neoplasm of prostate: Secondary | ICD-10-CM

## 2018-02-24 LAB — URINALYSIS, COMPLETE (UACMP) WITH MICROSCOPIC
BACTERIA UA: NONE SEEN
Bilirubin Urine: NEGATIVE
GLUCOSE, UA: NEGATIVE mg/dL
KETONES UR: NEGATIVE mg/dL
Leukocytes, UA: NEGATIVE
NITRITE: NEGATIVE
PH: 7 (ref 5.0–8.0)
Protein, ur: NEGATIVE mg/dL
Specific Gravity, Urine: 1.004 — ABNORMAL LOW (ref 1.005–1.030)

## 2018-02-27 ENCOUNTER — Encounter: Payer: Self-pay | Admitting: Medical Oncology

## 2018-02-27 ENCOUNTER — Ambulatory Visit
Admission: RE | Admit: 2018-02-27 | Discharge: 2018-02-27 | Disposition: A | Discharge status: Home / Self Care | Payer: Self-pay | Source: Ambulatory Visit | Attending: Radiation Oncology | Admitting: Radiation Oncology

## 2018-02-27 ENCOUNTER — Ambulatory Visit: Payer: Self-pay

## 2018-02-27 MED FILL — ALFUZOSIN HCL ER 10 MG TAB: 10 | 30 days supply | Qty: 30 | Fill #1

## 2018-02-28 ENCOUNTER — Ambulatory Visit: Payer: Self-pay

## 2018-02-28 ENCOUNTER — Ambulatory Visit: Payer: Self-pay | Attending: Internal Medicine

## 2018-02-28 ENCOUNTER — Ambulatory Visit
Admission: RE | Admit: 2018-02-28 | Discharge: 2018-02-28 | Disposition: A | Discharge status: Home / Self Care | Payer: Self-pay | Source: Ambulatory Visit | Attending: Radiation Oncology | Admitting: Radiation Oncology

## 2018-02-28 ENCOUNTER — Encounter: Payer: Self-pay | Admitting: Radiation Oncology

## 2018-03-01 ENCOUNTER — Ambulatory Visit: Payer: Self-pay

## 2018-03-02 ENCOUNTER — Ambulatory Visit: Payer: Self-pay

## 2018-03-02 NOTE — Progress Notes (Signed)
  Radiation Oncology         [phone] ________________________________  Name: Roy Reed MRN: [id number]  Date: 02/28/2018  DOB: [birth year]  End of Treatment Note  Diagnosis:   62 y.o. 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     Indication for treatment:  Curative, Definitive Radiotherapy       Radiation treatment dates:   01/24/2018 to 02/28/2018    Site/Dose:  The patient received 45 Gy in 25 fractions of 1.8 Gy, to supplement an up-front prostate seed implant boost of 110 Gy to achieve a total nominal dose of 155 Gy.  Beams/energy:   The patient was treated with IMRT using volumetric arc therapy delivering 6 MV X-rays to clockwise and counterclockwise circumferential arcs with a 90 degree collimator offset to avoid dose scalloping.  Image guidance was performed with daily cone beam CT prior to each fraction to align to gold markers in the prostate and assure proper bladder and rectal fill volumes.  Immobilization was achieved with BodyFix custom mold.  Prostate Coverage - The dose of radiation delivered to the 90% or more of the prostate gland (D90) was 111.15% of the prescription dose. This exceeds our goal of greater than 90%. Rectal Sparing - The volume of rectal tissue receiving the prescription dose or higher was 0.0 cc. This falls under our thresholds tolerance of 1.0 cc.  Narrative: The patient tolerated radiation treatment relatively well.   The patient experienced modest fatigue and some moderate urinary irritation including, nocturia x12 despite taking Flomax. Reports urinary urgency, leakage, and weak intermittent stream. He also reports urine has a very strong small and he is not sure if it is from medication. Denies hematuria. Denies diarrhea.   Plan: The patient has completed radiation treatment. Based on symptoms, recommended patient to double his dose of Flomax, and to start on Advil to help with nocturia.  Patient was also sent for urinalysis culture and found to be negative for UTI. He was encouraged to speak with his PCP about current leg swelling and advised to elevate legs when possible to reduce swelling. He will return to radiation oncology clinic for routine followup in one month. I advised him to call or return sooner if he has any questions or concerns related to his recovery or treatment. ________________________________  Sheral Apley. Tammi Klippel, M.D.  This document serves as a record of services personally performed by Tyler Pita, MD. It was created on his behalf by Arlyce Harman, a trained medical scribe. The creation of this record is based on the scribe's personal observations and the provider's statements to them. This document has been checked and approved by the attending provider.

## 2018-03-03 ENCOUNTER — Ambulatory Visit: Payer: Self-pay

## 2018-03-17 ENCOUNTER — Telehealth: Payer: Self-pay | Admitting: Radiation Oncology

## 2018-03-17 ENCOUNTER — Other Ambulatory Visit: Payer: Self-pay | Admitting: Urology

## 2018-03-17 DIAGNOSIS — C61 Malignant neoplasm of prostate: Secondary | ICD-10-CM

## 2018-03-17 MED ORDER — TAMSULOSIN HCL 0.4 MG PO CAPS: 0.4000 mg | ORAL_CAPSULE | Freq: Two times a day (BID) | ORAL | 3 refills | Status: DC

## 2018-03-17 NOTE — Telephone Encounter (Signed)
-----   Message from Freeman Caldron, [place] sent at 03/17/2018  3:55 PM EDT ----- Regarding: FW: PHONE CALL Please let patient know that I sent a refill for his Flomax to [facility] on [address] [place]. [place] ----- Message ----- From: Kerri Perches Sent: 03/17/2018  10:30 AM To: Freeman Caldron, PA-C Subject: PHONE CALL                                     Good Morning Ashlyn,   Would you please call the above patient @ [phone].   Thanks,  [employer]

## 2018-03-17 NOTE — Telephone Encounter (Signed)
Received voicemail from patient's son requesting a return call. Phoned back. Spoke with [employer]. He reports his father side effects associated with radiation have increased in the last 3-4 days. He reports increased urinary frequency, dysuria, and urinary pressure with little output. Reports the increase in symptoms correlates with his father running out of Flomax. Johnathan reports that his father denies diarrhea or hematuria and is drinking plenty of fluid. Per EOT note patient was instructed to increase flomax to two tablets per day but not given a new script. Johnathan understands this RN will request a new script for Flomax and phone him back with further directions after consulting Ashlyn Bruning, PA-C.

## 2018-03-17 NOTE — Telephone Encounter (Signed)
As directed by Freeman Caldron, PA-C I  Phoned the patient's son back and informed him that a refill of his father's Flomax has been [facility] [address]. I requested Johnathan phone back on Monday if his father's symptoms hadn't improved by then. Johnathan verbalized understanding of all reviewed.

## 2018-03-24 ENCOUNTER — Ambulatory Visit: Payer: Self-pay

## 2018-03-31 ENCOUNTER — Ambulatory Visit
Admission: RE | Admit: 2018-03-31 | Discharge: 2018-03-31 | Disposition: A | Discharge status: Home / Self Care | Payer: Self-pay | Source: Ambulatory Visit | Attending: Urology | Admitting: Urology

## 2018-03-31 ENCOUNTER — Encounter: Payer: Self-pay | Admitting: Urology

## 2018-03-31 VITALS — BP 120/75 | HR 86 | Temp 98.4°F | Resp 20 | Ht 74.0 in | Wt 250.0 lb

## 2018-03-31 DIAGNOSIS — Z923 Personal history of irradiation: Secondary | ICD-10-CM | POA: Insufficient documentation

## 2018-03-31 DIAGNOSIS — C61 Malignant neoplasm of prostate: Secondary | ICD-10-CM | POA: Insufficient documentation

## 2018-03-31 NOTE — Progress Notes (Signed)
Radiation Oncology         [phone] ________________________________  Name: Roy Reed MRN: [id number]  Date: 03/31/2018  DOB: 1956/01/07  Post Treatment Note  CC: Roy Pier, MD  Roy Reed*  Diagnosis:   62 y.o. 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     Interval Since Last Radiation:  4 weeks  01/24/2018 to 02/28/2018:  The patient received 45 Gy in 25 fractions of 1.8 Gy, to supplement an up-front prostate seed implant boost of 110 Gy to achieve a total nominal dose of 155Gy.  01/02/18:  Insertion of radioactive I-125 seeds into the prostate gland; 110 Gy, boost therapy with placement of SpaceOAR gel.  Narrative:  The patient returns today for routine follow-up.  He tolerated radiation treatment relatively well.   He experienced modest fatigue and some moderate urinary irritation including, nocturia x12 despite taking Flomax. He reported urinary urgency, leakage, and weak intermittent stream but denied hematuria or diarrhea.   On review of systems, the patient states that he continues to see gradual improvement in his LUTS.  His current IPSS score is 22, indicating severe urinary symptoms with increased frequency, urgency, weak stream, intermittency and occasional feelings of incomplete emptying.  He denies dysuria and gross hematuria.  He has nocturia x4/night.  He continues taking Flomax BID which seems to help. He has mild-moderate fatigue associated with his ADT but overall is toelrating this treatment well.  ALLERGIES:  is allergic to aspirin.  Meds: Current Outpatient Medications  Medication Sig Dispense Refill  . atorvastatin (LIPITOR) 10 MG tablet Take 1 tablet (10 mg total) by mouth daily. 90 tablet 3  . tamsulosin (FLOMAX) 0.4 MG CAPS capsule Take 1 capsule (0.4 mg total) by mouth 2 (two) times daily with a meal. 60 capsule 3  . traMADol (ULTRAM) 50 MG tablet Take 1 tablet (50 mg total)  by mouth every 6 (six) hours as needed. (Patient not taking: Reported on 02/14/2018) 30 tablet 0   No current facility-administered medications for this encounter.     Physical Findings:  height is 6\' 2"  (1.88 m) and weight is 250 lb (113.4 kg). His oral temperature is 98.4 F (36.9 C). His blood pressure is 120/75 and his pulse is 86. His respiration is 20 and oxygen saturation is 100%.  Pain Assessment Pain Score: 0-No pain/10 In general this is a well appearing African male in no acute distress. He's alert and oriented x4 and appropriate throughout the examination. Cardiopulmonary assessment is negative for acute distress and he exhibits normal effort.   Lab Findings: Lab Results  Component Value Date   WBC 5.2 12/26/2017   HGB 12.9 (L) 12/26/2017   HCT 39.5 12/26/2017   MCV 82.8 12/26/2017   PLT 226 12/26/2017     Radiographic Findings: No results found.  Impression/Plan: 1. 62 y.o. 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   He will continue to follow up with urology for ongoing PSA determinations and has an appointment scheduled with Dr. Alyson Reed on 05/19/18. He anticipates completing a total of 2 years ADT.  He understands what to expect with regards to PSA monitoring going forward. I will look forward to following his response to treatment via correspondence with urology, and would be happy to continue to participate in his care if clinically indicated. I talked to the patient about what to expect in  the future, including his risk for erectile dysfunction and rectal bleeding. I encouraged him to call or return to the office if he has any questions regarding his previous radiation or possible radiation side effects. He was comfortable with this plan and will follow up as needed.    Roy Johns, PA-C

## 2018-04-07 ENCOUNTER — Encounter

## 2018-04-07 ENCOUNTER — Encounter: Payer: Self-pay | Admitting: Internal Medicine

## 2018-04-07 ENCOUNTER — Ambulatory Visit: Payer: Self-pay | Attending: Internal Medicine | Admitting: Internal Medicine

## 2018-04-07 VITALS — BP 124/77 | HR 89 | Temp 98.5°F | Resp 16 | Wt 247.2 lb

## 2018-04-07 DIAGNOSIS — Z886 Allergy status to analgesic agent status: Secondary | ICD-10-CM | POA: Insufficient documentation

## 2018-04-07 DIAGNOSIS — Z923 Personal history of irradiation: Secondary | ICD-10-CM | POA: Insufficient documentation

## 2018-04-07 DIAGNOSIS — Z79899 Other long term (current) drug therapy: Secondary | ICD-10-CM | POA: Insufficient documentation

## 2018-04-07 DIAGNOSIS — Z8546 Personal history of malignant neoplasm of prostate: Secondary | ICD-10-CM | POA: Insufficient documentation

## 2018-04-07 DIAGNOSIS — E785 Hyperlipidemia, unspecified: Secondary | ICD-10-CM | POA: Insufficient documentation

## 2018-04-07 DIAGNOSIS — E669 Obesity, unspecified: Secondary | ICD-10-CM | POA: Insufficient documentation

## 2018-04-07 DIAGNOSIS — Z6831 Body mass index (BMI) 31.0-31.9, adult: Secondary | ICD-10-CM | POA: Insufficient documentation

## 2018-04-07 DIAGNOSIS — R609 Edema, unspecified: Secondary | ICD-10-CM | POA: Insufficient documentation

## 2018-04-07 DIAGNOSIS — Z8711 Personal history of peptic ulcer disease: Secondary | ICD-10-CM | POA: Insufficient documentation

## 2018-04-07 MED FILL — ?ATORVASTATIN 10 MG TABLET: 10 | 30 days supply | Qty: 30 | Fill #4

## 2018-04-07 NOTE — Patient Instructions (Signed)

## 2018-04-07 NOTE — Progress Notes (Signed)
Patient ID: [name], male    DOB: 1955/09/15  MRN: [id number]  CC: Follow-up (4 month)   Subjective: Roy Reed is a 62 y.o. male who presents for chronic disease management. His concerns today include:  Pt with hx of pre-DM, rotator cuff tear s/p RT shoulder arthroscopy and prostate CA (hormone and radiation seeds).  Prostate CA: had XRT seed implant with ext XRT therapy for 5 wks  HL: On last visit lipid profile revealed total cholesterol of 276 and LDL of 198.  Patient started on Lipitor which he has been taking consistently.  He had mild elevation in ALT at the time of 53.  Since then he has had CMP done by his urologist in April that revealed an ALT of 76/AST of 48.  Concern about 15 lb wgh gain since 07/2017.  Feels he has to eat larger portions of food when he takes Flomax.  Flomax is written as twice daily dosing to take with food.  He is afraid that if he does not eat larger portions that he would have a flareup of acid reflux.  However he has not been bothered with acid reflux for a while. Walks 3 x wk for 40-60 mins Patient Active Problem List   Diagnosis Date Noted  . Malignant neoplasm of prostate ([place]) 10/13/2017  . Rotator cuff tear arthropathy of right shoulder 08/31/2017  . Follow up 08/31/2017  . Personal history of gastric ulcer 02/09/2017     Current Outpatient Medications on File Prior to Visit  Medication Sig Dispense Refill  . atorvastatin (LIPITOR) 10 MG tablet Take 1 tablet (10 mg total) by mouth daily. 90 tablet 3  . tamsulosin (FLOMAX) 0.4 MG CAPS capsule Take 1 capsule (0.4 mg total) by mouth 2 (two) times daily with a meal. 60 capsule 3  . traMADol (ULTRAM) 50 MG tablet Take 1 tablet (50 mg total) by mouth every 6 (six) hours as needed. (Patient not taking: Reported on 02/14/2018) 30 tablet 0   No current facility-administered medications on file prior to visit.     Allergies  Allergen Reactions  . Aspirin     States he can take if he  takes acid reducer medicine first    Social History   Socioeconomic History  . Marital status: Married    Spouse name: Not on file  . Number of children: Not on file  . Years of education: Not on file  . Highest education level: Not on file  Occupational History  . Not on file  Social Needs  . Financial resource strain: Not on file  . Food insecurity:    Worry: Not on file    Inability: Not on file  . Transportation needs:    Medical: Not on file    Non-medical: Not on file  Tobacco Use  . Smoking status: Never Smoker  . Smokeless tobacco: Never Used  Substance and Sexual Activity  . Alcohol use: Not Currently  . Drug use: No  . Sexual activity: Not on file  Lifestyle  . Physical activity:    Days per week: Not on file    Minutes per session: Not on file  . Stress: Not on file  Relationships  . Social connections:    Talks on phone: Not on file    Gets together: Not on file    Attends religious service: Not on file    Active member of club or organization: Not on file    Attends meetings of clubs  or organizations: Not on file    Relationship status: Not on file  . Intimate partner violence:    Fear of current or ex partner: Not on file    Emotionally abused: Not on file    Physically abused: Not on file    Forced sexual activity: Not on file  Other Topics Concern  . Not on file  Social History Narrative  . Not on file    No family history on file.  Past Surgical History:  Procedure Laterality Date  . COLONOSCOPY    . PROSTATE BIOPSY    . PROSTATE BIOPSY    . RADIOACTIVE SEED IMPLANT N/A 01/02/2018   Procedure: RADIOACTIVE SEED IMPLANT/BRACHYTHERAPY IMPLANT;  Surgeon: Cleon Gustin, MD;  Location: [facility];  Service: Urology;  Laterality: N/A;  . SHOULDER ARTHROSCOPY WITH ROTATOR CUFF REPAIR AND SUBACROMIAL DECOMPRESSION Right 08/05/2017   Procedure: RIGHT SHOULDER ARTHROSCOPY WITH ROTATOR CUFF REPAIR, DISTAL CLAVICLE EXCISION,  DEBRIDEMENT AND SUBACROMIAL DECOMPRESSION;  Surgeon: Leandrew Koyanagi, MD;  Location: [facility];  Service: Orthopedics;  Laterality: Right;  . SPACE OAR INSTILLATION N/A 01/02/2018   Procedure: SPACE OAR INSTILLATION;  Surgeon: Cleon Gustin, MD;  Location: [facility];  Service: Urology;  Laterality: N/A;    ROS: Review of Systems General: Reports some swelling in the lower extremities when he sits at his desk for long periods.  PHYSICAL EXAM: BP 124/77   Pulse 89   Temp 98.5 F (36.9 C) (Oral)   Resp 16   Wt 247 lb 3.2 oz (112.1 kg)   SpO2 96%   BMI 31.74 kg/m   Wt Readings from Last 3 Encounters:  04/07/18 247 lb 3.2 oz (112.1 kg)  03/31/18 250 lb (113.4 kg)  01/02/18 241 lb (109.3 kg)    Physical Exam  General appearance - alert, well appearing, older African-American lady in NAD Mental status - normal mood, behavior, speech, dress, motor activity, and thought processes Chest - clear to auscultation, no wheezes, rales or rhonchi, symmetric air entry Heart - normal rate, regular rhythm, normal S1, S2, no murmurs, rubs, clicks or gallops Extremities -no lower extremity edema at this time.  ASSESSMENT AND PLAN: 1. Hyperlipidemia, unspecified hyperlipidemia type Refill given on Lipitor.  However we will recheck LFTs today to make sure his AST and ALT have not increased further.  Patient advised that we can see mild elevation while on statin. - Lipid panel - Hepatic Function Panel  2. Dependent edema Recommend use of compression socks when he plans to sit for long periods of time or try to keep the leg elevated while sitting.  3. Obesity (BMI 30-39.9) Discussed healthy eating habits and regular exercise.  Advised patient that he does not have to eat at larger portions when he takes the Flomax.  We discussed portion sizes, cutting back on white carbohydrates and cutting out sugary drinks.   Patient was given the opportunity to ask  questions.  Patient verbalized understanding of the plan and was able to repeat key elements of the plan.   Orders Placed This Encounter  Procedures  . Lipid panel  . Hepatic Function Panel     Requested Prescriptions    No prescriptions requested or ordered in this encounter    Return in about 4 months (around 08/07/2018).  Karle Plumber, MD, FACP

## 2018-04-08 LAB — HEPATIC FUNCTION PANEL
ALK PHOS: 93 IU/L (ref 39–117)
ALT: 52 IU/L — ABNORMAL HIGH (ref 0–44)
AST: 24 IU/L (ref 0–40)
Albumin: 4.3 g/dL (ref 3.6–4.8)
BILIRUBIN TOTAL: 0.2 mg/dL (ref 0.0–1.2)
BILIRUBIN, DIRECT: 0.09 mg/dL (ref 0.00–0.40)
Total Protein: 7.8 g/dL (ref 6.0–8.5)

## 2018-04-08 LAB — LIPID PANEL
CHOL/HDL RATIO: 3.6 ratio (ref 0.0–5.0)
Cholesterol, Total: 204 mg/dL — ABNORMAL HIGH (ref 100–199)
HDL: 57 mg/dL (ref >39)
LDL Calculated: 132 mg/dL — ABNORMAL HIGH (ref 0–99)
Triglycerides: 75 mg/dL (ref 0–149)
VLDL Cholesterol Cal: 15 mg/dL (ref 5–40)

## 2018-04-18 MED FILL — ?TAMSULOSIN HCL 0.4 MG CAP: 0.4 | 30 days supply | Qty: 60 | Fill #0

## 2018-05-19 MED FILL — ?ATORVASTATIN 10 MG TABLET: 10 | 30 days supply | Qty: 30 | Fill #5

## 2018-05-19 MED FILL — ?TAMSULOSIN HCL 0.4 MG CAP: 0.4 | 30 days supply | Qty: 60 | Fill #1

## 2018-05-30 ENCOUNTER — Ambulatory Visit: Payer: Self-pay | Attending: Internal Medicine

## 2018-05-30 DIAGNOSIS — E785 Hyperlipidemia, unspecified: Secondary | ICD-10-CM | POA: Insufficient documentation

## 2018-05-30 NOTE — Progress Notes (Signed)
Patient here for lab visit only 

## 2018-06-02 ENCOUNTER — Ambulatory Visit: Payer: Self-pay | Attending: Family Medicine

## 2018-06-05 IMAGING — CT CT ABD-PEL WO/W CM
2 of 9 series · 12 of 46 positions shown, 18 images · IV contrast (iopamidol)
Comparison: None.

CLINICAL DATA: New diagnosis of prostate cancer.

EXAM:
CT ABDOMEN AND PELVIS WITHOUT AND WITH CONTRAST
TECHNIQUE: Multidetector CT imaging of the abdomen and pelvis was performed
following the standard protocol before and following the bolus
administration of intravenous contrast.
CONTRAST:  100mL Q2RFQQ-LFF IOPAMIDOL (Q2RFQQ-LFF) INJECTION 61%

[Series 3: coronal st · coronal · 0.91mm/px · 3 of 104 slices shown]
[im 26/104  soft-tissue]
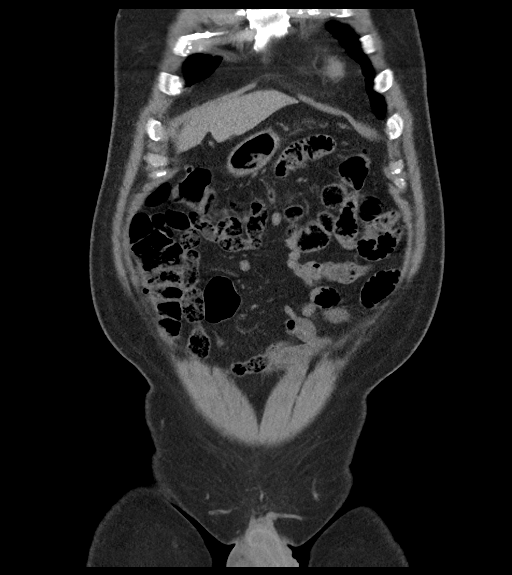
[im 52/104  soft-tissue]
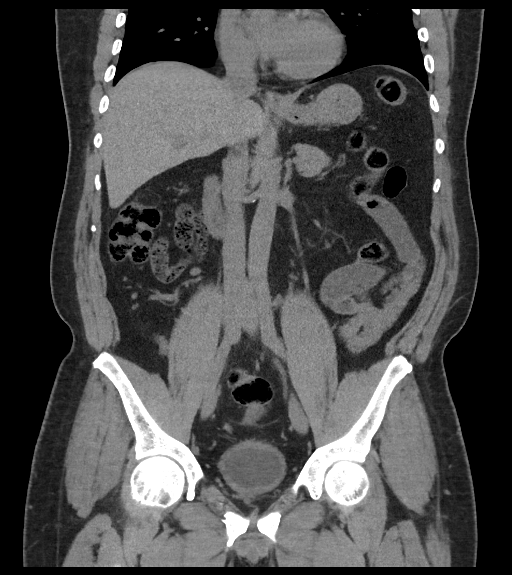
[im 78/104  soft-tissue]
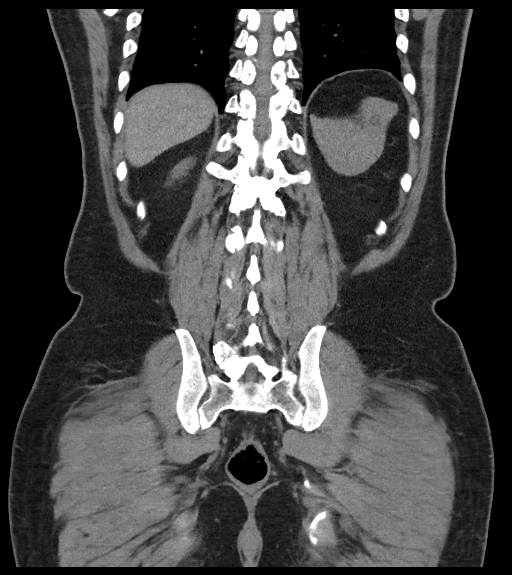

[Series 8: axial st · axial · 0.81mm/px · z∈[+828,+1228]mm · 9 of 102 slices shown, 15 images]
[im 11/102  soft-tissue]
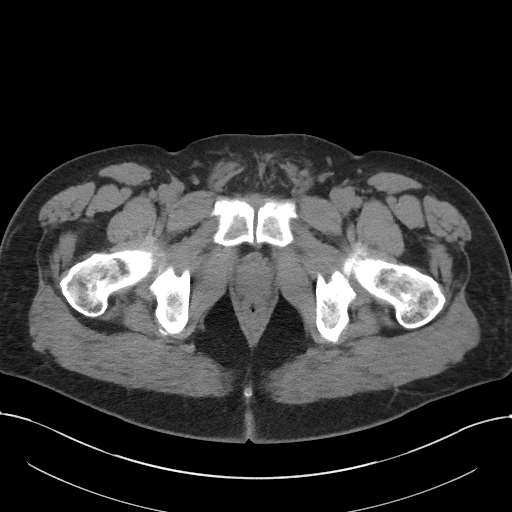
[im 11/102  bone]
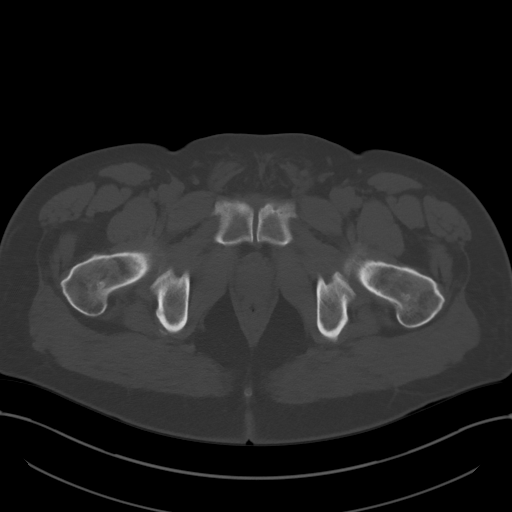
[im 21/102  soft-tissue]
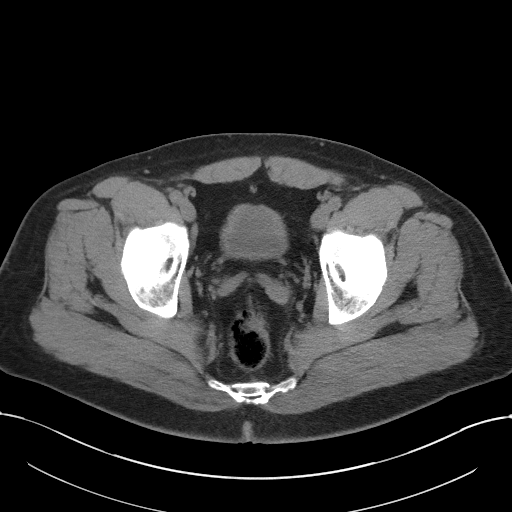
[im 31/102  soft-tissue]
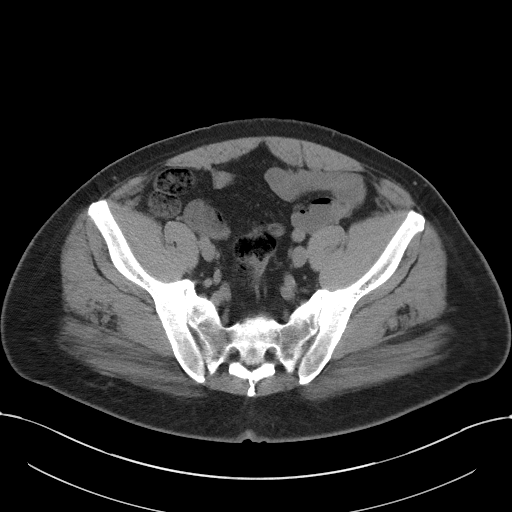
[im 41/102  soft-tissue]
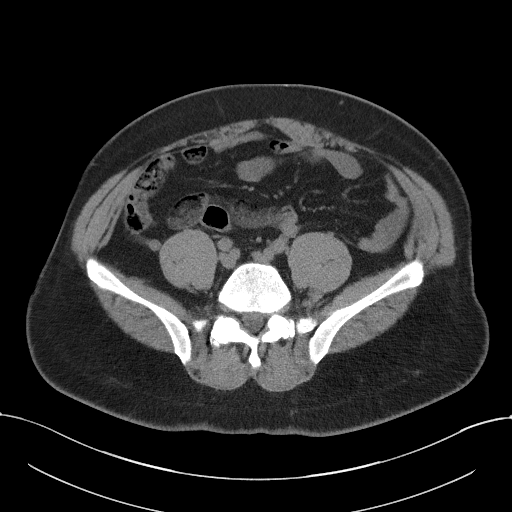
[im 51/102  soft-tissue]
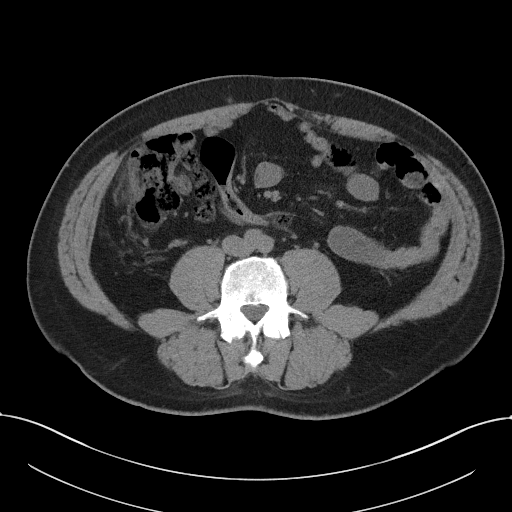
[im 61/102  soft-tissue]
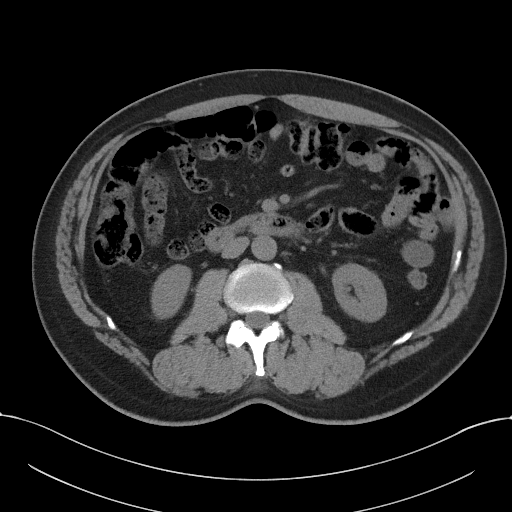
[im 61/102  lung]
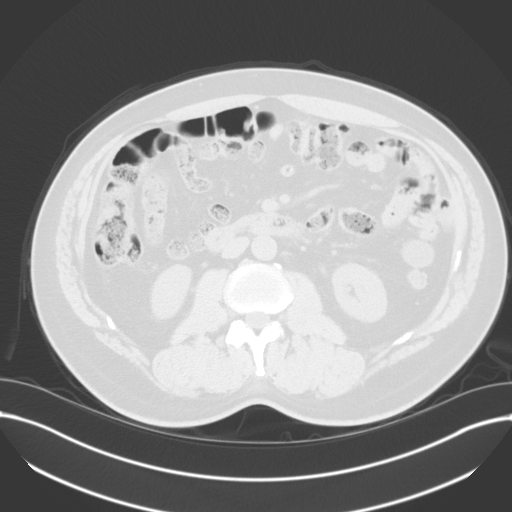
[im 71/102  soft-tissue]
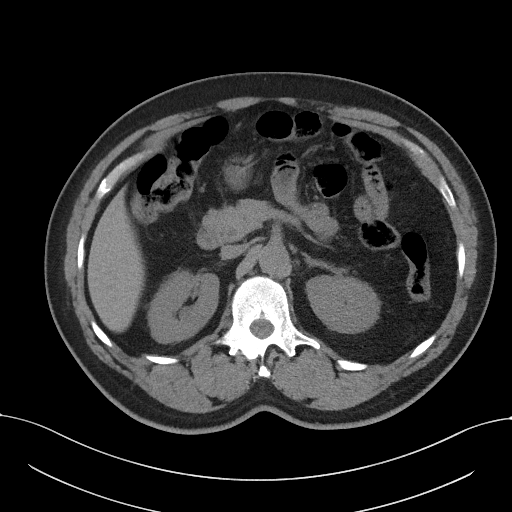
[im 71/102  lung]
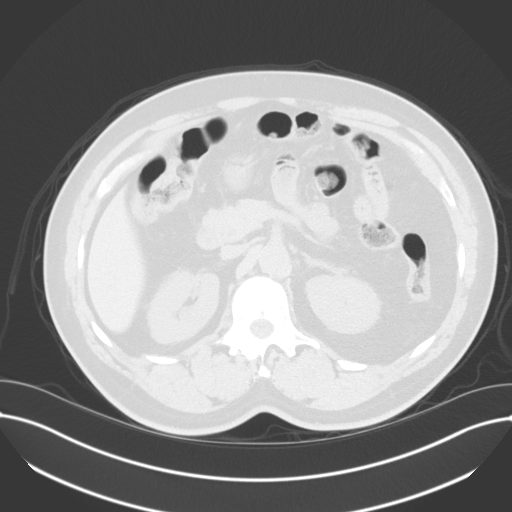
[im 81/102  soft-tissue]
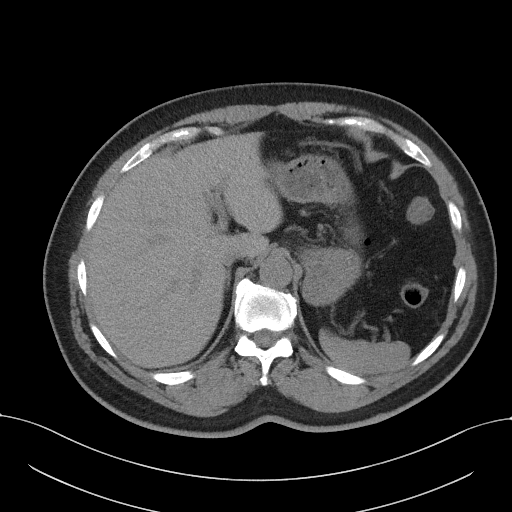
[im 81/102  lung]
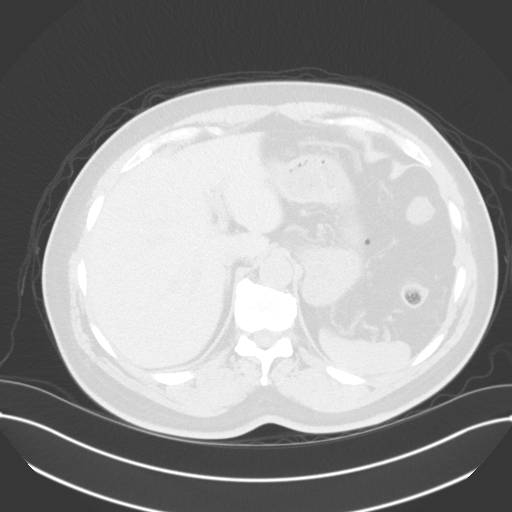
[im 91/102  soft-tissue]
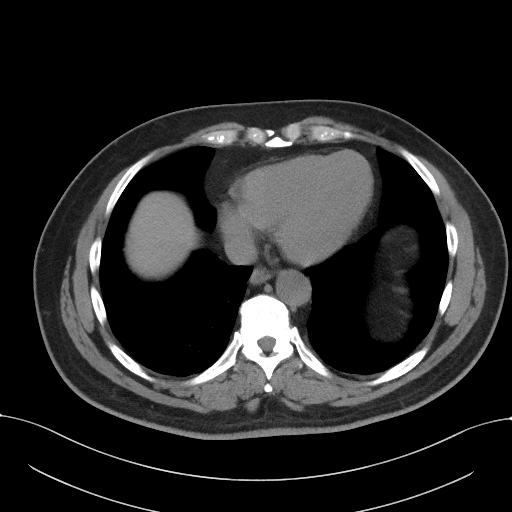
[im 91/102  lung]
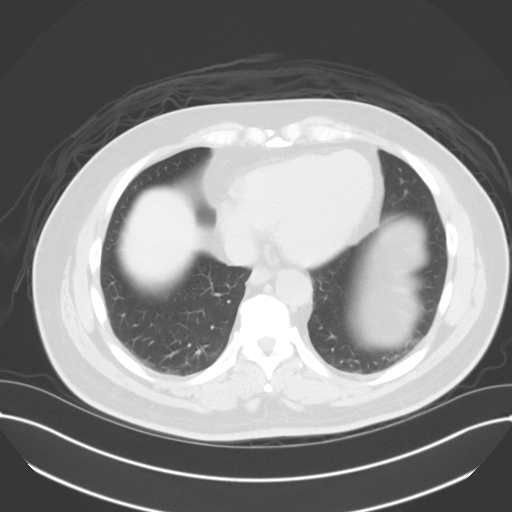
[im 91/102  bone]
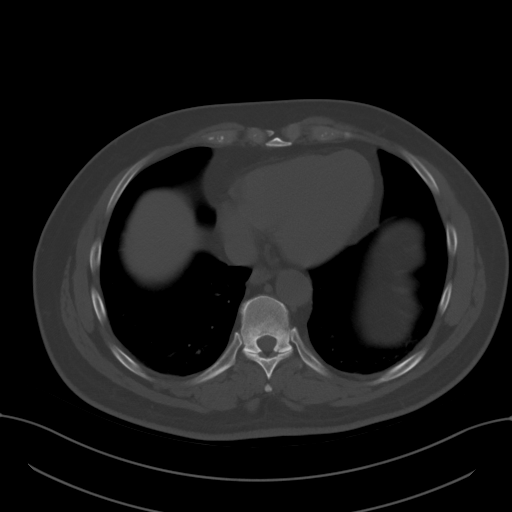

[12 of 46 positions shown; findings below may reference images not displayed]

FINDINGS: Lower chest: Sub 4 mm pulmonary nodule in the right lower lobe on
image number 2 is likely benign. No other pulmonary nodules or acute
pulmonary findings. No pleural effusion. The heart is normal in
size. The distal esophagus is grossly normal.

Hepatobiliary: No focal hepatic lesions or intrahepatic biliary
dilatation. The gallbladder is normal. No common bile duct
dilatation.

Pancreas: No mass, inflammation or ductal dilatation.

Spleen: Normal size.  No focal lesions.

Adrenals/Urinary Tract: The adrenal glands and kidneys are
unremarkable. The bladder demonstrates uniform wall thickening
possibly due to partial bladder outlet obstruction and lack of
distension.

Stomach/Bowel: The stomach, duodenum, small bowel and colon are
grossly normal without oral contrast. No inflammatory changes, mass
lesions or obstructive findings. The terminal ileum and appendix are
normal.

Vascular/Lymphatic: The aorta is normal in caliber. No dissection.
The branch vessels are patent. The major venous structures are
patent. No mesenteric or retroperitoneal mass or adenopathy. Small
scattered lymph nodes are noted.

Reproductive: The prostate gland and seminal vesicles are
unremarkable.

Other: No pelvic mass or adenopathy. Small scattered lymph nodes. No
free pelvic fluid collections. No inguinal mass or adenopathy. No
abdominal wall hernia or subcutaneous lesions.

Musculoskeletal: No sclerotic bone lesions to suggest metastatic
disease. Lower lumbar facet disease and bilateral SI joint
degenerative changes are noted.
IMPRESSION: 1. No CT findings to suggest metastatic prostate cancer in the
abdomen/pelvis. No adenopathy.
2. No worrisome bone lesions.
3. Sub 4 mm right lower lobe pulmonary nodule, likely benign.
Attention on future studies is suggested.
4. Mild uniform bladder wall thickening likely due to partial
bladder outlet obstruction and lack of distension.

## 2018-06-07 ENCOUNTER — Ambulatory Visit (INDEPENDENT_AMBULATORY_CARE_PROVIDER_SITE_OTHER): Payer: Self-pay | Admitting: Urology

## 2018-06-07 DIAGNOSIS — R351 Nocturia: Secondary | ICD-10-CM

## 2018-06-07 DIAGNOSIS — C61 Malignant neoplasm of prostate: Secondary | ICD-10-CM

## 2018-06-07 MED FILL — ALFUZOSIN HCL ER 10 MG TAB: 10 | 30 days supply | Qty: 30 | Fill #0

## 2018-06-07 MED FILL — MEGESTROL 20 MG TABLET: 20 | 30 days supply | Qty: 60 | Fill #0

## 2018-06-21 MED FILL — ?ATORVASTATIN 10 MG TABLET: 10 | 30 days supply | Qty: 30 | Fill #6

## 2018-06-21 MED FILL — ?TAMSULOSIN HCL 0.4 MG CAP: 0.4 | 30 days supply | Qty: 60 | Fill #2

## 2018-06-28 ENCOUNTER — Ambulatory Visit: Payer: Self-pay | Attending: Family Medicine | Admitting: Physician Assistant

## 2018-06-28 VITALS — BP 119/76 | HR 77 | Temp 98.1°F | Resp 16 | Wt 262.4 lb

## 2018-06-28 DIAGNOSIS — S025XXA Fracture of tooth (traumatic), initial encounter for closed fracture: Secondary | ICD-10-CM | POA: Insufficient documentation

## 2018-06-28 DIAGNOSIS — Z8546 Personal history of malignant neoplasm of prostate: Secondary | ICD-10-CM | POA: Insufficient documentation

## 2018-06-28 DIAGNOSIS — E785 Hyperlipidemia, unspecified: Secondary | ICD-10-CM | POA: Insufficient documentation

## 2018-06-28 DIAGNOSIS — X58XXXA Exposure to other specified factors, initial encounter: Secondary | ICD-10-CM | POA: Insufficient documentation

## 2018-06-28 DIAGNOSIS — R7303 Prediabetes: Secondary | ICD-10-CM | POA: Insufficient documentation

## 2018-06-28 DIAGNOSIS — Z886 Allergy status to analgesic agent status: Secondary | ICD-10-CM | POA: Insufficient documentation

## 2018-06-28 DIAGNOSIS — Z79899 Other long term (current) drug therapy: Secondary | ICD-10-CM | POA: Insufficient documentation

## 2018-06-28 NOTE — Progress Notes (Signed)
Patient ID: Roy Reed, male   DOB: 1956-06-10, 62 y.o.   MRN: [id number]    Roy Reed, is a 62 y.o. male  OZD:[id number]  [id number]:[id number]  DOB - [birth year]  Subjective:  Chief Complaint and HPI: Roy Reed is a 62 y.o. male here today for  Tooth pain on and off for about 1 month.  No fever.  Worse when hot or cold touches it.     ROS:   Constitutional:  No f/c, No night sweats, No unexplained weight loss. EENT:  No vision changes, No blurry vision, No hearing changes. No other mouth, throat, or ear problems.  Respiratory: No cough, No SOB Cardiac: No CP, no palpitations GI:  No abd pain, No N/V/D. GU: No Urinary s/sx Musculoskeletal: No joint pain Neuro: No headache, no dizziness, no motor weakness.  Skin: No rash Endocrine:  No polydipsia. No polyuria.  Psych: Denies SI/HI  No problems updated.  ALLERGIES: Allergies  Allergen Reactions  . Aspirin     States he can take if he takes acid reducer medicine first    PAST MEDICAL HISTORY: Past Medical History:  Diagnosis Date  . Elevated PSA   . Left knee pain   . Low back pain   . Pre-diabetes   . Prostate cancer ([place])   . Rotator cuff disorder, right     MEDICATIONS AT HOME: Prior to Admission medications   Medication Sig Start Date End Date Taking? Authorizing Provider  atorvastatin (LIPITOR) 10 MG tablet Take 1 tablet (10 mg total) by mouth daily. 11/05/17   Ladell Pier, MD  tamsulosin (FLOMAX) 0.4 MG CAPS capsule Take 1 capsule (0.4 mg total) by mouth 2 (two) times daily with a meal. 03/17/18   Bruning, Ashlyn, PA-C  traMADol (ULTRAM) 50 MG tablet Take 1 tablet (50 mg total) by mouth every 6 (six) hours as needed. Patient not taking: Reported on 02/14/2018 01/02/18 01/02/19  Cleon Gustin, MD     Objective:  EXAM:   Vitals:   06/28/18 1442  BP: 119/76  Pulse: 77  Resp: 16  Temp: 98.1 F (36.7 C)  TempSrc: Oral  SpO2: 100%  Weight: 262 lb 6.4 oz (119 kg)    General  appearance : A&OX3. NAD. Non-toxic-appearing HEENT: Atraumatic and Normocephalic.  PERRLA. EOM intact.  Mouth-MMM, poor dentition generally.  Tooth #14 with fracture.  No abscess.  post pharynx WNL w/o erythema, No PND. Neck: supple, no JVD. No cervical lymphadenopathy. No thyromegaly Chest/Lungs:  Breathing-non-labored, Good air entry bilaterally, breath sounds normal without rales, rhonchi, or wheezing  CVS: S1 S2 regular, no murmurs, gallops, rubs  Extremities: Bilateral Lower Ext shows no edema, both legs are warm to touch with = pulse throughout Neurology:  CN II-XII grossly intact, Non focal.   Psych:  TP linear. J/I WNL. Normal speech. Appropriate eye contact and affect.  Skin:  No Rash  Data Review Lab Results  Component Value Date   HGBA1C 5.9 08/31/2017   HGBA1C 5.8 02/28/2017     Assessment & Plan   1. Closed fracture of tooth, initial encounter advil or tylenol, salt water gargles.  No sign of abscess.   - Ambulatory referral to Dentistry   Patient have been counseled extensively about nutrition and exercise  Return in about 3 months (around 09/28/2018) for Dr Wynetta Emery; hyperlipidemia.  The patient was given clear instructions to go to ER or return to medical center if symptoms don't improve, worsen or new problems develop. The patient verbalized understanding. The  patient was told to call to get lab results if they haven't heard anything in the next week.     Freeman Caldron, PA-C [facility], [place]   06/28/2018, 2:52 PM

## 2018-06-28 NOTE — Patient Instructions (Signed)
Dental Pain Dental pain may be caused by many things, including:  Tooth decay (cavities or caries). Cavities cause the nerve of your tooth to be open to air and hot or cold temperatures. This can cause pain or discomfort.  Abscess or infection. A dental abscess is an area that is full of infected pus from a bacterial infection in the inner part of the tooth (pulp). It usually happens at the end of the tooth's root.  Injury.  An unknown reason (idiopathic).  Your pain may be mild or severe. It may only happen when:  You are chewing.  You are exposed to hot or cold temperature.  You are eating or drinking sugary foods or beverages, such as: ? Soda. ? Candy.  Your pain may also be there all of the time. Follow these instructions at home: Watch your dental pain for any changes. Do these things to lessen your discomfort:  Take medicines only as told by your dentist.  If your dentist tells you to take an antibiotic medicine, finish all of it even if you start to feel better.  Keep all follow-up visits as told by your dentist. This is important.  Do not apply heat to the outside of your face.  Rinse your mouth or gargle with salt water if told by your dentist. This helps with pain and swelling. ? You can make salt water by adding  tsp of salt to 1 cup of warm water.  Apply ice to the painful area of your face: ? Put ice in a plastic bag. ? Place a towel between your skin and the bag. ? Leave the ice on for 20 minutes, 2-3 times per day.  Avoid foods or drinks that cause you pain, such as: ? Very hot or very cold foods or drinks. ? Sweet or sugary foods or drinks.  Contact a doctor if:  Your pain is not helped with medicines.  Your symptoms are worse.  You have new symptoms. Get help right away if:  You cannot open your mouth.  You are having trouble breathing or swallowing.  You have a fever.  Your face, neck, or jaw is puffy (swollen). This information is not  intended to replace advice given to you by your health care provider. Make sure you discuss any questions you have with your health care provider. Document Released: 02/09/2008 Document Revised: 01/29/2016 Document Reviewed: 08/19/2014 Elsevier Interactive Patient Education  2018 Elsevier Inc.  

## 2018-07-24 IMAGING — DX DG CHEST 2V
2 series · 2 of 2 positions shown · non-contrast
Comparison: None.

CLINICAL DATA: Preoperative respiratory examination prior to
prostate procedure.

EXAM:
CHEST - 2 VIEW

[chest pa]
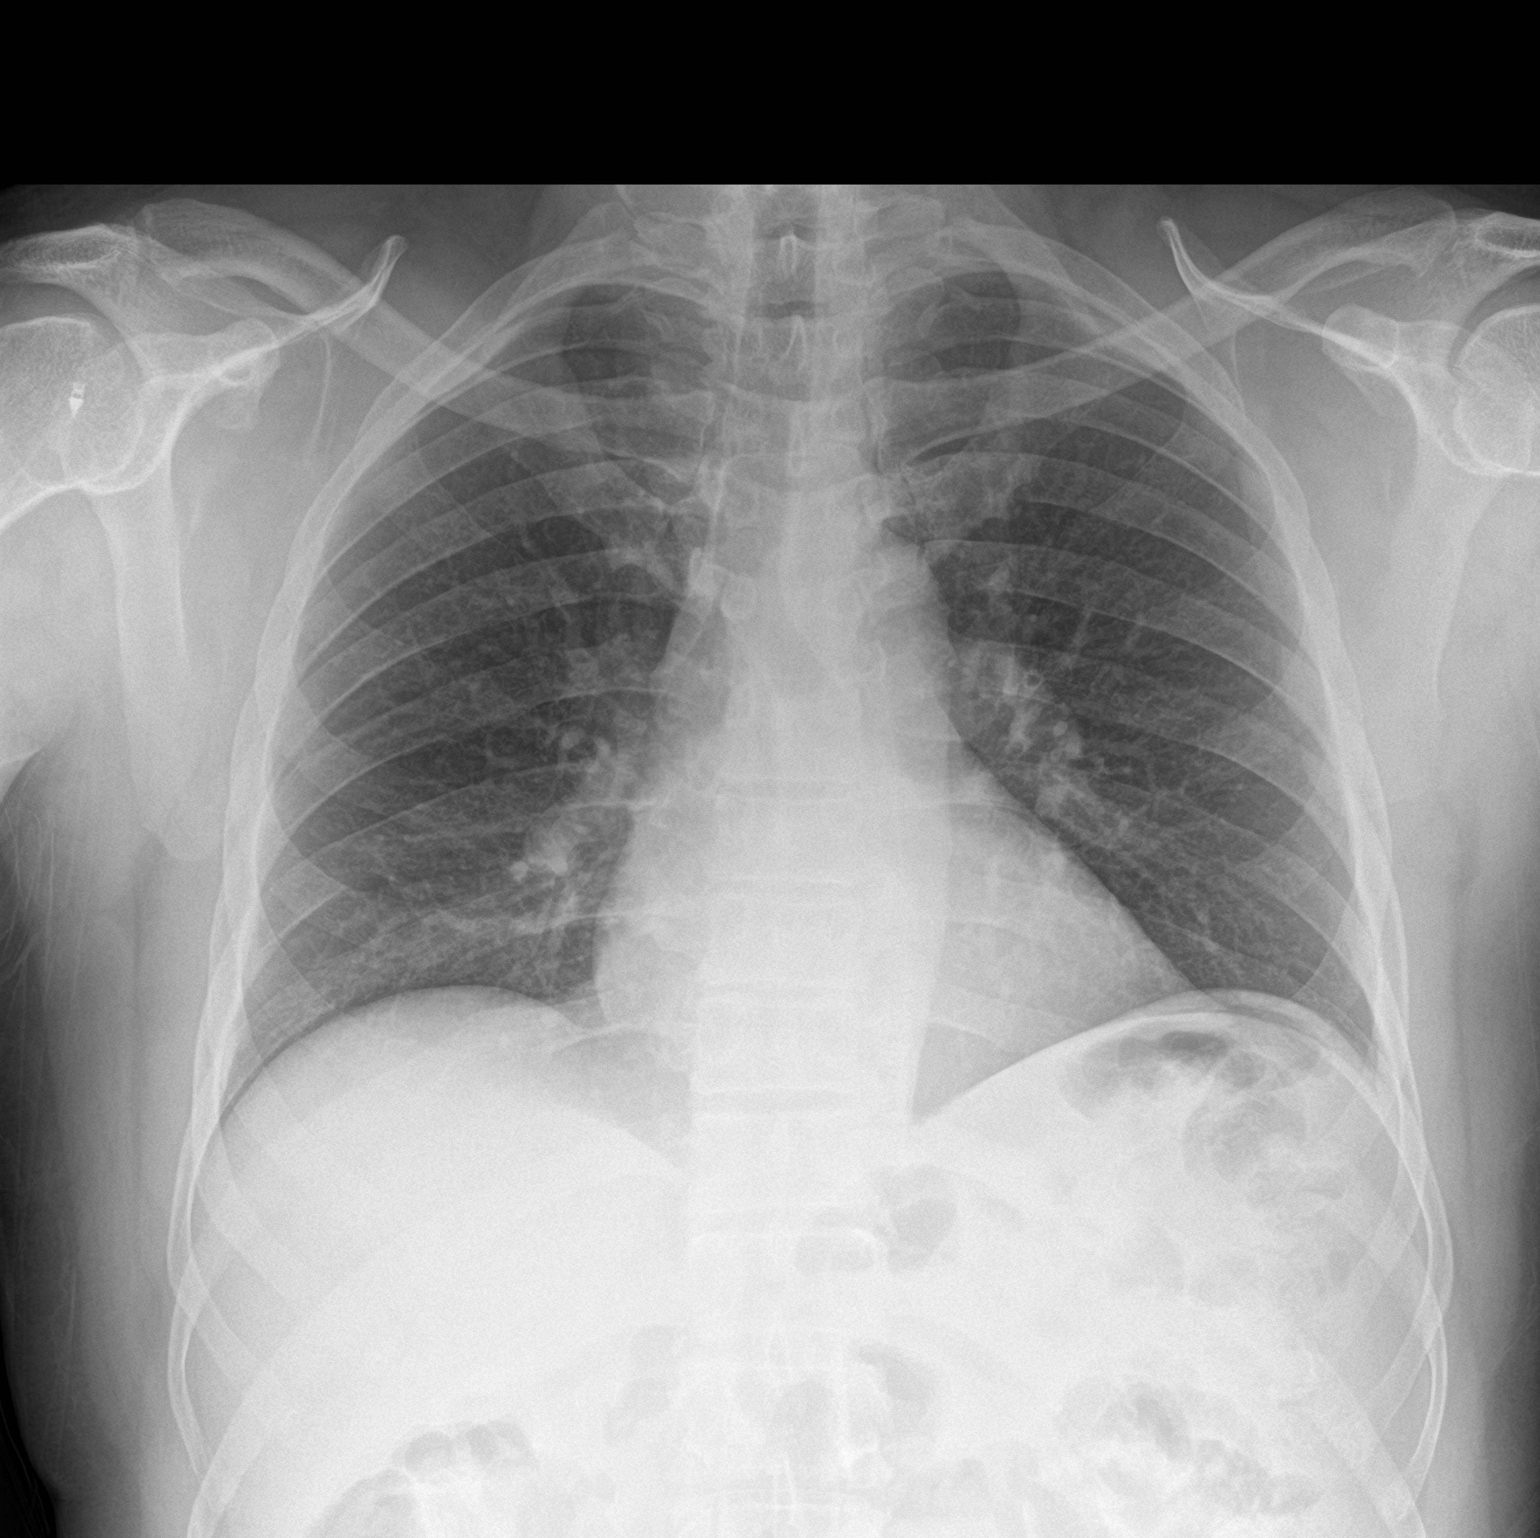

[chest lat]
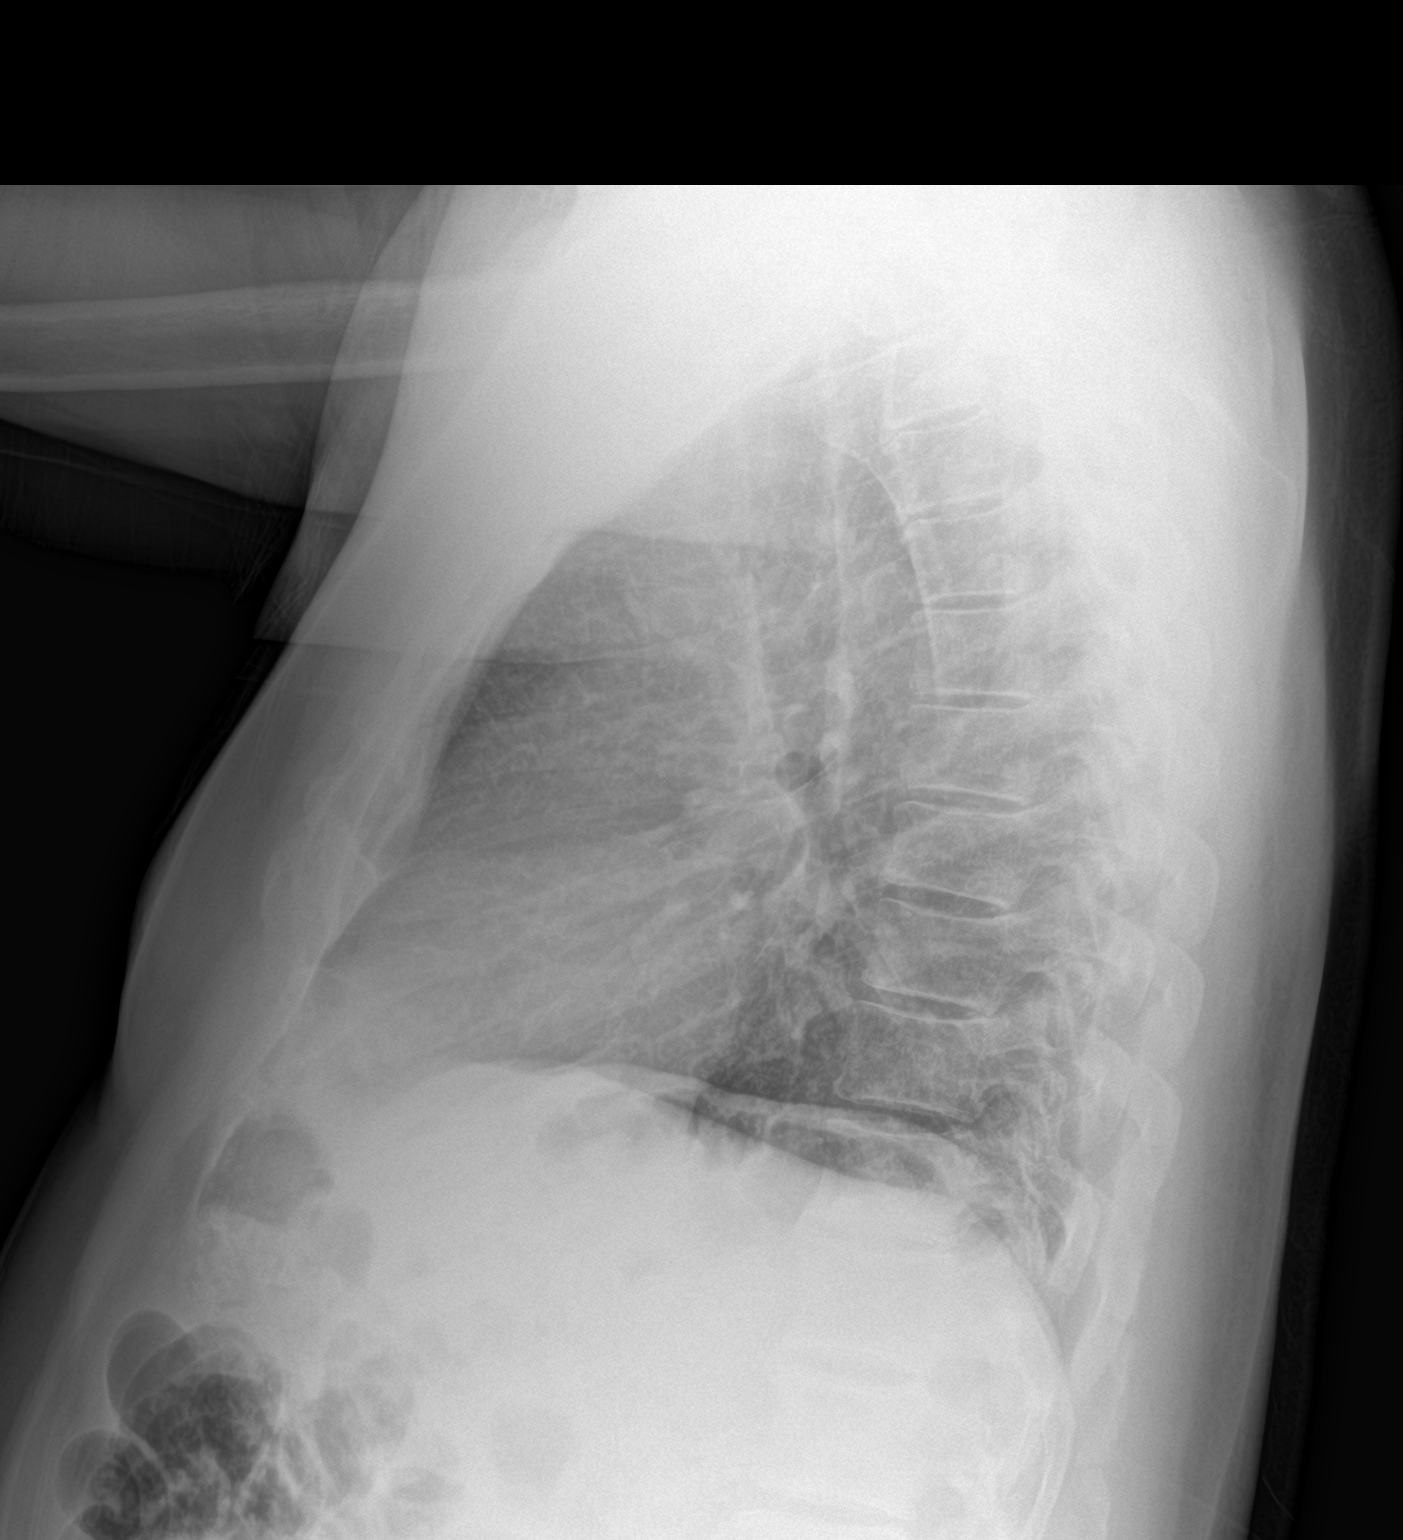

[2 of 2 positions shown; findings below may reference images not displayed]

FINDINGS: The cardiomediastinal silhouette is unremarkable.

There is no evidence of focal airspace disease, pulmonary edema,
suspicious pulmonary nodule/mass, pleural effusion, or pneumothorax.

No acute bony abnormalities are identified.
IMPRESSION: No active cardiopulmonary disease.

## 2018-07-26 MED FILL — ?ATORVASTATIN 10 MG TABLET: 10 | 30 days supply | Qty: 30 | Fill #7

## 2018-07-28 ENCOUNTER — Other Ambulatory Visit: Payer: Self-pay | Admitting: Urology

## 2018-07-28 MED FILL — TAMSULOSIN HCL 0.4 MG CAP: 0.4 | 30 days supply | Qty: 60 | Fill #0

## 2018-08-01 ENCOUNTER — Ambulatory Visit (INDEPENDENT_AMBULATORY_CARE_PROVIDER_SITE_OTHER): Payer: Self-pay

## 2018-08-01 ENCOUNTER — Ambulatory Visit (INDEPENDENT_AMBULATORY_CARE_PROVIDER_SITE_OTHER): Payer: Self-pay | Admitting: Orthopaedic Surgery

## 2018-08-01 DIAGNOSIS — M25512 Pain in left shoulder: Secondary | ICD-10-CM

## 2018-08-01 MED ORDER — METHYLPREDNISOLONE ACETATE 40 MG/ML IJ SUSP
40.0000 mg | INTRAMUSCULAR | Status: AC | PRN
  Administered 2018-08-01: 40 mg via INTRA_ARTICULAR

## 2018-08-01 MED ORDER — BUPIVACAINE HCL 0.5 % IJ SOLN
3.0000 mL | INTRAMUSCULAR | Status: AC | PRN
  Administered 2018-08-01: 3 mL via INTRA_ARTICULAR

## 2018-08-01 MED ORDER — LIDOCAINE HCL 1 % IJ SOLN
3.0000 mL | INTRAMUSCULAR | Status: AC | PRN
  Administered 2018-08-01: 3 mL

## 2018-08-01 NOTE — Progress Notes (Signed)
Office Visit Note   Patient: Roy Reed           Date of Birth: 25-Feb-1956           MRN: [id number] Visit Date: 08/01/2018              Requested by: Ladell Pier, MD [address] [place], [place] [address] PCP: Ladell Pier, MD   Assessment & Plan: Visit Diagnoses:  1. Left shoulder pain, unspecified chronicity     Plan: Impression is left shoulder impingement with severe downward sloping left acromion.  These x-ray findings were discussed with the patient.  Subacromial injection was performed today.  Patient instructed to notify us if he does not receive any pain relief from the injection today.  Follow-Up Instructions: Return if symptoms worsen or fail to improve.   Orders:  Orders Placed This Encounter  Procedures  . XR Shoulder Left   No orders of the defined types were placed in this encounter.     Procedures: Large Joint Inj: L subacromial bursa on 08/01/2018 5:37 PM Indications: pain Details: 22 G needle  Arthrogram: No  Medications: 3 mL lidocaine 1 %; 3 mL bupivacaine 0.5 %; 40 mg methylPREDNISolone acetate 40 MG/ML Outcome: tolerated well, no immediate complications Patient was prepped and draped in the usual sterile fashion.       Clinical Data: No additional findings.   Subjective: Chief Complaint  Patient presents with  . Left Shoulder - Pain    Patient is a 62 year old gentleman who is status post right shoulder arthroscopy with rotator cuff repair from a year ago who comes in today with a new complaint of left shoulder pain for several weeks.  He states the pain is localized to the deltoid region.  Denies any injuries.  Denies any radicular symptoms.  He has taken over-the-counter pain meds without any significant relief.  The pain is worse with elevation of the arm to the level of the shoulder.   Review of Systems  Constitutional: Negative.   All other systems reviewed and are negative.    Objective: Vital Signs:  There were no vitals taken for this visit.  Physical Exam  Constitutional: He is oriented to person, place, and time. He appears well-developed and well-nourished.  Pulmonary/Chest: Effort normal.  Abdominal: Soft.  Neurological: He is alert and oriented to person, place, and time.  Skin: Skin is warm.  Psychiatric: He has a normal mood and affect. His behavior is normal. Judgment and thought content normal.  Nursing note and vitals reviewed.   Ortho Exam Left shoulder exam shows very positive Hawkins and Neer impingement.  Mildly positive cross adduction.  Rotator cuff testing is strong and without pain. Specialty Comments:  No specialty comments available.  Imaging: Xr Shoulder Left  Result Date: 08/01/2018 Significant downward sloping acromion.  No structural abnormalities.    PMFS History: Patient Active Problem List   Diagnosis Date Noted  . Malignant neoplasm of prostate ([place]) 10/13/2017  . Rotator cuff tear arthropathy of right shoulder 08/31/2017  . Follow up 08/31/2017  . Personal history of gastric ulcer 02/09/2017   Past Medical History:  Diagnosis Date  . Elevated PSA   . Left knee pain   . Low back pain   . Pre-diabetes   . Prostate cancer ([place])   . Rotator cuff disorder, right     No family history on file.  Past Surgical History:  Procedure Laterality Date  . COLONOSCOPY    .  PROSTATE BIOPSY    . PROSTATE BIOPSY    . RADIOACTIVE SEED IMPLANT N/A 01/02/2018   Procedure: RADIOACTIVE SEED IMPLANT/BRACHYTHERAPY IMPLANT;  Surgeon: Cleon Gustin, MD;  [facility];  Service: Urology;  Laterality: N/A;  . SHOULDER ARTHROSCOPY WITH ROTATOR CUFF REPAIR AND SUBACROMIAL DECOMPRESSION Right 08/05/2017   Procedure: RIGHT SHOULDER ARTHROSCOPY WITH ROTATOR CUFF REPAIR, DISTAL CLAVICLE EXCISION, DEBRIDEMENT AND SUBACROMIAL DECOMPRESSION;  Surgeon: Leandrew Koyanagi, MD;  Location: [facility];  Service: Orthopedics;  Laterality:  Right;  . SPACE OAR INSTILLATION N/A 01/02/2018   Procedure: SPACE OAR INSTILLATION;  Surgeon: Cleon Gustin, MD;  Location: [facility];  Service: Urology;  Laterality: N/A;   Social History   Occupational History  . Not on file  Tobacco Use  . Smoking status: Never Smoker  . Smokeless tobacco: Never Used  Substance and Sexual Activity  . Alcohol use: Not Currently  . Drug use: No  . Sexual activity: Not on file

## 2018-08-02 NOTE — Addendum Note (Signed)
Addended by: Precious Bard on: 08/02/2018 01:54 PM   Modules accepted: Orders

## 2018-08-14 ENCOUNTER — Other Ambulatory Visit: Payer: Self-pay

## 2018-08-14 ENCOUNTER — Encounter: Payer: Self-pay | Admitting: Internal Medicine

## 2018-08-14 ENCOUNTER — Ambulatory Visit: Payer: Self-pay | Attending: Internal Medicine | Admitting: Internal Medicine

## 2018-08-14 VITALS — BP 127/85 | HR 90 | Temp 98.8°F | Resp 16 | Wt 249.6 lb

## 2018-08-14 DIAGNOSIS — R030 Elevated blood-pressure reading, without diagnosis of hypertension: Secondary | ICD-10-CM | POA: Insufficient documentation

## 2018-08-14 DIAGNOSIS — R0602 Shortness of breath: Secondary | ICD-10-CM | POA: Insufficient documentation

## 2018-08-14 DIAGNOSIS — Z8546 Personal history of malignant neoplasm of prostate: Secondary | ICD-10-CM

## 2018-08-14 DIAGNOSIS — E785 Hyperlipidemia, unspecified: Secondary | ICD-10-CM | POA: Insufficient documentation

## 2018-08-14 DIAGNOSIS — L918 Other hypertrophic disorders of the skin: Secondary | ICD-10-CM | POA: Insufficient documentation

## 2018-08-14 DIAGNOSIS — Z6832 Body mass index (BMI) 32.0-32.9, adult: Secondary | ICD-10-CM | POA: Insufficient documentation

## 2018-08-14 DIAGNOSIS — C61 Malignant neoplasm of prostate: Secondary | ICD-10-CM | POA: Insufficient documentation

## 2018-08-14 DIAGNOSIS — E669 Obesity, unspecified: Secondary | ICD-10-CM | POA: Insufficient documentation

## 2018-08-14 NOTE — Progress Notes (Signed)
Patient ID: Roy Reed, male    DOB: Sep 19, 1955  MRN: [id number]  CC: skin mass  Subjective: Roy Reed is a 62 y.o. male who presents for chronic ds management. His concerns today include:  Pt with hx of pre-DM, rotator cuff tear s/p RT shoulder arthroscopy and prostate CA (hormone and radiation seeds).  Pt c/o having a lump on upper inner LT arm for yrs.  It has increased in size over the yrs. Not painful.    Prostate CA:  Completed external XRT.  Radiation seeds remain in place.  Receives hormone shot Q 6 mths.  Obesity:  Still walking 3 x a wk for 30-60 mims. Some SOB at times with exertion x 3 yrs. "Like I have to tell myself to breath."   No CP or LE edema.  NO PND. No cough or wheezing. No dizziness or fainting spells.   "I'm following the diet that was given."  Loss 13 lbs in past 2 mths.  Would like to have cholesterol rechecked.  He had mild elevation in ALT that had improved on last LFT  Patient Active Problem List   Diagnosis Date Noted  . Malignant neoplasm of prostate ([place]) 10/13/2017  . Rotator cuff tear arthropathy of right shoulder 08/31/2017  . Follow up 08/31/2017  . Personal history of gastric ulcer 02/09/2017     Current Outpatient Medications on File Prior to Visit  Medication Sig Dispense Refill  . atorvastatin (LIPITOR) 10 MG tablet Take 1 tablet (10 mg total) by mouth daily. 90 tablet 3  . tamsulosin (FLOMAX) 0.4 MG CAPS capsule TAKE 1 CAPUSLE BY MOUTH TWICE DAILY WITH A MEAL 60 capsule 2   No current facility-administered medications on file prior to visit.     Allergies  Allergen Reactions  . Aspirin     States he can take if he takes acid reducer medicine first    Social History   Socioeconomic History  . Marital status: Married    Spouse name: Not on file  . Number of children: Not on file  . Years of education: Not on file  . Highest education level: Not on file  Occupational History  . Not on file  Social Needs  .  Financial resource strain: Not on file  . Food insecurity:    Worry: Not on file    Inability: Not on file  . Transportation needs:    Medical: Not on file    Non-medical: Not on file  Tobacco Use  . Smoking status: Never Smoker  . Smokeless tobacco: Never Used  Substance and Sexual Activity  . Alcohol use: Not Currently  . Drug use: No  . Sexual activity: Not on file  Lifestyle  . Physical activity:    Days per week: Not on file    Minutes per session: Not on file  . Stress: Not on file  Relationships  . Social connections:    Talks on phone: Not on file    Gets together: Not on file    Attends religious service: Not on file    Active member of club or organization: Not on file    Attends meetings of clubs or organizations: Not on file    Relationship status: Not on file  . Intimate partner violence:    Fear of current or ex partner: Not on file    Emotionally abused: Not on file    Physically abused: Not on file    Forced sexual activity: Not on file  Other Topics Concern  . Not on file  Social History Narrative  . Not on file    No family history on file.  Past Surgical History:  Procedure Laterality Date  . COLONOSCOPY    . PROSTATE BIOPSY    . PROSTATE BIOPSY    . RADIOACTIVE SEED IMPLANT N/A 01/02/2018   Procedure: RADIOACTIVE SEED IMPLANT/BRACHYTHERAPY IMPLANT;  Surgeon: Cleon Gustin, MD;  Location: [facility];  Service: Urology;  Laterality: N/A;  . SHOULDER ARTHROSCOPY WITH ROTATOR CUFF REPAIR AND SUBACROMIAL DECOMPRESSION Right 08/05/2017   Procedure: RIGHT SHOULDER ARTHROSCOPY WITH ROTATOR CUFF REPAIR, DISTAL CLAVICLE EXCISION, DEBRIDEMENT AND SUBACROMIAL DECOMPRESSION;  Surgeon: Leandrew Koyanagi, MD;  Location: [facility];  Service: Orthopedics;  Laterality: Right;  . SPACE OAR INSTILLATION N/A 01/02/2018   Procedure: SPACE OAR INSTILLATION;  Surgeon: Cleon Gustin, MD;  Location: [facility];   Service: Urology;  Laterality: N/A;    ROS: Review of Systems Negative except as above PHYSICAL EXAM: BP 127/85   Pulse 90   Temp 98.8 F (37.1 C) (Oral)   Resp 16   Wt 249 lb 9.6 oz (113.2 kg)   SpO2 99%   BMI 32.05 kg/m   Wt Readings from Last 3 Encounters:  08/14/18 249 lb 9.6 oz (113.2 kg)  06/28/18 262 lb 6.4 oz (119 kg)  04/07/18 247 lb 3.2 oz (112.1 kg)   BP 127/85 Physical Exam  General appearance - alert, well appearing, and in no distress Mental status - normal mood, behavior, speech, dress, motor activity, and thought processes Neck - supple, no significant adenopathy Chest - clear to auscultation, no wheezes, rales or rhonchi, symmetric air entry Heart - normal rate, regular rhythm, normal S1, S2, no murmurs, rubs, clicks or gallops Extremities - peripheral pulses normal, no pedal edema, no clubbing or cyanosis Skin -moderate size skin tag about the size of a grape on the left upper inner arm.  It hangs on a stalk.  EKG: Normal sinus rhythm.  Normal EKG.  No old EKG to compare. ASSESSMENT AND PLAN:  1. Skin tag Patient advised of the diagnosis and benign nature.  Advised that it can be removed but sometimes they do grow back. - Ambulatory referral to Dermatology  2. History of prostate cancer Actively being treated by urology.  3. Obesity (BMI 30-39.9) Commended him on weight loss so far.  Encouraged him to continue regular exercise.  Dietary counseling given.  4. Hyperlipidemia, unspecified hyperlipidemia type - Lipid panel - Hepatic Function Panel  5. Elevated blood pressure reading Went over with his normal blood pressure.  Continue regular exercise.  DASH diet discussed and encouraged.  6. SOB (shortness of breath) on exertion This is chronic and does not seem to have progressed over the past 3 years.  No symptoms or signs to suggest CHF.  No symptoms or signs to suggest chronic lung disease.  EKG is normal.  Will observe for now. - EKG  12-Lead    Patient was given the opportunity to ask questions.  Patient verbalized understanding of the plan and was able to repeat key elements of the plan.  #[id number] Orders Placed This Encounter  Procedures  . Lipid panel  . Hepatic Function Panel  . Ambulatory referral to Dermatology  . EKG 12-Lead     Requested Prescriptions    No prescriptions requested or ordered in this encounter    Return in about 4 months (around 12/14/2018).  Karle Plumber, MD, FACP

## 2018-08-15 DIAGNOSIS — L918 Other hypertrophic disorders of the skin: Secondary | ICD-10-CM | POA: Insufficient documentation

## 2018-08-15 DIAGNOSIS — E785 Hyperlipidemia, unspecified: Secondary | ICD-10-CM | POA: Insufficient documentation

## 2018-08-15 DIAGNOSIS — E669 Obesity, unspecified: Secondary | ICD-10-CM | POA: Insufficient documentation

## 2018-08-15 LAB — LIPID PANEL
Chol/HDL Ratio: 3 ratio (ref 0.0–5.0)
Cholesterol, Total: 186 mg/dL (ref 100–199)
HDL: 63 mg/dL (ref >39)
LDL Calculated: 106 mg/dL — ABNORMAL HIGH (ref 0–99)
Triglycerides: 87 mg/dL (ref 0–149)
VLDL Cholesterol Cal: 17 mg/dL (ref 5–40)

## 2018-08-15 LAB — HEPATIC FUNCTION PANEL
ALBUMIN: 4.4 g/dL (ref 3.6–4.8)
ALT: 94 IU/L — ABNORMAL HIGH (ref 0–44)
AST: 38 IU/L (ref 0–40)
Alkaline Phosphatase: 118 IU/L — ABNORMAL HIGH (ref 39–117)
Bilirubin Total: 0.3 mg/dL (ref 0.0–1.2)
Bilirubin, Direct: 0.08 mg/dL (ref 0.00–0.40)
Total Protein: 7.5 g/dL (ref 6.0–8.5)

## 2018-08-23 MED FILL — ?ATORVASTATIN 10 MG TABLET: 10 | 30 days supply | Qty: 30 | Fill #8

## 2018-08-28 MED FILL — TAMSULOSIN HCL 0.4 MG CAP: 0.4 | 30 days supply | Qty: 60 | Fill #1

## 2018-09-05 ENCOUNTER — Ambulatory Visit (INDEPENDENT_AMBULATORY_CARE_PROVIDER_SITE_OTHER): Payer: Self-pay | Admitting: Orthopaedic Surgery

## 2018-09-05 DIAGNOSIS — G8929 Other chronic pain: Secondary | ICD-10-CM

## 2018-09-05 DIAGNOSIS — M25512 Pain in left shoulder: Secondary | ICD-10-CM

## 2018-09-05 MED ORDER — LIDOCAINE HCL 2 % IJ SOLN
2.0000 mL | INTRAMUSCULAR | Status: AC | PRN
  Administered 2018-09-05: 2 mL

## 2018-09-05 MED ORDER — BUPIVACAINE HCL 0.25 % IJ SOLN
2.0000 mL | INTRAMUSCULAR | Status: AC | PRN
  Administered 2018-09-05: 2 mL via INTRA_ARTICULAR

## 2018-09-05 MED ORDER — METHYLPREDNISOLONE ACETATE 40 MG/ML IJ SUSP
40.0000 mg | INTRAMUSCULAR | Status: AC | PRN
  Administered 2018-09-05: 40 mg via INTRA_ARTICULAR

## 2018-09-05 NOTE — Progress Notes (Signed)
Office Visit Note   Patient: Roy Reed           Date of Birth: 1956/01/26           MRN: [id number] Visit Date: 09/05/2018              Requested by: Ladell Pier, MD [address] [place], [place] [address] PCP: Ladell Pier, MD   Assessment & Plan: Visit Diagnoses:  1. Chronic left shoulder pain     Plan: Impression is left shoulder impingement syndrome with possible attritional rotator cuff tear.  The patient would like to proceed with one more cortisone injection to see if he notes improvement.  He will follow up with [name] as needed  Follow-Up Instructions: Return in about 2 weeks (around 09/19/2018).   Orders:  Orders Placed This Encounter  Procedures  . Large Joint Inj: L subacromial bursa   No orders of the defined types were placed in this encounter.     Procedures: Large Joint Inj: L subacromial bursa on 09/05/2018 2:41 PM Indications: pain Details: 22 G needle Medications: 2 mL lidocaine 2 %; 2 mL bupivacaine 0.25 %; 40 mg methylPREDNISolone acetate 40 MG/ML Outcome: tolerated well, no immediate complications Patient was prepped and draped in the usual sterile fashion.       Clinical Data: No additional findings.   Subjective: Chief Complaint  Patient presents with  . Left Shoulder - Follow-up    HPI patient is a pleasant 62 year old gentleman who presents to our clinic today with recurrent left shoulder pain.  This is been ongoing for the past few months.  No specific injury, however he plays a lot of tennis.  He was seen in our office on August 01, 2018 where a subacromial cortisone injection was performed.  He notes minimal improvement of symptoms following the injection.  He is still having pain to the deltoid and with range of motion of the shoulder.  Trouble sleeping on the left side.  Of note, he is approximately 1 year status post right shoulder arthroscopic rotator cuff repair from an attritional rotator cuff tear.  Doing  very well in regards to the right shoulder.  Review of Systems as detailed in HPI.  All others reviewed and are negative.   Objective: Vital Signs: There were no vitals taken for this visit.  Physical Exam well-developed well-nourished gentleman in no acute distress.  Alert and oriented x3.  Ortho Exam examination of the left shoulder reveals forward flexion to about 160 degrees.  This is significantly painful with the extreme.  He can internally rotate to his back pocket.  Positive empty can and cross body adduction.  Specialty Comments:  No specialty comments available.  Imaging: No new imaging   PMFS History: Patient Active Problem List   Diagnosis Date Noted  . Chronic left shoulder pain 09/05/2018  . Hyperlipidemia 08/15/2018  . Obesity (BMI 30-39.9) 08/15/2018  . Skin tag 08/15/2018  . Malignant neoplasm of prostate ([place]) 10/13/2017  . Rotator cuff tear arthropathy of right shoulder 08/31/2017  . Personal history of gastric ulcer 02/09/2017   Past Medical History:  Diagnosis Date  . Elevated PSA   . Left knee pain   . Low back pain   . Pre-diabetes   . Prostate cancer ([place])   . Rotator cuff disorder, right     No family history on file.  Past Surgical History:  Procedure Laterality Date  . COLONOSCOPY    . PROSTATE BIOPSY    .  PROSTATE BIOPSY    . RADIOACTIVE SEED IMPLANT N/A 01/02/2018   Procedure: RADIOACTIVE SEED IMPLANT/BRACHYTHERAPY IMPLANT;  Surgeon: Cleon Gustin, MD;  Location: [facility];  Service: Urology;  Laterality: N/A;  . SHOULDER ARTHROSCOPY WITH ROTATOR CUFF REPAIR AND SUBACROMIAL DECOMPRESSION Right 08/05/2017   Procedure: RIGHT SHOULDER ARTHROSCOPY WITH ROTATOR CUFF REPAIR, DISTAL CLAVICLE EXCISION, DEBRIDEMENT AND SUBACROMIAL DECOMPRESSION;  Surgeon: Leandrew Koyanagi, MD;  Location: [place];  Service: Orthopedics;  Laterality: Right;  . SPACE OAR INSTILLATION N/A 01/02/2018   Procedure: SPACE OAR  INSTILLATION;  Surgeon: Cleon Gustin, MD;  Location: [facility];  Service: Urology;  Laterality: N/A;   Social History   Occupational History  . Not on file  Tobacco Use  . Smoking status: Never Smoker  . Smokeless tobacco: Never Used  Substance and Sexual Activity  . Alcohol use: Not Currently  . Drug use: No  . Sexual activity: Not on file

## 2018-09-19 ENCOUNTER — Encounter (INDEPENDENT_AMBULATORY_CARE_PROVIDER_SITE_OTHER): Payer: Self-pay | Admitting: Orthopaedic Surgery

## 2018-09-19 ENCOUNTER — Ambulatory Visit (INDEPENDENT_AMBULATORY_CARE_PROVIDER_SITE_OTHER): Payer: Self-pay | Admitting: Orthopaedic Surgery

## 2018-09-19 DIAGNOSIS — M25512 Pain in left shoulder: Secondary | ICD-10-CM

## 2018-09-19 DIAGNOSIS — G8929 Other chronic pain: Secondary | ICD-10-CM

## 2018-09-19 NOTE — Progress Notes (Signed)
Office Visit Note   Patient: Roy Reed           Date of Birth: 09-Apr-1956           MRN: [id number] Visit Date: 09/19/2018              Requested by: Ladell Pier, MD [address] [place], [place] [address] PCP: Ladell Pier, MD   Assessment & Plan: Visit Diagnoses:  1. Chronic left shoulder pain     Plan: At this point patient has failed conservative treatment.  Recommend MRI of the left shoulder rule out structural abnormalities.  Follow-up after the MRI.  Follow-Up Instructions: Return in about 2 weeks (around 10/03/2018).   Orders:  No orders of the defined types were placed in this encounter.  No orders of the defined types were placed in this encounter.     Procedures: No procedures performed   Clinical Data: No additional findings.   Subjective: Chief Complaint  Patient presents with  . Left Shoulder - Follow-up    Patient presents today with his interpreter.  He follows up for continued left shoulder pain.  The injection has improved his pain mildly.   Review of Systems  Constitutional: Negative.   All other systems reviewed and are negative.    Objective: Vital Signs: There were no vitals taken for this visit.  Physical Exam Vitals signs and nursing note reviewed.  Constitutional:      Appearance: He is well-developed.  Pulmonary:     Effort: Pulmonary effort is normal.  Abdominal:     Palpations: Abdomen is soft.  Skin:    General: Skin is warm.  Neurological:     Mental Status: He is alert and oriented to person, place, and time.  Psychiatric:        Behavior: Behavior normal.        Thought Content: Thought content normal.        Judgment: Judgment normal.     Ortho Exam Left shoulder exam shows pain with empty can testing overall strength is normal to manual muscle testing.  Positive impingement.  Mildly positive speeds test.  Negative crank. Specialty Comments:  No specialty comments  available.  Imaging: No results found.   PMFS History: Patient Active Problem List   Diagnosis Date Noted  . Chronic left shoulder pain 09/05/2018  . Hyperlipidemia 08/15/2018  . Obesity (BMI 30-39.9) 08/15/2018  . Skin tag 08/15/2018  . Malignant neoplasm of prostate ([place]) 10/13/2017  . Rotator cuff tear arthropathy of right shoulder 08/31/2017  . Personal history of gastric ulcer 02/09/2017   Past Medical History:  Diagnosis Date  . Elevated PSA   . Left knee pain   . Low back pain   . Pre-diabetes   . Prostate cancer ([place])   . Rotator cuff disorder, right     No family history on file.  Past Surgical History:  Procedure Laterality Date  . COLONOSCOPY    . PROSTATE BIOPSY    . PROSTATE BIOPSY    . RADIOACTIVE SEED IMPLANT N/A 01/02/2018   Procedure: RADIOACTIVE SEED IMPLANT/BRACHYTHERAPY IMPLANT;  Surgeon: Cleon Gustin, MD;  Location: [facility];  Service: Urology;  Laterality: N/A;  . SHOULDER ARTHROSCOPY WITH ROTATOR CUFF REPAIR AND SUBACROMIAL DECOMPRESSION Right 08/05/2017   Procedure: RIGHT SHOULDER ARTHROSCOPY WITH ROTATOR CUFF REPAIR, DISTAL CLAVICLE EXCISION, DEBRIDEMENT AND SUBACROMIAL DECOMPRESSION;  Surgeon: Leandrew Koyanagi, MD;  Location: [place];  Service: Orthopedics;  Laterality: Right;  .  SPACE OAR INSTILLATION N/A 01/02/2018   Procedure: SPACE OAR INSTILLATION;  Surgeon: Cleon Gustin, MD;  Location: [facility];  Service: Urology;  Laterality: N/A;   Social History   Occupational History  . Not on file  Tobacco Use  . Smoking status: Never Smoker  . Smokeless tobacco: Never Used  Substance and Sexual Activity  . Alcohol use: Not Currently  . Drug use: No  . Sexual activity: Not on file

## 2018-09-19 NOTE — Addendum Note (Signed)
Addended by: Precious Bard on: 09/19/2018 04:57 PM   Modules accepted: Orders

## 2018-09-20 ENCOUNTER — Encounter: Payer: Self-pay | Admitting: Internal Medicine

## 2018-09-22 MED FILL — ?ATORVASTATIN 10 MG TABLET: 10 | 30 days supply | Qty: 30 | Fill #9

## 2018-09-22 MED FILL — TAMSULOSIN HCL 0.4 MG CAP: 0.4 | 30 days supply | Qty: 60 | Fill #2

## 2018-09-28 ENCOUNTER — Ambulatory Visit: Payer: Self-pay | Admitting: Internal Medicine

## 2018-10-03 ENCOUNTER — Encounter (INDEPENDENT_AMBULATORY_CARE_PROVIDER_SITE_OTHER): Payer: Self-pay

## 2018-10-03 ENCOUNTER — Ambulatory Visit (INDEPENDENT_AMBULATORY_CARE_PROVIDER_SITE_OTHER): Payer: Self-pay | Admitting: Orthopaedic Surgery

## 2018-10-06 ENCOUNTER — Telehealth (INDEPENDENT_AMBULATORY_CARE_PROVIDER_SITE_OTHER): Payer: Self-pay | Admitting: Specialist

## 2018-10-09 ENCOUNTER — Other Ambulatory Visit: Payer: Self-pay

## 2018-10-09 ENCOUNTER — Ambulatory Visit
Admission: RE | Admit: 2018-10-09 | Discharge: 2018-10-09 | Disposition: A | Discharge status: Home / Self Care | Payer: No Typology Code available for payment source | Source: Ambulatory Visit | Attending: Orthopaedic Surgery | Admitting: Orthopaedic Surgery

## 2018-10-09 DIAGNOSIS — M25512 Pain in left shoulder: Principal | ICD-10-CM

## 2018-10-09 DIAGNOSIS — G8929 Other chronic pain: Secondary | ICD-10-CM

## 2018-10-12 ENCOUNTER — Ambulatory Visit: Payer: Self-pay

## 2018-10-13 ENCOUNTER — Ambulatory Visit (INDEPENDENT_AMBULATORY_CARE_PROVIDER_SITE_OTHER): Payer: Self-pay | Admitting: Orthopaedic Surgery

## 2018-10-13 ENCOUNTER — Encounter (INDEPENDENT_AMBULATORY_CARE_PROVIDER_SITE_OTHER): Payer: Self-pay | Admitting: Orthopaedic Surgery

## 2018-10-13 DIAGNOSIS — G8929 Other chronic pain: Secondary | ICD-10-CM

## 2018-10-13 DIAGNOSIS — M25512 Pain in left shoulder: Secondary | ICD-10-CM

## 2018-10-13 NOTE — Progress Notes (Signed)
Office Visit Note   Patient: Roy Reed           Date of Birth: 03-Feb-1956           MRN: [id number] Visit Date: 10/13/2018              Requested by: Ladell Pier, MD [address] [place], [place] [address] PCP: Ladell Pier, MD   Assessment & Plan: Visit Diagnoses:  1. Chronic left shoulder pain     Plan: Impression is left shoulder AC joint osteoarthritis and rotator cuff tendinopathy.  We will refer the patient to Dr. Junius Roads for an ultrasound-guided [facility] joint injection.  Follow-up with [name] as needed.  Follow-Up Instructions: Return in about 1 week (around 10/20/2018) for left shoulder AC joint injection when Dr. Junius Roads first available.   Orders:  No orders of the defined types were placed in this encounter.  No orders of the defined types were placed in this encounter.     Procedures: No procedures performed   Clinical Data: No additional findings.   Subjective: Chief Complaint  Patient presents with  . Left Shoulder - Pain    HPI patient is a pleasant 63 year old gentleman who presents our clinic today discuss MRI results of the left shoulder.  MRI results of the left shoulder from 10/09/2018 show tendinopathy supraspinatus and infraspinatus as well as long head of the biceps.  Main finding is moderate AC joint osteoarthritis.  The patient has failed subacromial cortisone injection twice.  He has not yet had an AC joint injection.  Review of Systems as detailed in HPI.  All others you and are negative.   Objective: Vital Signs: There were no vitals taken for this visit.  Physical Exam well-developed well-nourished gentleman in no acute distress.  Alert and oriented x3.  Ortho Exam examination of left shoulder shows full active range of motion although he has pain with the extremes of all directions.  He has moderate tenderness over the [facility] joint.  Positive empty can and cross body adduction.  He is neurovascularly intact distally.  Specialty  Comments:  No specialty comments available.  Imaging: No new imaging   PMFS History: Patient Active Problem List   Diagnosis Date Noted  . Chronic left shoulder pain 09/05/2018  . Hyperlipidemia 08/15/2018  . Obesity (BMI 30-39.9) 08/15/2018  . Skin tag 08/15/2018  . Malignant neoplasm of prostate ([place]) 10/13/2017  . Rotator cuff tear arthropathy of right shoulder 08/31/2017  . Personal history of gastric ulcer 02/09/2017   Past Medical History:  Diagnosis Date  . Elevated PSA   . Left knee pain   . Low back pain   . Pre-diabetes   . Prostate cancer ([place])   . Rotator cuff disorder, right     History reviewed. No pertinent family history.  Past Surgical History:  Procedure Laterality Date  . COLONOSCOPY    . PROSTATE BIOPSY    . PROSTATE BIOPSY    . RADIOACTIVE SEED IMPLANT N/A 01/02/2018   Procedure: RADIOACTIVE SEED IMPLANT/BRACHYTHERAPY IMPLANT;  Surgeon: Cleon Gustin, MD;  Location: [facility];  Service: Urology;  Laterality: N/A;  . SHOULDER ARTHROSCOPY WITH ROTATOR CUFF REPAIR AND SUBACROMIAL DECOMPRESSION Right 08/05/2017   Procedure: RIGHT SHOULDER ARTHROSCOPY WITH ROTATOR CUFF REPAIR, DISTAL CLAVICLE EXCISION, DEBRIDEMENT AND SUBACROMIAL DECOMPRESSION;  Surgeon: Leandrew Koyanagi, MD;  Location: [place];  Service: Orthopedics;  Laterality: Right;  . SPACE OAR INSTILLATION N/A 01/02/2018   Procedure: SPACE OAR  INSTILLATION;  Surgeon: Cleon Gustin, MD;  Location: [facility];  Service: Urology;  Laterality: N/A;   Social History   Occupational History  . Not on file  Tobacco Use  . Smoking status: Never Smoker  . Smokeless tobacco: Never Used  Substance and Sexual Activity  . Alcohol use: Not Currently  . Drug use: No  . Sexual activity: Not on file

## 2018-10-20 ENCOUNTER — Encounter (INDEPENDENT_AMBULATORY_CARE_PROVIDER_SITE_OTHER): Payer: Self-pay | Admitting: Family Medicine

## 2018-10-20 ENCOUNTER — Ambulatory Visit (INDEPENDENT_AMBULATORY_CARE_PROVIDER_SITE_OTHER): Payer: Self-pay | Admitting: Family Medicine

## 2018-10-20 DIAGNOSIS — G8929 Other chronic pain: Secondary | ICD-10-CM

## 2018-10-20 DIAGNOSIS — M25512 Pain in left shoulder: Secondary | ICD-10-CM

## 2018-10-20 MED ORDER — METHYLPREDNISOLONE ACETATE 40 MG/ML IJ SUSP: 40.0000 mg | Freq: Once | INTRAMUSCULAR | Status: DC

## 2018-10-20 NOTE — Progress Notes (Signed)
Subjective: He is here for ultrasound-guided left AC joint injection.  Objective: Very tender to palpation of the [facility] joint and pain at the extremes of range of motion.  Procedure: Ultrasound-guided left AC joint injection: After sterile prep with Betadine, injected 4 cc 1% lidocaine without epinephrine and 1 cc / 40 mg methylprednisolone using ultrasound to guide needle placement into the [facility] joint without complication.  He had moderate improvement in pain during the immediate anesthetic phase.  He will follow-up as directed.

## 2018-10-23 MED FILL — ?ATORVASTATIN 10 MG TABLET: 10 | 30 days supply | Qty: 30 | Fill #10

## 2018-10-26 MED FILL — TAMSULOSIN HCL 0.4 MG CAP: 0.4 | 30 days supply | Qty: 30 | Fill #0

## 2018-11-03 ENCOUNTER — Ambulatory Visit (INDEPENDENT_AMBULATORY_CARE_PROVIDER_SITE_OTHER): Payer: Self-pay | Admitting: Orthopaedic Surgery

## 2018-11-03 ENCOUNTER — Encounter (INDEPENDENT_AMBULATORY_CARE_PROVIDER_SITE_OTHER): Payer: Self-pay | Admitting: Physician Assistant

## 2018-11-03 DIAGNOSIS — G8929 Other chronic pain: Secondary | ICD-10-CM

## 2018-11-03 DIAGNOSIS — M25512 Pain in left shoulder: Secondary | ICD-10-CM

## 2018-11-03 NOTE — Progress Notes (Signed)
Office Visit Note   Patient: Roy Reed           Date of Birth: [birth year]           MRN: [id number] Visit Date: 11/03/2018              Requested by: Ladell Pier, MD [address] [place], [place] [address] PCP: Ladell Pier, MD   Assessment & Plan: Visit Diagnoses:  1. Chronic left shoulder pain     Plan: Impression is left shoulder AC joint arthropathy and early adhesive capsulitis.  I will start the patient in formal physical therapy at this point.  He will continue with this for 1 to 2 months and follow-up with [name] if he does not see improvement.  Follow-Up Instructions: Return if symptoms worsen or fail to improve.   Orders:  No orders of the defined types were placed in this encounter.  No orders of the defined types were placed in this encounter.     Procedures: No procedures performed   Clinical Data: No additional findings.   Subjective: No chief complaint on file.   HPI patient is a 63 year old gentleman who presents our clinic today with continued left shoulder pain.  History of AC joint arthropathy and mild rotator cuff tendinitis seen on MRI from 10/09/2018.  He has had continued pain not only to the [facility] joint but primarily into the deltoid.  Pain is worse with range of motion of the shoulder.  He does note that he is starting to get less range of motion due to the pain.  We have tried to subacromial cortisone injections with minimal relief of symptoms.  Dr. Junius Roads injected the [facility] joint on 10/20/2017 with minimal relief of symptoms.  He has not yet attended formal physical therapy.  Of note, he denies any neck pain left upper extremity weakness or numbness, tingling or burning.  Review of Systems as detailed in HPI.  All others reviewed and are negative.   Objective: Vital Signs: There were no vitals taken for this visit.  Physical Exam well-developed and well-nourished gentleman in no acute distress.  Alert and oriented x3.  Ortho Exam  examination of the left shoulder reveals 75% active range of motion in all planes.  Near full strength throughout.  Minimally positive empty can.  Hard to positive cross body abduction.  He is neurovascularly intact distally.  Specialty Comments:  No specialty comments available.  Imaging: No new imaging   PMFS History: Patient Active Problem List   Diagnosis Date Noted  . Chronic left shoulder pain 09/05/2018  . Hyperlipidemia 08/15/2018  . Obesity (BMI 30-39.9) 08/15/2018  . Skin tag 08/15/2018  . Malignant neoplasm of prostate ([place]) 10/13/2017  . Rotator cuff tear arthropathy of right shoulder 08/31/2017  . Personal history of gastric ulcer 02/09/2017   Past Medical History:  Diagnosis Date  . Elevated PSA   . Left knee pain   . Low back pain   . Pre-diabetes   . Prostate cancer ([place])   . Rotator cuff disorder, right     History reviewed. No pertinent family history.  Past Surgical History:  Procedure Laterality Date  . COLONOSCOPY    . PROSTATE BIOPSY    . PROSTATE BIOPSY    . RADIOACTIVE SEED IMPLANT N/A 01/02/2018   Procedure: RADIOACTIVE SEED IMPLANT/BRACHYTHERAPY IMPLANT;  Surgeon: Cleon Gustin, MD;  Location: [facility];  Service: Urology;  Laterality: N/A;  . SHOULDER ARTHROSCOPY WITH ROTATOR CUFF  REPAIR AND SUBACROMIAL DECOMPRESSION Right 08/05/2017   Procedure: RIGHT SHOULDER ARTHROSCOPY WITH ROTATOR CUFF REPAIR, DISTAL CLAVICLE EXCISION, DEBRIDEMENT AND SUBACROMIAL DECOMPRESSION;  Surgeon: Leandrew Koyanagi, MD;  Location: [place];  Service: Orthopedics;  Laterality: Right;  . SPACE OAR INSTILLATION N/A 01/02/2018   Procedure: SPACE OAR INSTILLATION;  Surgeon: Cleon Gustin, MD;  Location: [facility];  Service: Urology;  Laterality: N/A;   Social History   Occupational History  . Not on file  Tobacco Use  . Smoking status: Never Smoker  . Smokeless tobacco: Never Used  Substance and Sexual Activity    . Alcohol use: Not Currently  . Drug use: No  . Sexual activity: Not on file

## 2018-11-13 ENCOUNTER — Telehealth: Payer: Self-pay | Admitting: Internal Medicine

## 2018-11-13 MED FILL — TAMSULOSIN HCL 0.4 MG CAP: 0.4 | 30 days supply | Qty: 30 | Fill #1

## 2018-11-13 MED FILL — ALFUZOSIN HCL ER 10 MG TAB: 10 | 30 days supply | Qty: 30 | Fill #1

## 2018-11-13 NOTE — Telephone Encounter (Signed)
The patients urologist Dr. Antonietta Jewel sent refills to [facility].

## 2018-11-13 NOTE — Telephone Encounter (Signed)
New Message   1) Medication(s) Requested (by name): tamsulosin (FLOMAX) 0.4 MG CAPS capsule  2) Pharmacy of Choice: [facility]  3) Special Requests:   Approved medications will be sent to the pharmacy, we will reach out if there is an issue.  Requests made after 3pm may not be addressed until the following business day!  If a patient is unsure of the name of the medication(s) please note and ask patient to call back when they are able to provide all info, do not send to responsible party until all information is available!

## 2018-11-14 ENCOUNTER — Ambulatory Visit: Payer: Self-pay | Attending: Orthopaedic Surgery | Admitting: Physical Therapy

## 2018-11-14 ENCOUNTER — Other Ambulatory Visit: Payer: Self-pay

## 2018-11-14 ENCOUNTER — Encounter: Payer: Self-pay | Admitting: Physical Therapy

## 2018-11-14 DIAGNOSIS — M25512 Pain in left shoulder: Secondary | ICD-10-CM | POA: Insufficient documentation

## 2018-11-14 DIAGNOSIS — M25612 Stiffness of left shoulder, not elsewhere classified: Secondary | ICD-10-CM | POA: Insufficient documentation

## 2018-11-14 DIAGNOSIS — G8929 Other chronic pain: Secondary | ICD-10-CM | POA: Insufficient documentation

## 2018-11-14 NOTE — Therapy (Signed)
[place], [place], [address] Phone: [phone]   Fax:  [phone]  Physical Therapy Evaluation  Patient Details  Name: Roy Reed MRN: [id number] Date of Birth: 1956-09-06 Referring Provider (PT): Dr Frankey Shown    Encounter Date: 11/14/2018  PT End of Session - 11/14/18 1348    Visit Number  1    Number of Visits  16    Date for PT Re-Evaluation  01/09/19    Authorization Type  CAFA     PT Start Time  1330    PT Stop Time  1412    PT Time Calculation (min)  42 min    Activity Tolerance  Patient tolerated treatment well    Behavior During Therapy  [facility] for tasks assessed/performed       Past Medical History:  Diagnosis Date  . Elevated PSA   . Left knee pain   . Low back pain   . Pre-diabetes   . Prostate cancer ([place])   . Rotator cuff disorder, right     Past Surgical History:  Procedure Laterality Date  . COLONOSCOPY    . PROSTATE BIOPSY    . PROSTATE BIOPSY    . RADIOACTIVE SEED IMPLANT N/A 01/02/2018   Procedure: RADIOACTIVE SEED IMPLANT/BRACHYTHERAPY IMPLANT;  Surgeon: Cleon Gustin, MD;  Location: [facility];  Service: Urology;  Laterality: N/A;  . SHOULDER ARTHROSCOPY WITH ROTATOR CUFF REPAIR AND SUBACROMIAL DECOMPRESSION Right 08/05/2017   Procedure: RIGHT SHOULDER ARTHROSCOPY WITH ROTATOR CUFF REPAIR, DISTAL CLAVICLE EXCISION, DEBRIDEMENT AND SUBACROMIAL DECOMPRESSION;  Surgeon: Leandrew Koyanagi, MD;  Location: [place];  Service: Orthopedics;  Laterality: Right;  . SPACE OAR INSTILLATION N/A 01/02/2018   Procedure: SPACE OAR INSTILLATION;  Surgeon: Cleon Gustin, MD;  Location[facility];  Service: Urology;  Laterality: N/A;    There were no vitals filed for this visit.   Subjective Assessment - 11/14/18 1336    Subjective  Patient had an incidious onset of left shoulder pain in late 2020. The pain feels like it  isaround his lower biceps. His MRI shows moderate AC joint arthritis. He has pain when he is reaching back and when he sleeps. His right arm is doing well. He has had 3 injections but they have not worked.     Limitations  Lifting;Other (comment)   reaching back    Diagnostic tests  MRI: No tear but tendinitis of suprapsinatus and ifraspinatus; also inflammation of the bicpes     Currently in Pain?  Yes    Pain Score  --   unable to ask because of language barrier    Pain Orientation  Left    Pain Descriptors / Indicators  Aching    Pain Type  Chronic pain    Pain Onset  More than a month ago    Pain Frequency  Constant    Aggravating Factors   reaching back     Pain Relieving Factors  injection a little bit.     Effect of Pain on Daily Activities  difficulty perfroming daily tasks          [facility] PT Assessment - 11/14/18 0001      Assessment   Medical Diagnosis  Left Shoulder Pain     Referring Provider (PT)  Dr Frankey Shown     Onset Date/Surgical Date  --   Late 2019    Hand Dominance  Right    Next MD  Visit  Nothing scheduled     Prior Therapy  Had therapy for his right arm       Precautions   Precautions  None      Restrictions   Weight Bearing Restrictions  No      Balance Screen   Has the patient fallen in the past 6 months  No    Has the patient had a decrease in activity level because of a fear of falling?   No    Is the patient reluctant to leave their home because of a fear of falling?   No      Home Environment   Additional Comments  nothing significant      Prior Function   Level of Independence  Independent    Leisure  plays tennis       Cognition   Overall Cognitive Status  Within Functional Limits for tasks assessed    Attention  Focused    Focused Attention  Appears intact    Memory  Appears intact    Awareness  Appears intact    Problem Solving  Appears intact      Observation/Other Assessments   Focus on Therapeutic Outcomes (FOTO)   51%  limitation 33% expected       Sensation   Light Touch  Appears Intact      Coordination   Gross Motor Movements are Fluid and Coordinated  Yes    Fine Motor Movements are Fluid and Coordinated  Yes      Posture/Postural Control   Posture Comments  mild rounding of the shoulders       ROM / Strength   AROM / PROM / Strength  AROM;PROM;Strength      AROM   AROM Assessment Site  Shoulder    Right/Left Shoulder  Right    Right Shoulder Flexion  105 Degrees    Right Shoulder Internal Rotation  --   to belt with pain    Right Shoulder External Rotation  --   to head with pain      PROM   PROM Assessment Site  Shoulder    Right/Left Shoulder  Left    Left Shoulder Flexion  140 Degrees    Left Shoulder ABduction  85 Degrees    Left Shoulder Internal Rotation  80 Degrees    Left Shoulder External Rotation  55 Degrees      Strength   Strength Assessment Site  Shoulder    Right/Left Shoulder  Right;Left    Right Shoulder Flexion  5/5    Right Shoulder ABduction  5/5    Right Shoulder Internal Rotation  5/5    Right Shoulder External Rotation  5/5    Left Shoulder Flexion  4+/5    Left Shoulder ABduction  3+/5    Left Shoulder Internal Rotation  5/5    Left Shoulder External Rotation  4+/5      Palpation   Palpation comment  tender to palpation in the anterior and lateral shoulder. No spasming of the upper trap       Special Tests   Other special tests  hawkins (-) speeds (+) empty can (+)                Objective measurements completed on examination: See above findings.      [facility] Adult PT Treatment/Exercise - 11/14/18 0001      Exercises   Exercises  Shoulder      Shoulder Exercises: Supine  Other Supine Exercises  wand flex x10      Shoulder Exercises: Standing   Extension Limitations  2x10 green     Row Limitations  2x10 green       Manual Therapy   Manual Therapy  Passive ROM;Joint mobilization    Joint Mobilization  PA glide and inferio grade  III and IV     Passive ROM  into flexion, abduction, and ER              PT Education - 11/14/18 1347    Education Details  HEP and symptom mangement     Person(s) Educated  Patient    Methods  Explanation;Demonstration;Tactile cues;Verbal cues    Comprehension  Returned demonstration;Verbalized understanding;Verbal cues required;Tactile cues required;Need further instruction       PT Short Term Goals - 11/14/18 1344      PT SHORT TERM GOAL #1   Title  Patient will demosntrate full passive left shoulder flexion without pain     Time  4    Period  Weeks    Status  New    Target Date  12/12/18      PT SHORT TERM GOAL #2   Title  Patient will demostrate 5/5 gross left shoulder strength     Time  4    Period  Weeks    Status  New    Target Date  12/12/18      PT SHORT TERM GOAL #3   Title  Patient will improve active left shoulder flexion and abduction     Time  4    Period  Weeks    Status  New    Target Date  12/12/18        PT Long Term Goals - 11/14/18 1345      PT LONG TERM GOAL #1   Title  Patient will sleep through the night without pain     Time  8    Period  Weeks    Status  New    Target Date  01/09/19      PT LONG TERM GOAL #2   Title  Patient will return to tennis without left shoulder pain     Time  8    Period  Weeks    Status  New    Target Date  01/09/19      PT LONG TERM GOAL #3   Title  Patient will reach behind him in the car without pain with his left arm    Time  8    Period  Weeks    Status  New    Target Date  01/09/19      PT LONG TERM GOAL #4   Title  Patient will demonstrate a 33% limitation on FOTO     Time  8    Period  Weeks    Status  New    Target Date  06/24/20             Plan - 11/14/18 1349    Clinical Impression Statement  Patient is a 63 year old amle with incidious onset of left shoulder pain. He has pain when he reaches over his head and when he sleeps. He has a positive speeds test and empty can  test. Signs and symptoms are consitent with RTC and bicpes tendinitis. He would beneift from skilled therapy for manual therapy, decreased pain, and return to functional activity.     Personal Factors and Comorbidities  Comorbidity 2    Comorbidities  low back pain. Right shoulder RTC repai 2019.     Examination-Activity Limitations  Caring for Others;Carry;Sleep;Hygiene/Grooming    Examination-Participation Restrictions  Cleaning;Community Activity;Yard Work    Stability/Clinical Decision Making  Stable/Uncomplicated    Clinical Decision Making  Low    Rehab Potential  Good    PT Frequency  2x / week    PT Duration  6 weeks    PT Treatment/Interventions  ADLs/Self Care Home Management;Cryotherapy;Dentist;Therapeutic activities;Therapeutic exercise;Neuromuscular re-education;Patient/family education;Manual techniques;Passive range of motion;Dry needling;Taping    PT Next Visit Plan  manual therapy to improve motion, add pec stretch if able; add band IR/ER and maybe sidelying ER;     PT Home Exercise Plan  wand flexion; scap retraction; shoulder extension     Consulted and Agree with Plan of Care  Patient       Patient will benefit from skilled therapeutic intervention in order to improve the following deficits and impairments:  Pain, Decreased activity tolerance, Decreased endurance, Decreased range of motion, Decreased strength, Impaired UE functional use, Increased muscle spasms  Visit Diagnosis: Chronic left shoulder pain  Stiffness of left shoulder, not elsewhere classified     Problem List Patient Active Problem List   Diagnosis Date Noted  . Chronic left shoulder pain 09/05/2018  . Hyperlipidemia 08/15/2018  . Obesity (BMI 30-39.9) 08/15/2018  . Skin tag 08/15/2018  . Malignant neoplasm of prostate ([place]) 10/13/2017  . Rotator cuff tear arthropathy of right shoulder 08/31/2017  . Personal history of gastric ulcer  02/09/2017    Carney Living PT DPT  11/14/2018, 2:41 PM  [facility] [address] [place], [place], [address] Phone: [phone]   Fax:  [phone]  Name: Roy Reed MRN: [id number] Date of Birth: 24-Sep-1955

## 2018-11-23 ENCOUNTER — Ambulatory Visit: Payer: Self-pay

## 2018-11-28 ENCOUNTER — Ambulatory Visit: Payer: Self-pay | Admitting: Physical Therapy

## 2018-11-28 MED FILL — TAMSULOSIN HCL 0.4 MG CAP: 0.4 | 30 days supply | Qty: 30 | Fill #2

## 2018-11-30 ENCOUNTER — Ambulatory Visit: Payer: Self-pay | Admitting: Physical Therapy

## 2018-12-01 ENCOUNTER — Other Ambulatory Visit: Payer: Self-pay | Admitting: Internal Medicine

## 2018-12-01 MED FILL — ?ATORVASTATIN 10 MG TABLET: 10 | 30 days supply | Qty: 30 | Fill #0

## 2018-12-05 ENCOUNTER — Ambulatory Visit: Payer: Self-pay | Admitting: Physical Therapy

## 2018-12-07 ENCOUNTER — Telehealth: Payer: Self-pay | Admitting: Physical Therapy

## 2018-12-07 ENCOUNTER — Ambulatory Visit: Payer: No Typology Code available for payment source | Admitting: Physical Therapy

## 2018-12-07 NOTE — Telephone Encounter (Signed)
Roy Reed was contacted today regarding the temporary reduction of OP Rehab Services due to concerns for community transmission of Covid-19.  Through interpreter service   Therapist advised the patient to continue to perform their HEP and assured they had no unanswered questions at this time. The patient was does not speak [place] or Spanish so tele-health was not offered. He will likely need manual therapy for his shoulder and is independent with his exercises.    Outpatient Rehabilitation Services will follow up with this client when we are able to safely resume care at the [facility] in person.   Patient is aware we can be reached by telephone during limited business hours in the meantime.

## 2018-12-12 ENCOUNTER — Ambulatory Visit: Payer: Self-pay | Attending: Internal Medicine | Admitting: Internal Medicine

## 2018-12-12 ENCOUNTER — Ambulatory Visit: Payer: No Typology Code available for payment source | Admitting: Physical Therapy

## 2018-12-12 ENCOUNTER — Other Ambulatory Visit: Payer: Self-pay

## 2018-12-12 DIAGNOSIS — R7989 Other specified abnormal findings of blood chemistry: Secondary | ICD-10-CM

## 2018-12-12 DIAGNOSIS — C61 Malignant neoplasm of prostate: Secondary | ICD-10-CM

## 2018-12-12 DIAGNOSIS — E785 Hyperlipidemia, unspecified: Secondary | ICD-10-CM

## 2018-12-12 DIAGNOSIS — E669 Obesity, unspecified: Secondary | ICD-10-CM

## 2018-12-12 DIAGNOSIS — R945 Abnormal results of liver function studies: Secondary | ICD-10-CM

## 2018-12-12 NOTE — Progress Notes (Signed)
Virtual Visit via Telephone Note  I connected with Roy Reed on 12/12/18 at 2:20 p.m by telephone from my office and verified that I am speaking with the correct person using two identifiers. Pt is at home.  Wife joined [name] on this telephone encounter helping the patient answer some questions.   I discussed the limitations, risks, security and privacy concerns of performing an evaluation and management service by telephone and the availability of in person appointments. I also discussed with the patient that there may be a patient responsible charge related to this service. The patient expressed understanding and agreed to proceed.   History of Present Illness: Pt with hx of pre-DM, rotator cuff tear s/p RT shoulder arthroscopy andprostate CA (hormone and radiation seeds).  Last seen 08/2018  Elev BP: limiting salt in foods.  No device to check BP Walking 3 x a wk No CP/SOB/LE  Obesity:  Last wgh was 247 lbs yesterday at home.  Exercising 3 times a week by walking.  He feels he is doing okay with his eating habits.  Getting in fresh fruits and vegetables into his diet.  Prostate CA: Has f/u appt with Dr. Alyson Ingles on 12/20/2018.  Needs to have PSA done prior to that visit.  Wants to know if he can have it drawn through our lab.  HL:  Was out of Lipitor x 2 wks. He came to the pharmacy to get a refill but no refill was on it.  I did get the refill request and approved it.  Patient states he received the medication in the mail about a week ago but was not sure whether he was supposed to continue taking it. We have been keeping check on his liver function test.  He has had mild elevation in the ALT.  Observations/Objective: No direct observations done as this was a telephone encounter.  Assessment and Plan: 1. Hyperlipidemia, unspecified hyperlipidemia type Patient to continue Lipitor.  2. Obesity (BMI 30-39.9) Encouraged him to continue healthy eating habits and regular  exercise.  3. Prostate cancer [facility]) He can come to the lab to have the PSA drawn prior to his visit with the urologist - PSA  4. Abnormal LFTs - Hepatitis C Antibody - Hepatitis B surface antigen - Hepatitis B surface antibody,qualitative - Hepatic function panel  Follow Up Instructions: F/u in 2-3 mths  I discussed the assessment and treatment plan with the patient. The patient was provided an opportunity to ask questions and all were answered. The patient agreed with the plan and demonstrated an understanding of the instructions.   The patient was advised to call back or seek an in-person evaluation if the symptoms worsen or if the condition fails to improve as anticipated.  I provided 15 minutes of non-face-to-face time during this encounter.   Karle Plumber, MD

## 2018-12-13 ENCOUNTER — Other Ambulatory Visit: Payer: Self-pay

## 2018-12-13 ENCOUNTER — Ambulatory Visit: Payer: Self-pay | Attending: Internal Medicine

## 2018-12-14 ENCOUNTER — Ambulatory Visit: Payer: No Typology Code available for payment source | Admitting: Physical Therapy

## 2018-12-14 LAB — HEPATITIS C ANTIBODY: Hep C Virus Ab: <0.1 s/co ratio (ref 0.0–0.9)

## 2018-12-14 LAB — HEPATIC FUNCTION PANEL
ALT: 53 IU/L — ABNORMAL HIGH (ref 0–44)
AST: 29 IU/L (ref 0–40)
Albumin: 4.1 g/dL (ref 3.8–4.8)
Alkaline Phosphatase: 106 IU/L (ref 39–117)
Bilirubin Total: 0.3 mg/dL (ref 0.0–1.2)
Bilirubin, Direct: 0.09 mg/dL (ref 0.00–0.40)
Total Protein: 7.2 g/dL (ref 6.0–8.5)

## 2018-12-14 LAB — HEPATITIS B SURFACE ANTIGEN: Hepatitis B Surface Ag: NEGATIVE

## 2018-12-14 LAB — PSA: Prostate Specific Ag, Serum: <0.1 ng/mL (ref 0.0–4.0)

## 2018-12-14 LAB — HEPATITIS B SURFACE ANTIBODY,QUALITATIVE: Hep B Surface Ab, Qual: REACTIVE

## 2018-12-18 ENCOUNTER — Encounter: Payer: No Typology Code available for payment source | Admitting: Physical Therapy

## 2018-12-20 ENCOUNTER — Ambulatory Visit (INDEPENDENT_AMBULATORY_CARE_PROVIDER_SITE_OTHER): Payer: No Typology Code available for payment source | Admitting: Urology

## 2018-12-20 ENCOUNTER — Encounter: Payer: No Typology Code available for payment source | Admitting: Physical Therapy

## 2018-12-20 DIAGNOSIS — R351 Nocturia: Secondary | ICD-10-CM

## 2018-12-20 DIAGNOSIS — C61 Malignant neoplasm of prostate: Secondary | ICD-10-CM

## 2018-12-20 MED FILL — ALFUZOSIN HCL ER 10 MG TAB: 10 | 30 days supply | Qty: 30 | Fill #0

## 2019-01-05 MED FILL — TAMSULOSIN HCL 0.4 MG CAP: 0.4 | 30 days supply | Qty: 30 | Fill #3

## 2019-01-17 MED FILL — ?ATORVASTATIN 10 MG TABLET: 10 | 30 days supply | Qty: 30 | Fill #1

## 2019-02-08 MED FILL — TAMSULOSIN HCL 0.4 MG CAP: 0.4 | 30 days supply | Qty: 30 | Fill #4

## 2019-02-16 MED FILL — ?ATORVASTATIN 10 MG TABLET: 10 | 30 days supply | Qty: 30 | Fill #2

## 2019-03-07 ENCOUNTER — Other Ambulatory Visit: Payer: Self-pay

## 2019-03-07 ENCOUNTER — Ambulatory Visit: Payer: Self-pay | Attending: Orthopaedic Surgery | Admitting: Physical Therapy

## 2019-03-07 DIAGNOSIS — G8929 Other chronic pain: Secondary | ICD-10-CM | POA: Insufficient documentation

## 2019-03-07 DIAGNOSIS — M6281 Muscle weakness (generalized): Secondary | ICD-10-CM | POA: Insufficient documentation

## 2019-03-07 DIAGNOSIS — M25612 Stiffness of left shoulder, not elsewhere classified: Secondary | ICD-10-CM | POA: Insufficient documentation

## 2019-03-07 DIAGNOSIS — M25611 Stiffness of right shoulder, not elsewhere classified: Secondary | ICD-10-CM | POA: Insufficient documentation

## 2019-03-07 DIAGNOSIS — M25512 Pain in left shoulder: Secondary | ICD-10-CM | POA: Insufficient documentation

## 2019-03-07 NOTE — Therapy (Signed)
[place], [place], [address] Phone: [phone]   Fax:  [phone]  Physical Therapy Treatment/ Re-eval   Patient Details  Name: Quintavious Rinck MRN: [id number] Date of Birth: [birth year] Referring Provider (PT): Dr Frankey Shown    Encounter Date: 03/07/2019  PT End of Session - 03/07/19 1248    Visit Number  1    Number of Visits  16    Date for PT Re-Evaluation  05/02/19    PT Start Time  1130    PT Stop Time  1215    PT Time Calculation (min)  45 min    Activity Tolerance  Patient tolerated treatment well    Behavior During Therapy  [facility] for tasks assessed/performed       Past Medical History:  Diagnosis Date  . Elevated PSA   . Left knee pain   . Low back pain   . Pre-diabetes   . Prostate cancer ([place])   . Rotator cuff disorder, right     Past Surgical History:  Procedure Laterality Date  . COLONOSCOPY    . PROSTATE BIOPSY    . PROSTATE BIOPSY    . RADIOACTIVE SEED IMPLANT N/A 01/02/2018   Procedure: RADIOACTIVE SEED IMPLANT/BRACHYTHERAPY IMPLANT;  Surgeon: Cleon Gustin, MD;  [facility];  Service: Urology;  Laterality: N/A;  . SHOULDER ARTHROSCOPY WITH ROTATOR CUFF REPAIR AND SUBACROMIAL DECOMPRESSION Right 08/05/2017   Procedure: RIGHT SHOULDER ARTHROSCOPY WITH ROTATOR CUFF REPAIR, DISTAL CLAVICLE EXCISION, DEBRIDEMENT AND SUBACROMIAL DECOMPRESSION;  Surgeon: Leandrew Koyanagi, MD;  Location: [place];  Service: Orthopedics;  Laterality: Right;  . SPACE OAR INSTILLATION N/A 01/02/2018   Procedure: SPACE OAR INSTILLATION;  Surgeon: Cleon Gustin, MD;  Location: [facility];  Service: Urology;  Laterality: N/A;    There were no vitals filed for this visit.  Subjective Assessment - 03/07/19 1033    Subjective  Patient reports the pain has been about the same since March. He has not been doing the exercises because he has been  having pain. He has not seen the doctor recently. He has stayed quarentiend since the begining of covid. He had an injection in February.    Currently in Pain?  Yes    Pain Score  4     Pain Location  Shoulder    Pain Orientation  Left    Pain Descriptors / Indicators  Aching    Pain Type  Chronic pain    Pain Onset  More than a month ago    Pain Frequency  Intermittent    Aggravating Factors   use of the arm    Pain Relieving Factors  nothing really    Effect of Pain on Daily Activities  difficulty moving his shoulder         [facility] PT Assessment - 03/07/19 0001      AROM   Right Shoulder Flexion  68 Degrees    Right Shoulder Internal Rotation  --   can reach to upper gluteal with pain    Right Shoulder External Rotation  --   painful reaching to his lower occiput      PROM   Left Shoulder Flexion  110 Degrees    Left Shoulder ABduction  80 Degrees    Left Shoulder Internal Rotation  80 Degrees    Left Shoulder External Rotation  45 Degrees      Strength   Left Shoulder Flexion  4/5    Left Shoulder ABduction  3+/5    Left Shoulder Internal Rotation  5/5    Left Shoulder External Rotation  4+/5      Palpation   Palpation comment  tender to palaption around the supraspinatus insertion.       Special Tests   Other special tests  [place] (+) Left ; speeds + left                    [facility] Adult PT Treatment/Exercise - 03/07/19 0001      Self-Care   Self-Care  Other Self-Care Comments    Other Self-Care Comments   reviewed HEP and symptom mangement at home through [facility] inerpreter       Manual Therapy   Manual Therapy  Passive ROM;Joint mobilization    Joint Mobilization  PA glide and inferio grade III and IV     Passive ROM  into flexion, abduction, and ER                PT Short Term Goals - 11/14/18 1344      PT SHORT TERM GOAL #1   Title  Patient will demosntrate full passive left shoulder flexion without pain     Time  4    Period   Weeks    Status  New    Target Date  12/12/18      PT SHORT TERM GOAL #2   Title  Patient will demostrate 5/5 gross left shoulder strength     Time  4    Period  Weeks    Status  New    Target Date  12/12/18      PT SHORT TERM GOAL #3   Title  Patient will improve active left shoulder flexion and abduction     Time  4    Period  Weeks    Status  New    Target Date  12/12/18        PT Long Term Goals - 11/14/18 1345      PT LONG TERM GOAL #1   Title  Patient will sleep through the night without pain     Time  8    Period  Weeks    Status  New    Target Date  01/09/19      PT LONG TERM GOAL #2   Title  Patient will return to tennis without left shoulder pain     Time  8    Period  Weeks    Status  New    Target Date  01/09/19      PT LONG TERM GOAL #3   Title  Patient will reach behind him in the car without pain with his left arm    Time  8    Period  Weeks    Status  New    Target Date  01/09/19      PT LONG TERM GOAL #4   Title  Patient will demonstrate a 33% limitation on FOTO     Time  8    Period  Weeks    Status  New    Target Date  06/24/20            Plan - 03/07/19 1053    Clinical Impression Statement  Patient is being seen for the first time since March following the covid-19 outbreak. He has been limited as to what he has been able to do at home because of  pain. His active and passive range of motion measurements have decreased. His strength has also decreased slighlty. Therapy reviewed his HEP to start with at home. He required cuing to not go back too far.He would benefit from further skilled therapy to improve range and functional use of his left shoulder. He enjoys paying tenis. Patient had improved motion with mobilizations. He was encouraged to maintain his motion at home but not to over-do it.    Examination-Activity Limitations  Caring for Others;Carry;Sleep;Hygiene/Grooming    Clinical Decision Making  Low    Rehab Potential  Good     PT Frequency  2x / week    PT Duration  6 weeks    PT Treatment/Interventions  ADLs/Self Care Home Management;Cryotherapy;Dentist;Therapeutic activities;Therapeutic exercise;Neuromuscular re-education;Patient/family education;Manual techniques;Passive range of motion;Dry needling;Taping    PT Next Visit Plan  manual therapy to improve motion, add pec stretch if able; add band IR/ER and maybe sidelying ER;     PT Home Exercise Plan  wand flexion; scap retraction; shoulder extension , wand ER    Consulted and Agree with Plan of Care  Patient       Patient will benefit from skilled therapeutic intervention in order to improve the following deficits and impairments:  Pain, Decreased activity tolerance, Decreased endurance, Decreased range of motion, Decreased strength, Impaired UE functional use, Increased muscle spasms  Visit Diagnosis: 1. Chronic left shoulder pain   2. Stiffness of left shoulder, not elsewhere classified   3. Muscle weakness (generalized)        Problem List Patient Active Problem List   Diagnosis Date Noted  . Chronic left shoulder pain 09/05/2018  . Hyperlipidemia 08/15/2018  . Obesity (BMI 30-39.9) 08/15/2018  . Skin tag 08/15/2018  . Malignant neoplasm of prostate ([place]) 10/13/2017  . Rotator cuff tear arthropathy of right shoulder 08/31/2017  . Personal history of gastric ulcer 02/09/2017    Carney Living  PT DPT  03/07/2019, 12:53 PM  [facility] [address] [place], [place], [address] Phone: [phone]   Fax:  [phone]  Name: Daryel Kenneth MRN: [id number] Date of Birth: 12-27-1955

## 2019-03-12 MED FILL — TAMSULOSIN HCL 0.4 MG CAP: 0.4 | 30 days supply | Qty: 30 | Fill #5

## 2019-03-15 ENCOUNTER — Encounter: Payer: Self-pay | Admitting: Internal Medicine

## 2019-03-15 ENCOUNTER — Other Ambulatory Visit: Payer: Self-pay

## 2019-03-15 ENCOUNTER — Ambulatory Visit: Payer: Self-pay | Attending: Internal Medicine | Admitting: Internal Medicine

## 2019-03-15 VITALS — BP 116/81 | HR 85 | Temp 98.7°F | Resp 18 | Ht 73.0 in | Wt 264.0 lb

## 2019-03-15 DIAGNOSIS — E785 Hyperlipidemia, unspecified: Secondary | ICD-10-CM

## 2019-03-15 DIAGNOSIS — E669 Obesity, unspecified: Secondary | ICD-10-CM

## 2019-03-15 NOTE — Patient Instructions (Signed)

## 2019-03-15 NOTE — Progress Notes (Signed)
Patient ID: Roy Reed, male    DOB: [birth year]  MRN: [id number]  CC: Follow-up   Subjective: Roy Reed is a 63 y.o. male who presents for chronic disease management His concerns today include:  Pt with hx of pre-DM, rotator cuff tear s/p RT shoulder arthroscopy andprostate CA (hormone and radiation seeds).   HL: He is taking and tolerating atorvastatin.  Last LFTs showed a reduction in ALT level  Pre-DM/Obesity:  Gained 15 lbs since last in-person visit in 08/2018.  Not getting out to walk any more since COVID pandemic -eating 2 x a day, BF and dinner.  Generally portion sizes are not too large.  Snacking on fruits and banana Drinks water, ginger root juice  Patient Active Problem List   Diagnosis Date Noted  . Chronic left shoulder pain 09/05/2018  . Hyperlipidemia 08/15/2018  . Obesity (BMI 30-39.9) 08/15/2018  . Skin tag 08/15/2018  . Malignant neoplasm of prostate ([place]) 10/13/2017  . Rotator cuff tear arthropathy of right shoulder 08/31/2017  . Personal history of gastric ulcer 02/09/2017     Current Outpatient Medications on File Prior to Visit  Medication Sig Dispense Refill  . atorvastatin (LIPITOR) 10 MG tablet TAKE 1 TABLET BY MOUTH DAILY. 90 tablet 0  . megestrol (MEGACE) 20 MG tablet Take 20 mg by mouth 2 (two) times daily as needed.  1  . tamsulosin (FLOMAX) 0.4 MG CAPS capsule TAKE 1 CAPUSLE BY MOUTH TWICE DAILY WITH A MEAL 60 capsule 2   No current facility-administered medications on file prior to visit.     Allergies  Allergen Reactions  . Aspirin     States he can take if he takes acid reducer medicine first    Social History   Socioeconomic History  . Marital status: Married    Spouse name: Not on file  . Number of children: Not on file  . Years of education: Not on file  . Highest education level: Not on file  Occupational History  . Not on file  Social Needs  . Financial resource strain: Not on file  . Food insecurity   Worry: Not on file    Inability: Not on file  . Transportation needs    Medical: Not on file    Non-medical: Not on file  Tobacco Use  . Smoking status: Never Smoker  . Smokeless tobacco: Never Used  Substance and Sexual Activity  . Alcohol use: Not Currently  . Drug use: No  . Sexual activity: Not on file  Lifestyle  . Physical activity    Days per week: Not on file    Minutes per session: Not on file  . Stress: Not on file  Relationships  . Social Herbalist on phone: Not on file    Gets together: Not on file    Attends religious service: Not on file    Active member of club or organization: Not on file    Attends meetings of clubs or organizations: Not on file    Relationship status: Not on file  . Intimate partner violence    Fear of current or ex partner: Not on file    Emotionally abused: Not on file    Physically abused: Not on file    Forced sexual activity: Not on file  Other Topics Concern  . Not on file  Social History Narrative  . Not on file    History reviewed. No pertinent family history.  Past Surgical History:  Procedure Laterality Date  . COLONOSCOPY    . PROSTATE BIOPSY    . PROSTATE BIOPSY    . RADIOACTIVE SEED IMPLANT N/A 01/02/2018   Procedure: RADIOACTIVE SEED IMPLANT/BRACHYTHERAPY IMPLANT;  Surgeon: Cleon Gustin, MD;  Location: [facility];  Service: Urology;  Laterality: N/A;  . SHOULDER ARTHROSCOPY WITH ROTATOR CUFF REPAIR AND SUBACROMIAL DECOMPRESSION Right 08/05/2017   Procedure: RIGHT SHOULDER ARTHROSCOPY WITH ROTATOR CUFF REPAIR, DISTAL CLAVICLE EXCISION, DEBRIDEMENT AND SUBACROMIAL DECOMPRESSION;  Surgeon: Leandrew Koyanagi, MD;  Location: [place];  Service: Orthopedics;  Laterality: Right;  . SPACE OAR INSTILLATION N/A 01/02/2018   Procedure: SPACE OAR INSTILLATION;  Surgeon: Cleon Gustin, MD;  Location: [facility];  Service: Urology;  Laterality: N/A;    ROS: Review  of Systems Negative except as stated above  PHYSICAL EXAM: BP 116/81 (BP Location: Left Arm, Patient Position: Sitting, Cuff Size: Normal)   Pulse 85   Temp 98.7 F (37.1 C) (Oral)   Resp 18   Ht 6\' 1"  (1.854 m)   Wt 264 lb (119.7 kg)   SpO2 98%   BMI 34.83 kg/m   Wt Readings from Last 3 Encounters:  03/15/19 264 lb (119.7 kg)  08/14/18 249 lb 9.6 oz (113.2 kg)  06/28/18 262 lb 6.4 oz (119 kg)   Physical Exam General appearance - alert, well appearing, and in no distress Mental status - normal mood, behavior, speech, dress, motor activity, and thought processes Neck - supple, no significant adenopathy Chest - clear to auscultation, no wheezes, rales or rhonchi, symmetric air entry Heart - normal rate, regular rhythm, normal S1, S2, no murmurs, rubs, clicks or gallops Extremities - peripheral pulses normal, no pedal edema, no clubbing or cyanosis  CMP Latest Ref Rng & Units 12/13/2018 08/14/2018 04/07/2018  Glucose 65 - 99 mg/dL - - -  BUN 6 - 20 mg/dL - - -  Creatinine 0.61 - 1.24 mg/dL - - -  Sodium 135 - 145 mmol/L - - -  Potassium 3.5 - 5.1 mmol/L - - -  Chloride 101 - 111 mmol/L - - -  CO2 22 - 32 mmol/L - - -  Calcium 8.9 - 10.3 mg/dL - - -  Total Protein 6.0 - 8.5 g/dL 7.2 7.5 7.8  Total Bilirubin 0.0 - 1.2 mg/dL 0.3 0.3 0.2  Alkaline Phos 39 - 117 IU/L 106 118(H) 93  AST 0 - 40 IU/L 29 38 24  ALT 0 - 44 IU/L 53(H) 94(H) 52(H)   Lipid Panel     Component Value Date/Time   CHOL 186 08/14/2018 1625   TRIG 87 08/14/2018 1625   HDL 63 08/14/2018 1625   CHOLHDL 3.0 08/14/2018 1625   LDLCALC 106 (H) 08/14/2018 1625    CBC    Component Value Date/Time   WBC 5.2 12/26/2017 0853   RBC 4.77 12/26/2017 0853   HGB 12.9 (L) 12/26/2017 0853   HGB 13.9 02/28/2017 1651   HCT 39.5 12/26/2017 0853   HCT 41.4 02/28/2017 1651   PLT 226 12/26/2017 0853   PLT 263 02/28/2017 1651   MCV 82.8 12/26/2017 0853   MCV 82 02/28/2017 1651   MCH 27.0 12/26/2017 0853   MCHC 32.7  12/26/2017 0853   RDW 12.9 12/26/2017 0853   RDW 13.6 02/28/2017 1651   LYMPHSABS 2.6 02/28/2017 1651   EOSABS 0.0 02/28/2017 1651   BASOSABS 0.0 02/28/2017 1651    ASSESSMENT AND PLAN: 1. Obesity (BMI 30-39.9) -Advised patient that  he can get out to walk and I encouraged him to do so.  I recommend 150 minutes total of moderate intensity exercise per week.  Advised patient that he can go outdoors to walk as long as he maintains social distancing.  Healthy eating habits discussed and encouraged.  Printed information given.  2. Hyperlipidemia, unspecified hyperlipidemia type Continue Lipitor  Patient was given the opportunity to ask questions.  Patient verbalized understanding of the plan and was able to repeat key elements of the plan.  [employer] interpreter used during this encounter. #[id number]  No orders of the defined types were placed in this encounter.    Requested Prescriptions    No prescriptions requested or ordered in this encounter    No follow-ups on file.  Karle Plumber, MD, FACP

## 2019-03-16 ENCOUNTER — Encounter

## 2019-03-19 ENCOUNTER — Other Ambulatory Visit: Payer: Self-pay

## 2019-03-19 ENCOUNTER — Ambulatory Visit: Payer: Self-pay | Admitting: Physical Therapy

## 2019-03-19 DIAGNOSIS — M6281 Muscle weakness (generalized): Secondary | ICD-10-CM

## 2019-03-19 DIAGNOSIS — M25512 Pain in left shoulder: Secondary | ICD-10-CM

## 2019-03-19 DIAGNOSIS — M25612 Stiffness of left shoulder, not elsewhere classified: Secondary | ICD-10-CM

## 2019-03-19 DIAGNOSIS — G8929 Other chronic pain: Secondary | ICD-10-CM

## 2019-03-19 NOTE — Therapy (Signed)
[place], [place], [address] Phone: [phone]   Fax:  [phone]  Physical Therapy Treatment  Patient Details  Name: Roy Reed MRN: [id number] Date of Birth: [birth year] Referring Provider (PT): Dr Frankey Shown    Encounter Date: 03/19/2019  PT End of Session - 03/19/19 0856    Visit Number  2    Number of Visits  16    Date for PT Re-Evaluation  05/02/19    Authorization Type  CAFA     PT Start Time  0830    PT Stop Time  0913    PT Time Calculation (min)  43 min    Activity Tolerance  Patient tolerated treatment well    Behavior During Therapy  [facility] for tasks assessed/performed       Past Medical History:  Diagnosis Date  . Elevated PSA   . Left knee pain   . Low back pain   . Pre-diabetes   . Prostate cancer ([place])   . Rotator cuff disorder, right     Past Surgical History:  Procedure Laterality Date  . COLONOSCOPY    . PROSTATE BIOPSY    . PROSTATE BIOPSY    . RADIOACTIVE SEED IMPLANT N/A 01/02/2018   Procedure: RADIOACTIVE SEED IMPLANT/BRACHYTHERAPY IMPLANT;  Surgeon: Cleon Gustin, MD;  Location: [facility];  Service: Urology;  Laterality: N/A;  . SHOULDER ARTHROSCOPY WITH ROTATOR CUFF REPAIR AND SUBACROMIAL DECOMPRESSION Right 08/05/2017   Procedure: RIGHT SHOULDER ARTHROSCOPY WITH ROTATOR CUFF REPAIR, DISTAL CLAVICLE EXCISION, DEBRIDEMENT AND SUBACROMIAL DECOMPRESSION;  Surgeon: Leandrew Koyanagi, MD;  Location: [place];  Service: Orthopedics;  Laterality: Right;  . SPACE OAR INSTILLATION N/A 01/02/2018   Procedure: SPACE OAR INSTILLATION;  Surgeon: Cleon Gustin, MD;  Location[facility];  Service: Urology;  Laterality: N/A;    There were no vitals filed for this visit.  Subjective Assessment - 03/19/19 0831    Subjective  Patient reports his shoulder is feeling better. He is still having toruble with large movements.     Limitations  Lifting;Other (comment)    Diagnostic tests  MRI: No tear but tendinitis of suprapsinatus and ifraspinatus; also inflammation of the bicpes     Currently in Pain?  Yes    Pain Score  4     Pain Location  Shoulder    Pain Orientation  Left    Pain Descriptors / Indicators  Aching    Pain Type  Acute pain    Pain Onset  More than a month ago    Pain Frequency  Intermittent    Multiple Pain Sites  No         [facility] PT Assessment - 03/19/19 0001      PROM   Left Shoulder Flexion  145 Degrees    Left Shoulder External Rotation  53 Degrees                   [facility] Adult PT Treatment/Exercise - 03/19/19 0001      Shoulder Exercises: Supine   Other Supine Exercises  wand flex x10; Supine rythmic stabilization 2x30 sec holld with flexion and er     Other Supine Exercises  wand ER x10       Shoulder Exercises: Sidelying   Other Sidelying Exercises  sidelying ER 2x10       Shoulder Exercises: Standing   Internal Rotation  20 reps    Theraband Level (Shoulder  Internal Rotation)  Level 3 (Green)    Extension  20 reps    Extension Limitations  [employer]  20 reps    Row Limitations  green       Manual Therapy   Manual Therapy  Passive ROM;Joint mobilization    Manual therapy comments  soft tissue mobilization to upper traps     Joint Mobilization  PA glide and inferio grade III and IV     Passive ROM  into flexion, abduction, and ER              PT Education - 03/19/19 0855    Education Details  reviewed activity progression    Person(s) Educated  Patient    Methods  Explanation;Demonstration;Tactile cues;Verbal cues    Comprehension  Verbalized understanding;Returned demonstration;Verbal cues required;Tactile cues required       PT Short Term Goals - 03/19/19 0905      PT SHORT TERM GOAL #1   Title  Patient will demosntrate full passive left shoulder flexion without pain     Baseline  improved motion today    Time  4    Period  Weeks     Status  On-going      PT SHORT TERM GOAL #2   Title  Patient will demostrate 5/5 gross left shoulder strength     Baseline  140    Time  4    Period  Weeks    Status  On-going      PT SHORT TERM GOAL #3   Title  Patient will improve active left shoulder flexion and abduction     Baseline  perfroming independently     Time  4    Period  Weeks    Status  On-going        PT Long Term Goals - 11/14/18 1345      PT LONG TERM GOAL #1   Title  Patient will sleep through the night without pain     Time  8    Period  Weeks    Status  New    Target Date  01/09/19      PT LONG TERM GOAL #2   Title  Patient will return to tennis without left shoulder pain     Time  8    Period  Weeks    Status  New    Target Date  01/09/19      PT LONG TERM GOAL #3   Title  Patient will reach behind him in the car without pain with his left arm    Time  8    Period  Weeks    Status  New    Target Date  01/09/19      PT LONG TERM GOAL #4   Title  Patient will demonstrate a 33% limitation on FOTO     Time  8    Period  Weeks    Status  New    Target Date  06/24/20            Plan - 03/19/19 0857    Clinical Impression Statement  Patient made excellent gains for one visit. His flexion improved about 30 degrees and his ER about 8 degrees. He tolerated ther-ex today. The patient is very good about doing his exercises at home. He was encouraged to continue working on his exercises and stretches. Therapy advanced him to green bands for all band exericses except ER.  Comorbidities  low back pain. Right shoulder RTC repai 2019.     Examination-Activity Limitations  Caring for Others;Carry;Sleep;Hygiene/Grooming    Examination-Participation Restrictions  Cleaning;Community Activity;Yard Work    Stability/Clinical Decision Making  Stable/Uncomplicated    Clinical Decision Making  Moderate    Rehab Potential  Fair    PT Frequency  2x / week    PT Duration  6 weeks    PT  Treatment/Interventions  ADLs/Self Care Home Management;Cryotherapy;Dentist;Therapeutic activities;Therapeutic exercise;Neuromuscular re-education;Patient/family education;Manual techniques;Passive range of motion;Dry needling;Taping    PT Next Visit Plan  manual therapy to improve motion, add pec stretch if able; add band IR/ER and maybe sidelying ER;     PT Home Exercise Plan  wand flexion; scap retraction; shoulder extension , wand ER    Consulted and Agree with Plan of Care  Patient       Patient will benefit from skilled therapeutic intervention in order to improve the following deficits and impairments:  Pain, Decreased activity tolerance, Decreased endurance, Decreased range of motion, Decreased strength, Impaired UE functional use, Increased muscle spasms  Visit Diagnosis: 1. Stiffness of left shoulder, not elsewhere classified   2. Muscle weakness (generalized)   3. Chronic left shoulder pain        Problem List Patient Active Problem List   Diagnosis Date Noted  . Chronic left shoulder pain 09/05/2018  . Hyperlipidemia 08/15/2018  . Obesity (BMI 30-39.9) 08/15/2018  . Skin tag 08/15/2018  . Malignant neoplasm of prostate ([place]) 10/13/2017  . Rotator cuff tear arthropathy of right shoulder 08/31/2017  . Personal history of gastric ulcer 02/09/2017    Carney Living PT DPT  03/19/2019, 10:21 AM  [facility] [address] [place], [place], [address] Phone: [phone]   Fax:  [phone]  Name: Novak Stgermaine MRN: [id number] Date of Birth: [birth year]

## 2019-03-21 ENCOUNTER — Ambulatory Visit: Payer: Self-pay | Attending: Internal Medicine

## 2019-03-21 ENCOUNTER — Ambulatory Visit: Payer: Self-pay | Admitting: Physical Therapy

## 2019-03-21 ENCOUNTER — Encounter: Payer: Self-pay | Admitting: Physical Therapy

## 2019-03-21 ENCOUNTER — Other Ambulatory Visit: Payer: Self-pay

## 2019-03-21 DIAGNOSIS — M6281 Muscle weakness (generalized): Secondary | ICD-10-CM

## 2019-03-21 DIAGNOSIS — G8929 Other chronic pain: Secondary | ICD-10-CM

## 2019-03-21 DIAGNOSIS — M25512 Pain in left shoulder: Secondary | ICD-10-CM

## 2019-03-21 DIAGNOSIS — M25612 Stiffness of left shoulder, not elsewhere classified: Secondary | ICD-10-CM

## 2019-03-21 NOTE — Therapy (Signed)
[place], [place], [address] Phone: [phone]   Fax:  [phone]  Physical Therapy Treatment  Patient Details  Name: Roy Reed MRN: [id number] Date of Birth: Nov 02, 1955 Referring Provider (PT): Dr Frankey Shown    Encounter Date: 03/21/2019  PT End of Session - 03/21/19 0853    Visit Number  3    Number of Visits  16    Date for PT Re-Evaluation  05/02/19    Authorization Type  CAFA     PT Start Time  0831    PT Stop Time  0912    PT Time Calculation (min)  41 min    Activity Tolerance  Patient tolerated treatment well    Behavior During Therapy  [facility] for tasks assessed/performed       Past Medical History:  Diagnosis Date  . Elevated PSA   . Left knee pain   . Low back pain   . Pre-diabetes   . Prostate cancer ([place])   . Rotator cuff disorder, right     Past Surgical History:  Procedure Laterality Date  . COLONOSCOPY    . PROSTATE BIOPSY    . PROSTATE BIOPSY    . RADIOACTIVE SEED IMPLANT N/A 01/02/2018   Procedure: RADIOACTIVE SEED IMPLANT/BRACHYTHERAPY IMPLANT;  Surgeon: Cleon Gustin, MD;  Location: [facility];  Service: Urology;  Laterality: N/A;  . SHOULDER ARTHROSCOPY WITH ROTATOR CUFF REPAIR AND SUBACROMIAL DECOMPRESSION Right 08/05/2017   Procedure: RIGHT SHOULDER ARTHROSCOPY WITH ROTATOR CUFF REPAIR, DISTAL CLAVICLE EXCISION, DEBRIDEMENT AND SUBACROMIAL DECOMPRESSION;  Surgeon: Leandrew Koyanagi, MD;  Location: [place];  Service: Orthopedics;  Laterality: Right;  . SPACE OAR INSTILLATION N/A 01/02/2018   Procedure: SPACE OAR INSTILLATION;  Surgeon: Cleon Gustin, MD;  Location: [facility];  Service: Urology;  Laterality: N/A;    There were no vitals filed for this visit.  Subjective Assessment - 03/21/19 0837    Subjective  Patient has a 6/10 pain at this time with movement. He feels like it has gotten a little better but  it is still painful.    Patient is accompained by:  Interpreter    Diagnostic tests  MRI: No tear but tendinitis of suprapsinatus and ifraspinatus; also inflammation of the bicpes     Currently in Pain?  Yes    Pain Score  6     Pain Location  Shoulder    Pain Orientation  Left    Pain Descriptors / Indicators  Aching    Pain Type  Chronic pain    Pain Onset  More than a month ago    Pain Frequency  Intermittent    Aggravating Factors   using the arm    Pain Relieving Factors  rest    Effect of Pain on Daily Activities  difficulty using his left arm    Multiple Pain Sites  No                       [facility] Adult PT Treatment/Exercise - 03/21/19 0001      Shoulder Exercises: Supine   Other Supine Exercises  wand flex x10; Supine rythmic stabilization 2x30 sec holld with flexion and er     Other Supine Exercises  wand ER x10       Shoulder Exercises: Sidelying   Other Sidelying Exercises  sidelying ER 2x10       Shoulder Exercises: Standing   Internal  Rotation  20 reps    Theraband Level (Shoulder Internal Rotation)  Level 3 (Green)    Extension  20 reps    Extension Limitations  [employer]  20 reps    Row Limitations  green     Other Standing Exercises  UE rangr into flexion x20       Shoulder Exercises: Pulleys   Flexion Limitations  2 min       Manual Therapy   Manual Therapy  Passive ROM;Joint mobilization    Manual therapy comments  soft tissue mobilization to upper traps     Joint Mobilization  PA glide and inferio grade III and IV, sidelying scapular mobilization with flexion x15 mod verbal cuing for straight flexion    Passive ROM  into flexion, abduction, and ER              PT Education - 03/21/19 0853    Education Details  reviewed technique with ther-ex    Person(s) Educated  Patient    Methods  Explanation;Demonstration;Tactile cues;Verbal cues    Comprehension  Verbalized understanding;Returned demonstration       PT Short Term  Goals - 03/19/19 0905      PT SHORT TERM GOAL #1   Title  Patient will demosntrate full passive left shoulder flexion without pain     Baseline  improved motion today    Time  4    Period  Weeks    Status  On-going      PT SHORT TERM GOAL #2   Title  Patient will demostrate 5/5 gross left shoulder strength     Baseline  140    Time  4    Period  Weeks    Status  On-going      PT SHORT TERM GOAL #3   Title  Patient will improve active left shoulder flexion and abduction     Baseline  perfroming independently     Time  4    Period  Weeks    Status  On-going        PT Long Term Goals - 11/14/18 1345      PT LONG TERM GOAL #1   Title  Patient will sleep through the night without pain     Time  8    Period  Weeks    Status  New    Target Date  01/09/19      PT LONG TERM GOAL #2   Title  Patient will return to tennis without left shoulder pain     Time  8    Period  Weeks    Status  New    Target Date  01/09/19      PT LONG TERM GOAL #3   Title  Patient will reach behind him in the car without pain with his left arm    Time  8    Period  Weeks    Status  New    Target Date  01/09/19      PT LONG TERM GOAL #4   Title  Patient will demonstrate a 33% limitation on FOTO     Time  8    Period  Weeks    Status  New    Target Date  06/24/20            Plan - 03/21/19 0854    Clinical Impression Statement  Patient is making slow but steady progress with regaurds to ROM. He  is having less pain at end range. Therapy added in side lying flexion with scpaular mobilization. He can get further into flexion with assisted scapular movement.    Comorbidities  low back pain. Right shoulder RTC repai 2019.     Examination-Activity Limitations  Caring for Others;Carry;Sleep;Hygiene/Grooming    Clinical Decision Making  Low    Rehab Potential  Good    PT Frequency  2x / week    PT Duration  6 weeks    PT Treatment/Interventions  ADLs/Self Care Home  Management;Cryotherapy;Dentist;Therapeutic activities;Therapeutic exercise;Neuromuscular re-education;Patient/family education;Manual techniques;Passive range of motion;Dry needling;Taping    PT Next Visit Plan  asees tolerance to previous visit    PT Home Exercise Plan  wand flexion; scap retraction; shoulder extension , wand ER    Consulted and Agree with Plan of Care  Patient       Patient will benefit from skilled therapeutic intervention in order to improve the following deficits and impairments:  Pain, Decreased activity tolerance, Decreased endurance, Decreased range of motion, Decreased strength, Impaired UE functional use, Increased muscle spasms  Visit Diagnosis: 1. Stiffness of left shoulder, not elsewhere classified   2. Muscle weakness (generalized)   3. Chronic left shoulder pain        Problem List Patient Active Problem List   Diagnosis Date Noted  . Chronic left shoulder pain 09/05/2018  . Hyperlipidemia 08/15/2018  . Obesity (BMI 30-39.9) 08/15/2018  . Skin tag 08/15/2018  . Malignant neoplasm of prostate ([place]) 10/13/2017  . Rotator cuff tear arthropathy of right shoulder 08/31/2017  . Personal history of gastric ulcer 02/09/2017    Carney Living PT DPT  03/21/2019, 11:59 AM  [facility] [address] [place], [place], [address] Phone: [phone]   Fax:  [phone]  Name: Roy Reed MRN: [id number] Date of Birth: [birth year]

## 2019-03-23 ENCOUNTER — Other Ambulatory Visit: Payer: Self-pay | Admitting: Internal Medicine

## 2019-03-26 ENCOUNTER — Other Ambulatory Visit: Payer: Self-pay

## 2019-03-26 ENCOUNTER — Ambulatory Visit: Payer: Self-pay | Admitting: Physical Therapy

## 2019-03-26 ENCOUNTER — Encounter: Payer: Self-pay | Admitting: Physical Therapy

## 2019-03-26 DIAGNOSIS — M25512 Pain in left shoulder: Secondary | ICD-10-CM

## 2019-03-26 DIAGNOSIS — M6281 Muscle weakness (generalized): Secondary | ICD-10-CM

## 2019-03-26 DIAGNOSIS — G8929 Other chronic pain: Secondary | ICD-10-CM

## 2019-03-26 DIAGNOSIS — M25612 Stiffness of left shoulder, not elsewhere classified: Secondary | ICD-10-CM

## 2019-03-26 MED FILL — ?ATORVASTATIN 10 MG TABLET: 10 | 30 days supply | Qty: 30 | Fill #0

## 2019-03-26 NOTE — Therapy (Signed)
[place], [place], [address] Phone: [phone]   Fax:  [phone]  Physical Therapy Treatment  Patient Details  Name: Roy Reed MRN: [id number] Date of Birth: Oct 06, 1955 Referring Provider (PT): Dr Frankey Shown    Encounter Date: 03/26/2019  PT End of Session - 03/26/19 0945    Visit Number  4    Number of Visits  16    Date for PT Re-Evaluation  05/02/19    Authorization Type  CAFA     PT Start Time  0830    PT Stop Time  0912    PT Time Calculation (min)  42 min    Activity Tolerance  Patient tolerated treatment well    Behavior During Therapy  [facility] for tasks assessed/performed       Past Medical History:  Diagnosis Date  . Elevated PSA   . Left knee pain   . Low back pain   . Pre-diabetes   . Prostate cancer ([place])   . Rotator cuff disorder, right     Past Surgical History:  Procedure Laterality Date  . COLONOSCOPY    . PROSTATE BIOPSY    . PROSTATE BIOPSY    . RADIOACTIVE SEED IMPLANT N/A 01/02/2018   Procedure: RADIOACTIVE SEED IMPLANT/BRACHYTHERAPY IMPLANT;  Surgeon: Cleon Gustin, MD;  Location: [facility];  Service: Urology;  Laterality: N/A;  . SHOULDER ARTHROSCOPY WITH ROTATOR CUFF REPAIR AND SUBACROMIAL DECOMPRESSION Right 08/05/2017   Procedure: RIGHT SHOULDER ARTHROSCOPY WITH ROTATOR CUFF REPAIR, DISTAL CLAVICLE EXCISION, DEBRIDEMENT AND SUBACROMIAL DECOMPRESSION;  Surgeon: Leandrew Koyanagi, MD;  Location: [place];  Service: Orthopedics;  Laterality: Right;  . SPACE OAR INSTILLATION N/A 01/02/2018   Procedure: SPACE OAR INSTILLATION;  Surgeon: Cleon Gustin, MD;  Location: [facility];  Service: Urology;  Laterality: N/A;    There were no vitals filed for this visit.  Subjective Assessment - 03/26/19 0839    Subjective  Patient slept on his shoulder and had a lot of pain in the morning. His shoulder is sore this  morning but a little better. His pain today is a 2-3/10    Limitations  Lifting;Other (comment)    Currently in Pain?  Yes    Pain Score  3     Pain Location  Shoulder    Pain Orientation  Left    Pain Descriptors / Indicators  Aching    Pain Type  Chronic pain    Pain Onset  More than a month ago    Pain Frequency  Intermittent    Aggravating Factors   using the arm    Pain Relieving Factors  rest    Effect of Pain on Daily Activities  difficulty using the arm    Multiple Pain Sites  No         [facility] PT Assessment - 03/26/19 0001      PROM   Left Shoulder Flexion  150 Degrees    Left Shoulder External Rotation  55 Degrees                   [facility] Adult PT Treatment/Exercise - 03/26/19 0001      Shoulder Exercises: Supine   Other Supine Exercises  weight in mid range x15 fwd/back and side to side       Shoulder Exercises: Sidelying   Other Sidelying Exercises  sidelying ER 2x10       Shoulder Exercises: Standing  Internal Rotation  20 reps    Theraband Level (Shoulder Internal Rotation)  Level 3 (Green)    Extension  20 reps    Extension Limitations  [employer]  20 reps    Row Limitations  green     Other Standing Exercises  UE rangr into flexion x20       Shoulder Exercises: Pulleys   Flexion  2 minutes    Scaption  2 minutes   more pain, mod cuing to stay out of pain free range      Manual Therapy   Manual Therapy  Passive ROM;Joint mobilization    Manual therapy comments  soft tissue mobilization to upper traps     Joint Mobilization  PA glide and inferio grade III and IV, sidelying scapular mobilization with flexion x15 mod verbal cuing for straight flexion    Passive ROM  into flexion, abduction, and ER              PT Education - 03/26/19 0842    Education Details  HEP, symptom mangement    Person(s) Educated  Patient    Methods  Explanation;Demonstration;Verbal cues;Tactile cues    Comprehension  Verbalized understanding;Returned  demonstration;Verbal cues required;Tactile cues required       PT Short Term Goals - 03/26/19 4627      PT SHORT TERM GOAL #1   Title  Patient will demosntrate full passive left shoulder flexion without pain     Baseline  improved motion today    Time  4    Period  Weeks    Status  On-going      PT SHORT TERM GOAL #2   Title  Patient will demostrate 5/5 gross left shoulder strength     Baseline  140    Time  4    Period  Weeks    Status  On-going      PT SHORT TERM GOAL #3   Title  Patient will improve active left shoulder flexion and abduction     Baseline  perfroming independently     Time  4    Period  Weeks    Status  On-going    Target Date  12/12/18      PT SHORT TERM GOAL #4   Title  Patient will demostrate 3/5 right shoulder flexion strength below 90 degrees     Baseline  4/5 to 120 dgrees     Time  4    Period  Weeks    Status  Achieved        PT Long Term Goals - 11/14/18 1345      PT LONG TERM GOAL #1   Title  Patient will sleep through the night without pain     Time  8    Period  Weeks    Status  New    Target Date  01/09/19      PT LONG TERM GOAL #2   Title  Patient will return to tennis without left shoulder pain     Time  8    Period  Weeks    Status  New    Target Date  01/09/19      PT LONG TERM GOAL #3   Title  Patient will reach behind him in the car without pain with his left arm    Time  8    Period  Weeks    Status  New    Target Date  01/09/19      PT LONG TERM GOAL #4   Title  Patient will demonstrate a 33% limitation on FOTO     Time  8    Period  Weeks    Status  New    Target Date  06/24/20            Plan - 03/26/19 0950    Clinical Impression Statement  Patient had increased pain with exercises today. Therapy advanced him slightly. He was advised to go home and ice and to make sure he stays loose. He had a 2 degree improvement in ER. He slept on his arm last night and thathas made it sore. He was advised not to  sleep on it.    Comorbidities  low back pain. Right shoulder RTC repai 2019.     Examination-Activity Limitations  Caring for Others;Carry;Sleep;Hygiene/Grooming    Examination-Participation Restrictions  Cleaning;Community Activity;Yard Work    Stability/Clinical Decision Making  Stable/Uncomplicated    Clinical Decision Making  Low    Rehab Potential  Good    PT Frequency  2x / week    PT Duration  6 weeks    PT Treatment/Interventions  ADLs/Self Care Home Management;Cryotherapy;Dentist;Therapeutic activities;Therapeutic exercise;Neuromuscular re-education;Patient/family education;Manual techniques;Passive range of motion;Dry needling;Taping    PT Next Visit Plan  asees tolerance to previous visit; if patient continues to jave increased soreness consider ionto or ultrasound    PT Home Exercise Plan  wand flexion; scap retraction; shoulder extension , wand ER    Consulted and Agree with Plan of Care  Patient       Patient will benefit from skilled therapeutic intervention in order to improve the following deficits and impairments:  Pain, Decreased activity tolerance, Decreased endurance, Decreased range of motion, Decreased strength, Impaired UE functional use, Increased muscle spasms  Visit Diagnosis: 1. Stiffness of left shoulder, not elsewhere classified   2. Muscle weakness (generalized)   3. Chronic left shoulder pain        Problem List Patient Active Problem List   Diagnosis Date Noted  . Chronic left shoulder pain 09/05/2018  . Hyperlipidemia 08/15/2018  . Obesity (BMI 30-39.9) 08/15/2018  . Skin tag 08/15/2018  . Malignant neoplasm of prostate ([place]) 10/13/2017  . Rotator cuff tear arthropathy of right shoulder 08/31/2017  . Personal history of gastric ulcer 02/09/2017    Carney Living PT DPT  03/26/2019, 9:54 AM  [facility] [address] [place], [place],  [address] Phone: [phone]   Fax:  [phone]  Name: Roy Reed MRN: [id number] Date of Birth: 1955-11-22

## 2019-03-28 ENCOUNTER — Encounter: Payer: Self-pay | Admitting: Physical Therapy

## 2019-03-28 ENCOUNTER — Ambulatory Visit: Payer: Self-pay | Admitting: Physical Therapy

## 2019-03-28 ENCOUNTER — Other Ambulatory Visit: Payer: Self-pay

## 2019-03-28 DIAGNOSIS — M6281 Muscle weakness (generalized): Secondary | ICD-10-CM

## 2019-03-28 DIAGNOSIS — M25612 Stiffness of left shoulder, not elsewhere classified: Secondary | ICD-10-CM

## 2019-03-28 DIAGNOSIS — G8929 Other chronic pain: Secondary | ICD-10-CM

## 2019-03-28 NOTE — Therapy (Signed)
[place], [place], [address] Phone: [phone]   Fax:  [phone]  Physical Therapy Treatment  Patient Details  Name: Roy Reed MRN: [id number] Date of Birth: 07/13/1956 Referring Provider (PT): Dr Frankey Shown    Encounter Date: 03/28/2019  PT End of Session - 03/28/19 1242    Visit Number  6    Number of Visits  16    Date for PT Re-Evaluation  05/02/19    Authorization Type  CAFA     PT Start Time  0915    PT Stop Time  0958    PT Time Calculation (min)  43 min    Activity Tolerance  Patient tolerated treatment well    Behavior During Therapy  [facility] for tasks assessed/performed       Past Medical History:  Diagnosis Date  . Elevated PSA   . Left knee pain   . Low back pain   . Pre-diabetes   . Prostate cancer ([place])   . Rotator cuff disorder, right     Past Surgical History:  Procedure Laterality Date  . COLONOSCOPY    . PROSTATE BIOPSY    . PROSTATE BIOPSY    . RADIOACTIVE SEED IMPLANT N/A 01/02/2018   Procedure: RADIOACTIVE SEED IMPLANT/BRACHYTHERAPY IMPLANT;  Surgeon: Cleon Gustin, MD;  Location: [facility];  Service: Urology;  Laterality: N/A;  . SHOULDER ARTHROSCOPY WITH ROTATOR CUFF REPAIR AND SUBACROMIAL DECOMPRESSION Right 08/05/2017   Procedure: RIGHT SHOULDER ARTHROSCOPY WITH ROTATOR CUFF REPAIR, DISTAL CLAVICLE EXCISION, DEBRIDEMENT AND SUBACROMIAL DECOMPRESSION;  Surgeon: Leandrew Koyanagi, MD;  Location: [place];  Service: Orthopedics;  Laterality: Right;  . SPACE OAR INSTILLATION N/A 01/02/2018   Procedure: SPACE OAR INSTILLATION;  Surgeon: Cleon Gustin, MD;  Location: [facility];  Service: Urology;  Laterality: N/A;    There were no vitals filed for this visit.  Subjective Assessment - 03/28/19 0920    Subjective  Patient reports he has been having some pain in his clavicle when he is at rest. He does not have  as much pain today.    Limitations  Lifting;Other (comment)    Diagnostic tests  MRI: No tear but tendinitis of suprapsinatus and ifraspinatus; also inflammation of the bicpes     Currently in Pain?  Yes    Pain Score  4     Pain Location  Shoulder    Pain Orientation  Left    Pain Descriptors / Indicators  Aching    Pain Type  Chronic pain    Pain Onset  More than a month ago    Pain Frequency  Intermittent    Aggravating Factors   using the arm    Pain Relieving Factors  rest    Effect of Pain on Daily Activities  difficulty using the arm                       [facility] Adult PT Treatment/Exercise - 03/28/19 0001      Shoulder Exercises: Standing   Extension  20 reps    Extension Limitations  Green     Row  20 reps    Row Limitations  green     Other Standing Exercises  UE rangr into flexion x20     Other Standing Exercises  wall slide with towel       Shoulder Exercises: Pulleys   Flexion  2 minutes  Scaption  2 minutes   more pain, mod cuing to stay out of pain free range      Shoulder Exercises: Stretch   Other Shoulder Stretches  upper trap stretch 3x20 right; levator stretch 3x20 sec hold       Manual Therapy   Manual Therapy  Passive ROM;Joint mobilization;Soft tissue mobilization    Manual therapy comments  soft tissue mobilization to upper traps     Joint Mobilization  PA glide and inferio grade III and IV, sidelying scapular mobilization with flexion x15 mod verbal cuing for straight flexion    Soft tissue mobilization  trigger point release to upper traps; superior glide of distal clavilce.     Passive ROM  into flexion, abduction, and ER              PT Education - 03/28/19 1234    Education Details  reviewed HPE and symptom mangement    Person(s) Educated  Patient    Methods  Explanation;Demonstration;Tactile cues;Verbal cues    Comprehension  Verbalized understanding;Returned demonstration;Verbal cues required;Tactile cues required        PT Short Term Goals - 03/28/19 1243      PT SHORT TERM GOAL #1   Title  Patient will demosntrate full passive left shoulder flexion without pain     Baseline  improved motion today    Time  4    Period  Weeks    Status  On-going      PT SHORT TERM GOAL #2   Title  Patient will demostrate 5/5 gross left shoulder strength     Baseline  140    Time  4    Period  Weeks    Status  On-going    Target Date  12/12/18      PT SHORT TERM GOAL #3   Title  Patient will improve active left shoulder flexion and abduction     Baseline  perfroming independently     Time  4    Period  Weeks    Status  On-going      PT SHORT TERM GOAL #4   Title  Patient will demostrate 3/5 right shoulder flexion strength below 90 degrees     Baseline  4/5 to 120 dgrees     Time  4    Period  Weeks    Status  On-going        PT Long Term Goals - 11/14/18 1345      PT LONG TERM GOAL #1   Title  Patient will sleep through the night without pain     Time  8    Period  Weeks    Status  New    Target Date  01/09/19      PT LONG TERM GOAL #2   Title  Patient will return to tennis without left shoulder pain     Time  8    Period  Weeks    Status  New    Target Date  01/09/19      PT LONG TERM GOAL #3   Title  Patient will reach behind him in the car without pain with his left arm    Time  8    Period  Weeks    Status  New    Target Date  01/09/19      PT LONG TERM GOAL #4   Title  Patient will demonstrate a 33% limitation on FOTO     Time  8    Period  Weeks    Status  New    Target Date  06/24/20            Plan - 03/28/19 1236    Clinical Impression Statement  Patient was more sore in his upper trap tday. He did have spasming in his trap. he was given stretches and therapy worked on [employer]. His active motion is still limited. We would hope at this point that the patient would be a little further alonf then he is right now. We will give it another week or so and  see were we are at at that point. He was encouraged to continue with his HEP    Personal Factors and Comorbidities  Comorbidity 2    Comorbidities  low back pain. Right shoulder RTC repai 2019.     Examination-Activity Limitations  Caring for Others;Carry;Sleep;Hygiene/Grooming    Examination-Participation Restrictions  Cleaning;Community Activity;Yard Work    Stability/Clinical Decision Making  Stable/Uncomplicated    Clinical Decision Making  Low    Rehab Potential  Good    PT Frequency  2x / week    PT Duration  6 weeks    PT Treatment/Interventions  ADLs/Self Care Home Management;Cryotherapy;Dentist;Therapeutic activities;Therapeutic exercise;Neuromuscular re-education;Patient/family education;Manual techniques;Passive range of motion;Dry needling;Taping    PT Next Visit Plan  asees tolerance to previous visit; if patient continues to jave increased soreness consider ionto or ultrasound    PT Home Exercise Plan  wand flexion; scap retraction; shoulder extension , wand ER    Consulted and Agree with Plan of Care  Patient       Patient will benefit from skilled therapeutic intervention in order to improve the following deficits and impairments:     Visit Diagnosis: 1. Stiffness of left shoulder, not elsewhere classified   2. Muscle weakness (generalized)   3. Chronic left shoulder pain        Problem List Patient Active Problem List   Diagnosis Date Noted  . Chronic left shoulder pain 09/05/2018  . Hyperlipidemia 08/15/2018  . Obesity (BMI 30-39.9) 08/15/2018  . Skin tag 08/15/2018  . Malignant neoplasm of prostate ([place]) 10/13/2017  . Rotator cuff tear arthropathy of right shoulder 08/31/2017  . Personal history of gastric ulcer 02/09/2017    Carney Living 03/28/2019, 12:45 PM  [facility] [address] [place], [place], [address] Phone: [phone]   Fax:   [phone]  Name: Markus Casten MRN: [id number] Date of Birth: Sep 09, 1955

## 2019-04-02 ENCOUNTER — Ambulatory Visit: Payer: Self-pay | Admitting: Physical Therapy

## 2019-04-02 ENCOUNTER — Other Ambulatory Visit: Payer: Self-pay

## 2019-04-02 ENCOUNTER — Encounter: Payer: Self-pay | Admitting: Physical Therapy

## 2019-04-02 DIAGNOSIS — M25611 Stiffness of right shoulder, not elsewhere classified: Secondary | ICD-10-CM

## 2019-04-02 DIAGNOSIS — M6281 Muscle weakness (generalized): Secondary | ICD-10-CM

## 2019-04-02 DIAGNOSIS — M25512 Pain in left shoulder: Secondary | ICD-10-CM

## 2019-04-02 DIAGNOSIS — G8929 Other chronic pain: Secondary | ICD-10-CM

## 2019-04-02 DIAGNOSIS — M25612 Stiffness of left shoulder, not elsewhere classified: Secondary | ICD-10-CM

## 2019-04-02 NOTE — Therapy (Signed)
[place], [place], [address] Phone: [phone]   Fax:  [phone]  Physical Therapy Treatment  Patient Details  Name: Roy Reed MRN: [id number] Date of Birth: [birth year] Referring Provider (PT): Dr Frankey Shown    Encounter Date: 04/02/2019  PT End of Session - 04/02/19 0901    Visit Number  7    Number of Visits  16    Date for PT Re-Evaluation  05/02/19    Authorization Type  CAFA     PT Start Time  0830    PT Stop Time  0912    PT Time Calculation (min)  42 min    Activity Tolerance  Patient tolerated treatment well    Behavior During Therapy  [facility] for tasks assessed/performed       Past Medical History:  Diagnosis Date  . Elevated PSA   . Left knee pain   . Low back pain   . Pre-diabetes   . Prostate cancer ([place])   . Rotator cuff disorder, right     Past Surgical History:  Procedure Laterality Date  . COLONOSCOPY    . PROSTATE BIOPSY    . PROSTATE BIOPSY    . RADIOACTIVE SEED IMPLANT N/A 01/02/2018   Procedure: RADIOACTIVE SEED IMPLANT/BRACHYTHERAPY IMPLANT;  Surgeon: Cleon Gustin, MD;  Location: [facility];  Service: Urology;  Laterality: N/A;  . SHOULDER ARTHROSCOPY WITH ROTATOR CUFF REPAIR AND SUBACROMIAL DECOMPRESSION Right 08/05/2017   Procedure: RIGHT SHOULDER ARTHROSCOPY WITH ROTATOR CUFF REPAIR, DISTAL CLAVICLE EXCISION, DEBRIDEMENT AND SUBACROMIAL DECOMPRESSION;  Surgeon: Leandrew Koyanagi, MD;  Location: [place];  Service: Orthopedics;  Laterality: Right;  . SPACE OAR INSTILLATION N/A 01/02/2018   Procedure: SPACE OAR INSTILLATION;  Surgeon: Cleon Gustin, MD;  Location: [facility];  Service: Urology;  Laterality: N/A;    There were no vitals filed for this visit.  Subjective Assessment - 04/02/19 0841    Subjective  Patient had a good weekend. He feels like his shoulder is doing better. He has been working on his  exercises.    Patient is accompained by:  Interpreter    Limitations  Lifting;Other (comment)    Diagnostic tests  MRI: No tear but tendinitis of suprapsinatus and ifraspinatus; also inflammation of the bicpes     Currently in Pain?  Yes    Pain Score  2     Pain Location  Shoulder    Pain Orientation  Left    Pain Descriptors / Indicators  Aching    Pain Type  Chronic pain    Pain Onset  More than a month ago    Pain Frequency  Intermittent    Aggravating Factors   reaching    Pain Relieving Factors  rest    Effect of Pain on Daily Activities  difficulty using his left arm         [facility] PT Assessment - 04/02/19 0001      PROM   Left Shoulder External Rotation  58 Degrees                   [facility] Adult PT Treatment/Exercise - 04/02/19 0001      Shoulder Exercises: Supine   Other Supine Exercises  wand flexion 1lb 2x10       Shoulder Exercises: Standing   Internal Rotation  20 reps    Theraband Level (Shoulder Internal Rotation)  Level 3 (Green)    Extension  20 reps    Theraband Level (Shoulder Extension)  Level 3 (Green);Level 4 (Blue)    Row  20 reps    Theraband Level (Shoulder Row)  Level 3 (Green);Level 4 (Blue)    Other Standing Exercises  UE rangr into flexion x20     Other Standing Exercises  shelf reach 2nd shelf 3x6 ilb       Shoulder Exercises: Pulleys   Flexion  2 minutes      Modalities   Modalities  Ultrasound      Ultrasound   Ultrasound Location  left shoulder     Ultrasound Parameters  1.5 w/cm2  continuous 66mhz     Ultrasound Goals  Pain      Manual Therapy   Manual Therapy  Passive ROM;Joint mobilization;Soft tissue mobilization    Manual therapy comments  soft tissue mobilization to upper traps     Joint Mobilization  PA glide and inferio grade III and IV, sidelying scapular mobilization with flexion x15 mod verbal cuing for straight flexion    Soft tissue mobilization  trigger point release to upper traps; superior glide of distal  clavilce.     Passive ROM  into flexion, abduction, and ER              PT Education - 04/02/19 0859    Education Details  reviewed benefits and risks f ultrasound    Person(s) Educated  Patient    Methods  Demonstration;Tactile cues;Explanation;Verbal cues    Comprehension  Verbalized understanding;Returned demonstration;Verbal cues required;Tactile cues required       PT Short Term Goals - 03/28/19 1243      PT SHORT TERM GOAL #1   Title  Patient will demosntrate full passive left shoulder flexion without pain     Baseline  improved motion today    Time  4    Period  Weeks    Status  On-going      PT SHORT TERM GOAL #2   Title  Patient will demostrate 5/5 gross left shoulder strength     Baseline  140    Time  4    Period  Weeks    Status  On-going    Target Date  12/12/18      PT SHORT TERM GOAL #3   Title  Patient will improve active left shoulder flexion and abduction     Baseline  perfroming independently     Time  4    Period  Weeks    Status  On-going      PT SHORT TERM GOAL #4   Title  Patient will demostrate 3/5 right shoulder flexion strength below 90 degrees     Baseline  4/5 to 120 dgrees     Time  4    Period  Weeks    Status  On-going        PT Long Term Goals - 11/14/18 1345      PT LONG TERM GOAL #1   Title  Patient will sleep through the night without pain     Time  8    Period  Weeks    Status  New    Target Date  01/09/19      PT LONG TERM GOAL #2   Title  Patient will return to tennis without left shoulder pain     Time  8    Period  Weeks    Status  New    Target Date  01/09/19  PT LONG TERM GOAL #3   Title  Patient will reach behind him in the car without pain with his left arm    Time  8    Period  Weeks    Status  New    Target Date  01/09/19      PT LONG TERM GOAL #4   Title  Patient will demonstrate a 33% limitation on FOTO     Time  8    Period  Weeks    Status  New    Target Date  06/24/20             Plan - 04/02/19 0901    Clinical Impression Statement  Therapy added ultrasound to POC to reduce inflammation in the shoulder. He had less pain today and more passive ROM. Therapy also added shelf reach with 1 lb weight. He had no increase in pain. The intial plan was to add Ionto as well but he did well so it wasnt needed.    Comorbidities  low back pain. Right shoulder RTC repai 2019.     Examination-Activity Limitations  Caring for Others;Carry;Sleep;Hygiene/Grooming    Examination-Participation Restrictions  Cleaning;Community Activity;Yard Work    Rehab Potential  Good    PT Frequency  2x / week    PT Treatment/Interventions  ADLs/Self Care Home Management;Cryotherapy;Dentist;Therapeutic activities;Therapeutic exercise;Neuromuscular re-education;Patient/family education;Manual techniques;Passive range of motion;Dry needling;Taping    PT Next Visit Plan  continue with modalities if benenficial. Continue to perfrom HEP; continue with manual therapy    PT Home Exercise Plan  wand flexion; scap retraction; shoulder extension , wand ER    Consulted and Agree with Plan of Care  Patient       Patient will benefit from skilled therapeutic intervention in order to improve the following deficits and impairments:  Pain, Decreased activity tolerance, Decreased endurance, Decreased range of motion, Decreased strength, Impaired UE functional use, Increased muscle spasms  Visit Diagnosis: 1. Stiffness of left shoulder, not elsewhere classified   2. Muscle weakness (generalized)   3. Chronic left shoulder pain   4. Stiffness of right shoulder, not elsewhere classified        Problem List Patient Active Problem List   Diagnosis Date Noted  . Chronic left shoulder pain 09/05/2018  . Hyperlipidemia 08/15/2018  . Obesity (BMI 30-39.9) 08/15/2018  . Skin tag 08/15/2018  . Malignant neoplasm of prostate ([place]) 10/13/2017  .  Rotator cuff tear arthropathy of right shoulder 08/31/2017  . Personal history of gastric ulcer 02/09/2017    Carney Living PT DPT  04/02/2019, 1:06 PM  [facility] [address] [place], [place], [address] Phone: [phone]   Fax:  [phone]  Name: Roy Reed MRN: [id number] Date of Birth: 1956/06/29

## 2019-04-04 ENCOUNTER — Ambulatory Visit: Payer: Self-pay | Admitting: Physical Therapy

## 2019-04-04 ENCOUNTER — Encounter: Payer: Self-pay | Admitting: Physical Therapy

## 2019-04-04 ENCOUNTER — Other Ambulatory Visit: Payer: Self-pay

## 2019-04-04 DIAGNOSIS — G8929 Other chronic pain: Secondary | ICD-10-CM

## 2019-04-04 DIAGNOSIS — M25612 Stiffness of left shoulder, not elsewhere classified: Secondary | ICD-10-CM

## 2019-04-04 DIAGNOSIS — M25611 Stiffness of right shoulder, not elsewhere classified: Secondary | ICD-10-CM

## 2019-04-04 DIAGNOSIS — M6281 Muscle weakness (generalized): Secondary | ICD-10-CM

## 2019-04-04 NOTE — Therapy (Signed)
[place], [place], [address] Phone: [phone]   Fax:  [phone]  Physical Therapy Treatment/Progress Note   Patient Details  Name: Roy Reed MRN: [id number] Date of Birth: 01-Mar-1956 Referring Provider (PT): Dr Frankey Shown    Encounter Date: 04/04/2019  PT End of Session - 04/04/19 0856    Visit Number  8    Number of Visits  16    Date for PT Re-Evaluation  05/02/19    Authorization Type  CAFA     PT Start Time  0830    PT Stop Time  0915    PT Time Calculation (min)  45 min    Activity Tolerance  Patient tolerated treatment well    Behavior During Therapy  [facility] for tasks assessed/performed       Past Medical History:  Diagnosis Date  . Elevated PSA   . Left knee pain   . Low back pain   . Pre-diabetes   . Prostate cancer ([place])   . Rotator cuff disorder, right     Past Surgical History:  Procedure Laterality Date  . COLONOSCOPY    . PROSTATE BIOPSY    . PROSTATE BIOPSY    . RADIOACTIVE SEED IMPLANT N/A 01/02/2018   Procedure: RADIOACTIVE SEED IMPLANT/BRACHYTHERAPY IMPLANT;  Surgeon: Cleon Gustin, MD;  Location: [facility];  Service: Urology;  Laterality: N/A;  . SHOULDER ARTHROSCOPY WITH ROTATOR CUFF REPAIR AND SUBACROMIAL DECOMPRESSION Right 08/05/2017   Procedure: RIGHT SHOULDER ARTHROSCOPY WITH ROTATOR CUFF REPAIR, DISTAL CLAVICLE EXCISION, DEBRIDEMENT AND SUBACROMIAL DECOMPRESSION;  Surgeon: Leandrew Koyanagi, MD;  Location: [place];  Service: Orthopedics;  Laterality: Right;  . SPACE OAR INSTILLATION N/A 01/02/2018   Procedure: SPACE OAR INSTILLATION;  Surgeon: Cleon Gustin, MD;  Location: [facility];  Service: Urology;  Laterality: N/A;    There were no vitals filed for this visit.  Subjective Assessment - 04/04/19 0833    Subjective  Patient feels like the shoulder is improving. He is sleeping better. His chief  complaint is reaching with his left hand. He was able to play tennis this weak.    Limitations  Lifting;Other (comment)    Diagnostic tests  MRI: No tear but tendinitis of suprapsinatus and ifraspinatus; also inflammation of the bicpes          [facility] PT Assessment - 04/04/19 0001      AROM   Right Shoulder Flexion  115 Degrees    Right Shoulder Internal Rotation  --   can reach to L4    Right Shoulder External Rotation  --   can reach behind his head with much less pain      PROM   Left Shoulder Flexion  150 Degrees    Left Shoulder External Rotation  62 Degrees                   [facility] Adult PT Treatment/Exercise - 04/04/19 0001      Shoulder Exercises: Sidelying   Other Sidelying Exercises  sidelying ER 2x10       Shoulder Exercises: Standing   External Rotation  Theraband;20 reps   2x10   Theraband Level (Shoulder External Rotation)  Level 2 (Red)    Internal Rotation  20 reps    Theraband Level (Shoulder Internal Rotation)  Level 4 (Blue)    Extension  20 reps    Theraband Level (Shoulder Extension)  Level 4 (Blue)  Row  20 reps    Theraband Level (Shoulder Row)  Level 4 (Blue)    Other Standing Exercises  UE rangr into flexion x20     Other Standing Exercises  shelf reach 2nd shelf x6 2lb 2lb 1st shelf x10       Modalities   Modalities  Iontophoresis      Iontophoresis   Type of Iontophoresis  Dexamethasone    Location  left AC joint     Dose  1cc     Time  slow release patch      Manual Therapy   Manual Therapy  Passive ROM;Joint mobilization;Soft tissue mobilization    Manual therapy comments  soft tissue mobilization to upper traps     Joint Mobilization  PA glide and inferio grade III and IV, sidelying scapular mobilization with flexion x15 mod verbal cuing for straight flexion    Soft tissue mobilization  trigger point release to upper traps; superior glide of distal clavilce. and left pec     Passive ROM  into flexion, abduction, and ER                 PT Short Term Goals - 04/04/19 0900      PT SHORT TERM GOAL #1   Title  Patient will demosntrate full passive left shoulder flexion without pain     Baseline  iumproved flexion and ER but still not end range    Time  3    Period  Weeks    Status  On-going    Target Date  04/25/19      PT SHORT TERM GOAL #2   Title  Patient will demostrate 5/5 gross left shoulder strength     Baseline  improving but still limoted in flexion and ER    Time  3    Period  Weeks    Status  On-going    Target Date  04/25/19      PT SHORT TERM GOAL #3   Title  Patient will improve active left shoulder flexion and abduction     Baseline  improved significantly since eval but still limited    Time  4    Period  Weeks    Status  On-going    Target Date  04/25/19        PT Long Term Goals - 04/04/19 0905      PT LONG TERM GOAL #1   Title  Patient will sleep through the night without pain     Baseline  rechaning behind his head after stretching     Time  8    Period  Weeks    Status  On-going      PT LONG TERM GOAL #2   Title  Patient will return to tennis without left shoulder pain     Baseline  has returned to tennis    Time  8    Period  Weeks    Status  On-going            Plan - 04/04/19 0857    Clinical Impression Statement  Therapy perfromed a re-evaluation today. The patient has shown improvement in some areas such as funtional ER and functional IR. He still shows significant limitations in flexion and has not shown any improvement on his FOTO score. His strength has also improved. Therapy will trial Ionto to see if we can further reduce his inflammation. We will continue 2W3. If his flexion and fucntional score dosent improve  we will send him back tro his MD. He is back playing tennis and he is sleeping through the night.    Comorbidities  low back pain. Right shoulder RTC repai 2019.     Examination-Activity Limitations  Caring for  Others;Carry;Sleep;Hygiene/Grooming    Examination-Participation Restrictions  Cleaning;Community Activity;Yard Work    Stability/Clinical Decision Making  Stable/Uncomplicated    Clinical Decision Making  Low    Rehab Potential  Good    PT Frequency  2x / week    PT Duration  6 weeks    PT Treatment/Interventions  ADLs/Self Care Home Management;Cryotherapy;Dentist;Therapeutic activities;Therapeutic exercise;Neuromuscular re-education;Patient/family education;Manual techniques;Passive range of motion;Dry needling;Taping    PT Next Visit Plan  continue with modalities if benenficial. Continue to perfrom HEP; continue with manual therapy    PT Home Exercise Plan  wand flexion; scap retraction; shoulder extension , wand ER    Consulted and Agree with Plan of Care  Patient       Patient will benefit from skilled therapeutic intervention in order to improve the following deficits and impairments:  Pain, Decreased activity tolerance, Decreased endurance, Decreased range of motion, Decreased strength, Impaired UE functional use, Increased muscle spasms  Visit Diagnosis: 1. Stiffness of left shoulder, not elsewhere classified   2. Muscle weakness (generalized)   3. Chronic left shoulder pain   4. Stiffness of right shoulder, not elsewhere classified        Problem List Patient Active Problem List   Diagnosis Date Noted  . Chronic left shoulder pain 09/05/2018  . Hyperlipidemia 08/15/2018  . Obesity (BMI 30-39.9) 08/15/2018  . Skin tag 08/15/2018  . Malignant neoplasm of prostate ([place]) 10/13/2017  . Rotator cuff tear arthropathy of right shoulder 08/31/2017  . Personal history of gastric ulcer 02/09/2017    Carney Living PT DPT  04/04/2019, 11:17 AM  [facility] [address] [place], [place], [address] Phone: [phone]   Fax:  [phone]  Name: Zelig Gacek MRN:  [id number] Date of Birth: 07-Sep-1955

## 2019-04-11 MED FILL — TAMSULOSIN HCL 0.4 MG CAP: 0.4 | 30 days supply | Qty: 30 | Fill #6

## 2019-04-12 ENCOUNTER — Other Ambulatory Visit: Payer: Self-pay

## 2019-04-12 ENCOUNTER — Encounter: Payer: Self-pay | Admitting: Physical Therapy

## 2019-04-12 ENCOUNTER — Ambulatory Visit: Payer: Self-pay | Attending: Orthopaedic Surgery | Admitting: Physical Therapy

## 2019-04-12 DIAGNOSIS — M25611 Stiffness of right shoulder, not elsewhere classified: Secondary | ICD-10-CM | POA: Insufficient documentation

## 2019-04-12 DIAGNOSIS — M6281 Muscle weakness (generalized): Secondary | ICD-10-CM | POA: Insufficient documentation

## 2019-04-12 DIAGNOSIS — M25512 Pain in left shoulder: Secondary | ICD-10-CM | POA: Insufficient documentation

## 2019-04-12 DIAGNOSIS — M25612 Stiffness of left shoulder, not elsewhere classified: Secondary | ICD-10-CM | POA: Insufficient documentation

## 2019-04-12 DIAGNOSIS — G8929 Other chronic pain: Secondary | ICD-10-CM | POA: Insufficient documentation

## 2019-04-12 DIAGNOSIS — R600 Localized edema: Secondary | ICD-10-CM | POA: Insufficient documentation

## 2019-04-12 NOTE — Therapy (Signed)
[place], [place], [address] Phone: [phone]   Fax:  [phone]  Physical Therapy Treatment  Patient Details  Name: Roy Reed MRN: [id number] Date of Birth: 06-11-1956 Referring Provider (PT): Dr Frankey Shown    Encounter Date: 04/12/2019  PT End of Session - 04/12/19 0910    Visit Number  9    Number of Visits  16    Date for PT Re-Evaluation  05/02/19    Authorization Type  CAFA     PT Start Time  0845    PT Stop Time  0927    PT Time Calculation (min)  42 min    Activity Tolerance  Patient tolerated treatment well    Behavior During Therapy  [facility] for tasks assessed/performed       Past Medical History:  Diagnosis Date  . Elevated PSA   . Left knee pain   . Low back pain   . Pre-diabetes   . Prostate cancer ([place])   . Rotator cuff disorder, right     Past Surgical History:  Procedure Laterality Date  . COLONOSCOPY    . PROSTATE BIOPSY    . PROSTATE BIOPSY    . RADIOACTIVE SEED IMPLANT N/A 01/02/2018   Procedure: RADIOACTIVE SEED IMPLANT/BRACHYTHERAPY IMPLANT;  Surgeon: Cleon Gustin, MD;  Location: [facility];  Service: Urology;  Laterality: N/A;  . SHOULDER ARTHROSCOPY WITH ROTATOR CUFF REPAIR AND SUBACROMIAL DECOMPRESSION Right 08/05/2017   Procedure: RIGHT SHOULDER ARTHROSCOPY WITH ROTATOR CUFF REPAIR, DISTAL CLAVICLE EXCISION, DEBRIDEMENT AND SUBACROMIAL DECOMPRESSION;  Surgeon: Leandrew Koyanagi, MD;  Location: [place];  Service: Orthopedics;  Laterality: Right;  . SPACE OAR INSTILLATION N/A 01/02/2018   Procedure: SPACE OAR INSTILLATION;  Surgeon: Cleon Gustin, MD;  Location: [facility];  Service: Urology;  Laterality: N/A;    There were no vitals filed for this visit.  Subjective Assessment - 04/12/19 0908    Subjective  Patient reports normal soreness this morning. he was able to play tennis over the weekend. He felt  the patch helped    Limitations  Lifting;Other (comment)    Currently in Pain?  Yes    Pain Score  3     Pain Location  Shoulder    Pain Orientation  Left;Anterior    Pain Descriptors / Indicators  Aching    Pain Type  Chronic pain    Pain Onset  More than a month ago    Pain Frequency  Constant    Aggravating Factors   use of the shoulder    Pain Relieving Factors  rest    Effect of Pain on Daily Activities  difficulty using the left arm                       [facility] Adult PT Treatment/Exercise - 04/12/19 0001      Shoulder Exercises: Supine   Other Supine Exercises  flexion extension from 80-110 2lb 2x10; abd/add at 90 2x10       Shoulder Exercises: Sidelying   Other Sidelying Exercises  sidelying ER 2x101 lb       Shoulder Exercises: Standing   External Rotation  Theraband;20 reps   2x10   Theraband Level (Shoulder External Rotation)  Level 2 (Red)    Internal Rotation  20 reps    Theraband Level (Shoulder Internal Rotation)  Level 4 (Blue)    Extension  20 reps  Theraband Level (Shoulder Extension)  Level 4 (Blue)    Row  20 reps    Theraband Level (Shoulder Row)  Level 4 (Blue)      Shoulder Exercises: ROM/Strengthening   UBE (Upper Arm Bike)  2 min posterior       Shoulder Exercises: Stretch   Other Shoulder Stretches  upper trap in door.. Mod cuing for technique 3x15 seconds. Patient had some collar bone pain after stretch       Iontophoresis   Type of Iontophoresis  Dexamethasone    Location  left AC joint     Dose  1cc     Time  slow release patch      Manual Therapy   Manual Therapy  Passive ROM;Joint mobilization;Soft tissue mobilization    Manual therapy comments  soft tissue mobilization to upper traps     Joint Mobilization  PA glide and inferio grade III and IV, sidelying scapular mobilization with flexion x15 mod verbal cuing for straight flexion    Soft tissue mobilization  trigger point release to upper traps; superior glide of  distal clavilce. and left pec     Passive ROM  into flexion, abduction, and ER              PT Education - 04/12/19 0910    Education Details  benefits of continued stretching    Person(s) Educated  Patient    Methods  Explanation;Demonstration;Verbal cues;Tactile cues    Comprehension  Verbalized understanding;Returned demonstration;Verbal cues required;Tactile cues required;Need further instruction       PT Short Term Goals - 04/04/19 0900      PT SHORT TERM GOAL #1   Title  Patient will demosntrate full passive left shoulder flexion without pain     Baseline  iumproved flexion and ER but still not end range    Time  3    Period  Weeks    Status  On-going    Target Date  04/25/19      PT SHORT TERM GOAL #2   Title  Patient will demostrate 5/5 gross left shoulder strength     Baseline  improving but still limoted in flexion and ER    Time  3    Period  Weeks    Status  On-going    Target Date  04/25/19      PT SHORT TERM GOAL #3   Title  Patient will improve active left shoulder flexion and abduction     Baseline  improved significantly since eval but still limited    Time  4    Period  Weeks    Status  On-going    Target Date  04/25/19        PT Long Term Goals - 04/04/19 0905      PT LONG TERM GOAL #1   Title  Patient will sleep through the night without pain     Baseline  rechaning behind his head after stretching     Time  8    Period  Weeks    Status  On-going      PT LONG TERM GOAL #2   Title  Patient will return to tennis without left shoulder pain     Baseline  has returned to tennis    Time  8    Period  Weeks    Status  On-going            Plan - 04/12/19 0911    Clinical Impression  Statement  Patients passive external rotation is the best that it has been. He still becomes fatigued towards the end of his ther-ex. Therapy will continue to focus on anti-inflamatory modalities as well as continue to work Therapy will continue to work on  shoulder and scpaular endurance as well. He continues to have tightness in his pecs. Therapy reviewed doorway pec stretch but the patient required significant cuing.    Comorbidities  low back pain. Right shoulder RTC repai 2019.     Examination-Activity Limitations  Caring for Others;Carry;Sleep;Hygiene/Grooming    Examination-Participation Restrictions  Cleaning;Community Activity;Yard Work    Stability/Clinical Decision Making  Stable/Uncomplicated    Clinical Decision Making  Low    Rehab Potential  Good    PT Frequency  2x / week    PT Duration  6 weeks    PT Treatment/Interventions  ADLs/Self Care Home Management;Cryotherapy;Dentist;Therapeutic activities;Therapeutic exercise;Neuromuscular re-education;Patient/family education;Manual techniques;Passive range of motion;Dry needling;Taping    PT Next Visit Plan  continue with modalities if benenficial. Continue to perfrom HEP; continue with manual therapy    PT Home Exercise Plan  wand flexion; scap retraction; shoulder extension , wand ER    Consulted and Agree with Plan of Care  Patient       Patient will benefit from skilled therapeutic intervention in order to improve the following deficits and impairments:  Pain, Decreased activity tolerance, Decreased endurance, Decreased range of motion, Decreased strength, Impaired UE functional use, Increased muscle spasms  Visit Diagnosis: 1. Stiffness of left shoulder, not elsewhere classified   2. Muscle weakness (generalized)   3. Chronic left shoulder pain        Problem List Patient Active Problem List   Diagnosis Date Noted  . Chronic left shoulder pain 09/05/2018  . Hyperlipidemia 08/15/2018  . Obesity (BMI 30-39.9) 08/15/2018  . Skin tag 08/15/2018  . Malignant neoplasm of prostate ([place]) 10/13/2017  . Rotator cuff tear arthropathy of right shoulder 08/31/2017  . Personal history of gastric ulcer 02/09/2017    Carney Living PT DPT  04/12/2019, 3:36 PM  [facility] [address] [place], [place], [address] Phone: [phone]   Fax:  [phone]  Name: Demarius Archila MRN: [id number] Date of Birth: [birth year]

## 2019-04-20 ENCOUNTER — Encounter

## 2019-04-23 ENCOUNTER — Encounter: Payer: Self-pay | Admitting: Physical Therapy

## 2019-04-23 ENCOUNTER — Ambulatory Visit: Payer: Self-pay | Admitting: Physical Therapy

## 2019-04-23 ENCOUNTER — Other Ambulatory Visit: Payer: Self-pay

## 2019-04-23 DIAGNOSIS — G8929 Other chronic pain: Secondary | ICD-10-CM

## 2019-04-23 DIAGNOSIS — M25612 Stiffness of left shoulder, not elsewhere classified: Secondary | ICD-10-CM

## 2019-04-23 DIAGNOSIS — M6281 Muscle weakness (generalized): Secondary | ICD-10-CM

## 2019-04-23 NOTE — Therapy (Signed)
[place], [place], [address] Phone: [phone]   Fax:  [phone]  Physical Therapy Treatment  Patient Details  Name: Roy Reed MRN: [id number] Date of Birth: June 23, 1956 Referring Provider (PT): Dr Frankey Shown    Encounter Date: 04/23/2019  PT End of Session - 04/23/19 0907    Visit Number  10    Number of Visits  16    Date for PT Re-Evaluation  05/02/19    Authorization Type  CAFA     PT Start Time  0844    PT Stop Time  0927    PT Time Calculation (min)  43 min    Activity Tolerance  Patient tolerated treatment well    Behavior During Therapy  [facility] for tasks assessed/performed       Past Medical History:  Diagnosis Date  . Elevated PSA   . Left knee pain   . Low back pain   . Pre-diabetes   . Prostate cancer ([place])   . Rotator cuff disorder, right     Past Surgical History:  Procedure Laterality Date  . COLONOSCOPY    . PROSTATE BIOPSY    . PROSTATE BIOPSY    . RADIOACTIVE SEED IMPLANT N/A 01/02/2018   Procedure: RADIOACTIVE SEED IMPLANT/BRACHYTHERAPY IMPLANT;  Surgeon: Cleon Gustin, MD;  Location: [facility];  Service: Urology;  Laterality: N/A;  . SHOULDER ARTHROSCOPY WITH ROTATOR CUFF REPAIR AND SUBACROMIAL DECOMPRESSION Right 08/05/2017   Procedure: RIGHT SHOULDER ARTHROSCOPY WITH ROTATOR CUFF REPAIR, DISTAL CLAVICLE EXCISION, DEBRIDEMENT AND SUBACROMIAL DECOMPRESSION;  Surgeon: Leandrew Koyanagi, MD;  Location: [place];  Service: Orthopedics;  Laterality: Right;  . SPACE OAR INSTILLATION N/A 01/02/2018   Procedure: SPACE OAR INSTILLATION;  Surgeon: Cleon Gustin, MD;  Location: [facility];  Service: Urology;  Laterality: N/A;    There were no vitals filed for this visit.  Subjective Assessment - 04/23/19 0848    Subjective  Patient treports it is getting better. He had no pain over the weekend. He has no pain this  morning.    Limitations  Lifting;Other (comment)    Diagnostic tests  MRI: No tear but tendinitis of suprapsinatus and ifraspinatus; also inflammation of the bicpes     Currently in Pain?  No/denies    Multiple Pain Sites  No                       [facility] Adult PT Treatment/Exercise - 04/23/19 0001      Shoulder Exercises: Supine   Protraction  20 reps;Strengthening    Protraction Weight (lbs)  3    Protraction Limitations  2ith dowel    Flexion  Both;Strengthening;20 reps    Shoulder Flexion Weight (lbs)  3    Flexion Limitations  with dowel      Shoulder Exercises: Prone   Retraction  20 reps    Retraction Weight (lbs)  3    Extension  20 reps    Extension Weight (lbs)  3      Shoulder Exercises: Standing   External Rotation  Theraband;20 reps   2x10   Theraband Level (Shoulder External Rotation)  Level 2 (Red)    Internal Rotation  20 reps    Theraband Level (Shoulder Internal Rotation)  Level 4 (Blue)    Extension  20 reps    Theraband Level (Shoulder Extension)  Level 4 (Blue)    Row  20 reps  Theraband Level (Shoulder Row)  Level 4 (Blue)    Other Standing Exercises  UE rangr into flexion x20       Shoulder Exercises: ROM/Strengthening   UBE (Upper Arm Bike)  3 min posterior       Shoulder Exercises: Stretch   Other Shoulder Stretches  pec stretch in door       Manual Therapy   Manual Therapy  Passive ROM;Joint mobilization;Soft tissue mobilization    Manual therapy comments  soft tissue mobilization to upper traps     Joint Mobilization  PA glide and inferio grade III and IV, sidelying scapular mobilization with flexion x15 mod verbal cuing for straight flexion    Soft tissue mobilization  trigger point release to upper traps; superior glide of distal clavilce. and left pec     Passive ROM  into flexion, abduction, and ER              PT Education - 04/23/19 0907    Education Details  reviewed HEP and technique with exercises     Person(s) Educated  Patient    Methods  Explanation;Demonstration;Tactile cues;Verbal cues    Comprehension  Verbalized understanding;Verbal cues required;Tactile cues required       PT Short Term Goals - 04/23/19 0930      PT SHORT TERM GOAL #1   Title  Patient will demosntrate full passive left shoulder flexion without pain     Baseline  improved ER    Time  3    Period  Weeks    Status  On-going      PT SHORT TERM GOAL #2   Title  Patient will demostrate 5/5 gross left shoulder strength     Baseline  improving but still limoted in flexion and ER    Time  3    Period  Weeks    Status  On-going      PT SHORT TERM GOAL #3   Title  Patient will improve active left shoulder flexion and abduction     Baseline  improved significantly since eval but still limited    Time  4    Period  Weeks    Status  On-going    Target Date  04/25/19      PT SHORT TERM GOAL #4   Title  Patient will demostrate 3/5 right shoulder flexion strength below 90 degrees     Baseline  4/5 to 120 dgrees     Time  4    Period  Weeks    Status  On-going        PT Long Term Goals - 04/04/19 0905      PT LONG TERM GOAL #1   Title  Patient will sleep through the night without pain     Baseline  rechaning behind his head after stretching     Time  8    Period  Weeks    Status  On-going      PT LONG TERM GOAL #2   Title  Patient will return to tennis without left shoulder pain     Baseline  has returned to tennis    Time  8    Period  Weeks    Status  On-going            Plan - 04/23/19 0908    Clinical Impression Statement  Per visual inspection the patients range is the bes thtat t has been. He is still limited in ER but it is  improving. He tolerated exercises well. He was encouraged to continue being consistent with his exercises at home.    Comorbidities  low back pain. Right shoulder RTC repai 2019.     Examination-Activity Limitations  Caring for Others;Carry;Sleep;Hygiene/Grooming     Examination-Participation Restrictions  Cleaning;Community Activity;Yard Work    Stability/Clinical Decision Making  Stable/Uncomplicated    Clinical Decision Making  Low    Rehab Potential  Good    PT Frequency  2x / week    PT Treatment/Interventions  ADLs/Self Care Home Management;Cryotherapy;Dentist;Therapeutic activities;Therapeutic exercise;Neuromuscular re-education;Patient/family education;Manual techniques;Passive range of motion;Dry needling;Taping    PT Next Visit Plan  continue with modalities if benenficial. Continue to perfrom HEP; continue with manual therapy    PT Home Exercise Plan  wand flexion; scap retraction; shoulder extension , wand ER    Consulted and Agree with Plan of Care  Patient       Patient will benefit from skilled therapeutic intervention in order to improve the following deficits and impairments:  Pain, Decreased activity tolerance, Decreased endurance, Decreased range of motion, Decreased strength, Impaired UE functional use, Increased muscle spasms  Visit Diagnosis: 1. Stiffness of left shoulder, not elsewhere classified   2. Muscle weakness (generalized)   3. Chronic left shoulder pain        Problem List Patient Active Problem List   Diagnosis Date Noted  . Chronic left shoulder pain 09/05/2018  . Hyperlipidemia 08/15/2018  . Obesity (BMI 30-39.9) 08/15/2018  . Skin tag 08/15/2018  . Malignant neoplasm of prostate ([place]) 10/13/2017  . Rotator cuff tear arthropathy of right shoulder 08/31/2017  . Personal history of gastric ulcer 02/09/2017    Carney Living  PT DPT  04/23/2019, 9:37 AM  [facility] [address] [place], [place], [address] Phone: [phone]   Fax:  [phone]  Name: Roy Reed MRN: [id number] Date of Birth: [birth year]

## 2019-04-25 ENCOUNTER — Ambulatory Visit: Payer: Self-pay | Admitting: Physical Therapy

## 2019-04-25 ENCOUNTER — Encounter: Payer: Self-pay | Admitting: Physical Therapy

## 2019-04-25 ENCOUNTER — Other Ambulatory Visit: Payer: Self-pay

## 2019-04-25 DIAGNOSIS — M25612 Stiffness of left shoulder, not elsewhere classified: Secondary | ICD-10-CM

## 2019-04-25 DIAGNOSIS — M25611 Stiffness of right shoulder, not elsewhere classified: Secondary | ICD-10-CM

## 2019-04-25 DIAGNOSIS — G8929 Other chronic pain: Secondary | ICD-10-CM

## 2019-04-25 DIAGNOSIS — R600 Localized edema: Secondary | ICD-10-CM

## 2019-04-25 DIAGNOSIS — M6281 Muscle weakness (generalized): Secondary | ICD-10-CM

## 2019-04-25 NOTE — Therapy (Signed)
[place], [place], [address] Phone: [phone]   Fax:  [phone]  Physical Therapy Treatment  Patient Details  Name: Roy Reed MRN: [id number] Date of Birth: 1956/07/04 Referring Provider (PT): Dr Frankey Shown    Encounter Date: 04/25/2019  PT End of Session - 04/25/19 0907    Visit Number  11    Number of Visits  16    Date for PT Re-Evaluation  05/02/19    Authorization Type  CAFA     PT Start Time  0845    PT Stop Time  0925    PT Time Calculation (min)  40 min    Activity Tolerance  Patient tolerated treatment well    Behavior During Therapy  [facility] for tasks assessed/performed       Past Medical History:  Diagnosis Date  . Elevated PSA   . Left knee pain   . Low back pain   . Pre-diabetes   . Prostate cancer ([place])   . Rotator cuff disorder, right     Past Surgical History:  Procedure Laterality Date  . COLONOSCOPY    . PROSTATE BIOPSY    . PROSTATE BIOPSY    . RADIOACTIVE SEED IMPLANT N/A 01/02/2018   Procedure: RADIOACTIVE SEED IMPLANT/BRACHYTHERAPY IMPLANT;  Surgeon: Cleon Gustin, MD;  Location: [facility];  Service: Urology;  Laterality: N/A;  . SHOULDER ARTHROSCOPY WITH ROTATOR CUFF REPAIR AND SUBACROMIAL DECOMPRESSION Right 08/05/2017   Procedure: RIGHT SHOULDER ARTHROSCOPY WITH ROTATOR CUFF REPAIR, DISTAL CLAVICLE EXCISION, DEBRIDEMENT AND SUBACROMIAL DECOMPRESSION;  Surgeon: Leandrew Koyanagi, MD;  Location: [place];  Service: Orthopedics;  Laterality: Right;  . SPACE OAR INSTILLATION N/A 01/02/2018   Procedure: SPACE OAR INSTILLATION;  Surgeon: Cleon Gustin, MD;  Location: [facility];  Service: Urology;  Laterality: N/A;    There were no vitals filed for this visit.  Subjective Assessment - 04/25/19 0905    Subjective  Patient continues to report improvement. he was able to play tennis yesterday without increased  pain. He is having no pain this morning.,    Patient is accompained by:  Interpreter   Number [phone]   Limitations  Lifting;Other (comment)    Diagnostic tests  MRI: No tear but tendinitis of suprapsinatus and ifraspinatus; also inflammation of the bicpes     Currently in Pain?  No/denies                               PT Education - 04/25/19 0907    Education Details  reviewed HEP and technique with exercises    Person(s) Educated  Patient    Methods  Explanation;Demonstration;Tactile cues;Verbal cues    Comprehension  Verbalized understanding;Returned demonstration;Verbal cues required;Tactile cues required       PT Short Term Goals - 04/23/19 0930      PT SHORT TERM GOAL #1   Title  Patient will demosntrate full passive left shoulder flexion without pain     Baseline  improved ER    Time  3    Period  Weeks    Status  On-going      PT SHORT TERM GOAL #2   Title  Patient will demostrate 5/5 gross left shoulder strength     Baseline  improving but still limoted in flexion and ER    Time  3    Period  Weeks  Status  On-going      PT SHORT TERM GOAL #3   Title  Patient will improve active left shoulder flexion and abduction     Baseline  improved significantly since eval but still limited    Time  4    Period  Weeks    Status  On-going    Target Date  04/25/19      PT SHORT TERM GOAL #4   Title  Patient will demostrate 3/5 right shoulder flexion strength below 90 degrees     Baseline  4/5 to 120 dgrees     Time  4    Period  Weeks    Status  On-going        PT Long Term Goals - 04/04/19 0905      PT LONG TERM GOAL #1   Title  Patient will sleep through the night without pain     Baseline  rechaning behind his head after stretching     Time  8    Period  Weeks    Status  On-going      PT LONG TERM GOAL #2   Title  Patient will return to tennis without left shoulder pain     Baseline  has returned to tennis    Time  8    Period   Weeks    Status  On-going            Plan - 04/25/19 0908    Clinical Impression Statement  Patient is having much less pain with end range ER> Therapy continues to focus on decreasing pec tightness. He also still has limited shoulder IR but it is improving as well. Patient had improved active shoulder flexion today.    Personal Factors and Comorbidities  Comorbidity 2    Comorbidities  low back pain. Right shoulder RTC repai 2019.     Examination-Activity Limitations  Caring for Others;Carry;Sleep;Hygiene/Grooming    Examination-Participation Restrictions  Cleaning;Community Activity;Yard Work    Stability/Clinical Decision Making  Stable/Uncomplicated    Clinical Decision Making  Low    Rehab Potential  Good    PT Frequency  2x / week    PT Duration  6 weeks    PT Treatment/Interventions  ADLs/Self Care Home Management;Cryotherapy;Dentist;Therapeutic activities;Therapeutic exercise;Neuromuscular re-education;Patient/family education;Manual techniques;Passive range of motion;Dry needling;Taping    PT Next Visit Plan  continue with modalities if benenficial. Continue to perfrom HEP; continue with manual therapy    PT Home Exercise Plan  wand flexion; scap retraction; shoulder extension , wand ER    Consulted and Agree with Plan of Care  Patient       Patient will benefit from skilled therapeutic intervention in order to improve the following deficits and impairments:  Pain, Decreased activity tolerance, Decreased endurance, Decreased range of motion, Decreased strength, Impaired UE functional use, Increased muscle spasms  Visit Diagnosis: 1. Stiffness of left shoulder, not elsewhere classified   2. Muscle weakness (generalized)   3. Chronic left shoulder pain   4. Stiffness of right shoulder, not elsewhere classified   5. Localized edema        Problem List Patient Active Problem List   Diagnosis Date Noted  . Chronic  left shoulder pain 09/05/2018  . Hyperlipidemia 08/15/2018  . Obesity (BMI 30-39.9) 08/15/2018  . Skin tag 08/15/2018  . Malignant neoplasm of prostate ([place]) 10/13/2017  . Rotator cuff tear arthropathy of right shoulder 08/31/2017  . Personal history of gastric ulcer 02/09/2017  Carney Living  PT DPT  04/25/2019, 8:35 PM  [facility] [address] [place], [place], [address] Phone: [phone]   Fax:  [phone]  Name: Roy Reed MRN: [id number] Date of Birth: 04/11/1956

## 2019-04-26 MED FILL — ?ATORVASTATIN 10 MG TABLET: 10 | 30 days supply | Qty: 30 | Fill #1

## 2019-05-04 ENCOUNTER — Encounter

## 2019-05-08 ENCOUNTER — Encounter: Payer: Self-pay | Admitting: Physical Therapy

## 2019-05-08 ENCOUNTER — Other Ambulatory Visit: Payer: Self-pay

## 2019-05-08 ENCOUNTER — Ambulatory Visit: Payer: Self-pay | Attending: Orthopaedic Surgery | Admitting: Physical Therapy

## 2019-05-08 DIAGNOSIS — M25512 Pain in left shoulder: Secondary | ICD-10-CM | POA: Insufficient documentation

## 2019-05-08 DIAGNOSIS — M25611 Stiffness of right shoulder, not elsewhere classified: Secondary | ICD-10-CM | POA: Insufficient documentation

## 2019-05-08 DIAGNOSIS — M25612 Stiffness of left shoulder, not elsewhere classified: Secondary | ICD-10-CM | POA: Insufficient documentation

## 2019-05-08 DIAGNOSIS — G8929 Other chronic pain: Secondary | ICD-10-CM | POA: Insufficient documentation

## 2019-05-08 DIAGNOSIS — M6281 Muscle weakness (generalized): Secondary | ICD-10-CM | POA: Insufficient documentation

## 2019-05-08 NOTE — Therapy (Signed)
[place], [place], [address] Phone: [phone]   Fax:  [phone]  Physical Therapy Treatment  Patient Details  Name: Roy Reed MRN: [id number] Date of Birth: [birth year] Referring Provider (PT): Dr Frankey Shown    Encounter Date: 05/08/2019  PT End of Session - 05/08/19 0948    Visit Number  12    Number of Visits  16    Date for PT Re-Evaluation  05/02/19    Authorization Type  CAFA     PT Start Time  0845    PT Stop Time  0924    PT Time Calculation (min)  39 min    Activity Tolerance  Patient tolerated treatment well    Behavior During Therapy  [facility] for tasks assessed/performed       Past Medical History:  Diagnosis Date  . Elevated PSA   . Left knee pain   . Low back pain   . Pre-diabetes   . Prostate cancer ([place])   . Rotator cuff disorder, right     Past Surgical History:  Procedure Laterality Date  . COLONOSCOPY    . PROSTATE BIOPSY    . PROSTATE BIOPSY    . RADIOACTIVE SEED IMPLANT N/A 01/02/2018   Procedure: RADIOACTIVE SEED IMPLANT/BRACHYTHERAPY IMPLANT;  Surgeon: Cleon Gustin, MD;  Location: [facility];  Service: Urology;  Laterality: N/A;  . SHOULDER ARTHROSCOPY WITH ROTATOR CUFF REPAIR AND SUBACROMIAL DECOMPRESSION Right 08/05/2017   Procedure: RIGHT SHOULDER ARTHROSCOPY WITH ROTATOR CUFF REPAIR, DISTAL CLAVICLE EXCISION, DEBRIDEMENT AND SUBACROMIAL DECOMPRESSION;  Surgeon: Leandrew Koyanagi, MD;  Location: [place];  Service: Orthopedics;  Laterality: Right;  . SPACE OAR INSTILLATION N/A 01/02/2018   Procedure: SPACE OAR INSTILLATION;  Surgeon: Cleon Gustin, MD;  Location: [facility];  Service: Urology;  Laterality: N/A;    There were no vitals filed for this visit.  Subjective Assessment - 05/08/19 0849    Subjective  Patient reports he is only having pain when he lies on his shoulder. He isotherwise feeling better.  He feels better then he did on his last visit 2 weeks ago. He has not had a chance to play tennis/.    Patient is accompained by:  Interpreter   Michelle Stratus [phone]   Limitations  Lifting;Other (comment)    Diagnostic tests  MRI: No tear but tendinitis of suprapsinatus and ifraspinatus; also inflammation of the bicpes     Currently in Pain?  No/denies                       [facility] Adult PT Treatment/Exercise - 05/08/19 0001      Shoulder Exercises: Supine   Protraction  20 reps;Strengthening    Protraction Weight (lbs)  3    Protraction Limitations  2ith dowel    Flexion  Both;Strengthening;20 reps    Shoulder Flexion Weight (lbs)  3    Flexion Limitations  with dowel      Shoulder Exercises: Prone   Retraction  20 reps    Retraction Weight (lbs)  3    Extension  20 reps    Extension Weight (lbs)  3      Shoulder Exercises: Standing   External Rotation  Theraband;20 reps   2x10   Theraband Level (Shoulder External Rotation)  Level 2 (Red)    Internal Rotation  20 reps    Theraband Level (Shoulder Internal Rotation)  Level 4 (Blue)  Extension  20 reps    Theraband Level (Shoulder Extension)  Level 4 (Blue)    Row  20 reps    Theraband Level (Shoulder Row)  Level 4 (Blue)    Other Standing Exercises  UE rangr into flexion x20       Shoulder Exercises: ROM/Strengthening   UBE (Upper Arm Bike)  3 min posterior       Shoulder Exercises: Stretch   Other Shoulder Stretches  pec stretch in door       Manual Therapy   Manual Therapy  Passive ROM;Joint mobilization;Soft tissue mobilization    Manual therapy comments  soft tissue mobilization to upper traps     Joint Mobilization  PA glide and inferio grade III and IV, sidelying scapular mobilization with flexion x15 mod verbal cuing for straight flexion    Soft tissue mobilization  trigger point release to upper traps; superior glide of distal clavilce. and left pec     Passive ROM  into flexion, abduction,  and ER              PT Education - 05/08/19 0917    Education Details  improtance of ER stretching and strengthening.    Person(s) Educated  Patient    Methods  Explanation;Demonstration;Verbal cues    Comprehension  Verbalized understanding;Returned demonstration;Verbal cues required;Tactile cues required       PT Short Term Goals - 05/08/19 1028      PT SHORT TERM GOAL #1   Title  Patient will demosntrate full passive left shoulder flexion without pain     Baseline  improved ER    Time  3    Period  Weeks    Status  On-going    Target Date  04/25/19      PT SHORT TERM GOAL #2   Title  Patient will demostrate 5/5 gross left shoulder strength     Baseline  improving but still limoted in flexion and ER    Time  3    Period  Weeks    Status  On-going    Target Date  04/25/19      PT SHORT TERM GOAL #3   Title  Patient will improve active left shoulder flexion and abduction     Baseline  improved significantly since eval but still limited    Time  4    Period  Weeks    Status  On-going    Target Date  04/25/19      PT SHORT TERM GOAL #4   Title  Patient will demostrate 3/5 right shoulder flexion strength below 90 degrees     Baseline  4/5 to 120 dgrees     Time  4    Period  Weeks    Status  Achieved    Target Date  09/19/17        PT Long Term Goals - 04/04/19 0905      PT LONG TERM GOAL #1   Title  Patient will sleep through the night without pain     Baseline  rechaning behind his head after stretching     Time  8    Period  Weeks    Status  On-going      PT LONG TERM GOAL #2   Title  Patient will return to tennis without left shoulder pain     Baseline  has returned to tennis    Time  8    Period  Weeks    Status  On-going            Plan - 05/08/19 0958    Clinical Impression Statement  Patient ER was tight in the beggining but loosened up quickly. The patient hasnt been in ia few weeks.    Comorbidities  low back pain. Right shoulder  RTC repai 2019.     Examination-Activity Limitations  Caring for Others;Carry;Sleep;Hygiene/Grooming    Examination-Participation Restrictions  Cleaning;Community Activity;Yard Work    Stability/Clinical Decision Making  Stable/Uncomplicated    Clinical Decision Making  Low    Rehab Potential  Good    PT Frequency  2x / week    PT Duration  6 weeks    PT Treatment/Interventions  ADLs/Self Care Home Management;Cryotherapy;Dentist;Therapeutic activities;Therapeutic exercise;Neuromuscular re-education;Patient/family education;Manual techniques;Passive range of motion;Dry needling;Taping    PT Next Visit Plan  continue with modalities if benenficial. Continue to perfrom HEP; continue with manual therapy    PT Home Exercise Plan  wand flexion; scap retraction; shoulder extension , wand ER       Patient will benefit from skilled therapeutic intervention in order to improve the following deficits and impairments:  Pain, Decreased activity tolerance, Decreased endurance, Decreased range of motion, Decreased strength, Impaired UE functional use, Increased muscle spasms  Visit Diagnosis: Stiffness of left shoulder, not elsewhere classified  Muscle weakness (generalized)  Chronic left shoulder pain     Problem List Patient Active Problem List   Diagnosis Date Noted  . Chronic left shoulder pain 09/05/2018  . Hyperlipidemia 08/15/2018  . Obesity (BMI 30-39.9) 08/15/2018  . Skin tag 08/15/2018  . Malignant neoplasm of prostate ([place]) 10/13/2017  . Rotator cuff tear arthropathy of right shoulder 08/31/2017  . Personal history of gastric ulcer 02/09/2017    Carney Living PT DPT  05/08/2019, 10:30 AM  [facility] [address] [place], [place], [address] Phone: [phone]   Fax:  [phone]  Name: Roy Reed MRN: [id number] Date of Birth: Dec 24, 1955

## 2019-05-10 ENCOUNTER — Ambulatory Visit: Payer: Self-pay | Admitting: Physical Therapy

## 2019-05-10 ENCOUNTER — Other Ambulatory Visit: Payer: Self-pay

## 2019-05-10 ENCOUNTER — Encounter: Payer: Self-pay | Admitting: Physical Therapy

## 2019-05-10 DIAGNOSIS — M25611 Stiffness of right shoulder, not elsewhere classified: Secondary | ICD-10-CM

## 2019-05-10 DIAGNOSIS — M25612 Stiffness of left shoulder, not elsewhere classified: Secondary | ICD-10-CM

## 2019-05-10 DIAGNOSIS — G8929 Other chronic pain: Secondary | ICD-10-CM

## 2019-05-10 DIAGNOSIS — M6281 Muscle weakness (generalized): Secondary | ICD-10-CM

## 2019-05-10 MED FILL — TAMSULOSIN HCL 0.4 MG CAP: 0.4 | 30 days supply | Qty: 30 | Fill #7

## 2019-05-10 NOTE — Therapy (Signed)
[place], [place], [address] Phone: [phone]   Fax:  [phone]  Physical Therapy Treatment  Patient Details  Name: Roy Reed MRN: [id number] Date of Birth: 05/24/1956 Referring Provider (PT): Dr Frankey Shown    Encounter Date: 05/10/2019  PT End of Session - 05/10/19 0905    Visit Number  13    Number of Visits  22    Date for PT Re-Evaluation  05/31/19    Authorization Type  CAFA     PT Start Time  0845    PT Stop Time  0924    PT Time Calculation (min)  39 min    Activity Tolerance  Patient tolerated treatment well    Behavior During Therapy  [facility] for tasks assessed/performed       Past Medical History:  Diagnosis Date  . Elevated PSA   . Left knee pain   . Low back pain   . Pre-diabetes   . Prostate cancer ([place])   . Rotator cuff disorder, right     Past Surgical History:  Procedure Laterality Date  . COLONOSCOPY    . PROSTATE BIOPSY    . PROSTATE BIOPSY    . RADIOACTIVE SEED IMPLANT N/A 01/02/2018   Procedure: RADIOACTIVE SEED IMPLANT/BRACHYTHERAPY IMPLANT;  Surgeon: Cleon Gustin, MD;  Location: [facility];  Service: Urology;  Laterality: N/A;  . SHOULDER ARTHROSCOPY WITH ROTATOR CUFF REPAIR AND SUBACROMIAL DECOMPRESSION Right 08/05/2017   Procedure: RIGHT SHOULDER ARTHROSCOPY WITH ROTATOR CUFF REPAIR, DISTAL CLAVICLE EXCISION, DEBRIDEMENT AND SUBACROMIAL DECOMPRESSION;  Surgeon: Leandrew Koyanagi, MD;  Location: [place];  Service: Orthopedics;  Laterality: Right;  . SPACE OAR INSTILLATION N/A 01/02/2018   Procedure: SPACE OAR INSTILLATION;  Surgeon: Cleon Gustin, MD;  Location: [facility];  Service: Urology;  Laterality: N/A;    There were no vitals filed for this visit.  Subjective Assessment - 05/10/19 1440    Subjective  Patient reports pulling and pain with exercises. He also reports pain while sleeping on his  shoulder. he has no pain this mornming.    Patient is accompained by:  Interpreter    Limitations  Lifting;Other (comment)    Diagnostic tests  MRI: No tear but tendinitis of suprapsinatus and ifraspinatus; also inflammation of the bicpes     Currently in Pain?  No/denies                       [facility] Adult PT Treatment/Exercise - 05/10/19 0001      Shoulder Exercises: Supine   Protraction  20 reps;Strengthening    Protraction Weight (lbs)  3    Protraction Limitations  2ith dowel    Flexion  Both;Strengthening;20 reps    Shoulder Flexion Weight (lbs)  3    Flexion Limitations  with dowel      Shoulder Exercises: Prone   Retraction  20 reps    Retraction Weight (lbs)  3    Extension  20 reps    Extension Weight (lbs)  3      Shoulder Exercises: Standing   External Rotation  Theraband;20 reps   2x10   Theraband Level (Shoulder External Rotation)  Level 2 (Red)    Internal Rotation  20 reps    Theraband Level (Shoulder Internal Rotation)  Level 4 (Blue)    Extension  20 reps    Theraband Level (Shoulder Extension)  Level 4 (Blue)  Row  20 reps    Theraband Level (Shoulder Row)  Level 4 (Blue)    Other Standing Exercises  door way stretch 5x10 sec hold       Shoulder Exercises: ROM/Strengthening   UBE (Upper Arm Bike)  3 min posterior       Shoulder Exercises: Stretch   Other Shoulder Stretches  pec stretch in door       Manual Therapy   Manual Therapy  Passive ROM;Joint mobilization;Soft tissue mobilization    Manual therapy comments  soft tissue mobilization to upper traps     Joint Mobilization  PA glide and inferio grade III and IV, sidelying scapular mobilization with flexion x15 mod verbal cuing for straight flexion    Soft tissue mobilization  trigger point release to upper traps; superior glide of distal clavilce. and left pec     Passive ROM  into flexion, abduction, and ER              PT Education - 05/10/19 1441    Education Details   reviewed expected progression of exercises    Person(s) Educated  Patient    Methods  Explanation;Demonstration;Tactile cues;Verbal cues    Comprehension  Returned demonstration;Verbal cues required;Tactile cues required;Verbalized understanding       PT Short Term Goals - 05/10/19 0914      PT SHORT TERM GOAL #1   Title  Patient will demosntrate full passive left shoulder flexion without pain     Baseline  improved but still limited    Time  3    Period  Weeks    Status  On-going      PT SHORT TERM GOAL #2   Title  Patient will demostrate 5/5 gross left shoulder strength     Baseline  improving but still limoted in flexion and ER    Time  3    Period  Weeks    Status  On-going      PT SHORT TERM GOAL #3   Title  Patient will improve active left shoulder flexion and abduction     Baseline  improved significantly since eval but still limited    Time  4    Period  Weeks    Status  On-going      PT SHORT TERM GOAL #4   Title  Patient will demostrate 3/5 right shoulder flexion strength below 90 degrees     Baseline  4/5 to 120 dgrees     Time  4    Period  Weeks    Status  On-going        PT Long Term Goals - 04/04/19 0905      PT LONG TERM GOAL #1   Title  Patient will sleep through the night without pain     Baseline  rechaning behind his head after stretching     Time  8    Period  Weeks    Status  On-going      PT LONG TERM GOAL #2   Title  Patient will return to tennis without left shoulder pain     Baseline  has returned to tennis    Time  8    Period  Weeks    Status  On-going            Plan - 05/10/19 0906    Clinical Impression Statement  Patient has 2 more visits scheduled. He will likely be d/C'd back to MD at that point. His range  has improved and his pain is better but it is still there and it is still waking him up at night. e reports some of his exercises casue him pain including his doorway stretch. Since adding the doorway stretch he has  had improved ER. He was encouraged to cotninue it at home. He was not able to schedule for 2 weeks during this current certification period so this is only his 3rd visit in that period. We will extend him for another 2 weeks. He was encoruaged to call Dr Erlinda Hong and make another apointment.    Personal Factors and Comorbidities  Comorbidity 2    Comorbidities  low back pain. Right shoulder RTC repai 2019.     Examination-Participation Restrictions  Cleaning;Community Activity;Yard Work    Stability/Clinical Decision Making  Stable/Uncomplicated    Clinical Decision Making  Low    Rehab Potential  Good    PT Frequency  2x / week    PT Treatment/Interventions  ADLs/Self Care Home Management;Cryotherapy;Dentist;Therapeutic activities;Therapeutic exercise;Neuromuscular re-education;Patient/family education;Manual techniques;Passive range of motion;Dry needling;Taping    PT Next Visit Plan  continue with modalities if benenficial. Continue to perfrom HEP; continue with manual therapy    PT Home Exercise Plan  wand flexion; scap retraction; shoulder extension , wand ER    Consulted and Agree with Plan of Care  Patient       Patient will benefit from skilled therapeutic intervention in order to improve the following deficits and impairments:  Pain, Decreased activity tolerance, Decreased endurance, Decreased range of motion, Decreased strength, Impaired UE functional use, Increased muscle spasms  Visit Diagnosis: Stiffness of left shoulder, not elsewhere classified  Muscle weakness (generalized)  Chronic left shoulder pain  Stiffness of right shoulder, not elsewhere classified     Problem List Patient Active Problem List   Diagnosis Date Noted  . Chronic left shoulder pain 09/05/2018  . Hyperlipidemia 08/15/2018  . Obesity (BMI 30-39.9) 08/15/2018  . Skin tag 08/15/2018  . Malignant neoplasm of prostate ([place]) 10/13/2017  . Rotator cuff  tear arthropathy of right shoulder 08/31/2017  . Personal history of gastric ulcer 02/09/2017    Carney Living PT DPT  05/10/2019, 2:43 PM  [facility] [address] [place], [place], [address] Phone: [phone]   Fax:  [phone]  Name: Hayven Nachman MRN: [id number] Date of Birth: 1955-11-24

## 2019-05-15 ENCOUNTER — Other Ambulatory Visit: Payer: Self-pay

## 2019-05-15 ENCOUNTER — Encounter: Payer: Self-pay | Admitting: Physical Therapy

## 2019-05-15 ENCOUNTER — Ambulatory Visit: Payer: Self-pay | Admitting: Physical Therapy

## 2019-05-15 DIAGNOSIS — M25612 Stiffness of left shoulder, not elsewhere classified: Secondary | ICD-10-CM

## 2019-05-15 DIAGNOSIS — M6281 Muscle weakness (generalized): Secondary | ICD-10-CM

## 2019-05-15 DIAGNOSIS — G8929 Other chronic pain: Secondary | ICD-10-CM

## 2019-05-15 NOTE — Therapy (Signed)
[place], [place], [address] Phone: [phone]   Fax:  [phone]  Physical Therapy Treatment  Patient Details  Name: Roy Reed MRN: [id number] Date of Birth: 1955/12/20 Referring Provider (PT): Dr Frankey Shown    Encounter Date: 05/15/2019  PT End of Session - 05/15/19 0913    Visit Number  14    Number of Visits  22    Date for PT Re-Evaluation  05/31/19    Authorization Type  CAFA     PT Start Time  0845    PT Stop Time  0928    PT Time Calculation (min)  43 min    Activity Tolerance  Patient tolerated treatment well    Behavior During Therapy  [facility] for tasks assessed/performed       Past Medical History:  Diagnosis Date  . Elevated PSA   . Left knee pain   . Low back pain   . Pre-diabetes   . Prostate cancer ([place])   . Rotator cuff disorder, right     Past Surgical History:  Procedure Laterality Date  . COLONOSCOPY    . PROSTATE BIOPSY    . PROSTATE BIOPSY    . RADIOACTIVE SEED IMPLANT N/A 01/02/2018   Procedure: RADIOACTIVE SEED IMPLANT/BRACHYTHERAPY IMPLANT;  Surgeon: Cleon Gustin, MD;  Location: [facility];  Service: Urology;  Laterality: N/A;  . SHOULDER ARTHROSCOPY WITH ROTATOR CUFF REPAIR AND SUBACROMIAL DECOMPRESSION Right 08/05/2017   Procedure: RIGHT SHOULDER ARTHROSCOPY WITH ROTATOR CUFF REPAIR, DISTAL CLAVICLE EXCISION, DEBRIDEMENT AND SUBACROMIAL DECOMPRESSION;  Surgeon: Leandrew Koyanagi, MD;  Location: [place];  Service: Orthopedics;  Laterality: Right;  . SPACE OAR INSTILLATION N/A 01/02/2018   Procedure: SPACE OAR INSTILLATION;  Surgeon: Cleon Gustin, MD;  Location: [facility];  Service: Urology;  Laterality: N/A;    There were no vitals filed for this visit.  Subjective Assessment - 05/15/19 0851    Subjective  The patient is mostly having pain when he is reaching up and sleeping on his shoulder. This is  about the ame that it has been.    Patient is accompained by:  Interpreter   [phone] [name] French   Limitations  Lifting;Other (comment)    Diagnostic tests  MRI: No tear but tendinitis of suprapsinatus and ifraspinatus; also inflammation of the bicpes     Currently in Pain?  No/denies                       [facility] Adult PT Treatment/Exercise - 05/15/19 0001      Shoulder Exercises: Supine   Protraction  20 reps;Strengthening    Protraction Weight (lbs)  3    Protraction Limitations  2ith dowel    Flexion  Both;Strengthening;20 reps    Shoulder Flexion Weight (lbs)  3    Flexion Limitations  with dowel      Shoulder Exercises: Prone   Retraction  20 reps    Retraction Weight (lbs)  4    Extension  20 reps    Extension Weight (lbs)  4      Shoulder Exercises: Standing   External Rotation  Theraband;20 reps   2x10   Theraband Level (Shoulder External Rotation)  Level 3 (Green)    Internal Rotation  20 reps    Theraband Level (Shoulder Internal Rotation)  Level 4 (Blue)    Extension  20 reps    Theraband Level (Shoulder Extension)  Other (comment)   black    Row  20 reps    Theraband Level (Shoulder Row)  Other (comment)   black   Other Standing Exercises  shlef reach 2lb 2x10     Other Standing Exercises  door way stretch 5x10 sec hold       Shoulder Exercises: ROM/Strengthening   UBE (Upper Arm Bike)  3 min posterior       Shoulder Exercises: Stretch   Other Shoulder Stretches  pec stretch in door       Manual Therapy   Manual Therapy  Passive ROM;Joint mobilization;Soft tissue mobilization    Manual therapy comments  soft tissue mobilization to upper traps     Joint Mobilization  PA glide and inferio grade III and IV, sidelying scapular mobilization with flexion x15 mod verbal cuing for straight flexion    Soft tissue mobilization  trigger point release to upper traps; superior glide of distal clavilce. and left pec     Passive ROM  into flexion,  abduction, and ER              PT Education - 05/15/19 0853    Education Details  reviewed importance of continued stretching    Person(s) Educated  Patient    Methods  Explanation;Demonstration;Tactile cues;Verbal cues    Comprehension  Verbalized understanding;Returned demonstration;Verbal cues required;Tactile cues required       PT Short Term Goals - 05/15/19 0953      PT SHORT TERM GOAL #1   Title  Patient will demosntrate full passive left shoulder flexion without pain     Baseline  improved but still limited    Time  3    Period  Weeks    Status  On-going      PT SHORT TERM GOAL #2   Title  Patient will demostrate 5/5 gross left shoulder strength     Baseline  improving but still limoted in flexion and ER    Time  3    Period  Weeks    Status  On-going      PT SHORT TERM GOAL #3   Title  Patient will improve active left shoulder flexion and abduction     Baseline  improved significantly since eval but still limited    Time  4    Status  On-going    Target Date  04/25/19      PT SHORT TERM GOAL #4   Title  Patient will demostrate 3/5 right shoulder flexion strength below 90 degrees     Baseline  4/5 to 120 dgrees     Time  4    Period  Weeks    Status  On-going        PT Long Term Goals - 04/04/19 0905      PT LONG TERM GOAL #1   Title  Patient will sleep through the night without pain     Baseline  rechaning behind his head after stretching     Time  8    Period  Weeks    Status  On-going      PT LONG TERM GOAL #2   Title  Patient will return to tennis without left shoulder pain     Baseline  has returned to tennis    Time  8    Period  Weeks    Status  On-going            Plan - 05/15/19 0914    Clinical  Impression Statement  Patient continues to be about the same. His range is near end range after stretching but is limited when he comes in . He has minor pain with stretching. He fatigues with exercises. He will have 1 more visit.We  will likely D/C to HEP and potentially send him back to the MD.    Personal Factors and Comorbidities  Comorbidity 2    Comorbidities  low back pain. Right shoulder RTC repai 2019.     Examination-Activity Limitations  Caring for Others;Carry;Sleep;Hygiene/Grooming    Examination-Participation Restrictions  Cleaning;Community Activity;Yard Work    Stability/Clinical Decision Making  Stable/Uncomplicated    Clinical Decision Making  Low    Rehab Potential  Good    PT Duration  6 weeks    PT Treatment/Interventions  ADLs/Self Care Home Management;Cryotherapy;Dentist;Therapeutic activities;Therapeutic exercise;Neuromuscular re-education;Patient/family education;Manual techniques;Passive range of motion;Dry needling;Taping    PT Next Visit Plan  continue with modalities if benenficial. Continue to perfrom HEP; continue with manual therapy    PT Home Exercise Plan  wand flexion; scap retraction; shoulder extension , wand ER    Consulted and Agree with Plan of Care  Patient       Patient will benefit from skilled therapeutic intervention in order to improve the following deficits and impairments:  Pain, Decreased activity tolerance, Decreased endurance, Decreased range of motion, Decreased strength, Impaired UE functional use, Increased muscle spasms  Visit Diagnosis: Stiffness of left shoulder, not elsewhere classified  Muscle weakness (generalized)  Chronic left shoulder pain     Problem List Patient Active Problem List   Diagnosis Date Noted  . Chronic left shoulder pain 09/05/2018  . Hyperlipidemia 08/15/2018  . Obesity (BMI 30-39.9) 08/15/2018  . Skin tag 08/15/2018  . Malignant neoplasm of prostate ([place]) 10/13/2017  . Rotator cuff tear arthropathy of right shoulder 08/31/2017  . Personal history of gastric ulcer 02/09/2017    Carney Living PT DPT  05/15/2019, 10:00 AM  [facility] [address] [place], [place], [address] Phone: [phone]   Fax:  [phone]  Name: Elior Aloi MRN: [id number] Date of Birth: Jun 27, 1956

## 2019-05-17 ENCOUNTER — Other Ambulatory Visit: Payer: Self-pay

## 2019-05-17 ENCOUNTER — Ambulatory Visit: Payer: Self-pay | Admitting: Physical Therapy

## 2019-05-17 ENCOUNTER — Encounter: Payer: Self-pay | Admitting: Physical Therapy

## 2019-05-17 DIAGNOSIS — M25512 Pain in left shoulder: Secondary | ICD-10-CM

## 2019-05-17 DIAGNOSIS — M6281 Muscle weakness (generalized): Secondary | ICD-10-CM

## 2019-05-17 DIAGNOSIS — M25612 Stiffness of left shoulder, not elsewhere classified: Secondary | ICD-10-CM

## 2019-05-17 DIAGNOSIS — G8929 Other chronic pain: Secondary | ICD-10-CM

## 2019-05-17 NOTE — Therapy (Signed)
[place], [place], [address] Phone: [phone]   Fax:  [phone]  Physical Therapy Treatment/Discharge  Patient Details  Name: Roy Reed MRN: [id number] Date of Birth: 1955-12-26 Referring Provider (PT): Dr Frankey Shown    Encounter Date: 05/17/2019  PT End of Session - 05/17/19 0921    Visit Number  15    Number of Visits  22    Date for PT Re-Evaluation  05/31/19    Authorization Type  CAFA     PT Start Time  0845    PT Stop Time  0925    PT Time Calculation (min)  40 min    Activity Tolerance  Patient tolerated treatment well    Behavior During Therapy  [facility] for tasks assessed/performed       Past Medical History:  Diagnosis Date  . Elevated PSA   . Left knee pain   . Low back pain   . Pre-diabetes   . Prostate cancer ([place])   . Rotator cuff disorder, right     Past Surgical History:  Procedure Laterality Date  . COLONOSCOPY    . PROSTATE BIOPSY    . PROSTATE BIOPSY    . RADIOACTIVE SEED IMPLANT N/A 01/02/2018   Procedure: RADIOACTIVE SEED IMPLANT/BRACHYTHERAPY IMPLANT;  Surgeon: Cleon Gustin, MD;  Location: [facility];  Service: Urology;  Laterality: N/A;  . SHOULDER ARTHROSCOPY WITH ROTATOR CUFF REPAIR AND SUBACROMIAL DECOMPRESSION Right 08/05/2017   Procedure: RIGHT SHOULDER ARTHROSCOPY WITH ROTATOR CUFF REPAIR, DISTAL CLAVICLE EXCISION, DEBRIDEMENT AND SUBACROMIAL DECOMPRESSION;  Surgeon: Leandrew Koyanagi, MD;  Location: [place];  Service: Orthopedics;  Laterality: Right;  . SPACE OAR INSTILLATION N/A 01/02/2018   Procedure: SPACE OAR INSTILLATION;  Surgeon: Cleon Gustin, MD;  Location[facility];  Service: Urology;  Laterality: N/A;    There were no vitals filed for this visit.  Subjective Assessment - 05/17/19 0901    Subjective  The patient is still having pain when he is reaching and when he sleeps on his shoulder.  He has no pain at this time.    Patient is accompained by:  Interpreter   [id number] Davina Poke Stratus   Limitations  Lifting;Other (comment)    Diagnostic tests  MRI: No tear but tendinitis of suprapsinatus and ifraspinatus; also inflammation of the bicpes     Currently in Pain?  No/denies    Pain Score  3     Pain Location  Shoulder    Pain Orientation  Left;Anterior    Pain Descriptors / Indicators  Aching    Pain Type  Chronic pain    Aggravating Factors   use of the shoulder    Pain Relieving Factors  rest    Effect of Pain on Daily Activities  difficulty using the arm         [facility] PT Assessment - 05/17/19 0001      AROM   Right Shoulder Flexion  135 Degrees    Right Shoulder Internal Rotation  --   to L3 with pain    Right Shoulder External Rotation  --   can reach behind his head      PROM   Left Shoulder Flexion  150 Degrees    Left Shoulder External Rotation  60 Degrees      Strength   Left Shoulder Flexion  4+/5    Left Shoulder Internal Rotation  3+/5    Left Shoulder External  Rotation  5/5                   [facility] Adult PT Treatment/Exercise - 05/17/19 0001      Self-Care   Other Self-Care Comments   reviewed progression of activity and the improtance of perfroming HEP at home       Shoulder Exercises: Supine   External Rotation  Left;10 reps    External Rotation Limitations  reviewed for home with a cane.     Flexion  Both;Strengthening;20 reps    Flexion Limitations  with dowelreviewed for HEP      Shoulder Exercises: Standing   External Rotation  Theraband;20 reps   2x10   Theraband Level (Shoulder External Rotation)  Level 3 (Green)    Internal Rotation  20 reps    Theraband Level (Shoulder Internal Rotation)  Level 4 (Blue)    Extension  20 reps    Theraband Level (Shoulder Extension)  Other (comment)   black    Row  20 reps    Theraband Level (Shoulder Row)  Other (comment)   black   Other Standing Exercises  shlef reach 2lb 2x10      Other Standing Exercises  door way stretch 5x10 sec hold       Shoulder Exercises: ROM/Strengthening   UBE (Upper Arm Bike)  3 min posterior       Manual Therapy   Manual Therapy  Passive ROM;Joint mobilization;Soft tissue mobilization    Manual therapy comments  soft tissue mobilization to upper traps     Joint Mobilization  PA glide and inferio grade III and IV, sidelying scapular mobilization with flexion x15 mod verbal cuing for straight flexion    Soft tissue mobilization  trigger point release to upper traps; superior glide of distal clavilce. and left pec     Passive ROM  into flexion, abduction, and ER              PT Education - 05/17/19 1047    Education Details  reviewed HEP and symptom manegement       PT Short Term Goals - 05/15/19 0953      PT SHORT TERM GOAL #1   Title  Patient will demosntrate full passive left shoulder flexion without pain     Baseline  improved but still limited    Time  3    Period  Weeks    Status  On-going      PT SHORT TERM GOAL #2   Title  Patient will demostrate 5/5 gross left shoulder strength     Baseline  improving but still limoted in flexion and ER    Time  3    Period  Weeks    Status  On-going      PT SHORT TERM GOAL #3   Title  Patient will improve active left shoulder flexion and abduction     Baseline  improved significantly since eval but still limited    Time  4    Status  On-going    Target Date  04/25/19      PT SHORT TERM GOAL #4   Title  Patient will demostrate 3/5 right shoulder flexion strength below 90 degrees     Baseline  4/5 to 120 dgrees     Time  4    Period  Weeks    Status  On-going        PT Long Term Goals - 04/04/19 7494  PT LONG TERM GOAL #1   Title  Patient will sleep through the night without pain     Baseline  rechaning behind his head after stretching     Time  8    Period  Weeks    Status  On-going      PT LONG TERM GOAL #2   Title  Patient will return to tennis  without left shoulder pain     Baseline  has returned to tennis    Time  8    Period  Weeks    Status  On-going            Plan - 05/17/19 0924    Clinical Impression Statement  Patient has made good progress but over the past 3-4 weeks he has not made much progress. At this time his active flexion has improved to a functional level. He is still limited in active and passive external rotation. His strength has improved but his flexion is still limited.    Personal Factors and Comorbidities  Comorbidity 2    Comorbidities  low back pain. Right shoulder RTC repai 2019.     Examination-Activity Limitations  Caring for Others;Carry;Sleep;Hygiene/Grooming    Examination-Participation Restrictions  Cleaning;Community Activity;Yard Work    Stability/Clinical Decision Making  Stable/Uncomplicated    Clinical Decision Making  Low    Rehab Potential  Good    PT Frequency  2x / week    PT Duration  6 weeks    PT Treatment/Interventions  ADLs/Self Care Home Management;Cryotherapy;Dentist;Therapeutic activities;Therapeutic exercise;Neuromuscular re-education;Patient/family education;Manual techniques;Passive range of motion;Dry needling;Taping    PT Next Visit Plan  continue with modalities if benenficial. Continue to perfrom HEP; continue with manual therapy    PT Home Exercise Plan  wand flexion; scap retraction; shoulder extension , wand ER    Consulted and Agree with Plan of Care  Patient       Patient will benefit from skilled therapeutic intervention in order to improve the following deficits and impairments:  Pain, Decreased activity tolerance, Decreased endurance, Decreased range of motion, Decreased strength, Impaired UE functional use, Increased muscle spasms  Visit Diagnosis: Stiffness of left shoulder, not elsewhere classified  Muscle weakness (generalized)  Chronic left shoulder pain  PHYSICAL THERAPY DISCHARGE  SUMMARY  Visits from Start of Care: 15  Current functional level related to goals / functional outcomes: Improved functional use of his shoulder    Remaining deficits: Pain at end ranges. Pain sleeping on his shoulder    Education / Equipment: HEP  Plan: Patient agrees to discharge.  Patient goals were met. Patient is being discharged due to meeting the stated rehab goals.  ?????       Problem List Patient Active Problem List   Diagnosis Date Noted  . Chronic left shoulder pain 09/05/2018  . Hyperlipidemia 08/15/2018  . Obesity (BMI 30-39.9) 08/15/2018  . Skin tag 08/15/2018  . Malignant neoplasm of prostate ([place]) 10/13/2017  . Rotator cuff tear arthropathy of right shoulder 08/31/2017  . Personal history of gastric ulcer 02/09/2017    Carney Living PT DPT  05/17/2019, 12:29 PM  [facility] [address] [place], [place], [address] Phone: [phone]   Fax:  [phone]  Name: Roy Reed MRN: [id number] Date of Birth: April 28, 1956

## 2019-05-25 MED FILL — ?ATORVASTATIN 10 MG TABLET: 10 | 30 days supply | Qty: 30 | Fill #2

## 2019-06-08 MED FILL — TAMSULOSIN HCL 0.4 MG CAP: 0.4 | 30 days supply | Qty: 30 | Fill #8

## 2019-06-12 ENCOUNTER — Ambulatory Visit (INDEPENDENT_AMBULATORY_CARE_PROVIDER_SITE_OTHER): Payer: Self-pay | Admitting: Orthopaedic Surgery

## 2019-06-12 ENCOUNTER — Other Ambulatory Visit: Payer: Self-pay

## 2019-06-12 ENCOUNTER — Ambulatory Visit (INDEPENDENT_AMBULATORY_CARE_PROVIDER_SITE_OTHER): Payer: Self-pay

## 2019-06-12 ENCOUNTER — Encounter: Payer: Self-pay | Admitting: Orthopaedic Surgery

## 2019-06-12 DIAGNOSIS — G8929 Other chronic pain: Secondary | ICD-10-CM

## 2019-06-12 DIAGNOSIS — M545 Low back pain, unspecified: Secondary | ICD-10-CM

## 2019-06-12 DIAGNOSIS — M25561 Pain in right knee: Secondary | ICD-10-CM

## 2019-06-12 DIAGNOSIS — M25512 Pain in left shoulder: Secondary | ICD-10-CM

## 2019-06-12 MED ORDER — LIDOCAINE HCL 1 % IJ SOLN
2.0000 mL | INTRAMUSCULAR | Status: AC | PRN
  Administered 2019-06-12: 2 mL

## 2019-06-12 MED ORDER — METHOCARBAMOL 500 MG PO TABS: 500.0000 mg | ORAL_TABLET | Freq: Two times a day (BID) | ORAL | 0 refills | Status: DC | PRN

## 2019-06-12 MED ORDER — METHYLPREDNISOLONE ACETATE 40 MG/ML IJ SUSP
40.0000 mg | INTRAMUSCULAR | Status: AC | PRN
  Administered 2019-06-12: 40 mg via INTRA_ARTICULAR

## 2019-06-12 MED ORDER — BUPIVACAINE HCL 0.25 % IJ SOLN
2.0000 mL | INTRAMUSCULAR | Status: AC | PRN
  Administered 2019-06-12: 2 mL via INTRA_ARTICULAR

## 2019-06-12 NOTE — Progress Notes (Signed)
Office Visit Note   Patient: Roy Reed           Date of Birth: [birth year]           MRN: [id number] Visit Date: 06/12/2019              Requested by: Ladell Pier, MD [address] [place],  [place] [address] PCP: Ladell Pier, MD   Assessment & Plan: Visit Diagnoses:  1. Chronic pain of right knee   2. Chronic left-sided low back pain, unspecified whether sciatica present   3. 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  #2, right knee arthritis.  #3 chronic lower back pain.  In regards to the left shoulder, he will continue with his home exercise program.  He will let us know if he regresses.  In regards to the right knee, I injected this with cortisone today.  He will follow-up with [name] as needed.  In regards to the lumbar spine, I have started him in physical therapy.  Follow-up with [name] as needed.  This entire encounter was discussed with an interpreter.  Follow-Up Instructions: Return if symptoms worsen or fail to improve.   Orders:  Orders Placed This Encounter  Procedures  . Large Joint Inj: R knee  . XR Lumbar Spine 2-3 Views  . XR KNEE 3 VIEW RIGHT  . Ambulatory referral to Physical Therapy   Meds ordered this encounter  Medications  . methocarbamol (ROBAXIN) 500 MG tablet    Sig: Take 1 tablet (500 mg total) by mouth 2 (two) times daily as needed.    Dispense:  20 tablet    Refill:  0      Procedures: Large Joint Inj: R knee on 06/12/2019 10:10 AM Indications: pain Details: 22 G needle, anterolateral approach Medications: 2 mL bupivacaine 0.25 %; 2 mL lidocaine 1 %; 40 mg methylPREDNISolone acetate 40 MG/ML      Clinical Data: No additional findings.   Subjective: Chief Complaint  Patient presents with  . Left Shoulder - Pain  . Right Knee - Pain  . Lower Back - Pain    HPI patient is a pleasant 63 year old gentleman who presents our clinic today for  follow-up of his left shoulder and for right knee and left lower back pain.  In regards to the left shoulder, he has been dealing with AC arthropathy and adhesive capsulitis since February of this past year.  He has had both subacromial and AC joint cortisone injections.  He has recently finished formal physical therapy where he has regained near full range of motion.  He has minimal pain to the left shoulder.  Overall doing much better.  In regards to the right knee, he has had pain here for the past month.  No known injury.  The pain he has is to the medial aspect.  No mechanical symptoms.  His pain is worse with ambulation and activity.  He has tried over-the-counter creams without relief of symptoms.  No previous injection or surgical intervention to the right knee.  In regards to the left lower back, he has had pain here for several years and has recently worsened.  The pain is described as a dull ache worse with standing.  No radicular symptoms.  No lower extremity weakness.  No previous physical therapy or cortisone injection to the lumbar spine.  No bowel or bladder change and no saddle paresthesias.  Review of Systems as detailed in HPI.  All others reviewed and are negative.   Objective: Vital Signs: There were no vitals taken for this visit.  Physical Exam well-developed and well-nourished gentleman in no acute distress.  Alert and oriented x3.  Ortho Exam examination of his left shoulder reveals full active range of motion in all planes.  He is neurovascularly intact distally.  Right knee exam shows no effusion.  Range of motion 0 to 120 degrees.  Mild medial joint line tenderness.  Ligaments are stable.  He is neurovascular intact distally.  Lumbar exam shows mild paraspinal tenderness on the left.  Increased pain with lumbar flexion and extension.  Negative straight leg raise and negative logroll.  He is neurovascularly intact distally.  Specialty Comments:  No specialty comments  available.  Imaging: Xr Knee 3 View Right  Result Date: 06/12/2019 Mild to moderate degenerative changes throughout  Xr Lumbar Spine 2-3 Views  Result Date: 06/12/2019 Mild degenerative changes L4-5 and L5-S1    PMFS History: Patient Active Problem List   Diagnosis Date Noted  . Chronic left shoulder pain 09/05/2018  . Hyperlipidemia 08/15/2018  . Obesity (BMI 30-39.9) 08/15/2018  . Skin tag 08/15/2018  . Malignant neoplasm of prostate ([place]) 10/13/2017  . Rotator cuff tear arthropathy of right shoulder 08/31/2017  . Personal history of gastric ulcer 02/09/2017   Past Medical History:  Diagnosis Date  . Elevated PSA   . Left knee pain   . Low back pain   . Pre-diabetes   . Prostate cancer ([place])   . Rotator cuff disorder, right     History reviewed. No pertinent family history.  Past Surgical History:  Procedure Laterality Date  . COLONOSCOPY    . PROSTATE BIOPSY    . PROSTATE BIOPSY    . RADIOACTIVE SEED IMPLANT N/A 01/02/2018   Procedure: RADIOACTIVE SEED IMPLANT/BRACHYTHERAPY IMPLANT;  Surgeon: Cleon Gustin, MD;  Location: [facility];  Service: Urology;  Laterality: N/A;  . SHOULDER ARTHROSCOPY WITH ROTATOR CUFF REPAIR AND SUBACROMIAL DECOMPRESSION Right 08/05/2017   Procedure: RIGHT SHOULDER ARTHROSCOPY WITH ROTATOR CUFF REPAIR, DISTAL CLAVICLE EXCISION, DEBRIDEMENT AND SUBACROMIAL DECOMPRESSION;  Surgeon: Leandrew Koyanagi, MD;  Location: [facility];  Service: Orthopedics;  Laterality: Right;  . SPACE OAR INSTILLATION N/A 01/02/2018   Procedure: SPACE OAR INSTILLATION;  Surgeon: Cleon Gustin, MD;  Location: [facility];  Service: Urology;  Laterality: N/A;   Social History   Occupational History  . Not on file  Tobacco Use  . Smoking status: Never Smoker  . Smokeless tobacco: Never Used  Substance and Sexual Activity  . Alcohol use: Not Currently  . Drug use: No  . Sexual activity: Not on file

## 2019-06-20 ENCOUNTER — Encounter: Payer: Self-pay | Admitting: Physical Therapy

## 2019-06-20 ENCOUNTER — Ambulatory Visit: Payer: Self-pay | Attending: Orthopaedic Surgery | Admitting: Physical Therapy

## 2019-06-20 ENCOUNTER — Other Ambulatory Visit (HOSPITAL_COMMUNITY)
Admission: RE | Admit: 2019-06-20 | Discharge: 2019-06-20 | Disposition: A | Discharge status: Home / Self Care | Payer: Self-pay | Source: Ambulatory Visit | Attending: Urology | Admitting: Urology

## 2019-06-20 ENCOUNTER — Other Ambulatory Visit: Payer: Self-pay

## 2019-06-20 ENCOUNTER — Ambulatory Visit (INDEPENDENT_AMBULATORY_CARE_PROVIDER_SITE_OTHER): Payer: Self-pay | Admitting: Urology

## 2019-06-20 DIAGNOSIS — C61 Malignant neoplasm of prostate: Secondary | ICD-10-CM | POA: Insufficient documentation

## 2019-06-20 DIAGNOSIS — M545 Low back pain: Secondary | ICD-10-CM | POA: Insufficient documentation

## 2019-06-20 DIAGNOSIS — G8929 Other chronic pain: Secondary | ICD-10-CM | POA: Insufficient documentation

## 2019-06-20 DIAGNOSIS — R351 Nocturia: Secondary | ICD-10-CM

## 2019-06-20 LAB — PSA: Prostatic Specific Antigen: <0.01 ng/mL (ref 0.00–4.00)

## 2019-06-20 MED FILL — SILDENAFIL CITRATE 100 MG T: 100 | 30 days supply | Qty: 10 | Fill #0

## 2019-06-20 MED FILL — ALFUZOSIN HCL ER 10 MG TAB: 10 | 30 days supply | Qty: 30 | Fill #0

## 2019-06-20 NOTE — Patient Instructions (Signed)
Access Code: [email or web address]  URL: [email or web address]  Date: 06/20/2019  Prepared by: Elsie Ra   Exercises  Supine Piriformis Stretch - 3 reps - 1 sets - 30 hold - 2x daily - 6x weekly  Supine Hamstring Stretch with Strap - 3 reps - 1 sets - 30 hold - 2x daily - 6x weekly  Supine Lower Trunk Rotation - 2-3 reps - 1 sets - 10 hold - 2x daily - 6x weekly  Supine Bridge - 10 reps - 1-2 sets - 5 hold - 2x daily - 6x weekly  Dead Bug - 10 reps - 3 sets - 2x daily - 6x weekly

## 2019-06-21 NOTE — Therapy (Signed)
[place], [place], [address] Phone: [phone]   Fax:  [phone]  Physical Therapy Evaluation  Patient Details  Name: Roy Reed MRN: [id number] Date of Birth: May 02, 1956 Referring Provider (PT): Leandrew Koyanagi, MD   Encounter Date: 06/20/2019  PT End of Session - 06/21/19 1621    Visit Number  1    Number of Visits  12    Date for PT Re-Evaluation  08/02/19    Authorization Type  CAFA  until 09/10/18    PT Start Time  1445    PT Stop Time  1530    PT Time Calculation (min)  45 min    Activity Tolerance  Patient tolerated treatment well    Behavior During Therapy  [facility] for tasks assessed/performed       Past Medical History:  Diagnosis Date  . Elevated PSA   . Left knee pain   . Low back pain   . Pre-diabetes   . Prostate cancer ([place])   . Rotator cuff disorder, right     Past Surgical History:  Procedure Laterality Date  . COLONOSCOPY    . PROSTATE BIOPSY    . PROSTATE BIOPSY    . RADIOACTIVE SEED IMPLANT N/A 01/02/2018   Procedure: RADIOACTIVE SEED IMPLANT/BRACHYTHERAPY IMPLANT;  Surgeon: Cleon Gustin, MD;  Location: [facility];  Service: Urology;  Laterality: N/A;  . SHOULDER ARTHROSCOPY WITH ROTATOR CUFF REPAIR AND SUBACROMIAL DECOMPRESSION Right 08/05/2017   Procedure: RIGHT SHOULDER ARTHROSCOPY WITH ROTATOR CUFF REPAIR, DISTAL CLAVICLE EXCISION, DEBRIDEMENT AND SUBACROMIAL DECOMPRESSION;  Surgeon: Leandrew Koyanagi, MD;  Location: [facility];  Service: Orthopedics;  Laterality: Right;  . SPACE OAR INSTILLATION N/A 01/02/2018   Procedure: SPACE OAR INSTILLATION;  Surgeon: Cleon Gustin, MD;  Location: [facility];  Service: Urology;  Laterality: N/A;    There were no vitals filed for this visit.   Subjective Assessment - 06/20/19 1457    Subjective  Pt relays LBP for over 10 years that started when playing tennis.    Pertinent  History  was seen for recent PT with shoulder pain    How long can you stand comfortably?  10 min    How long can you walk comfortably?  10-20  min    Diagnostic tests  lumbar XR Mild degenerative changes L4-5 and L5-S1    Patient Stated Goals  be able to play tennis and decrease pain    Currently in Pain?  Yes    Pain Score  8     Pain Location  Back    Pain Orientation  Lower;Left    Pain Descriptors / Indicators  Aching;Burning    Pain Type  Chronic pain    Pain Radiating Towards  denies    Pain Onset  More than a month ago    Pain Frequency  Intermittent    Aggravating Factors   standing more than 10 min playing tennis    Pain Relieving Factors  sitting down, rest    Multiple Pain Sites  No         [facility] PT Assessment - 06/21/19 0001      Assessment   Medical Diagnosis  Chronic left-sided low back pain, unspecified whether sciatica present    Referring Provider (PT)  Leandrew Koyanagi, MD    Onset Date/Surgical Date  --   10 year onset of pain   Next MD Visit  nothing scheduled  Prior Therapy  recent PT for shoulder      Precautions   Precautions  None      Balance Screen   Has the patient fallen in the past 6 months  No      Prior Function   Level of Independence  Independent      Cognition   Overall Cognitive Status  Within Functional Limits for tasks assessed      Observation/Other Assessments   Focus on Therapeutic Outcomes (FOTO)   not done, needs french interpreter and did not have time, consider doing next sesison      Sensation   Light Touch  Appears Intact      Coordination   Gross Motor Movements are Fluid and Coordinated  Yes      AROM   AROM Assessment Site  Lumbar    Lumbar Flexion  90%    Lumbar Extension  60%    Lumbar - Right Side Bend  50%    Lumbar - Left Side Bend  50%    Lumbar - Right Rotation  60%   Lumbar - Left Rotation  60%      Strength   Strength Assessment Site  --   bilat leg strength overall WNL tested grossly in sitting      Flexibility   Soft Tissue Assessment /Muscle Length  --   tight H.S, glutes, lumbar P.S.     Palpation   Spinal mobility  moderately decreased and pain over Lt SI joint    Palpation comment  TTP around SI joint      Special Tests   Other special tests  neg SLR, mild neural tension with slump test, mild pain with quadrant, + SI joint tests on Lt     Transfers   Transfers  Independent with all Transfers      Ambulation/Gait   Gait Comments  WFL                Objective measurements completed on examination: See above findings.              PT Education - 06/21/19 1621    Education Details  HEP, POC, exam findings    Methods  Explanation;Demonstration;Verbal cues;Handout    Comprehension  Verbalized understanding;Need further instruction       PT Short Term Goals - 05/15/19 0953      PT SHORT TERM GOAL #1   Title  Patient will demosntrate full passive left shoulder flexion without pain     Baseline  improved but still limited    Time  3    Period  Weeks    Status  On-going      PT SHORT TERM GOAL #2   Title  Patient will demostrate 5/5 gross left shoulder strength     Baseline  improving but still limoted in flexion and ER    Time  3    Period  Weeks    Status  On-going      PT SHORT TERM GOAL #3   Title  Patient will improve active left shoulder flexion and abduction     Baseline  improved significantly since eval but still limited    Time  4    Status  On-going    Target Date  04/25/19      PT SHORT TERM GOAL #4   Title  Patient will demostrate 3/5 right shoulder flexion strength below 90 degrees     Baseline  4/5 to 120 dgrees  Time  4    Period  Weeks    Status  On-going        PT Long Term Goals - 06/21/19 1631      PT LONG TERM GOAL #1   Title  Patient will sleep through the night without back pain (target for all LTG 08/02/19)    Time  6    Period  Weeks    Status  New      PT LONG TERM GOAL #2   Title  Patient  will return to tennis with only mild back pain less than 3/10.    Time  6    Period  Weeks    Status  New      PT LONG TERM GOAL #3   Title  Pt will be able to stand >1 hour with only mild pain 3/10    Time  6    Period  Weeks    Status  New             Plan - 06/21/19 1624    Clinical Impression Statement  Pt seen earlier in the year for shoulder pain but not presents for evaluation of Chronic left-sided low back pain. No signs of radiculopathy and special testing negative except for some positive SI joint tests. He had no changes in symptoms with repeated flexion or extension. He does have decreased lumbar ROM, increased lumbar and hip tightness and  signs of lower lumbar/Left SI joint pain/dysfunciton/arthropathy. He will benefit from skilled PT to address his deficits.    Personal Factors and Comorbidities  Comorbidity 2    Comorbidities  low back pain. Right shoulder RTC repai 2019.     Examination-Activity Limitations  Caring for Others;Carry;Sleep;Hygiene/Grooming    Examination-Participation Restrictions  Cleaning;Community Activity;Yard Work    Stability/Clinical Decision Making  Stable/Uncomplicated    Clinical Decision Making  Low    Rehab Potential  Good    PT Frequency  2x / week    PT Duration  6 weeks    PT Treatment/Interventions  ADLs/Self Care Home Management;Cryotherapy;Dentist;Therapeutic activities;Therapeutic exercise;Neuromuscular re-education;Patient/family education;Manual techniques;Passive range of motion;Dry needling;Taping;Aquatic Therapy;Iontophoresis 4mg /ml Dexamethasone;Spinal Manipulations;Joint Manipulations    PT Next Visit Plan  needs lumbar/hip stretching, core strength, consider spinal mobs/manip, long axis distraction or traction    PT Home Exercise Plan  Access Code: [email or web address], HSS, SKTC, dead bug, piriformis, LTR, bridges    Consulted and Agree with Plan of Care  Patient       Patient  will benefit from skilled therapeutic intervention in order to improve the following deficits and impairments:  Pain, Decreased activity tolerance, Decreased endurance, Decreased range of motion, Decreased strength, Impaired UE functional use, Increased muscle spasms  Visit Diagnosis: Chronic bilateral low back pain without sciatica     Problem List Patient Active Problem List   Diagnosis Date Noted  . Chronic left shoulder pain 09/05/2018  . Hyperlipidemia 08/15/2018  . Obesity (BMI 30-39.9) 08/15/2018  . Skin tag 08/15/2018  . Malignant neoplasm of prostate ([place]) 10/13/2017  . Rotator cuff tear arthropathy of right shoulder 08/31/2017  . Personal history of gastric ulcer 02/09/2017    Silvestre Mesi 06/21/2019, 4:36 PM  [facility] [address] [place], [place], [address] Phone: [phone]   Fax:  [phone]  Name: Roy Reed MRN: [id number] Date of Birth: 10/31/1955

## 2019-06-26 ENCOUNTER — Ambulatory Visit: Payer: Self-pay | Admitting: Physical Therapy

## 2019-06-26 ENCOUNTER — Other Ambulatory Visit: Payer: Self-pay

## 2019-06-26 ENCOUNTER — Encounter: Payer: Self-pay | Admitting: Physical Therapy

## 2019-06-26 ENCOUNTER — Other Ambulatory Visit: Payer: Self-pay | Admitting: Internal Medicine

## 2019-06-26 ENCOUNTER — Encounter: Payer: Self-pay | Admitting: Internal Medicine

## 2019-06-26 DIAGNOSIS — G8929 Other chronic pain: Secondary | ICD-10-CM

## 2019-06-26 MED FILL — ?ATORVASTATIN 10 MG TABLET: 10 | 30 days supply | Qty: 30 | Fill #0

## 2019-06-26 NOTE — Therapy (Signed)
[place], [place], [address] Phone: [phone]   Fax:  [phone]  Physical Therapy Treatment  Patient Details  Name: Roy Reed MRN: [id number] Date of Birth: 1956/01/14 Referring Provider (PT): Leandrew Koyanagi, MD   Encounter Date: 06/26/2019  PT End of Session - 06/26/19 1603    Visit Number  2    Number of Visits  12    Date for PT Re-Evaluation  08/02/19    Authorization Type  CAFA  until 09/10/18    PT Start Time  1446    PT Stop Time  1530    PT Time Calculation (min)  44 min    Activity Tolerance  Patient tolerated treatment well    Behavior During Therapy  [facility] for tasks assessed/performed       Past Medical History:  Diagnosis Date  . Elevated PSA   . Left knee pain   . Low back pain   . Pre-diabetes   . Prostate cancer ([place])   . Rotator cuff disorder, right     Past Surgical History:  Procedure Laterality Date  . COLONOSCOPY    . PROSTATE BIOPSY    . PROSTATE BIOPSY    . RADIOACTIVE SEED IMPLANT N/A 01/02/2018   Procedure: RADIOACTIVE SEED IMPLANT/BRACHYTHERAPY IMPLANT;  Surgeon: Cleon Gustin, MD;  Location: [facility];  Service: Urology;  Laterality: N/A;  . SHOULDER ARTHROSCOPY WITH ROTATOR CUFF REPAIR AND SUBACROMIAL DECOMPRESSION Right 08/05/2017   Procedure: RIGHT SHOULDER ARTHROSCOPY WITH ROTATOR CUFF REPAIR, DISTAL CLAVICLE EXCISION, DEBRIDEMENT AND SUBACROMIAL DECOMPRESSION;  Surgeon: Leandrew Koyanagi, MD;  Location: [facility];  Service: Orthopedics;  Laterality: Right;  . SPACE OAR INSTILLATION N/A 01/02/2018   Procedure: SPACE OAR INSTILLATION;  Surgeon: Cleon Gustin, MD;  Location: [facility];  Service: Urology;  Laterality: N/A;    There were no vitals filed for this visit.  Subjective Assessment - 06/26/19 1452    Subjective  Patient reports continued pain when he is standing. He is consistent with the  exercises.    Patient is accompained by:  Interpreter   Michaelle [phone]   Currently in Pain?  Yes    Pain Score  6     Pain Location  Back    Pain Orientation  Lower;Left    Pain Descriptors / Indicators  Aching    Pain Type  Chronic pain    Pain Onset  More than a month ago    Pain Frequency  Constant                       [facility] Adult PT Treatment/Exercise - 06/26/19 0001      Exercises   Exercises  Lumbar;Knee/Hip      Lumbar Exercises: Stretches   Lower Trunk Rotation  5 reps;10 seconds    Piriformis Stretch  30 seconds;2 reps;Right;Left    Piriformis Stretch Limitations  Supine      Lumbar Exercises: Seated   Sit to Stand  10 reps   2 sets, did not use UE     Lumbar Exercises: Supine   Dead Bug  10 reps   2 sets   Bridge  10 reps;3 seconds   2 sets     Lumbar Exercises: Sidelying   Clam  10 reps   2 sets     Lumbar Exercises: Quadruped   Opposite Arm/Leg Raise  10 reps;3 seconds   2 sets  PT Education - 06/26/19 1500    Education Details  HEP    Person(s) Educated  Patient    Methods  Explanation;Demonstration;Verbal cues;Handout    Comprehension  Verbalized understanding       PT Short Term Goals - 05/15/19 0953      PT SHORT TERM GOAL #1   Title  Patient will demosntrate full passive left shoulder flexion without pain     Baseline  improved but still limited    Time  3    Period  Weeks    Status  On-going      PT SHORT TERM GOAL #2   Title  Patient will demostrate 5/5 gross left shoulder strength     Baseline  improving but still limoted in flexion and ER    Time  3    Period  Weeks    Status  On-going      PT SHORT TERM GOAL #3   Title  Patient will improve active left shoulder flexion and abduction     Baseline  improved significantly since eval but still limited    Time  4    Status  On-going    Target Date  04/25/19      PT SHORT TERM GOAL #4   Title  Patient will demostrate 3/5 right shoulder  flexion strength below 90 degrees     Baseline  4/5 to 120 dgrees     Time  4    Period  Weeks    Status  On-going        PT Long Term Goals - 06/21/19 1631      PT LONG TERM GOAL #1   Title  Patient will sleep through the night without back pain (target for all LTG 08/02/19)    Time  6    Period  Weeks    Status  New      PT LONG TERM GOAL #2   Title  Patient will return to tennis with only mild back pain less than 3/10.    Time  6    Period  Weeks    Status  New      PT LONG TERM GOAL #3   Title  Pt will be able to stand >1 hour with only mild pain 3/10    Time  6    Period  Weeks    Status  New            Plan - 06/26/19 1604    Clinical Impression Statement  Patient tolerated therapy well with no adverse effects. He is progressing well with his core and hip strengthening exercises. He does continue to have lower back pain with activity but seems to be improved since last visit. He would benefit from continued skilled physical therapy to improve his motion and strength to reduce pain and maximize his functional ability.    PT Treatment/Interventions  ADLs/Self Care Home Management;Cryotherapy;Dentist;Therapeutic activities;Therapeutic exercise;Neuromuscular re-education;Patient/family education;Manual techniques;Passive range of motion;Dry needling;Taping;Aquatic Therapy;Iontophoresis 4mg /ml Dexamethasone;Spinal Manipulations;Joint Manipulations    PT Next Visit Plan  manual therapy as needed for lumbar and hip motion, progress lumbar motion, hip and core strengthening    PT Home Exercise Plan  Supine pirformis stretch, supine hamstring stretch with strap, supine lower trunk rotations, bridge, dead bug, clamshell, bird dog, sit-to-stand    Consulted and Agree with Plan of Care  Patient       Patient will benefit from skilled therapeutic intervention in order to  improve the following deficits and impairments:  Pain,  Decreased activity tolerance, Decreased endurance, Decreased range of motion, Decreased strength, Impaired UE functional use, Increased muscle spasms  Visit Diagnosis: Chronic bilateral low back pain without sciatica     Problem List Patient Active Problem List   Diagnosis Date Noted  . Chronic left shoulder pain 09/05/2018  . Hyperlipidemia 08/15/2018  . Obesity (BMI 30-39.9) 08/15/2018  . Skin tag 08/15/2018  . Malignant neoplasm of prostate ([place]) 10/13/2017  . Rotator cuff tear arthropathy of right shoulder 08/31/2017  . Personal history of gastric ulcer 02/09/2017    Hilda Blades, PT, DPT, LAT, ATC 06/26/19  4:15 PM Phone: [phone] Fax: [facility] [address] [place], [place], [address] Phone: [phone]   Fax:  [phone]  Name: Roy Reed MRN: [id number] Date of Birth: December 27, 1955

## 2019-06-27 MED ORDER — ATORVASTATIN CALCIUM 10 MG PO TABS: 10.0000 mg | ORAL_TABLET | Freq: Every day | ORAL | 5 refills | Status: DC

## 2019-06-28 ENCOUNTER — Ambulatory Visit: Payer: Self-pay | Admitting: Physical Therapy

## 2019-06-28 ENCOUNTER — Other Ambulatory Visit: Payer: Self-pay

## 2019-06-28 ENCOUNTER — Encounter: Payer: Self-pay | Admitting: Physical Therapy

## 2019-06-28 DIAGNOSIS — M545 Low back pain, unspecified: Secondary | ICD-10-CM

## 2019-06-28 DIAGNOSIS — G8929 Other chronic pain: Secondary | ICD-10-CM

## 2019-06-28 NOTE — Therapy (Signed)
[place], [place], [address] Phone: [phone]   Fax:  [phone]  Physical Therapy Treatment  Patient Details  Name: Roy Reed MRN: [id number] Date of Birth: [birth year] Referring Provider (PT): Leandrew Koyanagi, MD   Encounter Date: 06/28/2019  PT End of Session - 06/28/19 1057    Visit Number  3    Number of Visits  12    Date for PT Re-Evaluation  08/02/19    Authorization Type  CAFA  until 09/10/18    PT Start Time  1012    PT Stop Time  1045    PT Time Calculation (min)  33 min    Activity Tolerance  Patient tolerated treatment well    Behavior During Therapy  [facility] for tasks assessed/performed       Past Medical History:  Diagnosis Date  . Elevated PSA   . Left knee pain   . Low back pain   . Pre-diabetes   . Prostate cancer ([place])   . Rotator cuff disorder, right     Past Surgical History:  Procedure Laterality Date  . COLONOSCOPY    . PROSTATE BIOPSY    . PROSTATE BIOPSY    . RADIOACTIVE SEED IMPLANT N/A 01/02/2018   Procedure: RADIOACTIVE SEED IMPLANT/BRACHYTHERAPY IMPLANT;  Surgeon: Cleon Gustin, MD;  Location: [facility];  Service: Urology;  Laterality: N/A;  . SHOULDER ARTHROSCOPY WITH ROTATOR CUFF REPAIR AND SUBACROMIAL DECOMPRESSION Right 08/05/2017   Procedure: RIGHT SHOULDER ARTHROSCOPY WITH ROTATOR CUFF REPAIR, DISTAL CLAVICLE EXCISION, DEBRIDEMENT AND SUBACROMIAL DECOMPRESSION;  Surgeon: Leandrew Koyanagi, MD;  Location: [place];  Service: Orthopedics;  Laterality: Right;  . SPACE OAR INSTILLATION N/A 01/02/2018   Procedure: SPACE OAR INSTILLATION;  Surgeon: Cleon Gustin, MD;  Location: [facility];  Service: Urology;  Laterality: N/A;    There were no vitals filed for this visit.  Subjective Assessment - 06/28/19 1042    Subjective  Patient reports continued improvement. He is doing the exercises but has  difficulty with the bird dog exercise due to shoulder. He is able to stand and walk longer, he has not tried to play tennis recently. He notes some increased left neck/clavicle discomfort that may be related to sleeping on that shoulder.    Patient is accompained by:  Interpreter   Lelan Pons [phone]   Currently in Pain?  Yes    Pain Score  5     Pain Location  Back    Pain Orientation  Lower;Left    Pain Descriptors / Indicators  Aching;Tightness    Pain Type  Chronic pain    Pain Onset  More than a month ago    Pain Frequency  Constant         [facility] PT Assessment - 06/28/19 0001      AROM   AROM Assessment Site  Lumbar    Lumbar Flexion  90%    Lumbar Extension  60%    Lumbar - Right Rotation  75%    Lumbar - Left Rotation  75%      Strength   Strength Assessment Site  Hip    Right/Left Hip  Right;Left    Right Hip Extension  4/5    Right Hip ABduction  4-/5    Left Hip Extension  4-/5    Left Hip ABduction  4-/5  [place] Adult PT Treatment/Exercise - 06/28/19 0001      Exercises   Exercises  Lumbar;Knee/Hip      Lumbar Exercises: Stretches   Passive Hamstring Stretch  Right;Left;2 reps;30 seconds    Single Knee to Chest Stretch  Right;Left;2 reps;30 seconds    Lower Trunk Rotation  5 reps;10 seconds    Piriformis Stretch  Right;Left;2 reps;30 seconds    Piriformis Stretch Limitations  Supine    Other Lumbar Stretch Exercise  Side lying open book stretch 5x15 sec bilat      Lumbar Exercises: Seated   Sit to Stand  10 reps   2 sets without use of hands     Lumbar Exercises: Supine   Dead Bug  15 reps   3x5 each   Dead Bug Limitations  required tactile cueing for form    Bridge  10 reps;5 seconds             PT Education - 06/28/19 1056    Education Details  HEP, discontinue bird dog, neck stretching    Person(s) Educated  Patient    Methods  Explanation;Demonstration;Verbal cues;Handout    Comprehension  Verbalized  understanding;Verbal cues required;Need further instruction        PT Long Term Goals - 06/21/19 1631      PT LONG TERM GOAL #1   Title  Patient will sleep through the night without back pain (target for all LTG 08/02/19)    Time  6    Period  Weeks    Status  New      PT LONG TERM GOAL #2   Title  Patient will return to tennis with only mild back pain less than 3/10.    Time  6    Period  Weeks    Status  New      PT LONG TERM GOAL #3   Title  Pt will be able to stand >1 hour with only mild pain 3/10    Time  6    Period  Weeks    Status  New            Plan - 06/28/19 1059    Clinical Impression Statement  Patient tolerated therapy well and he seems to be continuing to improve. He continues to exhibit limited motion and strength impairments, but is progressing with his exercises. His is limited with some of his exercises due to shoulder pain. He would benefit from continued skilled physical therapy to improve his motion and strength to reduce pain and maximize his functional ability.    PT Treatment/Interventions  ADLs/Self Care Home Management;Cryotherapy;Dentist;Therapeutic activities;Therapeutic exercise;Neuromuscular re-education;Patient/family education;Manual techniques;Passive range of motion;Dry needling;Taping;Aquatic Therapy;Iontophoresis 4mg /ml Dexamethasone;Spinal Manipulations;Joint Manipulations    PT Next Visit Plan  manual therapy as needed for lumbar and hip motion, progress lumbar motion, hip and core strengthening    PT Home Exercise Plan  Supine pirformis stretch, supine hamstring stretch with strap, supine lower trunk rotations, bridge, dead bug, clamshell, sit-to-stand, side lying open book, left upper trap stretch    Consulted and Agree with Plan of Care  Patient       Patient will benefit from skilled therapeutic intervention in order to improve the following deficits and impairments:  Pain,  Decreased activity tolerance, Decreased endurance, Decreased range of motion, Decreased strength, Impaired UE functional use, Increased muscle spasms  Visit Diagnosis: Chronic bilateral low back pain without sciatica     Problem List Patient Active Problem List  Diagnosis Date Noted  . Chronic left shoulder pain 09/05/2018  . Hyperlipidemia 08/15/2018  . Obesity (BMI 30-39.9) 08/15/2018  . Skin tag 08/15/2018  . Malignant neoplasm of prostate ([place]) 10/13/2017  . Rotator cuff tear arthropathy of right shoulder 08/31/2017  . Personal history of gastric ulcer 02/09/2017    Hilda Blades, PT, DPT, LAT, ATC 06/28/19  11:19 AM Phone: [phone] Fax: [facility] [address] [place], [place], [address] Phone: [phone]   Fax:  [phone]  Name: Roy Reed MRN: [id number] Date of Birth: [birth year]

## 2019-06-28 NOTE — Patient Instructions (Signed)
Access Code: [email or web address]  URL: [email or web address]  Date: 06/28/2019  Prepared by: Hilda Blades   Exercises Gentle Levator Scapulae Stretch - 3 reps - 30 hold Sidelying Thoracic Lumbar Rotation - 10 reps - 10 hold

## 2019-06-29 ENCOUNTER — Ambulatory Visit: Payer: Self-pay | Attending: Internal Medicine | Admitting: Pharmacist

## 2019-06-29 DIAGNOSIS — Z23 Encounter for immunization: Secondary | ICD-10-CM

## 2019-06-29 NOTE — Progress Notes (Signed)
Patient presents for vaccination against influenza per orders of Dr. Johnson. Consent given. Counseling provided. No contraindications exists. Vaccine administered without incident.   

## 2019-07-03 ENCOUNTER — Other Ambulatory Visit: Payer: Self-pay

## 2019-07-03 ENCOUNTER — Encounter: Payer: Self-pay | Admitting: Physical Therapy

## 2019-07-03 ENCOUNTER — Ambulatory Visit: Payer: Self-pay | Admitting: Physical Therapy

## 2019-07-03 DIAGNOSIS — G8929 Other chronic pain: Secondary | ICD-10-CM

## 2019-07-03 NOTE — Therapy (Signed)
[place], [place], [address] Phone: [phone]   Fax:  [phone]  Physical Therapy Treatment  Patient Details  Name: Roy Reed MRN: [id number] Date of Birth: 02-01-1956 Referring Provider (PT): Leandrew Koyanagi, MD   Encounter Date: 07/03/2019  PT End of Session - 07/03/19 1028    Visit Number  4    Number of Visits  12    Date for PT Re-Evaluation  08/02/19    Authorization Type  CAFA  until 09/10/18    PT Start Time  1005    PT Stop Time  1045    PT Time Calculation (min)  40 min    Activity Tolerance  Patient tolerated treatment well    Behavior During Therapy  [facility] for tasks assessed/performed       Past Medical History:  Diagnosis Date  . Elevated PSA   . Left knee pain   . Low back pain   . Pre-diabetes   . Prostate cancer ([place])   . Rotator cuff disorder, right     Past Surgical History:  Procedure Laterality Date  . COLONOSCOPY    . PROSTATE BIOPSY    . PROSTATE BIOPSY    . RADIOACTIVE SEED IMPLANT N/A 01/02/2018   Procedure: RADIOACTIVE SEED IMPLANT/BRACHYTHERAPY IMPLANT;  Surgeon: Cleon Gustin, MD;  Location: [facility];  Service: Urology;  Laterality: N/A;  . SHOULDER ARTHROSCOPY WITH ROTATOR CUFF REPAIR AND SUBACROMIAL DECOMPRESSION Right 08/05/2017   Procedure: RIGHT SHOULDER ARTHROSCOPY WITH ROTATOR CUFF REPAIR, DISTAL CLAVICLE EXCISION, DEBRIDEMENT AND SUBACROMIAL DECOMPRESSION;  Surgeon: Leandrew Koyanagi, MD;  Location: [place];  Service: Orthopedics;  Laterality: Right;  . SPACE OAR INSTILLATION N/A 01/02/2018   Procedure: SPACE OAR INSTILLATION;  Surgeon: Cleon Gustin, MD;  Location: [facility];  Service: Urology;  Laterality: N/A;    There were no vitals filed for this visit.  Subjective Assessment - 07/03/19 1011    Subjective  Patient reports he is sore from walking a lot in his garden yesterday. The  exercises are going well. He notes a slight improvement, he is able to walk more but he is still hesitant to play tennis.    Patient is accompained by:  Interpreter   Lelan Pons [phone]   Currently in Pain?  Yes    Pain Score  1    pain worsens with activity   Pain Location  Back    Pain Orientation  Left;Lower    Pain Descriptors / Indicators  Tightness;Pressure;Aching    Pain Type  Chronic pain    Pain Onset  More than a month ago    Pain Frequency  Intermittent    Multiple Pain Sites  No                       [facility] Adult PT Treatment/Exercise - 07/03/19 0001      Exercises   Exercises  Lumbar;Knee/Hip      Lumbar Exercises: Stretches   Passive Hamstring Stretch  Right;Left;2 reps;30 seconds    Single Knee to Chest Stretch  Right;Left;2 reps;30 seconds    Piriformis Stretch  Right;Left;2 reps;30 seconds    Piriformis Stretch Limitations  Supine    Other Lumbar Stretch Exercise  Side lying thoracic rotation open book stretch 5x15 sec bilat      Lumbar Exercises: Seated   Sit to Stand  10 reps   2 sets   Other Seated  Lumbar Exercises  Trunk rotation with yellow band 2x10 each      Lumbar Exercises: Supine   Bridge  10 reps;5 seconds    Other Supine Lumbar Exercises  Lower trunk rotation with feet elevated 2x5 each             PT Education - 07/03/19 1027    Education Details  HEP    Person(s) Educated  Patient    Methods  Explanation;Demonstration;Verbal cues;Handout    Comprehension  Verbalized understanding;Verbal cues required;Need further instruction       PT Short Term Goals - 05/15/19 0953      PT SHORT TERM GOAL #1   Title  Patient will demosntrate full passive left shoulder flexion without pain     Baseline  improved but still limited    Time  3    Period  Weeks    Status  On-going      PT SHORT TERM GOAL #2   Title  Patient will demostrate 5/5 gross left shoulder strength     Baseline  improving but still limoted in flexion and ER     Time  3    Period  Weeks    Status  On-going      PT SHORT TERM GOAL #3   Title  Patient will improve active left shoulder flexion and abduction     Baseline  improved significantly since eval but still limited    Time  4    Status  On-going    Target Date  04/25/19      PT SHORT TERM GOAL #4   Title  Patient will demostrate 3/5 right shoulder flexion strength below 90 degrees     Baseline  4/5 to 120 dgrees     Time  4    Period  Weeks    Status  On-going        PT Long Term Goals - 06/21/19 1631      PT LONG TERM GOAL #1   Title  Patient will sleep through the night without back pain (target for all LTG 08/02/19)    Time  6    Period  Weeks    Status  New      PT LONG TERM GOAL #2   Title  Patient will return to tennis with only mild back pain less than 3/10.    Time  6    Period  Weeks    Status  New      PT LONG TERM GOAL #3   Title  Pt will be able to stand >1 hour with only mild pain 3/10    Time  6    Period  Weeks    Status  New            Plan - 07/03/19 1029    Clinical Impression Statement  Patient tolerated therapy well and is progressing well with his strengthening. He seems to improving in regard to his pain with activity, but continues to report increased lower left sided back pain with activity. He would benefit from continued skilled PT to progress his motion and strength in order to improve tolerance to standing activities.    PT Treatment/Interventions  ADLs/Self Care Home Management;Cryotherapy;Dentist;Therapeutic activities;Therapeutic exercise;Neuromuscular re-education;Patient/family education;Manual techniques;Passive range of motion;Dry needling;Taping;Aquatic Therapy;Iontophoresis 4mg /ml Dexamethasone;Spinal Manipulations;Joint Manipulations    PT Next Visit Plan  Lumbar and hip motion and stretching, progress hip and core strengthening    PT Home Exercise Plan  Supine pirformis  stretch, supine hamstring stretch with strap, supine lower trunk rotations with feet elevated, bridge, dead bug, clamshell, sit-to-stand, side lying open book, seated banded trunk rotation with yellow band    Consulted and Agree with Plan of Care  Patient       Patient will benefit from skilled therapeutic intervention in order to improve the following deficits and impairments:  Pain, Decreased activity tolerance, Decreased endurance, Decreased range of motion, Decreased strength, Impaired UE functional use, Increased muscle spasms  Visit Diagnosis: Chronic bilateral low back pain without sciatica     Problem List Patient Active Problem List   Diagnosis Date Noted  . Chronic left shoulder pain 09/05/2018  . Hyperlipidemia 08/15/2018  . Obesity (BMI 30-39.9) 08/15/2018  . Skin tag 08/15/2018  . Malignant neoplasm of prostate ([place]) 10/13/2017  . Rotator cuff tear arthropathy of right shoulder 08/31/2017  . Personal history of gastric ulcer 02/09/2017    Hilda Blades, PT, DPT, LAT, ATC 07/03/19  11:47 AM Phone: [phone] Fax: [facility] [address] [place], [place], [address] Phone: [phone]   Fax:  [phone]  Name: Roy Reed MRN: [id number] Date of Birth: November 23, 1955

## 2019-07-03 NOTE — Patient Instructions (Signed)
Access Code: [email or web address]  URL: [email or web address]  Date: 07/03/2019  Prepared by: Hilda Blades   Exercises Seated Trunk Rotation with Anchored Resistance - 10 reps - 2 sets

## 2019-07-05 ENCOUNTER — Ambulatory Visit: Payer: Self-pay | Admitting: Physical Therapy

## 2019-07-10 ENCOUNTER — Other Ambulatory Visit: Payer: Self-pay

## 2019-07-10 ENCOUNTER — Ambulatory Visit: Payer: Self-pay | Attending: Orthopaedic Surgery | Admitting: Physical Therapy

## 2019-07-10 ENCOUNTER — Encounter: Payer: Self-pay | Admitting: Physical Therapy

## 2019-07-10 DIAGNOSIS — G8929 Other chronic pain: Secondary | ICD-10-CM | POA: Insufficient documentation

## 2019-07-10 DIAGNOSIS — M545 Low back pain, unspecified: Secondary | ICD-10-CM

## 2019-07-10 NOTE — Therapy (Signed)
[place], [place], [address] Phone: [phone]   Fax:  [phone]  Physical Therapy Treatment  Patient Details  Name: Roy Reed MRN: [id number] Date of Birth: [birth year] Referring Provider (PT): Leandrew Koyanagi, MD   Encounter Date: 07/10/2019  PT End of Session - 07/10/19 1313    Visit Number  5    Number of Visits  12    Date for PT Re-Evaluation  08/02/19    Authorization Type  CAFA  until 09/10/18    PT Start Time  1002    PT Stop Time  1040    PT Time Calculation (min)  38 min    Activity Tolerance  Patient tolerated treatment well    Behavior During Therapy  [facility] for tasks assessed/performed       Past Medical History:  Diagnosis Date  . Elevated PSA   . Left knee pain   . Low back pain   . Pre-diabetes   . Prostate cancer ([place])   . Rotator cuff disorder, right     Past Surgical History:  Procedure Laterality Date  . COLONOSCOPY    . PROSTATE BIOPSY    . PROSTATE BIOPSY    . RADIOACTIVE SEED IMPLANT N/A 01/02/2018   Procedure: RADIOACTIVE SEED IMPLANT/BRACHYTHERAPY IMPLANT;  Surgeon: Cleon Gustin, MD;  Location: [facility];  Service: Urology;  Laterality: N/A;  . SHOULDER ARTHROSCOPY WITH ROTATOR CUFF REPAIR AND SUBACROMIAL DECOMPRESSION Right 08/05/2017   Procedure: RIGHT SHOULDER ARTHROSCOPY WITH ROTATOR CUFF REPAIR, DISTAL CLAVICLE EXCISION, DEBRIDEMENT AND SUBACROMIAL DECOMPRESSION;  Surgeon: Leandrew Koyanagi, MD;  Location: [place];  Service: Orthopedics;  Laterality: Right;  . SPACE OAR INSTILLATION N/A 01/02/2018   Procedure: SPACE OAR INSTILLATION;  Surgeon: Cleon Gustin, MD;  Location: [facility];  Service: Urology;  Laterality: N/A;    There were no vitals filed for this visit.  Subjective Assessment - 07/10/19 1009    Subjective  Patient reports he is doing well. The exercises he was given last week with the  band are bothering his shoulder. His low back continues to improve.    Patient is accompained by:  Interpreter   [name] [phone]   Currently in Pain?  Yes    Pain Score  1     Pain Location  Back    Pain Orientation  Lower;Left    Pain Descriptors / Indicators  Tightness;Sore    Pain Type  Chronic pain    Pain Onset  More than a month ago    Pain Frequency  Intermittent         [facility] PT Assessment - 07/10/19 0001      AROM   AROM Assessment Site  Lumbar    Lumbar Flexion  90%    Lumbar Extension  75%    Lumbar - Right Side Bend  75%    Lumbar - Left Side Bend  75%    Lumbar - Right Rotation  75%    Lumbar - Left Rotation  75%      Strength   Strength Assessment Site  Hip    Right/Left Hip  Right;Left    Right Hip Extension  4/5    Right Hip ABduction  4-/5    Left Hip Extension  4/5    Left Hip ABduction  4-/5                   [facility] Adult PT  Treatment/Exercise - 07/10/19 0001      Exercises   Exercises  Lumbar;Knee/Hip      Lumbar Exercises: Stretches   Passive Hamstring Stretch  Right;Left;2 reps;30 seconds    Single Knee to Chest Stretch  Right;Left;2 reps;30 seconds    Piriformis Stretch  Right;Left;2 reps;30 seconds    Piriformis Stretch Limitations  Supine    Gastroc Stretch  2 reps;30 seconds    Gastroc Stretch Limitations  Standing    Other Lumbar Stretch Exercise  Side lying thoracic rotation open book stretch 5x15 sec bilat      Lumbar Exercises: Seated   Sit to Stand  10 reps   2 sets   Other Seated Lumbar Exercises  Trunk rotation with arms together at 90 deg 2x10 each      Lumbar Exercises: Supine   Dead Bug  5 reps   2 sets   Dead Bug Limitations  legs only    Bridge  10 reps;5 seconds    Other Supine Lumbar Exercises  Lower trunk rotation with feet elevated 2x5 each             PT Education - 07/10/19 1312    Education Details  HEP    Person(s) Educated  Patient    Methods  Explanation;Demonstration;Verbal cues;Handout     Comprehension  Verbalized understanding;Verbal cues required        PT Long Term Goals - 06/21/19 1631      PT LONG TERM GOAL #1   Title  Patient will sleep through the night without back pain (target for all LTG 08/02/19)    Time  6    Period  Weeks    Status  New      PT LONG TERM GOAL #2   Title  Patient will return to tennis with only mild back pain less than 3/10.    Time  6    Period  Weeks    Status  New      PT LONG TERM GOAL #3   Title  Pt will be able to stand >1 hour with only mild pain 3/10    Time  6    Period  Weeks    Status  New            Plan - 07/10/19 1313    Clinical Impression Statement  Patient continues to repor improvement in lower back symptoms. He tolerated therapy well this visit. The seated rotation exercise with a band was modified to not use a band this visit as it caused left shoulder pain. He was also given a calf stretch as he reported onset of calf cramping over the past few days. He would benefit from continued skilled PT to progress his motion and strength in order to improve tolerance to standing activities.    PT Treatment/Interventions  ADLs/Self Care Home Management;Cryotherapy;Dentist;Therapeutic activities;Therapeutic exercise;Neuromuscular re-education;Patient/family education;Manual techniques;Passive range of motion;Dry needling;Taping;Aquatic Therapy;Iontophoresis 4mg /ml Dexamethasone;Spinal Manipulations;Joint Manipulations    PT Next Visit Plan  Lumbar and hip motion and stretching, progress hip and core strengthening    PT Home Exercise Plan  Supine pirformis stretch, supine hamstring stretch with strap, supine lower trunk rotations with feet elevated, bridge, dead bug, clamshell, sit-to-stand, side lying open book stretch, seated trunk rotation with arms extended, standing gastroc stetch    Consulted and Agree with Plan of Care  Patient       Patient will benefit from  skilled therapeutic intervention in order to improve  the following deficits and impairments:  Pain, Decreased activity tolerance, Decreased endurance, Decreased range of motion, Decreased strength, Impaired UE functional use, Increased muscle spasms  Visit Diagnosis: Chronic bilateral low back pain without sciatica     Problem List Patient Active Problem List   Diagnosis Date Noted  . Chronic left shoulder pain 09/05/2018  . Hyperlipidemia 08/15/2018  . Obesity (BMI 30-39.9) 08/15/2018  . Skin tag 08/15/2018  . Malignant neoplasm of prostate ([place]) 10/13/2017  . Rotator cuff tear arthropathy of right shoulder 08/31/2017  . Personal history of gastric ulcer 02/09/2017    Hilda Blades, PT, DPT, LAT, ATC 07/10/19  1:18 PM Phone: [phone] Fax: [facility] [address] [place], [place], [address] Phone: [phone]   Fax:  [phone]  Name: Roy Reed MRN: [id number] Date of Birth: 1956/04/24

## 2019-07-11 MED FILL — TAMSULOSIN HCL 0.4 MG CAP: 0.4 | 30 days supply | Qty: 30 | Fill #9

## 2019-07-12 ENCOUNTER — Other Ambulatory Visit: Payer: Self-pay

## 2019-07-12 ENCOUNTER — Encounter: Payer: Self-pay | Admitting: Physical Therapy

## 2019-07-12 ENCOUNTER — Ambulatory Visit: Payer: Self-pay | Admitting: Physical Therapy

## 2019-07-12 DIAGNOSIS — G8929 Other chronic pain: Secondary | ICD-10-CM

## 2019-07-12 NOTE — Therapy (Signed)
[place], [place], [address] Phone: [phone]   Fax:  [phone]  Physical Therapy Treatment  Patient Details  Name: Roy Reed MRN: [id number] Date of Birth: 1956/04/19 Referring Provider (PT): Leandrew Koyanagi, MD   Encounter Date: 07/12/2019  PT End of Session - 07/12/19 1012    Visit Number  6    Number of Visits  12    Date for PT Re-Evaluation  08/02/19    Authorization Type  CAFA  until 09/10/18    PT Start Time  1001    PT Stop Time  1045    PT Time Calculation (min)  44 min    Activity Tolerance  Patient tolerated treatment well    Behavior During Therapy  [facility] for tasks assessed/performed       Past Medical History:  Diagnosis Date  . Elevated PSA   . Left knee pain   . Low back pain   . Pre-diabetes   . Prostate cancer ([place])   . Rotator cuff disorder, right     Past Surgical History:  Procedure Laterality Date  . COLONOSCOPY    . PROSTATE BIOPSY    . PROSTATE BIOPSY    . RADIOACTIVE SEED IMPLANT N/A 01/02/2018   Procedure: RADIOACTIVE SEED IMPLANT/BRACHYTHERAPY IMPLANT;  Surgeon: Cleon Gustin, MD;  Location: [facility];  Service: Urology;  Laterality: N/A;  . SHOULDER ARTHROSCOPY WITH ROTATOR CUFF REPAIR AND SUBACROMIAL DECOMPRESSION Right 08/05/2017   Procedure: RIGHT SHOULDER ARTHROSCOPY WITH ROTATOR CUFF REPAIR, DISTAL CLAVICLE EXCISION, DEBRIDEMENT AND SUBACROMIAL DECOMPRESSION;  Surgeon: Leandrew Koyanagi, MD;  Location: [place];  Service: Orthopedics;  Laterality: Right;  . SPACE OAR INSTILLATION N/A 01/02/2018   Procedure: SPACE OAR INSTILLATION;  Surgeon: Cleon Gustin, MD;  Location: [facility];  Service: Urology;  Laterality: N/A;    There were no vitals filed for this visit.  Subjective Assessment - 07/12/19 1008    Subjective  Patient reports he is doing better. He reports no pain when he sits unless it has  been for a long time, but he has pain when he stands still. He does feel better when he moves around while standing. He also reported that he has played tennis and he is able to play for about 20 minutes before needing a break.    Currently in Pain?  Yes    Pain Score  2     Pain Location  Back    Pain Orientation  Left;Lower    Pain Descriptors / Indicators  Tightness;Sore    Pain Type  Chronic pain    Pain Onset  More than a month ago    Pain Frequency  Intermittent    Aggravating Factors   standing    Pain Relieving Factors  sitting         [facility] PT Assessment - 07/12/19 0001      ROM / Strength   AROM / PROM / Strength  AROM;Strength      AROM   AROM Assessment Site  Lumbar    Lumbar Flexion  [facility]      Strength   Strength Assessment Site  Hip    Right/Left Hip  Right;Left    Right Hip ABduction  4-/5    Left Hip ABduction  4-/5                   [facility] Adult PT Treatment/Exercise - 07/12/19 0001  Exercises   Exercises  Lumbar;Knee/Hip      Lumbar Exercises: Stretches   Passive Hamstring Stretch  30 seconds    Passive Hamstring Stretch Limitations  Supine with strap    Single Knee to Chest Stretch  30 seconds    Piriformis Stretch  30 seconds    Piriformis Stretch Limitations  Supine    Other Lumbar Stretch Exercise  Side lying thoracic rotation open book stretch 5x15 sec bilat    Other Lumbar Stretch Exercise  Standing forward bend hamstring/lumbar stretch 5x10 sec      Lumbar Exercises: Aerobic   Nustep  L6 x 5 min LE/UE      Lumbar Exercises: Standing   Lifting  10 reps;Weights   3 sets from 8"   Lifting Weights (lbs)  30 lbs    Lifting Limitations  Patient required constant cueing for proper hip hinge technique      Lumbar Exercises: Seated   Sit to Stand  10 reps   2 sets   Sit to Stand Limitations  No use of UEs    Other Seated Lumbar Exercises  Seated trunk rotation with elbows by side 2x10 each with red band    Other Seated Lumbar  Exercises  Elbows by side and emphasis on trunk rotation to avoid shoulder aggravation      Lumbar Exercises: Supine   Bridge  10 reps;5 seconds             PT Education - 07/12/19 1011    Education Details  HEP    Person(s) Educated  Patient    Methods  Explanation;Demonstration;Verbal cues    Comprehension  Verbalized understanding;Verbal cues required;Need further instruction       PT Long Term Goals - 06/21/19 1631      PT LONG TERM GOAL #1   Title  Patient will sleep through the night without back pain (target for all LTG 08/02/19)    Time  6    Period  Weeks    Status  New      PT LONG TERM GOAL #2   Title  Patient will return to tennis with only mild back pain less than 3/10.    Time  6    Period  Weeks    Status  New      PT LONG TERM GOAL #3   Title  Pt will be able to stand >1 hour with only mild pain 3/10    Time  6    Period  Weeks    Status  New            Plan - 07/12/19 1336    Clinical Impression Statement  Patient is doing well and tolerated the progression in strengthening well this visit. He seems to be moving much better but continues to report right sided low back pain when standing. He was encouraged to continue strengthening and stretching at home and while standing to improve tolerance to activity. He would benefit from continued skilled PT to progress his mobility and strength to reduce pain with standing activities.    PT Treatment/Interventions  ADLs/Self Care Home Management;Cryotherapy;Dentist;Therapeutic activities;Therapeutic exercise;Neuromuscular re-education;Patient/family education;Manual techniques;Passive range of motion;Dry needling;Taping;Aquatic Therapy;Iontophoresis 4mg /ml Dexamethasone;Spinal Manipulations;Joint Manipulations    PT Next Visit Plan  Lumbar and hip motion and stretching, progress hip and core strengthening    PT Home Exercise Plan  Supine pirformis  stretch, supine hamstring stretch with strap, supine lower trunk rotations with feet elevated, bridge,  dead bug, clamshell, sit-to-stand, side lying open book stretch, seated trunk rotation with red band elbows by side, standing gastroc stetch, standing forward bend    Consulted and Agree with Plan of Care  Patient       Patient will benefit from skilled therapeutic intervention in order to improve the following deficits and impairments:  Pain, Decreased activity tolerance, Decreased endurance, Decreased range of motion, Decreased strength, Impaired UE functional use, Increased muscle spasms  Visit Diagnosis: Chronic bilateral low back pain without sciatica     Problem List Patient Active Problem List   Diagnosis Date Noted  . Chronic left shoulder pain 09/05/2018  . Hyperlipidemia 08/15/2018  . Obesity (BMI 30-39.9) 08/15/2018  . Skin tag 08/15/2018  . Malignant neoplasm of prostate ([place]) 10/13/2017  . Rotator cuff tear arthropathy of right shoulder 08/31/2017  . Personal history of gastric ulcer 02/09/2017    Hilda Blades, PT, DPT, LAT, ATC 07/12/19  1:56 PM Phone: [phone] Fax: [facility] [address] [place], [place], [address] Phone: [phone]   Fax:  [phone]  Name: Roy Reed MRN: [id number] Date of Birth: 12/18/1955

## 2019-07-17 ENCOUNTER — Encounter: Payer: Self-pay | Admitting: Physical Therapy

## 2019-07-17 ENCOUNTER — Ambulatory Visit: Payer: Self-pay | Admitting: Physical Therapy

## 2019-07-17 ENCOUNTER — Other Ambulatory Visit: Payer: Self-pay

## 2019-07-17 DIAGNOSIS — G8929 Other chronic pain: Secondary | ICD-10-CM

## 2019-07-17 NOTE — Therapy (Signed)
[place], [place], [address] Phone: [phone]   Fax:  [phone]  Physical Therapy Treatment  Patient Details  Name: Verle Gelsinger MRN: [id number] Date of Birth: 10/09/1955 Referring Provider (PT): Leandrew Koyanagi, MD   Encounter Date: 07/17/2019  PT End of Session - 07/17/19 1006    Visit Number  7    Number of Visits  12    Date for PT Re-Evaluation  08/02/19    Authorization Type  CAFA  until 09/10/18    PT Start Time  1006    PT Stop Time  1045    PT Time Calculation (min)  39 min    Activity Tolerance  Patient tolerated treatment well    Behavior During Therapy  [facility] for tasks assessed/performed       Past Medical History:  Diagnosis Date  . Elevated PSA   . Left knee pain   . Low back pain   . Pre-diabetes   . Prostate cancer ([place])   . Rotator cuff disorder, right     Past Surgical History:  Procedure Laterality Date  . COLONOSCOPY    . PROSTATE BIOPSY    . PROSTATE BIOPSY    . RADIOACTIVE SEED IMPLANT N/A 01/02/2018   Procedure: RADIOACTIVE SEED IMPLANT/BRACHYTHERAPY IMPLANT;  Surgeon: Cleon Gustin, MD;  Location: [facility];  Service: Urology;  Laterality: N/A;  . SHOULDER ARTHROSCOPY WITH ROTATOR CUFF REPAIR AND SUBACROMIAL DECOMPRESSION Right 08/05/2017   Procedure: RIGHT SHOULDER ARTHROSCOPY WITH ROTATOR CUFF REPAIR, DISTAL CLAVICLE EXCISION, DEBRIDEMENT AND SUBACROMIAL DECOMPRESSION;  Surgeon: Leandrew Koyanagi, MD;  Location: [place];  Service: Orthopedics;  Laterality: Right;  . SPACE OAR INSTILLATION N/A 01/02/2018   Procedure: SPACE OAR INSTILLATION;  Surgeon: Cleon Gustin, MD;  Location: [facility];  Service: Urology;  Laterality: N/A;    There were no vitals filed for this visit.  Subjective Assessment - 07/17/19 1005    Subjective  Patient reports continued improvement but has left sided low back pain when  standing for extended periods and when playing tennis.    Currently in Pain?  Yes    Pain Score  1     Pain Location  Back    Pain Orientation  Left;Lower    Pain Descriptors / Indicators  Tightness;Sore    Pain Type  Chronic pain    Pain Onset  More than a month ago    Pain Frequency  Intermittent         [facility] PT Assessment - 07/17/19 0001      AROM   AROM Assessment Site  Lumbar    Lumbar Flexion  WFL    Lumbar Extension  75%    Lumbar - Right Side Bend  75%    Lumbar - Left Side Bend  75%    Lumbar - Right Rotation  75%    Lumbar - Left Rotation  75%      Strength   Strength Assessment Site  Hip    Right/Left Hip  Right;Left    Right Hip Flexion  4/5    Right Hip Extension  4/5    Right Hip ABduction  4-/5    Left Hip Flexion  4/5    Left Hip Extension  4/5    Left Hip ABduction  4-/5                   [facility] Adult PT Treatment/Exercise -  07/17/19 0001      Exercises   Exercises  Lumbar;Knee/Hip      Lumbar Exercises: Stretches   Other Lumbar Stretch Exercise  Standing forward bend hamstring/lumbar stretch x10 with 5 sec hold      Lumbar Exercises: Aerobic   Nustep  L6 x 5 min LE/UE      Lumbar Exercises: Standing   Lifting  10 reps;Weights   3 sets from 8" box   Lifting Weights (lbs)  25 lbs    Lifting Limitations  Verbal and tactile cueing for hip hinge technqiue and maintaining neutral spine     Other Standing Lumbar Exercises  Standing trunk rotation with elbows by side x10 each with red band   Elbows by side and emphasis on trunk rotation     Lumbar Exercises: Seated   Other Seated Lumbar Exercises  --    Other Seated Lumbar Exercises  --      Lumbar Exercises: Supine   Bridge  10 reps;5 seconds    Bridge Limitations  use of wedge under feet to avoid feet from sliding    Bridge with March  10 reps    Other Supine Lumbar Exercises  Lower trunk rotation x10 5 sec hold      Lumbar Exercises: Sidelying   Clam  15 reps   2 sets   Clam  Limitations  red band             PT Education - 07/17/19 1006    Education Details  HEP    Person(s) Educated  Patient    Methods  Explanation;Demonstration;Verbal cues    Comprehension  Verbalized understanding;Verbal cues required;Need further instruction        PT Long Term Goals - 07/17/19 1014      PT LONG TERM GOAL #1   Title  Patient will sleep through the night without back pain (target for all LTG 08/02/19)    Time  6    Period  Weeks    Status  New      PT LONG TERM GOAL #2   Title  Patient will return to tennis with only mild back pain less than 3/10.    Time  6    Period  Weeks    Status  On-going      PT LONG TERM GOAL #3   Title  Pt will be able to stand >1 hour with only mild pain 3/10    Time  6    Period  Weeks    Status  On-going            Plan - 07/17/19 1010    Clinical Impression Statement  Patient tolerated therapy well with no adverse effects. He is progressing well with his strengthening exercises and tolerated more standing exercises this visit with less pain. He continues to exhibit limited mobility and strength that is most likely leading to his left sided low back pain, and he was encouraged continue working on his stretches and strengthening at home. He would benefit from continued skilled PT to progress his mobility and strength to reduce pain with standing activities and return to tennis.    PT Treatment/Interventions  ADLs/Self Care Home Management;Cryotherapy;Dentist;Therapeutic activities;Therapeutic exercise;Neuromuscular re-education;Patient/family education;Manual techniques;Passive range of motion;Dry needling;Taping;Aquatic Therapy;Iontophoresis 4mg /ml Dexamethasone;Spinal Manipulations;Joint Manipulations    PT Next Visit Plan  Lumbar and hip mobility/stretching, progress hip and core strengthening    PT Home Exercise Plan  Supine pirformis stretch, supine hamstring  stretch with strap, supine lower trunk rotations with feet elevated, marching bridge, dead bug, clamshell, sit-to-stand, side lying open book stretch, standing trunk rotation with red band elbows by side, standing forward bend    Consulted and Agree with Plan of Care  Patient       Patient will benefit from skilled therapeutic intervention in order to improve the following deficits and impairments:  Pain, Decreased activity tolerance, Decreased endurance, Decreased range of motion, Decreased strength, Impaired UE functional use, Increased muscle spasms  Visit Diagnosis: Chronic bilateral low back pain without sciatica     Problem List Patient Active Problem List   Diagnosis Date Noted  . Chronic left shoulder pain 09/05/2018  . Hyperlipidemia 08/15/2018  . Obesity (BMI 30-39.9) 08/15/2018  . Skin tag 08/15/2018  . Malignant neoplasm of prostate ([place]) 10/13/2017  . Rotator cuff tear arthropathy of right shoulder 08/31/2017  . Personal history of gastric ulcer 02/09/2017    Hilda Blades, PT, DPT, LAT, ATC 07/17/19  12:06 PM Phone: [phone] Fax: [facility] [address] [place], [place], [address] Phone: [phone]   Fax:  [phone]  Name: Nickolus Daker MRN: [id number] Date of Birth: 1956/02/13

## 2019-07-19 ENCOUNTER — Ambulatory Visit: Payer: Self-pay | Attending: Internal Medicine | Admitting: Internal Medicine

## 2019-07-19 ENCOUNTER — Encounter: Payer: Self-pay | Admitting: Internal Medicine

## 2019-07-19 ENCOUNTER — Encounter: Payer: Self-pay | Admitting: Physical Therapy

## 2019-07-19 ENCOUNTER — Ambulatory Visit: Payer: Self-pay | Admitting: Physical Therapy

## 2019-07-19 ENCOUNTER — Other Ambulatory Visit: Payer: Self-pay

## 2019-07-19 DIAGNOSIS — E785 Hyperlipidemia, unspecified: Secondary | ICD-10-CM

## 2019-07-19 DIAGNOSIS — C61 Malignant neoplasm of prostate: Secondary | ICD-10-CM

## 2019-07-19 DIAGNOSIS — E669 Obesity, unspecified: Secondary | ICD-10-CM

## 2019-07-19 DIAGNOSIS — G8929 Other chronic pain: Secondary | ICD-10-CM

## 2019-07-19 DIAGNOSIS — Z23 Encounter for immunization: Secondary | ICD-10-CM

## 2019-07-19 NOTE — Therapy (Signed)
[place], [place], [address] Phone: [phone]   Fax:  [phone]  Physical Therapy Treatment  Patient Details  Name: Roy Reed MRN: [id number] Date of Birth: 05/06/1956 Referring Provider (PT): Leandrew Koyanagi, MD   Encounter Date: 07/19/2019  PT End of Session - 07/19/19 1115    Visit Number  8    Number of Visits  12    Date for PT Re-Evaluation  08/02/19    Authorization Type  CAFA  until 09/10/18    PT Start Time  1107   Patient 7 minutes late   PT Stop Time  1145    PT Time Calculation (min)  38 min    Activity Tolerance  Patient tolerated treatment well    Behavior During Therapy  [facility] for tasks assessed/performed       Past Medical History:  Diagnosis Date  . Elevated PSA   . Left knee pain   . Low back pain   . Pre-diabetes   . Prostate cancer ([place])   . Rotator cuff disorder, right     Past Surgical History:  Procedure Laterality Date  . COLONOSCOPY    . PROSTATE BIOPSY    . PROSTATE BIOPSY    . RADIOACTIVE SEED IMPLANT N/A 01/02/2018   Procedure: RADIOACTIVE SEED IMPLANT/BRACHYTHERAPY IMPLANT;  Surgeon: Cleon Gustin, MD;  Location: [facility];  Service: Urology;  Laterality: N/A;  . SHOULDER ARTHROSCOPY WITH ROTATOR CUFF REPAIR AND SUBACROMIAL DECOMPRESSION Right 08/05/2017   Procedure: RIGHT SHOULDER ARTHROSCOPY WITH ROTATOR CUFF REPAIR, DISTAL CLAVICLE EXCISION, DEBRIDEMENT AND SUBACROMIAL DECOMPRESSION;  Surgeon: Leandrew Koyanagi, MD;  Location: [place];  Service: Orthopedics;  Laterality: Right;  . SPACE OAR INSTILLATION N/A 01/02/2018   Procedure: SPACE OAR INSTILLATION;  Surgeon: Cleon Gustin, MD;  Location[facility];  Service: Urology;  Laterality: N/A;    There were no vitals filed for this visit.  Subjective Assessment - 07/19/19 1114    Subjective  Patient reports his back has been sore when he stands for  a long period of time but overall it feels a little better.    Patient is accompained by:  Interpreter   Andree Moro [phone]   Pertinent History  was seen for recent PT with shoulder pain    Limitations  Lifting;Other (comment)    How long can you stand comfortably?  10 min    How long can you walk comfortably?  10-20  min    Diagnostic tests  lumbar XR Mild degenerative changes L4-5 and L5-S1    Patient Stated Goals  be able to play tennis and decrease pain    Currently in Pain?  No/denies                       [facility] Adult PT Treatment/Exercise - 07/19/19 0001      Lumbar Exercises: Stretches   Passive Hamstring Stretch  30 seconds    Passive Hamstring Stretch Limitations  Supine with strap    Piriformis Stretch  30 seconds    Piriformis Stretch Limitations  Supine      Lumbar Exercises: Aerobic   Nustep  L6 x 5 min LE/UE      Lumbar Exercises: Standing   Lifting  10 reps;Weights   3 sets from 8" box   Lifting Weights (lbs)  25 lbs    Lifting Limitations  Verbal and tactile cueing for hip hinge technqiue  and maintaining neutral spine       Lumbar Exercises: Supine   Bridge  10 reps;5 seconds    Bridge Limitations  use of wedge under feet to avoid feet from sliding    Bridge with March  10 reps    Other Supine Lumbar Exercises  Lower trunk rotation x10 5 sec hold             PT Education - 07/19/19 1114    Education Details  reviewed lifting technique    Person(s) Educated  Patient    Methods  Explanation;Demonstration;Tactile cues;Verbal cues    Comprehension  Verbalized understanding;Verbal cues required;Returned demonstration;Tactile cues required       PT Short Term Goals - 05/15/19 0953      PT SHORT TERM GOAL #1   Title  Patient will demosntrate full passive left shoulder flexion without pain     Baseline  improved but still limited    Time  3    Period  Weeks    Status  On-going      PT SHORT TERM GOAL #2   Title  Patient will demostrate  5/5 gross left shoulder strength     Baseline  improving but still limoted in flexion and ER    Time  3    Period  Weeks    Status  On-going      PT SHORT TERM GOAL #3   Title  Patient will improve active left shoulder flexion and abduction     Baseline  improved significantly since eval but still limited    Time  4    Status  On-going    Target Date  04/25/19      PT SHORT TERM GOAL #4   Title  Patient will demostrate 3/5 right shoulder flexion strength below 90 degrees     Baseline  4/5 to 120 dgrees     Time  4    Period  Weeks    Status  On-going        PT Long Term Goals - 07/17/19 1014      PT LONG TERM GOAL #1   Title  Patient will sleep through the night without back pain (target for all LTG 08/02/19)    Time  6    Period  Weeks    Status  New      PT LONG TERM GOAL #2   Title  Patient will return to tennis with only mild back pain less than 3/10.    Time  6    Period  Weeks    Status  On-going      PT LONG TERM GOAL #3   Title  Pt will be able to stand >1 hour with only mild pain 3/10    Time  6    Period  Weeks    Status  On-going            Plan - 07/19/19 1245    Clinical Impression Statement  Patient tolerated treatment well. Therapy gave him a tennis ball for self soft tissue mobilization. He was encouraged to continue wto work on his strengthening his pain increases with time. Therapy explained to him that he need to get his muscles stronger to improve his endurance with standing he understands. Therapy will continue to progress as tolerance.    Personal Factors and Comorbidities  Comorbidity 2    Comorbidities  low back pain. Right shoulder RTC repai 2019.     Examination-Participation Restrictions  Cleaning;Community Activity;Yard Work    Stability/Clinical Decision Making  Stable/Uncomplicated    Clinical Decision Making  Low    Rehab Potential  Good    PT Frequency  2x / week    PT Duration  6 weeks    PT Treatment/Interventions   ADLs/Self Care Home Management;Cryotherapy;Dentist;Therapeutic activities;Therapeutic exercise;Neuromuscular re-education;Patient/family education;Manual techniques;Passive range of motion;Dry needling;Taping;Aquatic Therapy;Iontophoresis 4mg /ml Dexamethasone;Spinal Manipulations;Joint Manipulations    PT Next Visit Plan  Lumbar and hip mobility/stretching, progress hip and core strengthening    PT Home Exercise Plan  Supine pirformis stretch, supine hamstring stretch with strap, supine lower trunk rotations with feet elevated, marching bridge, dead bug, clamshell, sit-to-stand, side lying open book stretch, standing trunk rotation with red band elbows by side, standing forward bend    Consulted and Agree with Plan of Care  Patient       Patient will benefit from skilled therapeutic intervention in order to improve the following deficits and impairments:  Pain, Decreased activity tolerance, Decreased endurance, Decreased range of motion, Decreased strength, Impaired UE functional use, Increased muscle spasms  Visit Diagnosis: Chronic bilateral low back pain without sciatica     Problem List Patient Active Problem List   Diagnosis Date Noted  . Chronic left shoulder pain 09/05/2018  . Hyperlipidemia 08/15/2018  . Obesity (BMI 30-39.9) 08/15/2018  . Skin tag 08/15/2018  . Malignant neoplasm of prostate ([place]) 10/13/2017  . Rotator cuff tear arthropathy of right shoulder 08/31/2017  . Personal history of gastric ulcer 02/09/2017    Carney Living PT DPT  07/19/2019, 1:06 PM  [facility] [address] [place], [place], [address] Phone: [phone]   Fax:  [phone]  Name: Roy Reed MRN: [id number] Date of Birth: [birth year]

## 2019-07-19 NOTE — Progress Notes (Signed)
Virtual Visit via Telephone Note Due to current restrictions/limitations of in-office visits due to the COVID-19 pandemic, this scheduled clinical appointment was converted to a telehealth visit  I connected with Roy Reed on 07/19/19 at 10:13 a.m by telephone and verified that I am speaking with the correct person using two identifiers. I am in my office.  The patient is at home.  The patient, myself and [facility] [place] [phone] participated in this encounter.  I discussed the limitations, risks, security and privacy concerns of performing an evaluation and management service by telephone and the availability of in person appointments. I also discussed with the patient that there may be a patient responsible charge related to this service. The patient expressed understanding and agreed to proceed.   History of Present Illness: Pt with hx of pre-DM, HL, rotator cuff tear s/p RT shoulder arthroscopy andprostate CA (hormone and radiation seeds). Patient was last seen July of this year. Purpose of today's visit is chronic ds management  HL:  Taking and tolerating Lipitor.  Due for recheck lipid and CMP  C/o getting hot flashes which he attributes to hormone shots which he receives for prostate CA.   Recent PSA level was <0.01  Obesity/PreDM:  Reports weight has not changed from last visit.  He plays tennis a few days a wk.  "Not able to synchronize my breathing when exercising."  Wanting to know if there is anything he can do to improve that. No SOB at rest.  No CP - Portion sizes less and not snacking on junk foods.    HM:  Had flu shot.  Due for tdap and Prevnar 13. Reports having had a c-scope in [place] 5 yrs ago.  Reports study was nl but does not have a copy of report  Current Outpatient Medications on File Prior to Visit  Medication Sig Dispense Refill  . atorvastatin (LIPITOR) 10 MG tablet Take 1 tablet (10 mg total) by mouth daily. 30 tablet 5  . megestrol (MEGACE)  20 MG tablet Take 20 mg by mouth 2 (two) times daily as needed.  1  . methocarbamol (ROBAXIN) 500 MG tablet Take 1 tablet (500 mg total) by mouth 2 (two) times daily as needed. (Patient not taking: Reported on 07/19/2019) 20 tablet 0  . tamsulosin (FLOMAX) 0.4 MG CAPS capsule TAKE 1 CAPUSLE BY MOUTH TWICE DAILY WITH A MEAL 60 capsule 2   No current facility-administered medications on file prior to visit.       Observations/Objective:   Chemistry      Component Value Date/Time   NA 140 12/26/2017 0853   NA 139 11/03/2017 1654   K 4.9 12/26/2017 0853   CL 103 12/26/2017 0853   CO2 26 12/26/2017 0853   BUN 16 12/26/2017 0853   BUN 15 11/03/2017 1654   CREATININE 1.20 12/26/2017 0853      Component Value Date/Time   CALCIUM 9.8 12/26/2017 0853   ALKPHOS 106 12/13/2018 0922   AST 29 12/13/2018 0922   ALT 53 (H) 12/13/2018 0922   BILITOT 0.3 12/13/2018 0922     Lab Results  Component Value Date   WBC 5.2 12/26/2017   HGB 12.9 (L) 12/26/2017   HCT 39.5 12/26/2017   MCV 82.8 12/26/2017   PLT 226 12/26/2017   Lab Results  Component Value Date   PSA1 <0.1 12/13/2018   PSA1 50.4 (H) 02/28/2017     Assessment and Plan: 1. Hyperlipidemia, unspecified hyperlipidemia type Continue atorvastatin.  He will  come fasting to the lab for lipid profile check - Lipid panel; Future  2. Obesity (BMI 30-39.9) Dietary counseling given. Advised him to try warming up before he plays tennis and do some deep breathing exercises  - CBC; Future - Comprehensive metabolic panel; Future - Hemoglobin A1c; Future  3. Prostate cancer [facility]) Being treated by urology  4. Need for vaccination against Streptococcus pneumoniae using pneumococcal conjugate vaccine 13 Patient will come to see the clinical pharmacist to get his vaccine  5. Need for Tdap vaccination See #4 above   Follow Up Instructions: 4 mths   I discussed the assessment and treatment plan with the patient. The patient was  provided an opportunity to ask questions and all were answered. The patient agreed with the plan and demonstrated an understanding of the instructions.   The patient was advised to call back or seek an in-person evaluation if the symptoms worsen or if the condition fails to improve as anticipated.  I provided 22 minutes of non-face-to-face time during this encounter.   Karle Plumber, MD

## 2019-07-24 ENCOUNTER — Encounter: Payer: Self-pay | Admitting: Physical Therapy

## 2019-07-24 ENCOUNTER — Ambulatory Visit: Payer: Self-pay | Admitting: Physical Therapy

## 2019-07-24 ENCOUNTER — Other Ambulatory Visit: Payer: Self-pay

## 2019-07-24 DIAGNOSIS — M545 Low back pain, unspecified: Secondary | ICD-10-CM

## 2019-07-24 DIAGNOSIS — G8929 Other chronic pain: Secondary | ICD-10-CM

## 2019-07-24 NOTE — Therapy (Signed)
[place], [place], [address] Phone: [phone]   Fax:  [phone]  Physical Therapy Treatment  Patient Details  Name: Roy Reed MRN: [id number] Date of Birth: 08-25-1956 Referring Provider (PT): Leandrew Koyanagi, MD   Encounter Date: 07/24/2019  PT End of Session - 07/24/19 1005    Visit Number  9    Number of Visits  12    Date for PT Re-Evaluation  08/02/19    Authorization Type  CAFA  until 09/10/18    PT Start Time  0956    PT Stop Time  1038    PT Time Calculation (min)  42 min    Activity Tolerance  Patient tolerated treatment well    Behavior During Therapy  [facility] for tasks assessed/performed       Past Medical History:  Diagnosis Date  . Elevated PSA   . Left knee pain   . Low back pain   . Pre-diabetes   . Prostate cancer ([place])   . Rotator cuff disorder, right     Past Surgical History:  Procedure Laterality Date  . COLONOSCOPY    . PROSTATE BIOPSY    . PROSTATE BIOPSY    . RADIOACTIVE SEED IMPLANT N/A 01/02/2018   Procedure: RADIOACTIVE SEED IMPLANT/BRACHYTHERAPY IMPLANT;  Surgeon: Cleon Gustin, MD;  Location: [facility];  Service: Urology;  Laterality: N/A;  . SHOULDER ARTHROSCOPY WITH ROTATOR CUFF REPAIR AND SUBACROMIAL DECOMPRESSION Right 08/05/2017   Procedure: RIGHT SHOULDER ARTHROSCOPY WITH ROTATOR CUFF REPAIR, DISTAL CLAVICLE EXCISION, DEBRIDEMENT AND SUBACROMIAL DECOMPRESSION;  Surgeon: Leandrew Koyanagi, MD;  Location: [place];  Service: Orthopedics;  Laterality: Right;  . SPACE OAR INSTILLATION N/A 01/02/2018   Procedure: SPACE OAR INSTILLATION;  Surgeon: Cleon Gustin, MD;  Location: [facility];  Service: Urology;  Laterality: N/A;    There were no vitals filed for this visit.  Subjective Assessment - 07/24/19 1000    Subjective  Patient reports improvement, continues to have soreness with standing. He notes  that on Saturday he had a little bit of pain because he collected leaves. He denies any pain this vist.    Currently in Pain?  No/denies         [facility] PT Assessment - 07/24/19 0001      AROM   AROM Assessment Site  Lumbar      Strength   Right/Left Hip  Right;Left    Right Hip ABduction  4-/5    Left Hip ABduction  4-/5                   [facility] Adult PT Treatment/Exercise - 07/24/19 0001      Exercises   Exercises  Lumbar;Knee/Hip      Lumbar Exercises: Stretches   Other Lumbar Stretch Exercise  Standing forward bend hamstring/lumbar flexion stretch into lumbar extension x10    Other Lumbar Stretch Exercise  Standing side alternating lumbar side bending x10      Lumbar Exercises: Aerobic   Nustep  L6 x 8 min LE/UE      Lumbar Exercises: Standing   Lifting  10 reps;Weights   3 sets from 6" box   Lifting Weights (lbs)  30 lbs    Lifting Limitations  Verbal and tactile cueing for hip hinge technqiue and maintaining neutral spine       Lumbar Exercises: Supine   Bridge  10 reps;5 seconds    Bridge Limitations  use of wedge under feet to avoid feet from sliding    Other Supine Lumbar Exercises  Lower trunk rotation x10 5 sec hold      Lumbar Exercises: Sidelying   Clam  10 reps   2 sets   Clam Limitations  red band             PT Education - 07/24/19 1004    Education Details  HEP    Person(s) Educated  Patient    Methods  Explanation;Demonstration;Verbal cues    Comprehension  Verbalized understanding;Verbal cues required;Need further instruction;Returned demonstration          PT Long Term Goals - 07/24/19 1006      PT LONG TERM GOAL #1   Title  Patient will sleep through the night without back pain (target for all LTG 08/02/19)    Time  6    Period  Weeks    Status  Achieved      PT LONG TERM GOAL #2   Title  Patient will return to tennis with only mild back pain less than 3/10.    Time  6    Period  Weeks    Status  On-going       PT LONG TERM GOAL #3   Title  Pt will be able to stand >1 hour with only mild pain 3/10    Time  6    Period  Weeks    Status  On-going            Plan - 07/24/19 1005    Clinical Impression Statement  Patient is progressing well with his strengthening exercises and tolerated therapy well. He exhibits improved lifting mechanics and was able to increase weight with no increase in low back pain. He was encouraged to play tennis to test his low back and see how he feels, and he was instructed to take frequent short breaks to avoid onset of pain rather than waiting for pain to come on and then taking a break. No change made to HEP. He would benefit from continued skilled PT to progress his strength and allow for return to tennis.    PT Treatment/Interventions  ADLs/Self Care Home Management;Cryotherapy;Dentist;Therapeutic activities;Therapeutic exercise;Neuromuscular re-education;Patient/family education;Manual techniques;Passive range of motion;Dry needling;Taping;Aquatic Therapy;Iontophoresis 4mg /ml Dexamethasone;Spinal Manipulations;Joint Manipulations    PT Next Visit Plan  Lumbar and hip mobility/stretching, progress hip and core strengthening    PT Home Exercise Plan  Supine pirformis stretch, supine hamstring stretch with strap, supine lower trunk rotations with feet elevated, marching bridge, dead bug, clamshell, sit-to-stand, side lying open book stretch, standing trunk rotation with red band elbows by side, standing forward bend    Consulted and Agree with Plan of Care  Patient       Patient will benefit from skilled therapeutic intervention in order to improve the following deficits and impairments:  Pain, Decreased activity tolerance, Decreased endurance, Decreased range of motion, Decreased strength, Impaired UE functional use, Increased muscle spasms  Visit Diagnosis: Chronic bilateral low back pain without  sciatica     Problem List Patient Active Problem List   Diagnosis Date Noted  . Chronic left shoulder pain 09/05/2018  . Hyperlipidemia 08/15/2018  . Obesity (BMI 30-39.9) 08/15/2018  . Skin tag 08/15/2018  . Malignant neoplasm of prostate ([place]) 10/13/2017  . Rotator cuff tear arthropathy of right shoulder 08/31/2017  . Personal history of gastric ulcer 02/09/2017    Hilda Blades, PT, DPT, LAT, ATC 07/24/19  10:47 AM Phone: [phone] Fax: [facility] [address] [place], [place], [address] Phone: [phone]   Fax:  [phone]  Name: Roy Reed MRN: [id number] Date of Birth: [birth year]

## 2019-07-27 MED FILL — ?ATORVASTATIN 10 MG TABLET: 10 | 30 days supply | Qty: 30 | Fill #1

## 2019-07-31 ENCOUNTER — Encounter: Payer: Self-pay | Admitting: Physical Therapy

## 2019-07-31 ENCOUNTER — Telehealth: Payer: Self-pay

## 2019-07-31 ENCOUNTER — Other Ambulatory Visit: Payer: Self-pay

## 2019-07-31 ENCOUNTER — Ambulatory Visit: Payer: Self-pay | Admitting: Physical Therapy

## 2019-07-31 DIAGNOSIS — M545 Low back pain, unspecified: Secondary | ICD-10-CM

## 2019-07-31 DIAGNOSIS — G8929 Other chronic pain: Secondary | ICD-10-CM

## 2019-07-31 NOTE — Therapy (Signed)
[place], [place], [address] Phone: [phone]   Fax:  [phone]  Physical Therapy Treatment  Patient Details  Name: Roy Reed MRN: [id number] Date of Birth: October 20, 1955 Referring Provider (PT): Leandrew Koyanagi, MD   Encounter Date: 07/31/2019  PT End of Session - 07/31/19 1002    Visit Number  10    Number of Visits  16    Date for PT Re-Evaluation  09/11/19    Authorization Type  CAFA  until 09/10/18    PT Start Time  1000    PT Stop Time  1045    PT Time Calculation (min)  45 min    Activity Tolerance  Patient tolerated treatment well    Behavior During Therapy  [facility] for tasks assessed/performed       Past Medical History:  Diagnosis Date  . Elevated PSA   . Left knee pain   . Low back pain   . Pre-diabetes   . Prostate cancer ([place])   . Rotator cuff disorder, right     Past Surgical History:  Procedure Laterality Date  . COLONOSCOPY    . PROSTATE BIOPSY    . PROSTATE BIOPSY    . RADIOACTIVE SEED IMPLANT N/A 01/02/2018   Procedure: RADIOACTIVE SEED IMPLANT/BRACHYTHERAPY IMPLANT;  Surgeon: Cleon Gustin, MD;  Location: [facility];  Service: Urology;  Laterality: N/A;  . SHOULDER ARTHROSCOPY WITH ROTATOR CUFF REPAIR AND SUBACROMIAL DECOMPRESSION Right 08/05/2017   Procedure: RIGHT SHOULDER ARTHROSCOPY WITH ROTATOR CUFF REPAIR, DISTAL CLAVICLE EXCISION, DEBRIDEMENT AND SUBACROMIAL DECOMPRESSION;  Surgeon: Leandrew Koyanagi, MD;  Location: [place];  Service: Orthopedics;  Laterality: Right;  . SPACE OAR INSTILLATION N/A 01/02/2018   Procedure: SPACE OAR INSTILLATION;  Surgeon: Cleon Gustin, MD;  Location: [facility];  Service: Urology;  Laterality: N/A;    There were no vitals filed for this visit.  Subjective Assessment - 07/31/19 1005    Subjective  Patient reports back pain that increased on Friday, nothing caused the pain it  just happened when woke up. He is feeling much better today.    Patient Stated Goals  be able to play tennis and decrease pain    Currently in Pain?  Yes    Pain Score  2     Pain Location  Back    Pain Orientation  Lower    Pain Descriptors / Indicators  Tightness    Pain Type  Chronic pain    Pain Radiating Towards  none    Pain Onset  More than a month ago    Pain Frequency  Intermittent    Aggravating Factors   standing    Pain Relieving Factors  rest    Multiple Pain Sites  No         [facility] PT Assessment - 07/31/19 0001      Assessment   Medical Diagnosis  Chronic left-sided low back pain, unspecified whether sciatica present    Referring Provider (PT)  Leandrew Koyanagi, MD      AROM   AROM Assessment Site  Lumbar    Lumbar Flexion  [facility]    Lumbar Extension  75%    Lumbar - Right Side Bend   [facility]    Lumbar - Left Side Bend  [facility]    Lumbar - Right Rotation  75%    Lumbar - Left Rotation  75%      Strength   Right/Left  Hip  Right;Left    Right Hip Extension  4/5    Right Hip ABduction  4-/5    Left Hip Extension  4/5    Left Hip ABduction  4-/5                   [facility] Adult PT Treatment/Exercise - 07/31/19 0001      Exercises   Exercises  Lumbar;Knee/Hip      Lumbar Exercises: Stretches   Passive Hamstring Stretch  2 reps;30 seconds    Passive Hamstring Stretch Limitations  Supine with strap    Single Knee to Chest Stretch  2 reps;30 seconds    Lower Trunk Rotation  5 reps;10 seconds    Other Lumbar Stretch Exercise  Standing forward bend hamstring/lumbar flexion stretch into lumbar extension x10    Other Lumbar Stretch Exercise  Standing alternating lumbar side bending x10      Lumbar Exercises: Aerobic   Nustep  L6 x 8 min LE/UE      Lumbar Exercises: Standing   Lifting  10 reps;Weights   3 sets from 6"   Lifting Weights (lbs)  30 lbs    Lifting Limitations  Verbal and tactile cueing for hip hinge technqiue and maintaining neutral spine      Other Standing Lumbar Exercises  Standing trunk rotation with elbows by side x10 each with red band      Lumbar Exercises: Supine   Bridge  10 reps;5 seconds             PT Education - 07/31/19 1001    Education Details  HEP    Person(s) Educated  Patient    Methods  Explanation;Demonstration;Verbal cues    Comprehension  Verbalized understanding;Returned demonstration;Verbal cues required;Need further instruction       PT Short Term Goals - 05/15/19 0953      PT SHORT TERM GOAL #1   Title  Patient will demosntrate full passive left shoulder flexion without pain     Baseline  improved but still limited    Time  3    Period  Weeks    Status  On-going      PT SHORT TERM GOAL #2   Title  Patient will demostrate 5/5 gross left shoulder strength     Baseline  improving but still limoted in flexion and ER    Time  3    Period  Weeks    Status  On-going      PT SHORT TERM GOAL #3   Title  Patient will improve active left shoulder flexion and abduction     Baseline  improved significantly since eval but still limited    Time  4    Status  On-going    Target Date  04/25/19      PT SHORT TERM GOAL #4   Title  Patient will demostrate 3/5 right shoulder flexion strength below 90 degrees     Baseline  4/5 to 120 dgrees     Time  4    Period  Weeks    Status  On-going        PT Long Term Goals - 07/31/19 1009      PT LONG TERM GOAL #1   Title  Patient will sleep through the night without back pain (target for all LTG 08/02/19)    Time  6    Period  Weeks    Status  Achieved      PT LONG TERM GOAL #  2   Title  Patient will return to tennis with only mild back pain less than 3/10.    Time  6    Period  Weeks    Status  Revised    Target Date  09/11/19      PT LONG TERM GOAL #3   Title  Pt will be able to stand >1 hour with only mild pain 3/10    Time  6    Period  Weeks    Status  Revised    Target Date  09/11/19            Plan - 07/31/19 1339     Clinical Impression Statement  Patient is continuing to do well, this visit focused mostly on stretching and reviewing exercises as he reported increased bilateral low back pain/tightness. He continues to exhibit limited strength but his range of motion has improved. He was encouraged to try playing tennis to see how he feels. His HEP was not progressed this visit. He would benefit from continued skiled PT to progress his strength in order reduce pain with standing and other activities such as tennis.    PT Frequency  --   every other week   PT Duration  6 weeks    PT Treatment/Interventions  ADLs/Self Care Home Management;Cryotherapy;Dentist;Therapeutic activities;Therapeutic exercise;Neuromuscular re-education;Patient/family education;Manual techniques;Passive range of motion;Dry needling;Taping;Aquatic Therapy;Iontophoresis 4mg /ml Dexamethasone;Spinal Manipulations;Joint Manipulations    PT Next Visit Plan  Lumbar and hip mobility/stretching, progress hip and core strengthening    PT Home Exercise Plan  Supine pirformis stretch, supine hamstring stretch with strap, supine lower trunk rotations with feet elevated, marching bridge, dead bug, clamshell, sit-to-stand, side lying open book stretch, standing trunk rotation with red band elbows by side, standing forward bend    Consulted and Agree with Plan of Care  Patient       Patient will benefit from skilled therapeutic intervention in order to improve the following deficits and impairments:  Pain, Decreased activity tolerance, Decreased endurance, Decreased range of motion, Decreased strength, Impaired UE functional use, Increased muscle spasms  Visit Diagnosis: Chronic bilateral low back pain without sciatica     Problem List Patient Active Problem List   Diagnosis Date Noted  . Chronic left shoulder pain 09/05/2018  . Hyperlipidemia 08/15/2018  . Obesity (BMI 30-39.9) 08/15/2018  .  Skin tag 08/15/2018  . Malignant neoplasm of prostate ([place]) 10/13/2017  . Rotator cuff tear arthropathy of right shoulder 08/31/2017  . Personal history of gastric ulcer 02/09/2017    Hilda Blades, PT, DPT, LAT, ATC 07/31/19  2:00 PM Phone: [phone] Fax: [facility] [address] [place], [place], [address] Phone: [phone]   Fax:  [phone]  Name: Jaxen Mcateer MRN: [id number] Date of Birth: Feb 06, 1956

## 2019-07-31 NOTE — Telephone Encounter (Signed)
error 

## 2019-08-06 ENCOUNTER — Ambulatory Visit: Payer: Self-pay | Attending: Internal Medicine | Admitting: Pharmacist

## 2019-08-06 ENCOUNTER — Other Ambulatory Visit: Payer: Self-pay

## 2019-08-06 DIAGNOSIS — Z23 Encounter for immunization: Secondary | ICD-10-CM

## 2019-08-06 MED FILL — TAMSULOSIN HCL 0.4 MG CAP: 0.4 | 30 days supply | Qty: 30 | Fill #0

## 2019-08-06 NOTE — Progress Notes (Signed)
Patient presents for vaccination against strep pneumo and tetanus per orders of Dr. Wynetta Emery. Consent given. Counseling provided. No contraindications exists. Vaccine administered without incident.

## 2019-08-14 ENCOUNTER — Other Ambulatory Visit: Payer: Self-pay

## 2019-08-14 ENCOUNTER — Ambulatory Visit: Payer: Self-pay | Attending: Orthopaedic Surgery | Admitting: Physical Therapy

## 2019-08-14 ENCOUNTER — Encounter: Payer: Self-pay | Admitting: Physical Therapy

## 2019-08-14 DIAGNOSIS — M6281 Muscle weakness (generalized): Secondary | ICD-10-CM | POA: Insufficient documentation

## 2019-08-14 DIAGNOSIS — M545 Low back pain: Secondary | ICD-10-CM | POA: Insufficient documentation

## 2019-08-14 DIAGNOSIS — M25612 Stiffness of left shoulder, not elsewhere classified: Secondary | ICD-10-CM | POA: Insufficient documentation

## 2019-08-14 DIAGNOSIS — G8929 Other chronic pain: Secondary | ICD-10-CM | POA: Insufficient documentation

## 2019-08-14 NOTE — Therapy (Signed)
[place], [place], [address] Phone: [phone]   Fax:  [phone]  Physical Therapy Treatment  Patient Details  Name: Roy Reed MRN: [id number] Date of Birth: 05/12/1956 Referring Provider (PT): Leandrew Koyanagi, MD   Encounter Date: 08/14/2019  PT End of Session - 08/14/19 0928    Visit Number  11    Number of Visits  16    Date for PT Re-Evaluation  09/11/19    Authorization Type  CAFA  until 09/10/18    PT Start Time  0918    PT Stop Time  1000    PT Time Calculation (min)  42 min    Activity Tolerance  Patient tolerated treatment well    Behavior During Therapy  [facility] for tasks assessed/performed       Past Medical History:  Diagnosis Date  . Elevated PSA   . Left knee pain   . Low back pain   . Pre-diabetes   . Prostate cancer ([place])   . Rotator cuff disorder, right     Past Surgical History:  Procedure Laterality Date  . COLONOSCOPY    . PROSTATE BIOPSY    . PROSTATE BIOPSY    . RADIOACTIVE SEED IMPLANT N/A 01/02/2018   Procedure: RADIOACTIVE SEED IMPLANT/BRACHYTHERAPY IMPLANT;  Surgeon: Cleon Gustin, MD;  Location: [facility];  Service: Urology;  Laterality: N/A;  . SHOULDER ARTHROSCOPY WITH ROTATOR CUFF REPAIR AND SUBACROMIAL DECOMPRESSION Right 08/05/2017   Procedure: RIGHT SHOULDER ARTHROSCOPY WITH ROTATOR CUFF REPAIR, DISTAL CLAVICLE EXCISION, DEBRIDEMENT AND SUBACROMIAL DECOMPRESSION;  Surgeon: Leandrew Koyanagi, MD;  Location: [facility];  Service: Orthopedics;  Laterality: Right;  . SPACE OAR INSTILLATION N/A 01/02/2018   Procedure: SPACE OAR INSTILLATION;  Surgeon: Cleon Gustin, MD;  Location: [facility];  Service: Urology;  Laterality: N/A;    There were no vitals filed for this visit.  Subjective Assessment - 08/14/19 0924    Subjective  Patient reports he has played tennis, last time this past Friday. He has also been  walking more and his back is feeling good. He does notice more pain on the inside of his right knee and left shoulder.    Currently in Pain?  No/denies         [facility] PT Assessment - 08/14/19 0001      Observation/Other Assessments   Focus on Therapeutic Outcomes (FOTO)   36% limitation      Strength   Strength Assessment Site  Knee;Hip    Right/Left Hip  Right;Left    Right Hip Extension  4/5    Right Hip ABduction  4-/5    Left Hip Extension  4/5    Left Hip ABduction  4-/5    Right/Left Knee  Right;Left    Right Knee Flexion  4+/5    Right Knee Extension  4+/5    Left Knee Flexion  5/5    Left Knee Extension  5/5                   [facility] Adult PT Treatment/Exercise - 08/14/19 0001      Exercises   Exercises  Lumbar;Knee/Hip      Lumbar Exercises: Stretches   Passive Hamstring Stretch  30 seconds    Single Knee to Chest Stretch  30 seconds    Quad Stretch  3 reps;20 seconds    Quad Stretch Limitations  Thomas stretch with strap  Other Lumbar Stretch Exercise  Standing forward bend hamstring/lumbar flexion stretch into lumbar extension x10      Lumbar Exercises: Aerobic   Nustep  L6 x 8 min LE/UE      Lumbar Exercises: Sidelying   Hip Abduction  10 reps   2 sets     Knee/Hip Exercises: Supine   Straight Leg Raises  2 sets;20 reps      Shoulder Exercises: Standing   External Rotation  15 reps;Theraband   double   Theraband Level (Shoulder External Rotation)  Level 2 (Red)    Row  15 reps;Theraband    Theraband Level (Shoulder Row)  Level 2 (Red)    Other Standing Exercises  Horizontal abduction with red band x15             PT Education - 08/14/19 0927    Education Details  HEP, left shoulder and right knee    Person(s) Educated  Patient    Methods  Explanation;Demonstration;Verbal cues;Handout    Comprehension  Verbalized understanding;Returned demonstration;Verbal cues required         PT Long Term Goals - 08/14/19 0929      PT  LONG TERM GOAL #1   Title  Patient will sleep through the night without back pain    Time  6    Period  Weeks    Status  Achieved      PT LONG TERM GOAL #2   Title  Patient will return to tennis with only mild back pain less than 3/10.    Time  6    Period  Weeks    Status  Achieved    Target Date  09/11/19      PT LONG TERM GOAL #3   Title  Pt will be able to stand >1 hour with only mild pain 3/10    Time  6    Period  Weeks    Status  On-going    Target Date  09/10/18            Plan - 08/14/19 1220    Clinical Impression Statement  Patient seems to be doing much better in regard to his low back and was able to tolerate increased walking and tennis over the past weeks with no increased pain. He did report some knee and shoulder discomfort so he was provided with exercises that would help his lower back and help to address this areas as well. He would benefit from continued skilled PT to continue his strengthening and mobility work and ensure he is progressing activity level with increased pain.    PT Treatment/Interventions  ADLs/Self Care Home Management;Cryotherapy;Dentist;Therapeutic activities;Therapeutic exercise;Neuromuscular re-education;Patient/family education;Manual techniques;Passive range of motion;Dry needling;Taping;Aquatic Therapy;Iontophoresis 4mg /ml Dexamethasone;Spinal Manipulations;Joint Manipulations    PT Next Visit Plan  Lumbar and hip mobility/stretching, progress hip and core strengthening    PT Home Exercise Plan  Supine pirformis stretch, supine hamstring stretch with strap, supine lower trunk rotations with feet elevated, marching bridge, dead bug, SLR, side lying hip abduction, sit-to-stand, side lying open book stretch, thomas stretsh with strap, standing trunk rotation with red band elbows by side, standing forward bend; banded double ER, row, horiz abduction with red    Consulted and Agree with  Plan of Care  Patient       Patient will benefit from skilled therapeutic intervention in order to improve the following deficits and impairments:  Pain, Decreased activity tolerance, Decreased endurance, Decreased range of motion, Decreased strength,  Impaired UE functional use, Increased muscle spasms  Visit Diagnosis: Chronic bilateral low back pain without sciatica     Problem List Patient Active Problem List   Diagnosis Date Noted  . Chronic left shoulder pain 09/05/2018  . Hyperlipidemia 08/15/2018  . Obesity (BMI 30-39.9) 08/15/2018  . Skin tag 08/15/2018  . Malignant neoplasm of prostate ([place]) 10/13/2017  . Rotator cuff tear arthropathy of right shoulder 08/31/2017  . Personal history of gastric ulcer 02/09/2017    Hilda Blades, PT, DPT, LAT, ATC 08/14/19  12:35 PM Phone: [phone] Fax: [employer] [facility] [address] [place], [place], [address] Phone: [phone]   Fax:  [phone]  Name: Klinton Aldridge MRN: [id number] Date of Birth: June 08, 1956

## 2019-08-14 NOTE — Patient Instructions (Signed)
Access Code: [email or web address]  URL: [email or web address]  Date: 08/14/2019  Prepared by: Hilda Blades   Exercises Supine Active Straight Leg Raise - 20 reps - 2 sets Sidelying Hip Abduction - 10 reps - 2 sets Supine Quadriceps Stretch with Strap on Table - 3 reps - 20 seconds hold Shoulder W - External Rotation with Resistance - 15 reps - 2 sets Standing Shoulder Horizontal Abduction with Resistance - 15 reps - 2 sets Banded Row - 15 reps - 2 sets

## 2019-08-23 MED FILL — ?ATORVASTATIN 10 MG TABLET: 10 | 30 days supply | Qty: 30 | Fill #2

## 2019-08-28 ENCOUNTER — Ambulatory Visit: Payer: Self-pay | Admitting: Physical Therapy

## 2019-08-29 ENCOUNTER — Encounter: Payer: Self-pay | Admitting: Physical Therapy

## 2019-08-29 ENCOUNTER — Other Ambulatory Visit: Payer: Self-pay

## 2019-08-29 ENCOUNTER — Ambulatory Visit: Payer: Self-pay | Admitting: Physical Therapy

## 2019-08-29 DIAGNOSIS — M6281 Muscle weakness (generalized): Secondary | ICD-10-CM

## 2019-08-29 DIAGNOSIS — G8929 Other chronic pain: Secondary | ICD-10-CM

## 2019-08-29 DIAGNOSIS — M25612 Stiffness of left shoulder, not elsewhere classified: Secondary | ICD-10-CM

## 2019-08-29 NOTE — Therapy (Signed)
[place], [place], [address] Phone: [phone]   Fax:  [phone]  Physical Therapy Treatment  Patient Details  Name: Roy Reed MRN: [id number] Date of Birth: 08-17-1956 Referring Provider (PT): Leandrew Koyanagi, MD   Encounter Date: 08/29/2019  PT End of Session - 08/29/19 0824    Visit Number  12    Number of Visits  16    Date for PT Re-Evaluation  09/11/19    Authorization Type  CAFA  until 09/10/18    PT Start Time  0805    PT Stop Time  0845    PT Time Calculation (min)  40 min    Activity Tolerance  Patient tolerated treatment well    Behavior During Therapy  [facility] for tasks assessed/performed       Past Medical History:  Diagnosis Date  . Elevated PSA   . Left knee pain   . Low back pain   . Pre-diabetes   . Prostate cancer ([place])   . Rotator cuff disorder, right     Past Surgical History:  Procedure Laterality Date  . COLONOSCOPY    . PROSTATE BIOPSY    . PROSTATE BIOPSY    . RADIOACTIVE SEED IMPLANT N/A 01/02/2018   Procedure: RADIOACTIVE SEED IMPLANT/BRACHYTHERAPY IMPLANT;  Surgeon: Cleon Gustin, MD;  Location: [facility];  Service: Urology;  Laterality: N/A;  . SHOULDER ARTHROSCOPY WITH ROTATOR CUFF REPAIR AND SUBACROMIAL DECOMPRESSION Right 08/05/2017   Procedure: RIGHT SHOULDER ARTHROSCOPY WITH ROTATOR CUFF REPAIR, DISTAL CLAVICLE EXCISION, DEBRIDEMENT AND SUBACROMIAL DECOMPRESSION;  Surgeon: Leandrew Koyanagi, MD;  Location: [place];  Service: Orthopedics;  Laterality: Right;  . SPACE OAR INSTILLATION N/A 01/02/2018   Procedure: SPACE OAR INSTILLATION;  Surgeon: Cleon Gustin, MD;  Location: [facility];  Service: Urology;  Laterality: N/A;    There were no vitals filed for this visit.  Subjective Assessment - 08/29/19 0811    Subjective  Patient reports his back and his shoulder have been doing well but he is having  pain in his knee. His knee is hurting when he sits down and when he plays tennis.    Patient is accompained by:  Interpreter   Eddie Dibbles [phone]   Pertinent History  was seen for recent PT with shoulder pain    Limitations  Lifting;Other (comment)    How long can you stand comfortably?  10 min    How long can you walk comfortably?  10-20  min    Diagnostic tests  lumbar XR Mild degenerative changes L4-5 and L5-S1    Patient Stated Goals  be able to play tennis and decrease pain    Currently in Pain?  No/denies                       [facility] Adult PT Treatment/Exercise - 08/29/19 0001      Self-Care   Other Self-Care Comments   reviewed RICE for knee for home and symtpom mangement pertaining to tennis       Lumbar Exercises: Stretches   Passive Hamstring Stretch  2 reps;30 seconds    Single Knee to Chest Stretch  2 reps;30 seconds      Lumbar Exercises: Aerobic   Nustep  L6 x 5 min LE/UE      Lumbar Exercises: Standing   Heel Raises Limitations  x20     Other Standing Lumbar Exercises  standing march x20  Lumbar Exercises: Supine   AB Set Limitations  reviewed ab set with glut set 2x10 5sec hold     Bridge Limitations  2x10 3 sec hold; cuing for breathing     Straight Leg Raises Limitations  2x10             PT Education - 08/29/19 0814    Education Details  reviewed knee strengthening exercises with core stabilization    Person(s) Educated  Patient    Methods  Explanation;Tactile cues;Demonstration;Verbal cues    Comprehension  Verbalized understanding;Returned demonstration;Verbal cues required;Tactile cues required       PT Short Term Goals - 05/15/19 0953      PT SHORT TERM GOAL #1   Title  Patient will demosntrate full passive left shoulder flexion without pain     Baseline  improved but still limited    Time  3    Period  Weeks    Status  On-going      PT SHORT TERM GOAL #2   Title  Patient will demostrate 5/5 gross left shoulder strength      Baseline  improving but still limoted in flexion and ER    Time  3    Period  Weeks    Status  On-going      PT SHORT TERM GOAL #3   Title  Patient will improve active left shoulder flexion and abduction     Baseline  improved significantly since eval but still limited    Time  4    Status  On-going    Target Date  04/25/19      PT SHORT TERM GOAL #4   Title  Patient will demostrate 3/5 right shoulder flexion strength below 90 degrees     Baseline  4/5 to 120 dgrees     Time  4    Period  Weeks    Status  On-going        PT Long Term Goals - 08/29/19 [id number]      PT LONG TERM GOAL #1   Title  Patient will sleep through the night without back pain    Baseline  improved back pain    Time  6    Period  Weeks    Status  Achieved      PT LONG TERM GOAL #2   Title  Patient will return to tennis with only mild back pain less than 3/10.    Baseline  has returned to tennis    Time  6    Period  Weeks    Status  Achieved      PT LONG TERM GOAL #3   Title  Pt will be able to stand >1 hour with only mild pain 3/10    Time  6    Period  Weeks    Status  On-going      PT LONG TERM GOAL #4   Title  Patient will demonstrate a 33% limitation on FOTO     Baseline  working on sprot specific training     Time  8    Period  Weeks    Status  On-going            Plan - 08/29/19 0850    Clinical Impression Statement  Patient tolerated treatment well. He had minor pain in his knee with standing marching. he otherwise did well. Therapy gave him back ther-ex that was more geared toward his knee as well. Hewas advised that the  best thing to do for his knee is to continue to strengthen. Therpay also reviewed RICE for the knee. He askedabout playing tennis. The patient was advised if he has pain and swelling not to play tennis but if he feels OK to give it a try.    Personal Factors and Comorbidities  Comorbidity 2    Comorbidities  low back pain. Right shoulder RTC repai 2019.      Examination-Activity Limitations  Caring for Others;Carry;Sleep;Hygiene/Grooming    Stability/Clinical Decision Making  Stable/Uncomplicated    Clinical Decision Making  Low    Rehab Potential  Good    PT Duration  6 weeks    PT Next Visit Plan  Lumbar and hip mobility/stretching, progress hip and core strengthening    PT Home Exercise Plan  Supine pirformis stretch, supine hamstring stretch with strap, supine lower trunk rotations with feet elevated, marching bridge, dead bug, SLR, side lying hip abduction, sit-to-stand, side lying open book stretch, thomas stretsh with strap, standing trunk rotation with red band elbows by side, standing forward bend; banded double ER, row, horiz abduction with red    Consulted and Agree with Plan of Care  Patient       Patient will benefit from skilled therapeutic intervention in order to improve the following deficits and impairments:  Pain, Decreased activity tolerance, Decreased endurance, Decreased range of motion, Decreased strength, Impaired UE functional use, Increased muscle spasms  Visit Diagnosis: Chronic bilateral low back pain without sciatica  Stiffness of left shoulder, not elsewhere classified  Muscle weakness (generalized)     Problem List Patient Active Problem List   Diagnosis Date Noted  . Chronic left shoulder pain 09/05/2018  . Hyperlipidemia 08/15/2018  . Obesity (BMI 30-39.9) 08/15/2018  . Skin tag 08/15/2018  . Malignant neoplasm of prostate ([place]) 10/13/2017  . Rotator cuff tear arthropathy of right shoulder 08/31/2017  . Personal history of gastric ulcer 02/09/2017    Carney Living PT DPT 08/29/2019, 9:06 AM  [facility] [address] [place], [place], [address] Phone: [phone]   Fax:  [phone]  Name: Roy Reed MRN: [id number] Date of Birth: Jan 31, 1956

## 2019-09-10 MED FILL — TAMSULOSIN HCL 0.4 MG CAP: 0.4 | 30 days supply | Qty: 30 | Fill #1

## 2019-09-11 ENCOUNTER — Ambulatory Visit: Payer: Self-pay | Attending: Orthopaedic Surgery | Admitting: Physical Therapy

## 2019-09-11 ENCOUNTER — Encounter: Payer: Self-pay | Admitting: Physical Therapy

## 2019-09-11 ENCOUNTER — Other Ambulatory Visit: Payer: Self-pay

## 2019-09-11 DIAGNOSIS — G8929 Other chronic pain: Secondary | ICD-10-CM | POA: Insufficient documentation

## 2019-09-11 DIAGNOSIS — M545 Low back pain: Secondary | ICD-10-CM | POA: Insufficient documentation

## 2019-09-11 NOTE — Therapy (Signed)
[place], [place], [address] Phone: [phone]   Fax:  [phone]  Physical Therapy Treatment and Discharge  Progress Note Reporting Period 06/20/2019 to 09/11/2019  See note below for Objective Data and Assessment of Progress/Goals.    Patient Details  Name: Roy Reed MRN: [id number] Date of Birth: [birth year] Referring Provider (PT): Leandrew Koyanagi, MD   Encounter Date: 09/11/2019  PT End of Session - 09/11/19 0930    Visit Number  13    Date for PT Re-Evaluation  09/11/19    Authorization Type  CAFA  until 09/10/18    PT Start Time  0921    PT Stop Time  0959    PT Time Calculation (min)  38 min    Activity Tolerance  Patient tolerated treatment well    Behavior During Therapy  [facility] for tasks assessed/performed       Past Medical History:  Diagnosis Date  . Elevated PSA   . Left knee pain   . Low back pain   . Pre-diabetes   . Prostate cancer ([place])   . Rotator cuff disorder, right     Past Surgical History:  Procedure Laterality Date  . COLONOSCOPY    . PROSTATE BIOPSY    . PROSTATE BIOPSY    . RADIOACTIVE SEED IMPLANT N/A 01/02/2018   Procedure: RADIOACTIVE SEED IMPLANT/BRACHYTHERAPY IMPLANT;  Surgeon: Cleon Gustin, MD;  Location: [facility];  Service: Urology;  Laterality: N/A;  . SHOULDER ARTHROSCOPY WITH ROTATOR CUFF REPAIR AND SUBACROMIAL DECOMPRESSION Right 08/05/2017   Procedure: RIGHT SHOULDER ARTHROSCOPY WITH ROTATOR CUFF REPAIR, DISTAL CLAVICLE EXCISION, DEBRIDEMENT AND SUBACROMIAL DECOMPRESSION;  Surgeon: Leandrew Koyanagi, MD;  Location: [place];  Service: Orthopedics;  Laterality: Right;  . SPACE OAR INSTILLATION N/A 01/02/2018   Procedure: SPACE OAR INSTILLATION;  Surgeon: Cleon Gustin, MD;  Location: [facility];  Service: Urology;  Laterality: N/A;    There were no vitals filed for this visit.  Subjective  Assessment - 09/11/19 0926    Subjective  Patient reports that he is doing good in regard to his lower back. His right knee does give him trouble but it is only occasionally.    How long can you sit comfortably?  No limitation    How long can you stand comfortably?  1+ hours    How long can you walk comfortably?  1+ hours    Patient Stated Goals  be able to play tennis and decrease pain    Currently in Pain?  No/denies         [facility] PT Assessment - 09/11/19 0001      Assessment   Medical Diagnosis  Chronic left-sided low back pain, unspecified whether sciatica present    Referring Provider (PT)  Leandrew Koyanagi, MD      Prior Function   Level of Independence  Independent      Observation/Other Assessments   Focus on Therapeutic Outcomes (FOTO)   17% limitation      AROM   Overall AROM Comments  Patient states he feels good with all AROM    Lumbar Flexion  WFL    Lumbar Extension  [facility]    Lumbar - Right Side Bend  [facility]    Lumbar - Left Side Bend  [facility]    Lumbar - Right Rotation  [facility]    Lumbar - Left Rotation  [facility]      Strength  Right Hip Flexion  4/5    Right Hip Extension  4/5    Right Hip ABduction  4/5    Left Hip Flexion  4/5    Left Hip Extension  4/5    Left Hip ABduction  4/5    Right Knee Flexion  5/5    Right Knee Extension  5/5    Left Knee Flexion  5/5    Left Knee Extension  5/5                   [facility] Adult PT Treatment/Exercise - 09/11/19 0001      Exercises   Exercises  Lumbar;Knee/Hip      Lumbar Exercises: Stretches   Passive Hamstring Stretch  2 reps;30 seconds    Passive Hamstring Stretch Limitations  Supine with strap    Single Knee to Chest Stretch  2 reps;30 seconds    Lower Trunk Rotation  5 reps;10 seconds    Hip Flexor Stretch  2 reps;30 seconds    Hip Flexor Stretch Limitations  supine edge of mat      Lumbar Exercises: Aerobic   Nustep  L6 x5 min LE/UE      Lumbar Exercises: Standing   Other Standing Lumbar Exercises   Deadlift with 30# on 6" step 3x10      Lumbar Exercises: Supine   Bridge  10 reps;3 seconds      Lumbar Exercises: Sidelying   Hip Abduction  10 reps             PT Education - 09/11/19 0928    Education Details  Continue HEP, taking rest breaks with tennis, walking program    Person(s) Educated  Patient    Methods  Explanation;Demonstration    Comprehension  Verbalized understanding;Returned demonstration         PT Long Term Goals - 09/11/19 0939      PT LONG TERM GOAL #1   Title  Patient will sleep through the night without back pain    Baseline  improved back pain    Time  6    Period  Weeks    Status  Achieved      PT LONG TERM GOAL #2   Title  Patient will return to tennis with only mild back pain less than 3/10.    Baseline  has returned to tennis    Time  6    Period  Weeks    Status  Achieved      PT LONG TERM GOAL #3   Title  Pt will be able to stand >1 hour with only mild pain 3/10    Time  6    Period  Weeks    Status  Achieved      PT LONG TERM GOAL #4   Title  Patient will demonstrate a 33% limitation on FOTO     Baseline  working on sprot specific training     Time  8    Period  Weeks    Status  Achieved            Plan - 09/11/19 0930    Clinical Impression Statement  Patient reports that he is doing well and he exhibits improved strength and movement ability. At this time he is ready to transition to independent HEP and he was education on activity modification including rest breaks while playing tennis. Skilled PT is no longer indicated and patient will be discharged at this time.  PT Next Visit Plan  NA    PT Home Exercise Plan  Supine pirformis stretch, supine hamstring stretch with strap, supine lower trunk rotations with feet elevated, marching bridge, dead bug, SLR, side lying hip abduction, sit-to-stand, side lying open book stretch, thomas stretsh with strap, standing trunk rotation with red band elbows by side, standing  forward bend; banded double ER, row, horiz abduction with red    Consulted and Agree with Plan of Care  Patient       Patient will benefit from skilled therapeutic intervention in order to improve the following deficits and impairments: NA     Visit Diagnosis: Chronic bilateral low back pain without sciatica     Problem List Patient Active Problem List   Diagnosis Date Noted  . Chronic left shoulder pain 09/05/2018  . Hyperlipidemia 08/15/2018  . Obesity (BMI 30-39.9) 08/15/2018  . Skin tag 08/15/2018  . Malignant neoplasm of prostate ([place]) 10/13/2017  . Rotator cuff tear arthropathy of right shoulder 08/31/2017  . Personal history of gastric ulcer 02/09/2017    Hilda Blades, PT, DPT, LAT, ATC 09/11/19  10:09 AM Phone: [phone] Fax: [facility] [address] [place], [place], [address] Phone: [phone]   Fax:  [phone]  Name: Roy Reed MRN: [id number] Date of Birth: 1956-07-29   PHYSICAL THERAPY DISCHARGE SUMMARY  Visits from Start of Care: 13  Current functional level related to goals / functional outcomes: Patient has met all functional goals   Remaining deficits: None   Education / Equipment: HEP, patient has been given bands Plan: Patient agrees to discharge.  Patient goals were met. Patient is being discharged due to meeting the stated rehab goals.  ?????

## 2019-09-13 ENCOUNTER — Other Ambulatory Visit: Payer: Self-pay

## 2019-09-13 DIAGNOSIS — C61 Malignant neoplasm of prostate: Secondary | ICD-10-CM

## 2019-09-18 ENCOUNTER — Ambulatory Visit: Payer: Self-pay | Admitting: Physical Therapy

## 2019-09-25 ENCOUNTER — Encounter: Payer: Self-pay | Admitting: Physical Therapy

## 2019-09-25 MED FILL — ?ATORVASTATIN 10 MG TABLET: 10 | 30 days supply | Qty: 30 | Fill #0

## 2019-10-02 ENCOUNTER — Encounter: Payer: Self-pay | Admitting: Physical Therapy

## 2019-10-09 MED FILL — TAMSULOSIN HCL 0.4 MG CAP: 0.4 | 30 days supply | Qty: 30 | Fill #2

## 2019-10-12 ENCOUNTER — Encounter: Payer: Self-pay | Admitting: Internal Medicine

## 2019-10-24 MED FILL — ?ATORVASTATIN 10 MG TABLET: 10 | 30 days supply | Qty: 30 | Fill #1

## 2019-11-01 ENCOUNTER — Ambulatory Visit: Payer: Self-pay | Attending: Internal Medicine

## 2019-11-01 ENCOUNTER — Other Ambulatory Visit: Payer: Self-pay

## 2019-11-07 MED FILL — TAMSULOSIN HCL 0.4 MG CAP: 0.4 | 30 days supply | Qty: 30 | Fill #3

## 2019-11-10 ENCOUNTER — Encounter: Payer: Self-pay | Admitting: Internal Medicine

## 2019-11-10 DIAGNOSIS — C61 Malignant neoplasm of prostate: Secondary | ICD-10-CM

## 2019-11-12 ENCOUNTER — Ambulatory Visit: Payer: Self-pay | Attending: Internal Medicine

## 2019-11-12 ENCOUNTER — Other Ambulatory Visit: Payer: Self-pay

## 2019-11-12 DIAGNOSIS — E669 Obesity, unspecified: Secondary | ICD-10-CM

## 2019-11-12 DIAGNOSIS — E785 Hyperlipidemia, unspecified: Secondary | ICD-10-CM

## 2019-11-12 DIAGNOSIS — D649 Anemia, unspecified: Secondary | ICD-10-CM

## 2019-11-12 DIAGNOSIS — C61 Malignant neoplasm of prostate: Secondary | ICD-10-CM

## 2019-11-13 LAB — CBC
Hematocrit: 36.3 % — ABNORMAL LOW (ref 37.5–51.0)
Hemoglobin: 12.3 g/dL — ABNORMAL LOW (ref 13.0–17.7)
MCH: 27.5 pg (ref 26.6–33.0)
MCHC: 33.9 g/dL (ref 31.5–35.7)
MCV: 81 fL (ref 79–97)
Platelets: 217 10*3/uL (ref 150–450)
RBC: 4.48 x10E6/uL (ref 4.14–5.80)
RDW: 12.9 % (ref 11.6–15.4)
WBC: 5.1 10*3/uL (ref 3.4–10.8)

## 2019-11-13 LAB — LIPID PANEL
Chol/HDL Ratio: 2.9 ratio (ref 0.0–5.0)
Cholesterol, Total: 150 mg/dL (ref 100–199)
HDL: 52 mg/dL (ref >39)
LDL Chol Calc (NIH): 84 mg/dL (ref 0–99)
Triglycerides: 74 mg/dL (ref 0–149)
VLDL Cholesterol Cal: 14 mg/dL (ref 5–40)

## 2019-11-13 LAB — COMPREHENSIVE METABOLIC PANEL
ALT: 57 IU/L — ABNORMAL HIGH (ref 0–44)
AST: 32 IU/L (ref 0–40)
Albumin/Globulin Ratio: 1.2 (ref 1.2–2.2)
Albumin: 4.1 g/dL (ref 3.8–4.8)
Alkaline Phosphatase: 104 IU/L (ref 39–117)
BUN/Creatinine Ratio: 17 (ref 10–24)
BUN: 16 mg/dL (ref 8–27)
Bilirubin Total: <0.2 mg/dL (ref 0.0–1.2)
CO2: 26 mmol/L (ref 20–29)
Calcium: 9.3 mg/dL (ref 8.6–10.2)
Chloride: 100 mmol/L (ref 96–106)
Creatinine, Ser: 0.95 mg/dL (ref 0.76–1.27)
GFR calc Af Amer: 97 mL/min/{1.73_m2} (ref >59)
GFR calc non Af Amer: 84 mL/min/{1.73_m2} (ref >59)
Globulin, Total: 3.3 g/dL (ref 1.5–4.5)
Glucose: 83 mg/dL (ref 65–99)
Potassium: 4.4 mmol/L (ref 3.5–5.2)
Sodium: 138 mmol/L (ref 134–144)
Total Protein: 7.4 g/dL (ref 6.0–8.5)

## 2019-11-13 LAB — HEMOGLOBIN A1C
Est. average glucose Bld gHb Est-mCnc: 140 mg/dL
Hgb A1c MFr Bld: 6.5 % — ABNORMAL HIGH (ref 4.8–5.6)

## 2019-11-13 LAB — PSA: Prostate Specific Ag, Serum: <0.1 ng/mL (ref 0.0–4.0)

## 2019-11-14 ENCOUNTER — Other Ambulatory Visit: Payer: Self-pay | Admitting: Internal Medicine

## 2019-11-14 DIAGNOSIS — D649 Anemia, unspecified: Secondary | ICD-10-CM

## 2019-11-16 LAB — IRON,TIBC AND FERRITIN PANEL
Ferritin: 378 ng/mL (ref 30–400)
Iron Saturation: 22 % (ref 15–55)
Iron: 55 ug/dL (ref 38–169)
Total Iron Binding Capacity: 247 ug/dL — ABNORMAL LOW (ref 250–450)
UIBC: 192 ug/dL (ref 111–343)

## 2019-11-16 LAB — SPECIMEN STATUS REPORT

## 2019-11-27 MED FILL — ?ATORVASTATIN CALCIUM 10MG: 10 | 30 days supply | Qty: 30 | Fill #2

## 2019-11-30 ENCOUNTER — Ambulatory Visit: Payer: Self-pay | Attending: Internal Medicine | Admitting: Internal Medicine

## 2019-11-30 ENCOUNTER — Encounter: Payer: Self-pay | Admitting: Internal Medicine

## 2019-11-30 ENCOUNTER — Other Ambulatory Visit: Payer: Self-pay

## 2019-11-30 VITALS — BP 130/84 | HR 92 | Temp 98.1°F | Resp 16 | Ht 73.0 in | Wt 268.0 lb

## 2019-11-30 DIAGNOSIS — R030 Elevated blood-pressure reading, without diagnosis of hypertension: Secondary | ICD-10-CM | POA: Insufficient documentation

## 2019-11-30 DIAGNOSIS — M546 Pain in thoracic spine: Secondary | ICD-10-CM

## 2019-11-30 DIAGNOSIS — M549 Dorsalgia, unspecified: Secondary | ICD-10-CM | POA: Insufficient documentation

## 2019-11-30 DIAGNOSIS — R350 Frequency of micturition: Secondary | ICD-10-CM | POA: Insufficient documentation

## 2019-11-30 DIAGNOSIS — Z6830 Body mass index (BMI) 30.0-30.9, adult: Secondary | ICD-10-CM | POA: Insufficient documentation

## 2019-11-30 DIAGNOSIS — Z1211 Encounter for screening for malignant neoplasm of colon: Secondary | ICD-10-CM | POA: Insufficient documentation

## 2019-11-30 DIAGNOSIS — E669 Obesity, unspecified: Secondary | ICD-10-CM | POA: Insufficient documentation

## 2019-11-30 DIAGNOSIS — R351 Nocturia: Secondary | ICD-10-CM | POA: Insufficient documentation

## 2019-11-30 DIAGNOSIS — D649 Anemia, unspecified: Secondary | ICD-10-CM | POA: Insufficient documentation

## 2019-11-30 DIAGNOSIS — C61 Malignant neoplasm of prostate: Secondary | ICD-10-CM | POA: Insufficient documentation

## 2019-11-30 DIAGNOSIS — R0609 Other forms of dyspnea: Secondary | ICD-10-CM | POA: Insufficient documentation

## 2019-11-30 DIAGNOSIS — R079 Chest pain, unspecified: Secondary | ICD-10-CM | POA: Insufficient documentation

## 2019-11-30 DIAGNOSIS — R0600 Dyspnea, unspecified: Secondary | ICD-10-CM | POA: Insufficient documentation

## 2019-11-30 DIAGNOSIS — E785 Hyperlipidemia, unspecified: Secondary | ICD-10-CM | POA: Insufficient documentation

## 2019-11-30 DIAGNOSIS — R0602 Shortness of breath: Secondary | ICD-10-CM | POA: Insufficient documentation

## 2019-11-30 DIAGNOSIS — R7303 Prediabetes: Secondary | ICD-10-CM | POA: Insufficient documentation

## 2019-11-30 DIAGNOSIS — M25512 Pain in left shoulder: Secondary | ICD-10-CM | POA: Insufficient documentation

## 2019-11-30 DIAGNOSIS — Z79899 Other long term (current) drug therapy: Secondary | ICD-10-CM | POA: Insufficient documentation

## 2019-11-30 DIAGNOSIS — G8929 Other chronic pain: Secondary | ICD-10-CM | POA: Insufficient documentation

## 2019-11-30 MED ORDER — METFORMIN HCL 500 MG PO TABS: 250.0000 mg | ORAL_TABLET | Freq: Every day | ORAL | 3 refills | Status: DC

## 2019-11-30 MED ORDER — TAMSULOSIN HCL 0.4 MG PO CAPS: 0.8000 mg | ORAL_CAPSULE | Freq: Every day | ORAL | 2 refills | Status: DC

## 2019-11-30 MED ORDER — DICLOFENAC SODIUM 1 % EX GEL: 2.0000 g | Freq: Four times a day (QID) | CUTANEOUS | 1 refills | Status: DC

## 2019-11-30 MED FILL — metFORMIN HCL 500 MG TABS: 500 | 30 days supply | Qty: 15 | Fill #0

## 2019-11-30 MED FILL — DICLOFENAC SODIUM 1% GEL: 1 | 13 days supply | Qty: 100 | Fill #0

## 2019-11-30 NOTE — Progress Notes (Signed)
Patient ID: Roy Reed, male    DOB: 05/31/1956  MRN: [id number]  CC: Chronic disease management  Subjective: Roy Reed is a 64 y.o. male who presents for chronic ds management. His concerns today include:  Pt with hx of pre-DM, HL, rotator cuff tear s/p RT shoulder arthroscopy andprostate CA (hormone and radiation seeds).  Obesity/PreDM:  Still gaining wgh.  Megace on med list; reports he took only once and did not tolerate due to S.E so urology d/c. -  Recent A1C 6.5.  Blood sugar was normal. -endorses doing well with eating habits.  Thinks the problem is he is not exercising.  Feels he is not able to synchronize his breathing.  Feels SOB when walking and doing simple things like getting dress or bending over to put on shoes.  This has been going on for months.  He does not feel that it is getting worse.  He thinks it may be related to the weight gain. He gets some intermittent pain below the left breast with exertion.  This has also been going on for 2 to 3 months.  No radiation down the arm or up the neck. -No PND/orthopnea. No swelling in legs.      His other concern is pain in both flank intermittently for 2 to 3 months.  It occurs with certain movement of the trunk.  He feels like the muscles are strained at times.  No initiating factors.    No dysuria or hematuria.  HL:  Taking and tolerating Lipitor.  ALT still mildly elev at 57.  Anemia:  Iron studies revealed ACD.  Reports having had colonoscopy when he lived in [place] that he remembers as being negative.  Denies any blood in his stools.  Reports frequent urination at night.  He wakes up about 4 times.  He feels he completely empty each time.  Good flow stream.  He is on Flomax.  He drinks a lot of water during the day. Patient Active Problem List   Diagnosis Date Noted  . Chronic left shoulder pain 09/05/2018  . Hyperlipidemia 08/15/2018  . Obesity (BMI 30-39.9) 08/15/2018  . Skin tag 08/15/2018  . Malignant  neoplasm of prostate ([place]) 10/13/2017  . Rotator cuff tear arthropathy of right shoulder 08/31/2017  . Personal history of gastric ulcer 02/09/2017     Current Outpatient Medications on File Prior to Visit  Medication Sig Dispense Refill  . atorvastatin (LIPITOR) 10 MG tablet Take 1 tablet (10 mg total) by mouth daily. 30 tablet 5   No current facility-administered medications on file prior to visit.    Allergies  Allergen Reactions  . Aspirin     States he can take if he takes acid reducer medicine first    Social History   Socioeconomic History  . Marital status: Married    Spouse name: Not on file  . Number of children: Not on file  . Years of education: Not on file  . Highest education level: Not on file  Occupational History  . Not on file  Tobacco Use  . Smoking status: Never Smoker  . Smokeless tobacco: Never Used  Substance and Sexual Activity  . Alcohol use: Not Currently  . Drug use: No  . Sexual activity: Not on file  Other Topics Concern  . Not on file  Social History Narrative  . Not on file   Social Determinants of Health   Financial Resource Strain:   . Difficulty of Paying Living Expenses:  Food Insecurity:   . Worried About Charity fundraiser in the Last Year:   . Arboriculturist in the Last Year:   Transportation Needs:   . Film/video editor (Medical):   Marland Kitchen Lack of Transportation (Non-Medical):   Physical Activity:   . Days of Exercise per Week:   . Minutes of Exercise per Session:   Stress:   . Feeling of Stress :   Social Connections:   . Frequency of Communication with Friends and Family:   . Frequency of Social Gatherings with Friends and Family:   . Attends Religious Services:   . Active Member of Clubs or Organizations:   . Attends Archivist Meetings:   Marland Kitchen Marital Status:   Intimate Partner Violence:   . Fear of Current or Ex-Partner:   . Emotionally Abused:   Marland Kitchen Physically Abused:   . Sexually Abused:     No  family history on file.  Past Surgical History:  Procedure Laterality Date  . COLONOSCOPY    . PROSTATE BIOPSY    . PROSTATE BIOPSY    . RADIOACTIVE SEED IMPLANT N/A 01/02/2018   Procedure: RADIOACTIVE SEED IMPLANT/BRACHYTHERAPY IMPLANT;  Surgeon: Cleon Gustin, MD;  Location: [facility];  Service: Urology;  Laterality: N/A;  . SHOULDER ARTHROSCOPY WITH ROTATOR CUFF REPAIR AND SUBACROMIAL DECOMPRESSION Right 08/05/2017   Procedure: RIGHT SHOULDER ARTHROSCOPY WITH ROTATOR CUFF REPAIR, DISTAL CLAVICLE EXCISION, DEBRIDEMENT AND SUBACROMIAL DECOMPRESSION;  Surgeon: Leandrew Koyanagi, MD;  Location: [facility];  Service: Orthopedics;  Laterality: Right;  . SPACE OAR INSTILLATION N/A 01/02/2018   Procedure: SPACE OAR INSTILLATION;  Surgeon: Cleon Gustin, MD;  [facility];  Service: Urology;  Laterality: N/A;    ROS: Review of Systems Negative except as stated above  PHYSICAL EXAM: BP 130/84   Pulse 92   Temp 98.1 F (36.7 C)   Resp 16   Ht 6\' 1"  (1.854 m)   Wt 268 lb (121.6 kg)   SpO2 97%   BMI 35.36 kg/m   Wt Readings from Last 3 Encounters:  11/30/19 268 lb (121.6 kg)  03/15/19 264 lb (119.7 kg)  08/14/18 249 lb 9.6 oz (113.2 kg)    Physical Exam General appearance - alert, well appearing, older male and in no distress Mental status - normal mood, behavior, speech, dress, motor activity, and thought processes Neck - supple, no significant adenopathy Chest - clear to auscultation, no wheezes, rales or rhonchi, symmetric air entry Heart - normal rate, regular rhythm, normal S1, S2, no murmurs, rubs, clicks or gallops Extremities - peripheral pulses normal, no pedal edema, no clubbing or cyanosis MSK: Mild tenderness on palpation of both flank and the lower thoracic spine. CMP Latest Ref Rng & Units 11/12/2019 12/13/2018 08/14/2018  Glucose 65 - 99 mg/dL 83 - -  BUN 8 - 27 mg/dL 16 - -  Creatinine 0.76 - 1.27 mg/dL 0.95  - -  Sodium 134 - 144 mmol/L 138 - -  Potassium 3.5 - 5.2 mmol/L 4.4 - -  Chloride 96 - 106 mmol/L 100 - -  CO2 20 - 29 mmol/L 26 - -  Calcium 8.6 - 10.2 mg/dL 9.3 - -  Total Protein 6.0 - 8.5 g/dL 7.4 7.2 7.5  Total Bilirubin 0.0 - 1.2 mg/dL <0.2 0.3 0.3  Alkaline Phos 39 - 117 IU/L 104 106 118(H)  AST 0 - 40 IU/L 32 29 38  ALT 0 - 44 IU/L  57(H) 53(H) 94(H)   Lipid Panel     Component Value Date/Time   CHOL 150 11/12/2019 1706   TRIG 74 11/12/2019 1706   HDL 52 11/12/2019 1706   CHOLHDL 2.9 11/12/2019 1706   LDLCALC 84 11/12/2019 1706    CBC    Component Value Date/Time   WBC 5.1 11/12/2019 1706   WBC 5.2 12/26/2017 0853   RBC 4.48 11/12/2019 1706   RBC 4.77 12/26/2017 0853   HGB 12.3 (L) 11/12/2019 1706   HCT 36.3 (L) 11/12/2019 1706   PLT 217 11/12/2019 1706   MCV 81 11/12/2019 1706   MCH 27.5 11/12/2019 1706   MCH 27.0 12/26/2017 0853   MCHC 33.9 11/12/2019 1706   MCHC 32.7 12/26/2017 0853   RDW 12.9 11/12/2019 1706   LYMPHSABS 2.6 02/28/2017 1651   EOSABS 0.0 02/28/2017 1651   BASOSABS 0.0 02/28/2017 1651   EKG revealed normal sinus rhythm.  Normal EKG unchanged from previous. ASSESSMENT AND PLAN: 1. Prediabetes Discussed healthy eating habits. Discussed the importance of regular exercise.  However I would have him hold off on starting an exercise program until he is seen the cardiologist for evaluation of the chest pain with shortness of breath that he has been experiencing for the past several months - metFORMIN (GLUCOPHAGE) 500 MG tablet; Take 0.5 tablets (250 mg total) by mouth daily with breakfast.  Dispense: 30 tablet; Refill: 3  2. Obesity (BMI 30-39.9) See #1 above  3. Hyperlipidemia, unspecified hyperlipidemia type Continue atorvastatin  4. Chest pain in adult 5. Dyspnea on exertion -I think deconditioning and weight gain playing a role with the dyspnea on exertion.  However given that he is also having chest pains, I think it is reasonable to  have him evaluated by cardiology before instructing him on starting an exercise program. - EKG 12-Lead - Ambulatory referral to Cardiology   6. Elevated blood pressure reading Blood pressure was elevated at 142/82 today.  Repeat was 130/84.  DASH diet discussed and encouraged.  Follow-up with our clinical pharmacist in 6 weeks.  If blood pressure still elevated, we can start him on low-dose of amlodipine.  7. Normocytic anemia - Ambulatory referral to Gastroenterology  8. Chronic bilateral thoracic back pain I think this is musculoskeletal in nature.  We will try him with topical anti-inflammatory. - diclofenac Sodium (VOLTAREN) 1 % GEL; Apply 2 g topically 4 (four) times daily.  Dispense: 100 g; Refill: 1  9. Prostate cancer ([place]) Follow the nocturia, I recommend increasing the Flomax to 2 tablets daily. - tamsulosin (FLOMAX) 0.4 MG CAPS capsule; Take 2 capsules (0.8 mg total) by mouth daily.  Dispense: 60 capsule; Refill: 2  10. Screening for colon cancer - Ambulatory referral to Gastroenterology     Patient was given the opportunity to ask questions.  Patient verbalized understanding of the plan and was able to repeat key elements of the plan.  [employer] interpreter used during this encounter.  [id number]  Orders Placed This Encounter  Procedures  . Ambulatory referral to Gastroenterology  . Ambulatory referral to Cardiology  . EKG 12-Lead     Requested Prescriptions   Signed Prescriptions Disp Refills  . metFORMIN (GLUCOPHAGE) 500 MG tablet 30 tablet 3    Sig: Take 0.5 tablets (250 mg total) by mouth daily with breakfast.  . diclofenac Sodium (VOLTAREN) 1 % GEL 100 g 1    Sig: Apply 2 g topically 4 (four) times daily.  . tamsulosin (FLOMAX) 0.4 MG CAPS capsule 60 capsule  2    Sig: Take 2 capsules (0.8 mg total) by mouth daily.    Return in about 3 months (around 03/01/2020).  Karle Plumber, MD, FACP

## 2019-11-30 NOTE — Patient Instructions (Signed)
Follow up with clinical pharmacist in 6 weeks for blood pressure recheck.

## 2019-11-30 NOTE — Progress Notes (Signed)
Here for routine cholesterol and prostate F/U

## 2019-12-04 MED FILL — TAMSULOSIN HCL 0.4 MG CAP: 0.4 | 30 days supply | Qty: 30 | Fill #4

## 2019-12-06 NOTE — Progress Notes (Signed)
Cardiology Office Note:    Date:  12/07/2019   ID:  Roy Reed, DOB 06-Feb-1956, MRN [id number]  PCP:  Ladell Pier, MD  Cardiologist:  Donato Heinz, MD  Electrophysiologist:  None   Referring MD: Ladell Pier, MD   Chief Complaint  Patient presents with  . Chest Pain    History of Present Illness:    Roy Reed is a 64 y.o. male with a hx of prostate cancer, hyperlipidemia, diabetes who is referred by Dr. Wynetta Emery for evaluation of chest pain.  He reports that over the last 3 months he has had pain on the left side of his chest.  Describes as sharp pain.  Reports pain has been constant over the last 3 months, but is worse with certain movements.  Does not worsen with deep breathing.  States that he used to play tennis and had shoulder issues and was doing physical therapy.  Reports pain started around the time he was doing his therapy.  Reports that he gets short of breath with minimal exertion.  No smoking history.  No history of heart disease in his immediate family.   Past Medical History:  Diagnosis Date  . Elevated PSA   . Left knee pain   . Low back pain   . Pre-diabetes   . Prostate cancer ([place])   . Rotator cuff disorder, right     Past Surgical History:  Procedure Laterality Date  . COLONOSCOPY    . PROSTATE BIOPSY    . PROSTATE BIOPSY    . RADIOACTIVE SEED IMPLANT N/A 01/02/2018   Procedure: RADIOACTIVE SEED IMPLANT/BRACHYTHERAPY IMPLANT;  Surgeon: Cleon Gustin, MD;  Location[facility];  Service: Urology;  Laterality: N/A;  . SHOULDER ARTHROSCOPY WITH ROTATOR CUFF REPAIR AND SUBACROMIAL DECOMPRESSION Right 08/05/2017   Procedure: RIGHT SHOULDER ARTHROSCOPY WITH ROTATOR CUFF REPAIR, DISTAL CLAVICLE EXCISION, DEBRIDEMENT AND SUBACROMIAL DECOMPRESSION;  Surgeon: Leandrew Koyanagi, MD;  Location: [place];  Service: Orthopedics;  Laterality: Right;  . SPACE OAR INSTILLATION N/A 01/02/2018   Procedure:  SPACE OAR INSTILLATION;  Surgeon: Cleon Gustin, MD;  Location: [facility];  Service: Urology;  Laterality: N/A;    Current Medications: Current Meds  Medication Sig  . atorvastatin (LIPITOR) 10 MG tablet Take 1 tablet (10 mg total) by mouth daily.  . diclofenac Sodium (VOLTAREN) 1 % GEL Apply 2 g topically 4 (four) times daily.  . metFORMIN (GLUCOPHAGE) 500 MG tablet Take 0.5 tablets (250 mg total) by mouth daily with breakfast.  . tamsulosin (FLOMAX) 0.4 MG CAPS capsule Take 2 capsules (0.8 mg total) by mouth daily.     Allergies:   Aspirin   Social History   Socioeconomic History  . Marital status: Married    Spouse name: Not on file  . Number of children: Not on file  . Years of education: Not on file  . Highest education level: Not on file  Occupational History  . Not on file  Tobacco Use  . Smoking status: Never Smoker  . Smokeless tobacco: Never Used  Substance and Sexual Activity  . Alcohol use: Not Currently  . Drug use: No  . Sexual activity: Not on file  Other Topics Concern  . Not on file  Social History Narrative  . Not on file   Social Determinants of Health   Financial Resource Strain:   . Difficulty of Paying Living Expenses:   Food Insecurity:   . Worried About Running  Out of Food in the Last Year:   . [place] in the Last Year:   Transportation Needs:   . Lack of Transportation (Medical):   Marland Kitchen Lack of Transportation (Non-Medical):   Physical Activity:   . Days of Exercise per Week:   . Minutes of Exercise per Session:   Stress:   . Feeling of Stress :   Social Connections:   . Frequency of Communication with Friends and Family:   . Frequency of Social Gatherings with Friends and Family:   . Attends Religious Services:   . Active Member of Clubs or Organizations:   . Attends Archivist Meetings:   Marland Kitchen Marital Status:      Family History: The patient's family history is not on file.  ROS:   Please see  the history of present illness.    All other systems reviewed and are negative.  EKGs/Labs/Other Studies Reviewed:    The following studies were reviewed today:   EKG:  EKG is ordered today.  The ekg ordered today demonstrates normal sinus rhythm, rate 94, no ST/T abnormalities  Recent Labs: 11/12/2019: ALT 57; BUN 16; Creatinine, Ser 0.95; Hemoglobin 12.3; Platelets 217; Potassium 4.4; Sodium 138  Recent Lipid Panel    Component Value Date/Time   CHOL 150 11/12/2019 1706   TRIG 74 11/12/2019 1706   HDL 52 11/12/2019 1706   CHOLHDL 2.9 11/12/2019 1706   LDLCALC 84 11/12/2019 1706    Physical Exam:    VS:  BP 124/72   Pulse 94   Ht 6' 1"  (1.854 m)   Wt 268 lb 12.8 oz (121.9 kg)   SpO2 96%   BMI 35.46 kg/m     Wt Readings from Last 3 Encounters:  12/07/19 268 lb 12.8 oz (121.9 kg)  11/30/19 268 lb (121.6 kg)  03/15/19 264 lb (119.7 kg)     TMB:PJPE nourished, well developed in no acute distress HEENT: Normal NECK: No JVD CARDIAC: RRR, no murmurs, rubs, gallops RESPIRATORY:  Clear to auscultation without rales, wheezing or rhonchi  ABDOMEN: Soft, non-tender, non-distended MUSCULOSKELETAL:  No edema; No deformity  SKIN: Warm and dry NEUROLOGIC:  Alert and oriented x 3 PSYCHIATRIC:  Normal affect   ASSESSMENT:    1. Dyspnea on exertion   2. Chest pain, unspecified type   3. Precordial pain   4. Shortness of breath    PLAN:     Chest pain/DOE: Chest pain is atypical, as describes sharp left-sided pain worse with certain movements.  Suspect likely MSK pain from injury while doing physical therapy for his shoulder.  However, he does have significant CAD risk factors (hyperlipidemia, type 2 diabetes, age) and is having dyspnea with minimal exertion, which may represent anginal equivalent.  Will evaluate further with coronary CTA.  Will check ESR/CRP to evaluate for evidence of inflammation that could suggest pericarditis  Hyperlipidemia: On atorvastatin 10 mg daily.   LDL 84 on 11/12/19  Type 2 diabetes: Recent A1c 6.5.  On Metformin  RTC in 3 months  Medication Adjustments/Labs and Tests Ordered: Current medicines are reviewed at length with the patient today.  Concerns regarding medicines are outlined above.  Orders Placed This Encounter  Procedures  . CT CORONARY MORPH W/CTA COR W/SCORE W/CA W/CM &/OR WO/CM  . CT CORONARY FRACTIONAL FLOW RESERVE DATA PREP  . CT CORONARY FRACTIONAL FLOW RESERVE FLUID ANALYSIS  . Basic metabolic panel  . C-reactive protein  . Sedimentation rate  . EKG 12-Lead  Meds ordered this encounter  Medications  . metoprolol tartrate (LOPRESSOR) 50 MG tablet    Sig: Take 2 tablets (100 mg total) by mouth once for 1 dose. TAKE TWO HOURS PRIOR TO  SCHEDULE CARDIAC TEST    Dispense:  2 tablet    Refill:  0    Patient Instructions  Medication Instructions:   see instruction for CCTA *If you need a refill on your cardiac medications before your next appointment, please call your pharmacy*   Lab Work: BMP Sed rate CRP If you have labs (blood work) drawn today and your tests are completely normal, you will receive your results only by: Marland Kitchen MyChart Message (if you have MyChart) OR . A paper copy in the mail If you have any lab test that is abnormal or we need to change your treatment, we will call you to review the results.   Testing/Procedures:  will be schedule at [facility] hospital - Radiology dept.- Coronary CTA Once authorization is obtained by insurance    Follow-Up: At [facility], you and your health needs are our priority.  As part of our continuing mission to provide you with exceptional heart care, we have created designated Provider Care Teams.  These Care Teams include your primary Cardiologist (physician) and Advanced Practice Providers (APPs -  Physician Assistants and Nurse Practitioners) who all work together to provide you with the care you need, when you need it.  We recommend signing up for the  patient portal called "MyChart".  Sign up information is provided on this After Visit Summary.  MyChart is used to connect with patients for Virtual Visits (Telemedicine).  Patients are able to view lab/test results, encounter notes, upcoming appointments, etc.  Non-urgent messages can be sent to your provider as well.   To learn more about what you can do with MyChart, go to [email or web address].    Your next appointment:   3 month(s)  The format for your next appointment:   In Person  Provider:   Oswaldo Milian, MD   Other Instructions    Your cardiac CT will be scheduled at the below location:   [facility] [address] [place], [place] [address] [phone]    If scheduled at [facility], please arrive at the [facility] main entrance of [facility] 30 minutes prior to test start time. Proceed to the [facility] Radiology Department (first floor) to check-in and test prep.    Please follow these instructions carefully (unless otherwise directed):  Please have labs done to day BMP   On the Night Before the Test: . Be sure to Drink plenty of water. . Do not consume any caffeinated/decaffeinated beverages or chocolate 12 hours prior to your test. . Do not take any antihistamines 12 hours prior to your test. . If you take Metformin do not take 24 hours prior to test. .   On the Day of the Test: . Drink plenty of water. Do not drink any water within one hour of the test. . Do not eat any food 4 hours prior to the test. . You may take your regular medications prior to the test.  . Take metoprolol (Lopressor)  100 mg two hours prior to test. .           After the Test: . Drink plenty of water. . After receiving IV contrast, you may experience a mild flushed feeling. This is normal. . On occasion, you may experience a mild rash up  to 24 hours after the test. This is not dangerous. If this occurs, you can take Benadryl 25 mg  and increase your fluid intake. . If you experience trouble breathing, this can be serious. If it is severe call 911 IMMEDIATELY. If it is mild, please call our office. . If you take any of these medications: Glipizide/Metformin, Avandament, Glucavance, please do not take 48 hours after completing test unless otherwise instructed.   Once we have confirmed authorization from your insurance company, we will call you to set up a date and time for your test.   For non-scheduling related questions, please contact the cardiac imaging nurse navigator should you have any questions/concerns: Marchia Bond, RN Navigator Cardiac Imaging [facility] [phone] office  For scheduling needs, including cancellations and rescheduling, please call [phone].        Signed, Donato Heinz, MD  12/07/2019 11:21 PM    [place]

## 2019-12-07 ENCOUNTER — Ambulatory Visit (INDEPENDENT_AMBULATORY_CARE_PROVIDER_SITE_OTHER): Payer: Self-pay | Admitting: Cardiology

## 2019-12-07 ENCOUNTER — Encounter: Payer: Self-pay | Admitting: Cardiology

## 2019-12-07 ENCOUNTER — Other Ambulatory Visit: Payer: Self-pay

## 2019-12-07 VITALS — BP 124/72 | HR 94 | Ht 73.0 in | Wt 268.8 lb

## 2019-12-07 DIAGNOSIS — R0600 Dyspnea, unspecified: Secondary | ICD-10-CM

## 2019-12-07 DIAGNOSIS — E785 Hyperlipidemia, unspecified: Secondary | ICD-10-CM

## 2019-12-07 DIAGNOSIS — R079 Chest pain, unspecified: Secondary | ICD-10-CM

## 2019-12-07 DIAGNOSIS — R0609 Other forms of dyspnea: Secondary | ICD-10-CM

## 2019-12-07 DIAGNOSIS — R072 Precordial pain: Secondary | ICD-10-CM

## 2019-12-07 MED ORDER — METOPROLOL TARTRATE 50 MG PO TABS: 100.0000 mg | ORAL_TABLET | Freq: Once | ORAL | 0 refills | Status: DC

## 2019-12-07 NOTE — Patient Instructions (Signed)
Medication Instructions:   see instruction for CCTA *If you need a refill on your cardiac medications before your next appointment, please call your pharmacy*   Lab Work: BMP Sed rate CRP If you have labs (blood work) drawn today and your tests are completely normal, you will receive your results only by: Marland Kitchen MyChart Message (if you have MyChart) OR . A paper copy in the mail If you have any lab test that is abnormal or we need to change your treatment, we will call you to review the results.   Testing/Procedures:  will be schedule at [facility] hospital - Radiology dept.- Coronary CTA Once authorization is obtained by insurance    Follow-Up: At [facility], you and your health needs are our priority.  As part of our continuing mission to provide you with exceptional heart care, we have created designated Provider Care Teams.  These Care Teams include your primary Cardiologist (physician) and Advanced Practice Providers (APPs -  Physician Assistants and Nurse Practitioners) who all work together to provide you with the care you need, when you need it.  We recommend signing up for the patient portal called "MyChart".  Sign up information is provided on this After Visit Summary.  MyChart is used to connect with patients for Virtual Visits (Telemedicine).  Patients are able to view lab/test results, encounter notes, upcoming appointments, etc.  Non-urgent messages can be sent to your provider as well.   To learn more about what you can do with MyChart, go to [email or web address].    Your next appointment:   3 month(s)  The format for your next appointment:   In Person  Provider:   Oswaldo Milian, MD   Other Instructions    Your cardiac CT will be scheduled at the below location:   [facility] [address] [place], [place] [address] [phone]    If scheduled at [facility], please arrive at the [facility] main entrance of [facility] 30 minutes prior to test start time. Proceed to the [facility] Radiology Department (first floor) to check-in and test prep.    Please follow these instructions carefully (unless otherwise directed):  Please have labs done to day BMP   On the Night Before the Test: . Be sure to Drink plenty of water. . Do not consume any caffeinated/decaffeinated beverages or chocolate 12 hours prior to your test. . Do not take any antihistamines 12 hours prior to your test. . If you take Metformin do not take 24 hours prior to test. .   On the Day of the Test: . Drink plenty of water. Do not drink any water within one hour of the test. . Do not eat any food 4 hours prior to the test. . You may take your regular medications prior to the test.  . Take metoprolol (Lopressor)  100 mg two hours prior to test. .           After the Test: . Drink plenty of water. . After receiving IV contrast, you may experience a mild flushed feeling. This is normal. . On occasion, you may experience a mild rash up to 24 hours after the test. This is not dangerous. If this occurs, you can take Benadryl 25 mg and increase your fluid intake. . If you experience trouble breathing, this can be serious. If it is severe call 911 IMMEDIATELY. If it is mild, please call our office. . If you take any of these medications: Glipizide/Metformin,  Avandament, Glucavance, please do not take 48 hours after completing test unless otherwise instructed.   Once we have confirmed authorization from your insurance company, we will call you to set up a date and time for your test.   For non-scheduling related questions, please contact the cardiac imaging nurse navigator should you have any questions/concerns: Marchia Bond, RN Navigator Cardiac Imaging [facility] [phone] office  For scheduling needs, including cancellations and rescheduling, please call [phone].

## 2019-12-08 LAB — BASIC METABOLIC PANEL
BUN/Creatinine Ratio: 14 (ref 10–24)
BUN: 15 mg/dL (ref 8–27)
CO2: 22 mmol/L (ref 20–29)
Calcium: 9.6 mg/dL (ref 8.6–10.2)
Chloride: 100 mmol/L (ref 96–106)
Creatinine, Ser: 1.07 mg/dL (ref 0.76–1.27)
GFR calc Af Amer: 84 mL/min/{1.73_m2} (ref >59)
GFR calc non Af Amer: 72 mL/min/{1.73_m2} (ref >59)
Glucose: 106 mg/dL — ABNORMAL HIGH (ref 65–99)
Potassium: 4.8 mmol/L (ref 3.5–5.2)
Sodium: 139 mmol/L (ref 134–144)

## 2019-12-08 LAB — SEDIMENTATION RATE: Sed Rate: 66 mm/hr — ABNORMAL HIGH (ref 0–30)

## 2019-12-08 LAB — C-REACTIVE PROTEIN: CRP: 3 mg/L (ref 0–10)

## 2019-12-10 MED FILL — ?METOPROLOL TART 50 MG TABL: 50 | 2 days supply | Qty: 2 | Fill #0

## 2019-12-19 ENCOUNTER — Encounter: Payer: Self-pay | Admitting: Internal Medicine

## 2019-12-19 MED FILL — TAMSULOSIN HCL 0.4 MG CAP: 0.4 | 30 days supply | Qty: 30 | Fill #5

## 2019-12-27 MED FILL — ?ATORVASTATIN CALCIUM 10MG: 10 | 30 days supply | Qty: 30 | Fill #3

## 2020-01-01 MED FILL — TAMSULOSIN HCL 0.4 MG CAP: 0.4 | 30 days supply | Qty: 60 | Fill #0

## 2020-01-03 ENCOUNTER — Encounter: Payer: Self-pay | Admitting: Gastroenterology

## 2020-01-04 ENCOUNTER — Telehealth (HOSPITAL_COMMUNITY): Payer: Self-pay | Admitting: Emergency Medicine

## 2020-01-04 NOTE — Telephone Encounter (Signed)
Reaching out to patient to offer assistance regarding upcoming cardiac imaging study; pt verbalizes understanding of appt date/time, parking situation and where to check in, pre-test NPO status and medications ordered, and verified current allergies; name and call back number provided for further questions should they arise Elyssia Strausser RN Navigator Cardiac Imaging [facility] [phone] office [phone] cell 

## 2020-01-07 ENCOUNTER — Ambulatory Visit (HOSPITAL_COMMUNITY)
Admission: RE | Admit: 2020-01-07 | Discharge: 2020-01-07 | Disposition: A | Discharge status: Home / Self Care | Payer: Self-pay | Source: Ambulatory Visit | Attending: Cardiology | Admitting: Cardiology

## 2020-01-07 ENCOUNTER — Other Ambulatory Visit: Payer: Self-pay

## 2020-01-07 DIAGNOSIS — R0600 Dyspnea, unspecified: Secondary | ICD-10-CM | POA: Insufficient documentation

## 2020-01-07 DIAGNOSIS — R072 Precordial pain: Secondary | ICD-10-CM

## 2020-01-07 DIAGNOSIS — R0609 Other forms of dyspnea: Secondary | ICD-10-CM

## 2020-01-07 DIAGNOSIS — R079 Chest pain, unspecified: Secondary | ICD-10-CM | POA: Insufficient documentation

## 2020-01-07 MED ORDER — IOHEXOL 350 MG/ML SOLN
80.0000 mL | Freq: Once | INTRAVENOUS | Status: AC | PRN
  Administered 2020-01-07: 11:00:00 80 mL via INTRAVENOUS

## 2020-01-07 MED ORDER — NITROGLYCERIN 0.4 MG SL SUBL
0.8000 mg | SUBLINGUAL_TABLET | Freq: Once | SUBLINGUAL | Status: AC
  Administered 2020-01-07: 0.8 mg via SUBLINGUAL

## 2020-01-07 MED ORDER — NITROGLYCERIN 0.4 MG SL SUBL
SUBLINGUAL_TABLET | SUBLINGUAL | Status: AC
  Filled 2020-01-07: qty 2

## 2020-01-17 ENCOUNTER — Other Ambulatory Visit: Payer: Self-pay

## 2020-01-17 ENCOUNTER — Encounter: Payer: Self-pay | Admitting: Gastroenterology

## 2020-01-17 ENCOUNTER — Ambulatory Visit (INDEPENDENT_AMBULATORY_CARE_PROVIDER_SITE_OTHER): Payer: Self-pay | Admitting: Gastroenterology

## 2020-01-17 VITALS — BP 120/80 | HR 96 | Temp 98.5°F | Ht 72.0 in | Wt 268.5 lb

## 2020-01-17 DIAGNOSIS — Z01818 Encounter for other preprocedural examination: Secondary | ICD-10-CM

## 2020-01-17 DIAGNOSIS — Z1212 Encounter for screening for malignant neoplasm of rectum: Secondary | ICD-10-CM

## 2020-01-17 DIAGNOSIS — D649 Anemia, unspecified: Secondary | ICD-10-CM

## 2020-01-17 DIAGNOSIS — Z1211 Encounter for screening for malignant neoplasm of colon: Secondary | ICD-10-CM

## 2020-01-17 MED ORDER — SUTAB 1479-225-188 MG PO TABS: 1.0000 | ORAL_TABLET | ORAL | 0 refills | Status: DC

## 2020-01-17 NOTE — Patient Instructions (Signed)
If you are age 66 or older, your body mass index should be between 23-30. Your Body mass index is 36.42 kg/m. If this is out of the aforementioned range listed, please consider follow up with your Primary Care Provider.  If you are age 65 or younger, your body mass index should be between 19-25. Your Body mass index is 36.42 kg/m. If this is out of the aformentioned range listed, please consider follow up with your Primary Care Provider.   You have been scheduled for a colonoscopy. Please follow written instructions given to you at your visit today.  Please pick up your prep supplies at the pharmacy within the next 1-3 days. If you use inhalers (even only as needed), please bring them with you on the day of your procedure. Your physician has requested that you go to www.startemmi.com and enter the access code given to you at your visit today. This web site gives a general overview about your procedure. However, you should still follow specific instructions given to you by our office regarding your preparation for the procedure.  We have sent the following medications to your pharmacy for you to pick up at your convenience: Sutab  Thank you for choosing me and [facility].   Alonza Bogus, PA-C

## 2020-01-17 NOTE — Progress Notes (Signed)
01/17/2020 Roy Reed [id number] 04-11-1956   HISTORY OF PRESENT ILLNESS: This is a 64 year old male who is new to our office.  Has been referred here by his PCP, Dr. Karle Plumber, in order to discuss and schedule colonoscopy.  He tells me he has had a colonoscopy in the past, at least 10 years ago in [place], [place].  Recalls it being normal at that time.  He does have a mild normocytic anemia with hemoglobin of 12.3 g.  2 years ago was 12.9 g.  MCV is normal and iron studies are normal.  He denies any sign of GI bleeding including black or bloody stools.  He says he moves his bowels well.  Denies abdominal pain.  No complaints of heartburn/reflux indigestion.  No NSAID use.  Past Medical History:  Diagnosis Date  . Elevated PSA   . HLD (hyperlipidemia)    side effect from Tamsulosin  . Left knee pain   . Low back pain   . Pre-diabetes   . Prostate cancer ([place])   . Rotator cuff disorder, right    Past Surgical History:  Procedure Laterality Date  . COLONOSCOPY    . PROSTATE BIOPSY    . PROSTATE BIOPSY    . RADIOACTIVE SEED IMPLANT N/A 01/02/2018   Procedure: RADIOACTIVE SEED IMPLANT/BRACHYTHERAPY IMPLANT;  Surgeon: Cleon Gustin, MD;  Location: [facility];  Service: Urology;  Laterality: N/A;  . SHOULDER ARTHROSCOPY WITH ROTATOR CUFF REPAIR AND SUBACROMIAL DECOMPRESSION Right 08/05/2017   Procedure: RIGHT SHOULDER ARTHROSCOPY WITH ROTATOR CUFF REPAIR, DISTAL CLAVICLE EXCISION, DEBRIDEMENT AND SUBACROMIAL DECOMPRESSION;  Surgeon: Leandrew Koyanagi, MD;  Location: [place];  Service: Orthopedics;  Laterality: Right;  . SPACE OAR INSTILLATION N/A 01/02/2018   Procedure: SPACE OAR INSTILLATION;  Surgeon: Cleon Gustin, MD;  Location: [facility];  Service: Urology;  Laterality: N/A;    reports that he has never smoked. He has never used smokeless tobacco. He reports previous alcohol use. He reports that he does not use  drugs. family history is not on file. Allergies  Allergen Reactions  . Aspirin     States he can take if he takes acid reducer medicine first      Outpatient Encounter Medications as of 01/17/2020  Medication Sig  . atorvastatin (LIPITOR) 10 MG tablet Take 1 tablet (10 mg total) by mouth daily.  . diclofenac Sodium (VOLTAREN) 1 % GEL Apply 2 g topically 4 (four) times daily.  . tamsulosin (FLOMAX) 0.4 MG CAPS capsule Take 2 capsules (0.8 mg total) by mouth daily.  . metFORMIN (GLUCOPHAGE) 500 MG tablet Take 0.5 tablets (250 mg total) by mouth daily with breakfast. (Patient not taking: Reported on 01/17/2020)  . [DISCONTINUED] metoprolol tartrate (LOPRESSOR) 50 MG tablet Take 2 tablets (100 mg total) by mouth once for 1 dose. TAKE TWO HOURS PRIOR TO  SCHEDULE CARDIAC TEST   No facility-administered encounter medications on file as of 01/17/2020.     REVIEW OF SYSTEMS  : All other systems reviewed and negative except where noted in the History of Present Illness.   PHYSICAL EXAM: Temp 98.5 F (36.9 C)   Ht 6' (1.829 m) Comment: height measured without shoes  Wt 268 lb 8 oz (121.8 kg)   BMI 36.42 kg/m  General: Well developed AA male in no acute distress Head: Normocephalic and atraumatic Eyes:  Sclerae anicteric, conjunctiva pink. Ears: Normal auditory acuity Lungs: Clear throughout to auscultation; no increased WOB. Heart: Regular  rate and rhythm; no M/R/G. Abdomen: Soft, non-distended.  BS present.  Non-tender. Rectal:  Will be done at the time of colonoscopy. Musculoskeletal: Symmetrical with no gross deformities  Skin: No lesions on visible extremities Extremities: No edema  Neurological: Alert oriented x 4, grossly non-focal Psychological:  Alert and cooperative. Normal mood and affect  ASSESSMENT AND PLAN: *CRC screening:  Last colonoscopy was about 10 years ago in [place].  Will plan for colonoscopy with Dr. Rush Landmark.  The risks, benefits, and alternatives to  colonoscopy were discussed with the patient and he consentd to proceed.  *Mild normocytic anemia:  Hgb 12.3 grams.  Was 12.9 grams 2 years ago so is stable.  Iron studies normal.  No sign of GI bleeding or GI complaints.  Further evaluation per PCP.  CC:  Ladell Pier, MD

## 2020-01-20 NOTE — Progress Notes (Signed)
Attending Physician's Attestation   I have reviewed the chart.   I agree with the Advanced Practitioner's note, impression, and recommendations with any updates as below. Without evidence of iron deficiency agree with moving forward with planned colonoscopy.  Endoscopy to be considered if patient develops iron deficiency in the future.  Justice Britain, MD [facility] Advanced Endoscopy Office # [phone]

## 2020-01-21 ENCOUNTER — Other Ambulatory Visit: Payer: Self-pay

## 2020-01-21 ENCOUNTER — Encounter: Payer: Self-pay | Admitting: Urology

## 2020-01-21 ENCOUNTER — Ambulatory Visit (INDEPENDENT_AMBULATORY_CARE_PROVIDER_SITE_OTHER): Payer: Self-pay | Admitting: Urology

## 2020-01-21 VITALS — BP 143/86 | HR 83 | Temp 96.3°F | Ht 73.0 in | Wt 268.0 lb

## 2020-01-21 DIAGNOSIS — N138 Other obstructive and reflux uropathy: Secondary | ICD-10-CM | POA: Insufficient documentation

## 2020-01-21 DIAGNOSIS — C61 Malignant neoplasm of prostate: Secondary | ICD-10-CM

## 2020-01-21 DIAGNOSIS — R3912 Poor urinary stream: Secondary | ICD-10-CM | POA: Insufficient documentation

## 2020-01-21 DIAGNOSIS — N401 Enlarged prostate with lower urinary tract symptoms: Secondary | ICD-10-CM

## 2020-01-21 LAB — POCT URINALYSIS DIPSTICK
Bilirubin, UA: NEGATIVE
Glucose, UA: NEGATIVE
Ketones, UA: NEGATIVE
Leukocytes, UA: NEGATIVE
Nitrite, UA: NEGATIVE
Protein, UA: NEGATIVE
Spec Grav, UA: <=1.005 — AB (ref 1.010–1.025)
Urobilinogen, UA: NEGATIVE E.U./dL — AB
pH, UA: 5 (ref 5.0–8.0)

## 2020-01-21 MED ORDER — ALFUZOSIN HCL ER 10 MG PO TB24
10.0000 mg | ORAL_TABLET | Freq: Every day | ORAL | 3 refills | Status: DC
Start: 2020-01-21 — End: 2020-08-06

## 2020-01-21 MED FILL — ALFUZOSIN HCL ER 10 MG TAB: 10 | 90 days supply | Qty: 90 | Fill #0

## 2020-01-21 NOTE — Progress Notes (Signed)
Urological Symptom Review  Patient is experiencing the following symptoms: Get up at night to urinate   Review of Systems  Gastrointestinal (upper)  : Negative for upper GI symptoms  Gastrointestinal (lower) : Negative for lower GI symptoms  Constitutional : Negative for symptoms  Skin: Negative for skin symptoms  Eyes: Negative for eye symptoms  Ear/Nose/Throat : Negative for Ear/Nose/Throat symptoms  Hematologic/Lymphatic: Negative for Hematologic/Lymphatic symptoms  Cardiovascular : Negative for cardiovascular symptoms  Respiratory : Negative for respiratory symptoms  Endocrine: Negative for endocrine symptoms  Musculoskeletal: Back pain  Neurological: Negative for neurological symptoms  Psychologic: Negative for psychiatric symptoms  

## 2020-01-21 NOTE — Progress Notes (Signed)
01/21/2020 3:29 PM   Pilot Royer 11-May-1956 [id number]  Referring provider: Ladell Pier, MD [place],  [place] [address]  Prostate cancer and weak urinary stream  HPI: Mr Corpuz is a 64yo here for followup for prostate cancer and BPH with weak urinary stream.  PSA remains undetectable. His last ADT was in 06/2019. He has hot flashes daily. He has worsening urinary urgnecy, frequency, weak stream and nocturia. He was started on flomax which was then increased to BID which did not improve his nocturia.   His records from [facility] are as follows: 09/07/2017: Biopsy revealed Gleason 4+3=7 in 6/12 cores 3+3=6 in 3/12.   09/28/2017: CT scan and bone scan showed no evidence of metastatic disease. He had mild to moderate LUTS.   11/02/2017: no issues after after firmagon. He is scheduled for brachytherapy on 4/29. He gets severe hot flashes 3-4x per day   01/31/2018: s/p brachytherapy and Space OAR on 4/29. He notes worsening urgency, frequency and nocturia since therapy   06/07/2018: PSA is undetectable. ADT initiated 09/2017.   12/20/2018: PSa remains undetectable. Energy good. No New LUTS. He is due for lupron 41m   06/20/2019: No recent PSA. no hot flashes     PMH: Past Medical History:  Diagnosis Date  . Elevated PSA   . HLD (hyperlipidemia)    side effect from Tamsulosin  . Left knee pain   . Low back pain   . Pre-diabetes   . Prostate cancer [place]   . Rotator cuff disorder, right     Surgical History: Past Surgical History:  Procedure Laterality Date  . COLONOSCOPY    . PROSTATE BIOPSY    . PROSTATE BIOPSY    . RADIOACTIVE SEED IMPLANT N/A 01/02/2018   Procedure: RADIOACTIVE SEED IMPLANT/BRACHYTHERAPY IMPLANT;  Surgeon: MCleon Gustin MD;  [facility]  Service: Urology;  Laterality: N/A;  . SHOULDER ARTHROSCOPY WITH ROTATOR CUFF REPAIR AND SUBACROMIAL DECOMPRESSION Right 08/05/2017   Procedure: RIGHT SHOULDER  ARTHROSCOPY WITH ROTATOR CUFF REPAIR, DISTAL CLAVICLE EXCISION, DEBRIDEMENT AND SUBACROMIAL DECOMPRESSION;  Surgeon: XLeandrew Koyanagi MD;  [facility] [facility]  Service: Orthopedics;  Laterality: Right;  . SPACE OAR INSTILLATION N/A 01/02/2018   Procedure: SPACE OAR INSTILLATION;  Surgeon: MCleon Gustin MD;  [facility]  Service: Urology;  Laterality: N/A;    Home Medications:  Allergies as of 01/21/2020      Reactions   Aspirin    States he can take if he takes acid reducer medicine first      Medication List       Accurate as of Jan 21, 2020  3:29 PM. If you have any questions, ask your nurse or doctor.        atorvastatin 10 MG tablet Commonly known as: LIPITOR Take 1 tablet (10 mg total) by mouth daily.   diclofenac Sodium 1 % Gel Commonly known as: Voltaren Apply 2 g topically 4 (four) times daily.   metFORMIN 500 MG tablet Commonly known as: GLUCOPHAGE Take 0.5 tablets (250 mg total) by mouth daily with breakfast.   SNila [id number] Tabs Generic drug: Sodium Sulfate-Mag Sulfate-KCl Take 1 kit by mouth as directed. BIN: [id number]: CN GROUP: [id number] ID: [id number];VJDAS CASH   tamsulosin 0.4 MG Caps capsule Commonly known as: FLOMAX Take 2 capsules (0.8 mg total) by mouth daily.       Allergies:  Allergies  Allergen Reactions  . Aspirin  States he can take if he takes acid reducer medicine first    Family History: History reviewed. No pertinent family history.  Social History:  reports that he has never smoked. He has never used smokeless tobacco. He reports previous alcohol use. He reports that he does not use drugs.  ROS: All other review of systems were reviewed and are negative except what is noted above in HPI  Physical Exam: BP (!) 143/86   Pulse 83   Temp (!) 96.3 F (35.7 C)   Ht 6' 1"  (1.854 m)   Wt 268 lb (121.6 kg)   BMI 35.36 kg/m   Constitutional:  Alert and oriented, No  acute distress. HEENT: [place] AT, moist mucus membranes.  Trachea midline, no masses. Cardiovascular: No clubbing, cyanosis, or edema. Respiratory: Normal respiratory effort, no increased work of breathing. GI: Abdomen is soft, nontender, nondistended, no abdominal masses GU: No CVA tenderness.  Lymph: No cervical or inguinal lymphadenopathy. Skin: No rashes, bruises or suspicious lesions. Neurologic: Grossly intact, no focal deficits, moving all 4 extremities. Psychiatric: Normal mood and affect.  Laboratory Data: Lab Results  Component Value Date   WBC 5.1 11/12/2019   HGB 12.3 (L) 11/12/2019   HCT 36.3 (L) 11/12/2019   MCV 81 11/12/2019   PLT 217 11/12/2019    Lab Results  Component Value Date   CREATININE 1.07 12/07/2019    No results found for: PSA  No results found for: TESTOSTERONE  Lab Results  Component Value Date   HGBA1C 6.5 (H) 11/12/2019    Urinalysis    Component Value Date/Time   COLORURINE STRAW (A) 02/24/2018 1611   APPEARANCEUR CLEAR 02/24/2018 1611   LABSPEC 1.004 (L) 02/24/2018 1611   PHURINE 7.0 02/24/2018 1611   GLUCOSEU NEGATIVE 02/24/2018 1611   HGBUR SMALL (A) 02/24/2018 1611   BILIRUBINUR neg 01/21/2020 1430   KETONESUR NEGATIVE 02/24/2018 1611   PROTEINUR Negative 01/21/2020 1430   PROTEINUR NEGATIVE 02/24/2018 1611   UROBILINOGEN negative (A) 01/21/2020 1430   NITRITE neg 01/21/2020 1430   NITRITE NEGATIVE 02/24/2018 1611   LEUKOCYTESUR Negative 01/21/2020 1430    Lab Results  Component Value Date   BACTERIA NONE SEEN 02/24/2018    Pertinent Imaging:  No results found for this or any previous visit. No results found for this or any previous visit. No results found for this or any previous visit. No results found for this or any previous visit. No results found for this or any previous visit. No results found for this or any previous visit. No results found for this or any previous visit. No results found for this or any  previous visit.  Assessment & Plan:    1. Malignant neoplasm of prostate ([place]) -RTC 6 months with PSA - POCT urinalysis dipstick  2. Benign prostatic hyperplasia with urinary obstruction -uroxatral 29m qhs, stop flomax  3. Weak urinary stream uroxatral 152m stop flomax   No follow-ups on file.  PaNicolette BangMD  [facility] [place]

## 2020-01-21 NOTE — Patient Instructions (Signed)
Prostate Cancer  The prostate is a male gland that helps make semen. Prostate cancer is when abnormal cells grow in this gland. Follow these instructions at home:  Take over-the-counter and prescription medicines only as told by your doctor.  Eat a healthy diet.  Get plenty of sleep.  Ask your doctor for help to find a support group for men with prostate cancer.  Keep all follow-up visits as told by your doctor. This is important.  If you have to go to the hospital, let your cancer doctor (oncologist) know.  Touch, hold, hug, and caress your partner to continue to show sexual feelings. Contact a doctor if:  You have trouble peeing (urinating).  You have blood in your pee (urine).  You have pain in your hips, back, or chest. Get help right away if:  You have weakness in your legs.  You lose feeling (have numbness) in your legs.  You cannot control your pee or your poop (stool).  You have trouble breathing.  You have sudden pain in your chest.  You have chills or a fever. Summary  The prostate is a male gland that helps make semen. Prostate cancer is when abnormal cells grow in this gland.  Ask your doctor for help to find a support group for men with prostate cancer.  Contact a doctor if you have problems peeing or have any new pain that you did not have before. This information is not intended to replace advice given to you by your health care provider. Make sure you discuss any questions you have with your health care provider. Document Revised: 08/05/2017 Document Reviewed: 05/03/2016 Elsevier Patient Education  2020 [employer]  

## 2020-01-24 MED FILL — ?ATORVASTATIN CALCIUM 10MG: 10 | 30 days supply | Qty: 30 | Fill #4

## 2020-02-05 ENCOUNTER — Other Ambulatory Visit: Payer: Self-pay

## 2020-02-05 ENCOUNTER — Encounter: Payer: Self-pay | Admitting: Internal Medicine

## 2020-02-05 MED ORDER — SUTAB 1479-225-188 MG PO TABS: 1.0000 | ORAL_TABLET | ORAL | 0 refills | Status: DC

## 2020-02-06 MED FILL — SUTAB 1479-225-188 MG TABS: 1479-225-18 | 1 days supply | Qty: 24 | Fill #0

## 2020-02-08 ENCOUNTER — Ambulatory Visit (INDEPENDENT_AMBULATORY_CARE_PROVIDER_SITE_OTHER): Payer: Self-pay

## 2020-02-08 ENCOUNTER — Other Ambulatory Visit: Payer: Self-pay | Admitting: Gastroenterology

## 2020-02-08 DIAGNOSIS — Z1159 Encounter for screening for other viral diseases: Secondary | ICD-10-CM

## 2020-02-08 LAB — SARS CORONAVIRUS 2 (TAT 6-24 HRS): SARS Coronavirus 2: NEGATIVE

## 2020-02-12 ENCOUNTER — Encounter: Payer: Self-pay | Admitting: Gastroenterology

## 2020-02-12 ENCOUNTER — Other Ambulatory Visit: Payer: Self-pay

## 2020-02-12 ENCOUNTER — Ambulatory Visit (AMBULATORY_SURGERY_CENTER): Payer: Self-pay | Admitting: Gastroenterology

## 2020-02-12 VITALS — BP 118/77 | HR 70 | Temp 96.6°F | Resp 16 | Ht 72.0 in | Wt 268.0 lb

## 2020-02-12 DIAGNOSIS — Z1211 Encounter for screening for malignant neoplasm of colon: Secondary | ICD-10-CM

## 2020-02-12 DIAGNOSIS — D123 Benign neoplasm of transverse colon: Secondary | ICD-10-CM

## 2020-02-12 DIAGNOSIS — D649 Anemia, unspecified: Secondary | ICD-10-CM

## 2020-02-12 DIAGNOSIS — D122 Benign neoplasm of ascending colon: Secondary | ICD-10-CM

## 2020-02-12 DIAGNOSIS — K635 Polyp of colon: Secondary | ICD-10-CM

## 2020-02-12 MED ORDER — SODIUM CHLORIDE 0.9 % IV SOLN: 500.0000 mL | Freq: Once | INTRAVENOUS | Status: DC

## 2020-02-12 NOTE — Op Note (Signed)
[facility] Patient Name: Roy Reed Procedure Date: 02/12/2020 12:02 PM MRN: [id number] Endoscopist: Justice Britain , MD Age: 64 Referring MD:  Date of Birth: 23-Jul-1956 Gender: Male Account #: [id number] Procedure:                Colonoscopy Indications:              Screening for colorectal malignant neoplasm, This                            is the patient's first colonoscopy, Incidental                            Normocytic Anemia without Iron deficiency Medicines:                Monitored Anesthesia Care Procedure:                Pre-Anesthesia Assessment:                           - Prior to the procedure, a History and Physical                            was performed, and patient medications and                            allergies were reviewed. The patient's tolerance of                            previous anesthesia was also reviewed. The risks                            and benefits of the procedure and the sedation                            options and risks were discussed with the patient.                            All questions were answered, and informed consent                            was obtained. Prior Anticoagulants: The patient has                            taken no previous anticoagulant or antiplatelet                            agents. ASA Grade Assessment: III - A patient with                            severe systemic disease. After reviewing the risks                            and benefits, the patient was deemed in  satisfactory condition to undergo the procedure.                           After obtaining informed consent, the colonoscope                            was passed under direct vision. Throughout the                            procedure, the patient's blood pressure, pulse, and                            oxygen saturations were monitored continuously. The                            Colonoscope  was introduced through the anus and                            advanced to the the cecum, identified by                            appendiceal orifice and ileocecal valve. The                            colonoscopy was performed without difficulty. The                            patient tolerated the procedure. The quality of the                            bowel preparation was adequate. The ileocecal                            valve, appendiceal orifice, and rectum were                            photographed. Scope In: 12:09:50 PM Scope Out: 12:32:31 PM Scope Withdrawal Time: 0 hours 17 minutes 16 seconds  Total Procedure Duration: 0 hours 22 minutes 41 seconds  Findings:                 Skin tags were found on perianal exam.                           The digital rectal exam findings include                            hemorrhoids. Pertinent negatives include no                            palpable rectal lesions.                           Five sessile polyps were found in the transverse  colon (2), hepatic flexure (1) and ascending colon                            (20. The polyps were 2 to 6 mm in size. These                            polyps were removed with a cold snare. Resection                            and retrieval were complete.                           Normal mucosa was found in the entire colon                            otherwise.                           Non-bleeding non-thrombosed internal hemorrhoids                            were found during retroflexion, during perianal                            exam and during digital exam. The hemorrhoids were                            Grade II (internal hemorrhoids that prolapse but                            reduce spontaneously). Complications:            No immediate complications. Estimated Blood Loss:     Estimated blood loss was minimal. Impression:               - Perianal skin tags found on  perianal exam.                           - Hemorrhoids found on digital rectal exam.                           - Five 2 to 6 mm polyps in the transverse colon, at                            the hepatic flexure and in the ascending colon,                            removed with a cold snare. Resected and retrieved.                           - Normal mucosa in the entire examined colon.                           - Non-bleeding non-thrombosed internal hemorrhoids. Recommendation:           -  The patient will be observed post-procedure,                            until all discharge criteria are met.                           - Discharge patient to home.                           - Patient has a contact number available for                            emergencies. The signs and symptoms of potential                            delayed complications were discussed with the                            patient. Return to normal activities tomorrow.                            Written discharge instructions were provided to the                            patient.                           - High fiber diet.                           - Use FiberCon 1-2 tablets PO daily.                           - Continue present medications.                           - Await pathology results.                           - Repeat colonoscopy in 3 years for surveillance                            and findings of adenomatous tissue.                           - Further workup of your normocytic anemia with                            your Primary Care Provider and/or Hematology.                           - The findings and recommendations were discussed                            with the patient. Justice Britain, MD 02/12/2020 12:38:54 PM

## 2020-02-12 NOTE — Progress Notes (Signed)
Called to room to assist during endoscopic procedure.  Patient ID and intended procedure confirmed with present staff. Received instructions for my participation in the procedure from the performing physician.  

## 2020-02-12 NOTE — Progress Notes (Signed)
VO Lidocaine 2% 97mL given with propofol for patient comfort.

## 2020-02-12 NOTE — Patient Instructions (Signed)
Please read handouts provided. ?Continue present medications. ?Await pathology results. ?High Fiber Diet. ?Use FiberCon 1-2 tablets daily. ? ? ?YOU HAD AN ENDOSCOPIC PROCEDURE TODAY AT THE [facility]:   Refer to the procedure report that was given to you for any specific questions about what was found during the examination.  If the procedure report does not answer your questions, please call your gastroenterologist to clarify.  If you requested that your care partner not be given the details of your procedure findings, then the procedure report has been included in a sealed envelope for you to review at your convenience later. ? ?YOU SHOULD EXPECT: Some feelings of bloating in the abdomen. Passage of more gas than usual.  Walking can help get rid of the air that was put into your GI tract during the procedure and reduce the bloating. If you had a lower endoscopy (such as a colonoscopy or flexible sigmoidoscopy) you may notice spotting of blood in your stool or on the toilet paper. If you underwent a bowel prep for your procedure, you may not have a normal bowel movement for a few days. ? ?Please Note:  You might notice some irritation and congestion in your nose or some drainage.  This is from the oxygen used during your procedure.  There is no need for concern and it should clear up in a day or so. ? ?SYMPTOMS TO REPORT IMMEDIATELY: ? ?Following lower endoscopy (colonoscopy or flexible sigmoidoscopy): ? Excessive amounts of blood in the stool ? Significant tenderness or worsening of abdominal pains ? Swelling of the abdomen that is new, acute ? Fever of 100?F or higher ? ? ?For urgent or emergent issues, a gastroenterologist can be reached at any hour by calling [phone]. ?Do not use MyChart messaging for urgent concerns.  ? ? ?DIET:  We do recommend a small meal at first, but then you may proceed to your regular diet.  Drink plenty of fluids but you should avoid alcoholic beverages for 24  hours. ? ?ACTIVITY:  You should plan to take it easy for the rest of today and you should NOT DRIVE or use heavy machinery until tomorrow (because of the sedation medicines used during the test).   ? ?FOLLOW UP: ?Our staff will call the number listed on your records 48-72 hours following your procedure to check on you and address any questions or concerns that you may have regarding the information given to you following your procedure. If we do not reach you, we will leave a message.  We will attempt to reach you two times.  During this call, we will ask if you have developed any symptoms of COVID 19. If you develop any symptoms (ie: fever, flu-like symptoms, shortness of breath, cough etc.) before then, please call [phone].  If you test positive for Covid 19 in the 2 weeks post procedure, please call and report this information to us.   ? ?If any biopsies were taken you will be contacted by phone or by letter within the next 1-3 weeks.  Please call us at [phone] if you have not heard about the biopsies in 3 weeks.  ? ? ?SIGNATURES/CONFIDENTIALITY: ?You and/or your care partner have signed paperwork which will be entered into your electronic medical record.  These signatures attest to the fact that that the information above on your After Visit Summary has been reviewed and is understood.  Full responsibility of the confidentiality of this discharge information lies with you and/or your   care-partner.  ?

## 2020-02-12 NOTE — Progress Notes (Signed)
pt tolerated well. VSS. awake and to recovery. Report given to RN.  

## 2020-02-14 ENCOUNTER — Telehealth: Payer: Self-pay | Admitting: *Deleted

## 2020-02-14 NOTE — Telephone Encounter (Signed)
  Follow up Call-  Call back number 02/12/2020  Post procedure Call Back phone  # Judson Roch (daughter) [phone]  Permission to leave phone message Yes  Some recent data might be hidden     Patient questions:  Do you have a fever, pain , or abdominal swelling? No. Pain Score  0 *  Have you tolerated food without any problems? Yes.    Have you been able to return to your normal activities? Yes.    Do you have any questions about your discharge instructions: Diet   No. Medications  No. Follow up visit  No.  Do you have questions or concerns about your Care? No.  Actions: * If pain score is 4 or above: No action needed, pain <4.  1. Have you developed a fever since your procedure? no  2.   Have you had an respiratory symptoms (SOB or cough) since your procedure? no  3.   Have you tested positive for COVID 19 since your procedure no  4.   Have you had any family members/close contacts diagnosed with the COVID 19 since your procedure?  no   If yes to any of these questions please route to Joylene John, RN and Erenest Rasher, RN

## 2020-02-15 ENCOUNTER — Telehealth: Payer: Self-pay | Admitting: Cardiology

## 2020-02-15 ENCOUNTER — Encounter: Payer: Self-pay | Admitting: Gastroenterology

## 2020-02-15 NOTE — Telephone Encounter (Signed)
I attempted to call patient 02/15/20 to schedule follow up visit from patients recall list. The patient didn't answer so I left message for patient to return call to get that appointment scheduled.

## 2020-02-21 ENCOUNTER — Telehealth: Payer: Self-pay

## 2020-02-21 DIAGNOSIS — N138 Other obstructive and reflux uropathy: Secondary | ICD-10-CM

## 2020-02-22 MED FILL — ?ATORVASTATIN 10 MG TABLET: 10 | 30 days supply | Qty: 30 | Fill #5

## 2020-02-24 NOTE — Progress Notes (Signed)
Cardiology Office Note:    Date:  02/27/2020   ID:  Roy Reed, DOB 1956-01-14, MRN [id number]  PCP:  Ladell Pier, MD  Cardiologist:  Donato Heinz, MD  Electrophysiologist:  None   Referring MD: Ladell Pier, MD   Chief Complaint  Patient presents with  . Chest Pain    History of Present Illness:    Roy Reed is a 64 y.o. male with a hx of prostate cancer, hyperlipidemia, diabetes who presents for follow-up.  He was referred by Dr. Wynetta Emery for evaluation of chest pain, initially seen on 12/07/2019.  He reports that over the last 3 months he has had pain on the left side of his chest.  Describes as sharp pain.  Reports pain has been constant over the last 3 months, but is worse with certain movements.  Does not worsen with deep breathing.  States that he used to play tennis and had shoulder issues and was doing physical therapy.  Reports pain started around the time he was doing his therapy.  Reports that he gets short of breath with minimal exertion.  No smoking history.  No history of heart disease in his immediate family.  Coronary CTA on 01/07/2020 showed calcium score 8 (49th percentile), minimal calcified plaque in mid LAD causing 0 to 24% stenosis.  Since last clinic visit, he reports that he has been doing well.  States that his chest pain has resolved.  He has been playing tennis 2-3 times per week.  He denies any chest pain.  Occasional shortness of breath.  Past Medical History:  Diagnosis Date  . Elevated PSA   . HLD (hyperlipidemia)    side effect from Tamsulosin  . Left knee pain   . Low back pain   . Pre-diabetes   . Prostate cancer ([place])   . Rotator cuff disorder, right     Past Surgical History:  Procedure Laterality Date  . COLONOSCOPY    . PROSTATE BIOPSY    . PROSTATE BIOPSY    . RADIOACTIVE SEED IMPLANT N/A 01/02/2018   Procedure: RADIOACTIVE SEED IMPLANT/BRACHYTHERAPY IMPLANT;  Surgeon: Cleon Gustin, MD;  Location:  [facility];  Service: Urology;  Laterality: N/A;  . SHOULDER ARTHROSCOPY WITH ROTATOR CUFF REPAIR AND SUBACROMIAL DECOMPRESSION Right 08/05/2017   Procedure: RIGHT SHOULDER ARTHROSCOPY WITH ROTATOR CUFF REPAIR, DISTAL CLAVICLE EXCISION, DEBRIDEMENT AND SUBACROMIAL DECOMPRESSION;  Surgeon: Leandrew Koyanagi, MD;  Location: [facility];  Service: Orthopedics;  Laterality: Right;  . SPACE OAR INSTILLATION N/A 01/02/2018   Procedure: SPACE OAR INSTILLATION;  Surgeon: Cleon Gustin, MD;  Location: [facility];  Service: Urology;  Laterality: N/A;    Current Medications: Current Meds  Medication Sig  . alfuzosin (UROXATRAL) 10 MG 24 hr tablet Take 1 tablet (10 mg total) by mouth daily with breakfast.  . atorvastatin (LIPITOR) 10 MG tablet Take 1 tablet (10 mg total) by mouth daily.  . diclofenac Sodium (VOLTAREN) 1 % GEL Apply 2 g topically 4 (four) times daily.  . [DISCONTINUED] metFORMIN (GLUCOPHAGE) 500 MG tablet Take 0.5 tablets (250 mg total) by mouth daily with breakfast.  . [DISCONTINUED] tamsulosin (FLOMAX) 0.4 MG CAPS capsule Take 2 capsules (0.8 mg total) by mouth daily.     Allergies:   Aspirin   Social History   Socioeconomic History  . Marital status: Married    Spouse name: Not on file  . Number of children: 6  . Years of education: Not  on file  . Highest education level: Not on file  Occupational History  . Occupation: retired  Tobacco Use  . Smoking status: Never Smoker  . Smokeless tobacco: Never Used  Vaping Use  . Vaping Use: Never used  Substance and Sexual Activity  . Alcohol use: Not Currently  . Drug use: No  . Sexual activity: Not on file  Other Topics Concern  . Not on file  Social History Narrative  . Not on file   Social Determinants of Health   Financial Resource Strain:   . Difficulty of Paying Living Expenses:   Food Insecurity:   . Worried About Charity fundraiser in the Last Year:   . Academic librarian in the Last Year:   Transportation Needs:   . Film/video editor (Medical):   Marland Kitchen Lack of Transportation (Non-Medical):   Physical Activity:   . Days of Exercise per Week:   . Minutes of Exercise per Session:   Stress:   . Feeling of Stress :   Social Connections:   . Frequency of Communication with Friends and Family:   . Frequency of Social Gatherings with Friends and Family:   . Attends Religious Services:   . Active Member of Clubs or Organizations:   . Attends Archivist Meetings:   Marland Kitchen Marital Status:      Family History: The patient's family history is not on file.  ROS:   Please see the history of present illness.    All other systems reviewed and are negative.  EKGs/Labs/Other Studies Reviewed:    The following studies were reviewed today:   EKG:  EKG is not ordered today.  The ekg ordered most recently demonstrates normal sinus rhythm, rate 94, no ST/T abnormalities  Recent Labs: 11/12/2019: ALT 57; Hemoglobin 12.3; Platelets 217 12/07/2019: BUN 15; Creatinine, Ser 1.07; Potassium 4.8; Sodium 139  Recent Lipid Panel    Component Value Date/Time   CHOL 150 11/12/2019 1706   TRIG 74 11/12/2019 1706   HDL 52 11/12/2019 1706   CHOLHDL 2.9 11/12/2019 1706   LDLCALC 84 11/12/2019 1706    Physical Exam:    VS:  BP 130/85   Pulse 98   Temp (!) 96.9 F (36.1 C)   Ht 6\' 1"  (1.854 m)   Wt 269 lb 3.2 oz (122.1 kg)   SpO2 96%   BMI 35.52 kg/m     Wt Readings from Last 3 Encounters:  02/25/20 269 lb 3.2 oz (122.1 kg)  02/12/20 268 lb (121.6 kg)  01/21/20 268 lb (121.6 kg)     LHT:DSKA nourished, well developed in no acute distress HEENT: Normal NECK: No JVD CARDIAC: RRR, no murmurs, rubs, gallops RESPIRATORY:  Clear to auscultation without rales, wheezing or rhonchi  ABDOMEN: Soft, non-tender, non-distended MUSCULOSKELETAL:  No edema; No deformity  SKIN: Warm and dry NEUROLOGIC:  Alert and oriented x 3 PSYCHIATRIC:  Normal affect    ASSESSMENT:    1. Coronary artery disease involving native coronary artery of native heart without angina pectoris   2. Hyperlipidemia, unspecified hyperlipidemia type    PLAN:     CAD:  Presented with atypical chest pain.  Coronary CTA on 01/07/2020 showed calcium score 8 (49th percentile), minimal calcified plaque in mid LAD causing 0 to 24% stenosis.  Reports chest pain has resolved.  Hyperlipidemia: On atorvastatin 10 mg daily.  LDL 84 on 11/12/19  Type 2 diabetes: Recent A1c 6.5.  On Metformin  RTC in 1  year  Medication Adjustments/Labs and Tests Ordered: Current medicines are reviewed at length with the patient today.  Concerns regarding medicines are outlined above.  No orders of the defined types were placed in this encounter.  No orders of the defined types were placed in this encounter.   Patient Instructions  Medication Instructions:  Your physician recommends that you continue on your current medications as directed. Please refer to the Current Medication list given to you today.  *If you need a refill on your cardiac medications before your next appointment, please call your pharmacy*  Follow-Up: At [facility], you and your health needs are our priority.  As part of our continuing mission to provide you with exceptional heart care, we have created designated Provider Care Teams.  These Care Teams include your primary Cardiologist (physician) and Advanced Practice Providers (APPs -  Physician Assistants and Nurse Practitioners) who all work together to provide you with the care you need, when you need it.  We recommend signing up for the patient portal called "MyChart".  Sign up information is provided on this After Visit Summary.  MyChart is used to connect with patients for Virtual Visits (Telemedicine).  Patients are able to view lab/test results, encounter notes, upcoming appointments, etc.  Non-urgent messages can be sent to your provider as well.   To learn more  about what you can do with MyChart, go to [email or web address].    Your next appointment:   12 month(s)  The format for your next appointment:   In Person  Provider:   Oswaldo Milian, MD         Signed, Donato Heinz, MD  02/27/2020 6:07 PM    [place]

## 2020-02-25 ENCOUNTER — Other Ambulatory Visit: Payer: Self-pay

## 2020-02-25 ENCOUNTER — Ambulatory Visit (INDEPENDENT_AMBULATORY_CARE_PROVIDER_SITE_OTHER): Payer: Self-pay | Admitting: Cardiology

## 2020-02-25 ENCOUNTER — Encounter: Payer: Self-pay | Admitting: Cardiology

## 2020-02-25 VITALS — BP 130/85 | HR 98 | Temp 96.9°F | Ht 73.0 in | Wt 269.2 lb

## 2020-02-25 DIAGNOSIS — I2510 Atherosclerotic heart disease of native coronary artery without angina pectoris: Secondary | ICD-10-CM

## 2020-02-25 DIAGNOSIS — E785 Hyperlipidemia, unspecified: Secondary | ICD-10-CM

## 2020-02-25 NOTE — Patient Instructions (Signed)

## 2020-02-28 MED ORDER — SILODOSIN 8 MG PO CAPS: 8.0000 mg | ORAL_CAPSULE | Freq: Every day | ORAL | 0 refills | Status: DC

## 2020-02-28 NOTE — Telephone Encounter (Signed)
Please send rapaflo 8mg  daily #30 0 refills

## 2020-02-28 NOTE — Telephone Encounter (Signed)
Called pt and made aware new med sent for him to try.

## 2020-02-29 MED FILL — SILODOSIN 8 MG CAPS: 8 | 30 days supply | Qty: 30 | Fill #0

## 2020-03-04 ENCOUNTER — Ambulatory Visit: Payer: Self-pay | Attending: Internal Medicine | Admitting: Internal Medicine

## 2020-03-04 ENCOUNTER — Other Ambulatory Visit: Payer: Self-pay

## 2020-03-04 DIAGNOSIS — D126 Benign neoplasm of colon, unspecified: Secondary | ICD-10-CM

## 2020-03-04 DIAGNOSIS — R7303 Prediabetes: Secondary | ICD-10-CM

## 2020-03-04 DIAGNOSIS — I2510 Atherosclerotic heart disease of native coronary artery without angina pectoris: Secondary | ICD-10-CM

## 2020-03-04 DIAGNOSIS — R600 Localized edema: Secondary | ICD-10-CM

## 2020-03-04 NOTE — Progress Notes (Signed)
Virtual Visit via Telephone Note Due to current restrictions/limitations of in-office visits due to the COVID-19 pandemic, this scheduled clinical appointment was converted to a telehealth visit  I connected with Roy Reed on 03/04/20 at 12:24 p.m by telephone and verified that I am speaking with the correct person using two identifiers. I am in my office.  The patient is at home.  Only the patient, myself and  Chole from [facility] interpreters [phone]) participated in this encounter.  I discussed the limitations, risks, security and privacy concerns of performing an evaluation and management service by telephone and the availability of in person appointments. I also discussed with the patient that there may be a patient responsible charge related to this service. The patient expressed understanding and agreed to proceed.   History of Present Illness: Pt with hx of pre-DM,obesity, HL,rotator cuff tear s/p RT shoulder arthroscopy andprostate CA (hormone and radiation seeds), obesity.  Saw cardiology since last visit. Had cardiac CT.  This revealed him score of 8 which is in the 49th percentile, minimal calcified plaque in the mid LAD causing 0 to 24% stenosis.  He recommended continue atorvastatin. Pt requesting recheck for cholesterol.  Had lipid profile 11/2019 which was good  Anemia:  Iron studies c/w ACD.  Had c-scope.  5 tubular adenomatous polys removed. Will need repeat in 3 yrs  PreDM:  Doing okay with eating habits. Took Metformin for only 2 days.  Did not understand that he was to take it long term.  C/o mild swelling in LT foot when sitting x 2 mths.  No pain.  Better when moving around.    Outpatient Encounter Medications as of 03/04/2020  Medication Sig   alfuzosin (UROXATRAL) 10 MG 24 hr tablet Take 1 tablet (10 mg total) by mouth daily with breakfast.   atorvastatin (LIPITOR) 10 MG tablet Take 1 tablet (10 mg total) by mouth daily.   silodosin (RAPAFLO) 8 MG CAPS  capsule Take 1 capsule (8 mg total) by mouth daily with breakfast.   diclofenac Sodium (VOLTAREN) 1 % GEL Apply 2 g topically 4 (four) times daily. (Patient not taking: Reported on 03/04/2020)   No facility-administered encounter medications on file as of 03/04/2020.      Observations/Objective: Results for orders placed or performed in visit on 02/08/20  SARS Coronavirus 2 (TAT 6-24 hrs)  Result Value Ref Range   SARS Coronavirus 2 RESULT: NEGATIVE      Assessment and Plan: 1. Prediabetes We will recheck A1c to see whether it has improved without him taking Metformin.  If it has an or has worsened, we will have him get back on the Metformin.  Healthy eating habits encouraged. - Hemoglobin A1c; Future  2. Coronary artery disease involving native coronary artery of native heart without angina pectoris Coronary CT with nonobstructive disease.  3. Tubulovillous adenoma polyp of colon We will need repeat colonoscopy in 3 years  4. Edema of left foot Sounds like dependent edema.  I recommend wearing compression socks when he anticipates prolonged sitting.  Follow Up Instructions: Follow-up again in 4 months.   I discussed the assessment and treatment plan with the patient. The patient was provided an opportunity to ask questions and all were answered. The patient agreed with the plan and demonstrated an understanding of the instructions.   The patient was advised to call back or seek an in-person evaluation if the symptoms worsen or if the condition fails to improve as anticipated.  I provided 18 minutes of non-face-to-face  time during this encounter.   Karle Plumber, MD

## 2020-03-04 NOTE — Progress Notes (Signed)
Pt would like recheck DM and cholesterol blood work  C/o L foot swelling X 2 months

## 2020-03-06 NOTE — Addendum Note (Signed)
Addended bySteffanie Dunn on: 03/06/2020 09:20 AM   Modules accepted: Orders

## 2020-03-07 ENCOUNTER — Other Ambulatory Visit: Payer: Self-pay | Admitting: Internal Medicine

## 2020-03-07 ENCOUNTER — Encounter: Payer: Self-pay | Admitting: Internal Medicine

## 2020-03-07 LAB — HEMOGLOBIN A1C
Est. average glucose Bld gHb Est-mCnc: 131 mg/dL
Hgb A1c MFr Bld: 6.2 % — ABNORMAL HIGH (ref 4.8–5.6)

## 2020-03-07 MED ORDER — METFORMIN HCL 500 MG PO TABS: 250.0000 mg | ORAL_TABLET | Freq: Every day | ORAL | 3 refills | Status: DC

## 2020-03-11 MED FILL — METFORMIN HCL 500 MG TABS: 500 | 30 days supply | Qty: 15 | Fill #0

## 2020-03-17 ENCOUNTER — Other Ambulatory Visit: Payer: Self-pay

## 2020-03-17 ENCOUNTER — Ambulatory Visit (INDEPENDENT_AMBULATORY_CARE_PROVIDER_SITE_OTHER): Payer: Self-pay | Admitting: Urology

## 2020-03-17 ENCOUNTER — Other Ambulatory Visit: Payer: Self-pay | Admitting: Urology

## 2020-03-17 ENCOUNTER — Encounter: Payer: Self-pay | Admitting: Urology

## 2020-03-17 VITALS — BP 107/69 | HR 91 | Temp 97.8°F | Ht 73.0 in | Wt 256.0 lb

## 2020-03-17 DIAGNOSIS — N401 Enlarged prostate with lower urinary tract symptoms: Secondary | ICD-10-CM

## 2020-03-17 DIAGNOSIS — N138 Other obstructive and reflux uropathy: Secondary | ICD-10-CM

## 2020-03-17 DIAGNOSIS — R3912 Poor urinary stream: Secondary | ICD-10-CM

## 2020-03-17 MED ORDER — HYDROCORTISONE 1 % EX OINT
1.0000 | TOPICAL_OINTMENT | Freq: Two times a day (BID) | CUTANEOUS | 0 refills | Status: DC
Start: 2020-03-17 — End: 2020-08-06

## 2020-03-17 MED ORDER — TADALAFIL 20 MG PO TABS
20.0000 mg | ORAL_TABLET | Freq: Every day | ORAL | 0 refills | Status: DC | PRN
Start: 2020-03-17 — End: 2020-08-06

## 2020-03-17 MED ORDER — SILODOSIN 8 MG PO CAPS: 8.0000 mg | ORAL_CAPSULE | Freq: Every day | ORAL | 3 refills | Status: DC

## 2020-03-17 NOTE — Progress Notes (Signed)
03/17/2020 4:02 PM   Mohanad Ballin [birth year] [id number]  Referring provider: Ladell Pier, MD [place],  [place] [address]  Weak urinary stream  HPI: Mr Hale is a 64yo here for followup for BPH with weak urinary stream. He was started on uroxatral 10mg  qhs last visit which failed to improve his stream and his nocturia. He was then switched to Rapaflo 8mg  which improved his urinary stream and decreased nocturia to 3x. He has been on the medication for 2 weeks.    PMH: Past Medical History:  Diagnosis Date   Elevated PSA    HLD (hyperlipidemia)    side effect from Tamsulosin   Left knee pain    Low back pain    Pre-diabetes    Prostate cancer ([place])    Rotator cuff disorder, right     Surgical History: Past Surgical History:  Procedure Laterality Date   COLONOSCOPY     PROSTATE BIOPSY     PROSTATE BIOPSY     RADIOACTIVE SEED IMPLANT N/A 01/02/2018   Procedure: RADIOACTIVE SEED IMPLANT/BRACHYTHERAPY IMPLANT;  Surgeon: Cleon Gustin, MD;  Location: [facility];  Service: Urology;  Laterality: N/A;   SHOULDER ARTHROSCOPY WITH ROTATOR CUFF REPAIR AND SUBACROMIAL DECOMPRESSION Right 08/05/2017   Procedure: RIGHT SHOULDER ARTHROSCOPY WITH ROTATOR CUFF REPAIR, DISTAL CLAVICLE EXCISION, DEBRIDEMENT AND SUBACROMIAL DECOMPRESSION;  Surgeon: Leandrew Koyanagi, MD;  Location: [place];  Service: Orthopedics;  Laterality: Right;   SPACE OAR INSTILLATION N/A 01/02/2018   Procedure: SPACE OAR INSTILLATION;  Surgeon: Cleon Gustin, MD;  Location: [facility];  Service: Urology;  Laterality: N/A;    Home Medications:  Allergies as of 03/17/2020      Reactions   Aspirin    States he can take if he takes acid reducer medicine first      Medication List       Accurate as of March 17, 2020  4:02 PM. If you have any questions, ask your nurse or doctor.        alfuzosin 10 MG 24 hr  tablet Commonly known as: UROXATRAL Take 1 tablet (10 mg total) by mouth daily with breakfast.   atorvastatin 10 MG tablet Commonly known as: LIPITOR Take 1 tablet (10 mg total) by mouth daily.   diclofenac Sodium 1 % Gel Commonly known as: Voltaren Apply 2 g topically 4 (four) times daily.   metFORMIN 500 MG tablet Commonly known as: GLUCOPHAGE Take 0.5 tablets (250 mg total) by mouth daily with breakfast.   silodosin 8 MG Caps capsule Commonly known as: RAPAFLO Take 1 capsule (8 mg total) by mouth daily with breakfast.       Allergies:  Allergies  Allergen Reactions   Aspirin     States he can take if he takes acid reducer medicine first    Family History: No family history on file.  Social History:  reports that he has never smoked. He has never used smokeless tobacco. He reports previous alcohol use. He reports that he does not use drugs.  ROS: All other review of systems were reviewed and are negative except what is noted above in HPI  Physical Exam: BP 107/69    Pulse 91    Temp 97.8 F (36.6 C)    Ht 6\' 1"  (1.854 m)    Wt 256 lb (116.1 kg)    BMI 33.78 kg/m   Constitutional:  Alert and oriented, No acute distress. HEENT: [place] AT,  moist mucus membranes.  Trachea midline, no masses. Cardiovascular: No clubbing, cyanosis, or edema. Respiratory: Normal respiratory effort, no increased work of breathing. GI: Abdomen is soft, nontender, nondistended, no abdominal masses GU: No CVA tenderness.  Lymph: No cervical or inguinal lymphadenopathy. Skin: No rashes, bruises or suspicious lesions. Neurologic: Grossly intact, no focal deficits, moving all 4 extremities. Psychiatric: Normal mood and affect.  Laboratory Data: Lab Results  Component Value Date   WBC 5.1 11/12/2019   HGB 12.3 (L) 11/12/2019   HCT 36.3 (L) 11/12/2019   MCV 81 11/12/2019   PLT 217 11/12/2019    Lab Results  Component Value Date   CREATININE 1.07 12/07/2019    No results found for:  PSA  No results found for: TESTOSTERONE  Lab Results  Component Value Date   HGBA1C 6.2 (H) 03/06/2020    Urinalysis    Component Value Date/Time   COLORURINE STRAW (A) 02/24/2018 1611   APPEARANCEUR CLEAR 02/24/2018 1611   LABSPEC 1.004 (L) 02/24/2018 1611   PHURINE 7.0 02/24/2018 1611   GLUCOSEU NEGATIVE 02/24/2018 1611   HGBUR SMALL (A) 02/24/2018 1611   BILIRUBINUR neg 01/21/2020 1430   KETONESUR NEGATIVE 02/24/2018 1611   PROTEINUR Negative 01/21/2020 1430   PROTEINUR NEGATIVE 02/24/2018 1611   UROBILINOGEN negative (A) 01/21/2020 1430   NITRITE neg 01/21/2020 1430   NITRITE NEGATIVE 02/24/2018 1611   LEUKOCYTESUR Negative 01/21/2020 1430    Lab Results  Component Value Date   BACTERIA NONE SEEN 02/24/2018    Pertinent Imaging:  No results found for this or any previous visit.  No results found for this or any previous visit.  No results found for this or any previous visit.  No results found for this or any previous visit.  No results found for this or any previous visit.  No results found for this or any previous visit.  No results found for this or any previous visit.  No results found for this or any previous visit.   Assessment & Plan:    1. Benign prostatic hyperplasia with urinary obstruction -Continue rapaflo 8mg    2. Weak urinary stream -continue rapaflo 8mg     No follow-ups on file.  Nicolette Bang, MD  [facility] [place]

## 2020-03-17 NOTE — Patient Instructions (Signed)

## 2020-03-17 NOTE — Progress Notes (Signed)
Urological Symptom Review  Patient is experiencing the following symptoms: Frequent urination Get up at night to urinate   Review of Systems  Gastrointestinal (upper)  : Negative for upper GI symptoms  Gastrointestinal (lower) : Negative for lower GI symptoms  Constitutional : Negative for symptoms  Skin: Skin rash/lesion  Eyes: Negative for eye symptoms  Ear/Nose/Throat : Negative for Ear/Nose/Throat symptoms  Hematologic/Lymphatic: Negative for Hematologic/Lymphatic symptoms  Cardiovascular : Negative for cardiovascular symptoms  Respiratory : Negative for respiratory symptoms  Endocrine: Negative for endocrine symptoms  Musculoskeletal: Negative for musculoskeletal symptoms  Neurological: Negative for neurological symptoms  Psychologic: Negative for psychiatric symptoms

## 2020-03-18 MED FILL — ?TADALAFIL (PAH) 20 MG TAB: 20 | 30 days supply | Qty: 10 | Fill #0

## 2020-03-24 ENCOUNTER — Other Ambulatory Visit: Payer: Self-pay | Admitting: Internal Medicine

## 2020-03-24 MED FILL — ?ATORVASTATIN 10 MG TABLET: 10 | 30 days supply | Qty: 30 | Fill #0

## 2020-03-24 NOTE — Telephone Encounter (Signed)
Requested Prescriptions  Pending Prescriptions Disp Refills  . atorvastatin (LIPITOR) 10 MG tablet [Pharmacy Med Name: ATORVASTATIN 10 MG TABLET 10 Tablet] 90 tablet 2    Sig: TAKE 1 TABLET (10 MG TOTAL) BY MOUTH DAILY.     Cardiovascular:  Antilipid - Statins Failed - 03/24/2020 11:06 AM      Failed - LDL in normal range and within 360 days    LDL Chol Calc (NIH)  Date Value Ref Range Status  11/12/2019 84 0 - 99 mg/dL Final         Passed - Total Cholesterol in normal range and within 360 days    Cholesterol, Total  Date Value Ref Range Status  11/12/2019 150 100 - 199 mg/dL Final         Passed - HDL in normal range and within 360 days    HDL  Date Value Ref Range Status  11/12/2019 52 >39 mg/dL Final         Passed - Triglycerides in normal range and within 360 days    Triglycerides  Date Value Ref Range Status  11/12/2019 74 0 - 149 mg/dL Final         Passed - Patient is not pregnant      Passed - Valid encounter within last 12 months    Recent Outpatient Visits          2 weeks ago Prediabetes   [place], MD   3 months ago Prediabetes   [place], MD   7 months ago Need for Tdap vaccination   [place], Jarome Matin, RPH-CPP   8 months ago Hyperlipidemia, unspecified hyperlipidemia type   [place] Ladell Pier, MD   1 year ago Obesity (BMI 30-39.9)   [place], MD      Future Appointments            In 4 months McKenzie, Candee Furbish, MD [place]

## 2020-03-26 ENCOUNTER — Other Ambulatory Visit: Payer: Self-pay | Admitting: Physician Assistant

## 2020-03-26 ENCOUNTER — Encounter: Payer: Self-pay | Admitting: Internal Medicine

## 2020-03-26 MED ORDER — ATORVASTATIN CALCIUM 10 MG PO TABS: 10.0000 mg | ORAL_TABLET | Freq: Every day | ORAL | 2 refills | Status: DC

## 2020-04-10 MED FILL — METFORMIN HCL 500 MG TABS: 500 | 30 days supply | Qty: 15 | Fill #1

## 2020-04-11 MED FILL — SILODOSIN 8 MG CAPS: 8 | 30 days supply | Qty: 30 | Fill #0

## 2020-04-23 MED FILL — ?ATORVASTATIN 10 MG TABLET: 10 | 30 days supply | Qty: 30 | Fill #1

## 2020-05-07 MED FILL — ?METFORMIN HCL 500MG TABL: 500 | 30 days supply | Qty: 15 | Fill #2

## 2020-05-13 ENCOUNTER — Ambulatory Visit: Payer: Self-pay

## 2020-05-13 MED FILL — SILODOSIN 8 MG CAPS: 8 | 30 days supply | Qty: 30 | Fill #1

## 2020-05-19 ENCOUNTER — Ambulatory Visit: Payer: Self-pay

## 2020-05-19 ENCOUNTER — Other Ambulatory Visit: Payer: Self-pay

## 2020-05-19 ENCOUNTER — Ambulatory Visit: Payer: Self-pay | Attending: Family Medicine

## 2020-05-26 MED FILL — ATORVASTATIN 10 MG TABLET: 10 | 30 days supply | Qty: 30 | Fill #2

## 2020-06-09 MED FILL — ?METFORMIN HCL 500MG TABL: 500 | 30 days supply | Qty: 15 | Fill #3

## 2020-06-13 MED FILL — SILODOSIN 8 MG CAPS: 8 | 30 days supply | Qty: 30 | Fill #2

## 2020-06-24 MED FILL — ?ATORVASTATIN 10 MG TABLET: 10 | 30 days supply | Qty: 30 | Fill #3

## 2020-06-25 ENCOUNTER — Ambulatory Visit: Payer: Self-pay | Attending: Internal Medicine

## 2020-06-25 ENCOUNTER — Other Ambulatory Visit: Payer: Self-pay

## 2020-07-14 MED FILL — METFORMIN HCL 500 MG TABS: 500 | 30 days supply | Qty: 15 | Fill #4

## 2020-07-14 MED FILL — SILODOSIN 8 MG CAPS: 8 | 30 days supply | Qty: 30 | Fill #3

## 2020-07-18 MED FILL — ?ATORVASTATIN 10 MG TABLET: 10 | 30 days supply | Qty: 30 | Fill #4

## 2020-07-23 ENCOUNTER — Ambulatory Visit: Payer: Self-pay | Admitting: Urology

## 2020-07-28 ENCOUNTER — Other Ambulatory Visit: Payer: Self-pay

## 2020-07-28 ENCOUNTER — Other Ambulatory Visit: Payer: Self-pay | Admitting: Urology

## 2020-07-28 DIAGNOSIS — C61 Malignant neoplasm of prostate: Secondary | ICD-10-CM

## 2020-07-29 LAB — PSA: Prostate Specific Ag, Serum: <0.1 ng/mL (ref 0.0–4.0)

## 2020-08-06 ENCOUNTER — Other Ambulatory Visit: Payer: Self-pay

## 2020-08-06 ENCOUNTER — Ambulatory Visit (INDEPENDENT_AMBULATORY_CARE_PROVIDER_SITE_OTHER): Payer: Self-pay | Admitting: Urology

## 2020-08-06 ENCOUNTER — Other Ambulatory Visit: Payer: Self-pay | Admitting: Urology

## 2020-08-06 ENCOUNTER — Encounter: Payer: Self-pay | Admitting: Urology

## 2020-08-06 VITALS — BP 118/75 | HR 74 | Temp 98.6°F | Ht 74.0 in | Wt 268.0 lb

## 2020-08-06 DIAGNOSIS — C61 Malignant neoplasm of prostate: Secondary | ICD-10-CM

## 2020-08-06 DIAGNOSIS — N138 Other obstructive and reflux uropathy: Secondary | ICD-10-CM

## 2020-08-06 DIAGNOSIS — R3912 Poor urinary stream: Secondary | ICD-10-CM

## 2020-08-06 DIAGNOSIS — N401 Enlarged prostate with lower urinary tract symptoms: Secondary | ICD-10-CM

## 2020-08-06 LAB — URINALYSIS, ROUTINE W REFLEX MICROSCOPIC
Bilirubin, UA: NEGATIVE
Glucose, UA: NEGATIVE
Ketones, UA: NEGATIVE
Leukocytes,UA: NEGATIVE
Nitrite, UA: NEGATIVE
Protein,UA: NEGATIVE
Specific Gravity, UA: 1.025 (ref 1.005–1.030)
Urobilinogen, Ur: 0.2 mg/dL (ref 0.2–1.0)
pH, UA: 5.5 (ref 5.0–7.5)

## 2020-08-06 LAB — MICROSCOPIC EXAMINATION
Bacteria, UA: NONE SEEN
Epithelial Cells (non renal): NONE SEEN /hpf (ref 0–10)
Renal Epithel, UA: NONE SEEN /hpf
WBC, UA: NONE SEEN /hpf (ref 0–5)

## 2020-08-06 MED ORDER — FINASTERIDE 5 MG PO TABS: 5.0000 mg | ORAL_TABLET | Freq: Every day | ORAL | 3 refills | Status: DC

## 2020-08-06 MED ORDER — SILODOSIN 8 MG PO CAPS: 8.0000 mg | ORAL_CAPSULE | Freq: Every day | ORAL | 3 refills | Status: DC

## 2020-08-06 NOTE — Patient Instructions (Signed)
Prostate Cancer  The prostate is a male gland that helps make semen. Prostate cancer is when abnormal cells grow in this gland. Follow these instructions at home:  Take over-the-counter and prescription medicines only as told by your doctor.  Eat a healthy diet.  Get plenty of sleep.  Ask your doctor for help to find a support group for men with prostate cancer.  Keep all follow-up visits as told by your doctor. This is important.  If you have to go to the hospital, let your cancer doctor (oncologist) know.  Touch, hold, hug, and caress your partner to continue to show sexual feelings. Contact a doctor if:  You have trouble peeing (urinating).  You have blood in your pee (urine).  You have pain in your hips, back, or chest. Get help right away if:  You have weakness in your legs.  You lose feeling (have numbness) in your legs.  You cannot control your pee or your poop (stool).  You have trouble breathing.  You have sudden pain in your chest.  You have chills or a fever. Summary  The prostate is a male gland that helps make semen. Prostate cancer is when abnormal cells grow in this gland.  Ask your doctor for help to find a support group for men with prostate cancer.  Contact a doctor if you have problems peeing or have any new pain that you did not have before. This information is not intended to replace advice given to you by your health care provider. Make sure you discuss any questions you have with your health care provider. Document Revised: 08/05/2017 Document Reviewed: 05/03/2016 Elsevier Patient Education  2020 [employer]  

## 2020-08-06 NOTE — Progress Notes (Signed)
08/06/2020 3:21 PM   Kobyn Kidd [birth year] [id number]  Referring provider: Ladell Pier, MD [place],  [place] [address]  followup BPH and Prostate cancer  HPI: [name] is a 70CN here for followup for BPH and prostate cancer. PSA remains undetectable. He is currently on rapaflo 8mg . Nocturia 2x. NO urinary frequency. NO incontinence. Urine stream is fair. He has intermittent mild burning with urination. UA today shows 3-10 RBCs.  He gets hot flashes at night which does not bother him.    PMH: Past Medical History:  Diagnosis Date  . Elevated PSA   . HLD (hyperlipidemia)    side effect from Tamsulosin  . Left knee pain   . Low back pain   . Pre-diabetes   . Prostate cancer ([place])   . Rotator cuff disorder, right     Surgical History: Past Surgical History:  Procedure Laterality Date  . COLONOSCOPY    . PROSTATE BIOPSY    . PROSTATE BIOPSY    . RADIOACTIVE SEED IMPLANT N/A 01/02/2018   Procedure: RADIOACTIVE SEED IMPLANT/BRACHYTHERAPY IMPLANT;  Surgeon: Cleon Gustin, MD;  Location: [facility];  Service: Urology;  Laterality: N/A;  . SHOULDER ARTHROSCOPY WITH ROTATOR CUFF REPAIR AND SUBACROMIAL DECOMPRESSION Right 08/05/2017   Procedure: RIGHT SHOULDER ARTHROSCOPY WITH ROTATOR CUFF REPAIR, DISTAL CLAVICLE EXCISION, DEBRIDEMENT AND SUBACROMIAL DECOMPRESSION;  Surgeon: Leandrew Koyanagi, MD;  Location: [place];  Service: Orthopedics;  Laterality: Right;  . SPACE OAR INSTILLATION N/A 01/02/2018   Procedure: SPACE OAR INSTILLATION;  Surgeon: Cleon Gustin, MD;  Location: [facility];  Service: Urology;  Laterality: N/A;    Home Medications:  Allergies as of 08/06/2020      Reactions   Aspirin    States he can take if he takes acid reducer medicine first      Medication List       Accurate as of August 06, 2020  3:21 PM. If you have any questions, ask your nurse or doctor.        STOP  taking these medications   alfuzosin 10 MG 24 hr tablet Commonly known as: UROXATRAL Stopped by: Nicolette Bang, MD   diclofenac Sodium 1 % Gel Commonly known as: Voltaren Stopped by: Nicolette Bang, MD   hydrocortisone 1 % ointment Stopped by: Nicolette Bang, MD   tadalafil 20 MG tablet Commonly known as: CIALIS Stopped by: Nicolette Bang, MD     TAKE these medications   atorvastatin 10 MG tablet Commonly known as: LIPITOR Take 1 tablet (10 mg total) by mouth daily.   metFORMIN 500 MG tablet Commonly known as: GLUCOPHAGE Take 0.5 tablets (250 mg total) by mouth daily with breakfast.   silodosin 8 MG Caps capsule Commonly known as: RAPAFLO Take 1 capsule (8 mg total) by mouth daily with breakfast.       Allergies:  Allergies  Allergen Reactions  . Aspirin     States he can take if he takes acid reducer medicine first    Family History: No family history on file.  Social History:  reports that he has never smoked. He has never used smokeless tobacco. He reports previous alcohol use. He reports that he does not use drugs.  ROS: All other review of systems were reviewed and are negative except what is noted above in HPI  Physical Exam: BP 118/75   Pulse 74   Temp 98.6 F (37 C)   Ht 6\' 2"  (1.88  m)   Wt 268 lb (121.6 kg)   BMI 34.41 kg/m   Constitutional:  Alert and oriented, No acute distress. HEENT: [place] AT, moist mucus membranes.  Trachea midline, no masses. Cardiovascular: No clubbing, cyanosis, or edema. Respiratory: Normal respiratory effort, no increased work of breathing. GI: Abdomen is soft, nontender, nondistended, no abdominal masses GU: No CVA tenderness.  Lymph: No cervical or inguinal lymphadenopathy. Skin: No rashes, bruises or suspicious lesions. Neurologic: Grossly intact, no focal deficits, moving all 4 extremities. Psychiatric: Normal mood and affect.  Laboratory Data: Lab Results  Component Value Date   WBC 5.1 11/12/2019    HGB 12.3 (L) 11/12/2019   HCT 36.3 (L) 11/12/2019   MCV 81 11/12/2019   PLT 217 11/12/2019    Lab Results  Component Value Date   CREATININE 1.07 12/07/2019    No results found for: PSA  No results found for: TESTOSTERONE  Lab Results  Component Value Date   HGBA1C 6.2 (H) 03/06/2020    Urinalysis    Component Value Date/Time   COLORURINE STRAW (A) 02/24/2018 1611   APPEARANCEUR Clear 08/06/2020 1459   LABSPEC 1.004 (L) 02/24/2018 1611   PHURINE 7.0 02/24/2018 1611   GLUCOSEU Negative 08/06/2020 1459   HGBUR SMALL (A) 02/24/2018 1611   BILIRUBINUR Negative 08/06/2020 [place] 02/24/2018 1611   PROTEINUR Negative 08/06/2020 1459   PROTEINUR NEGATIVE 02/24/2018 1611   UROBILINOGEN negative (A) 01/21/2020 1430   NITRITE Negative 08/06/2020 1459   NITRITE NEGATIVE 02/24/2018 1611   LEUKOCYTESUR Negative 08/06/2020 1459    Lab Results  Component Value Date   LABMICR See below: 08/06/2020   WBCUA None seen 08/06/2020   LABEPIT None seen 08/06/2020   BACTERIA None seen 08/06/2020    Pertinent Imaging:  No results found for this or any previous visit.  No results found for this or any previous visit.  No results found for this or any previous visit.  No results found for this or any previous visit.  No results found for this or any previous visit.  No results found for this or any previous visit.  No results found for this or any previous visit.  No results found for this or any previous visit.   Assessment & Plan:    1. Benign prostatic hyperplasia with urinary obstruction -continue rapaflo. Add finasteride 5mg   - Urinalysis, Routine w reflex microscopic  2. Malignant neoplasm of prostate ([place]) -RTC 6 months with PSA  3. Weak urinary stream -Continue rapaflo 8mg     No follow-ups on file.  Nicolette Bang, MD  [facility] [place]

## 2020-08-06 NOTE — Progress Notes (Signed)

## 2020-08-07 MED FILL — FINASTERIDE 5 MG TABLET: 5 | 30 days supply | Qty: 30 | Fill #0

## 2020-08-20 MED FILL — SILODOSIN 8 MG CAPS: 8 | 30 days supply | Qty: 30 | Fill #4

## 2020-08-20 MED FILL — METFORMIN HCL 500 MG TABS: 500 | 30 days supply | Qty: 15 | Fill #5

## 2020-08-25 MED FILL — METFORMIN HCL 500 MG TABS: 500 | 30 days supply | Qty: 15 | Fill #6

## 2020-08-25 MED FILL — ?ATORVASTATIN 10 MG TABLET: 10 | 30 days supply | Qty: 30 | Fill #5

## 2020-09-04 MED FILL — FINASTERIDE 5 MG TABLET: 5 | 30 days supply | Qty: 30 | Fill #1

## 2020-09-22 MED FILL — SILODOSIN 8 MG CAPS: 8 | 30 days supply | Qty: 30 | Fill #5

## 2020-09-26 MED FILL — ?ATORVASTATIN 10 MG TABLET: 10 | 30 days supply | Qty: 30 | Fill #6

## 2020-10-01 ENCOUNTER — Encounter: Payer: Self-pay | Admitting: Internal Medicine

## 2020-10-01 DIAGNOSIS — H538 Other visual disturbances: Secondary | ICD-10-CM

## 2020-10-06 MED FILL — FINASTERIDE 5 MG TABLET: 5 | 30 days supply | Qty: 30 | Fill #2

## 2020-10-20 MED FILL — SILODOSIN 8 MG CAPS: 8 | 30 days supply | Qty: 30 | Fill #6

## 2020-10-20 MED FILL — METFORMIN HCL 500 MG TABS: 500 | 30 days supply | Qty: 15 | Fill #7

## 2020-10-28 MED FILL — ?ATORVASTATIN 10 MG TABLET: 10 | 30 days supply | Qty: 30 | Fill #7

## 2020-11-05 MED FILL — FINASTERIDE 5 MG TABLET: 5 | 30 days supply | Qty: 30 | Fill #3

## 2020-11-28 MED FILL — ?ATORVASTATIN 10 MG TABLET: 10 | 30 days supply | Qty: 30 | Fill #8

## 2020-12-06 ENCOUNTER — Other Ambulatory Visit: Payer: Self-pay

## 2020-12-06 MED FILL — Finasteride Tab 5 MG: ORAL | 30 days supply | Qty: 30 | Fill #0 | Status: CN

## 2020-12-07 ENCOUNTER — Other Ambulatory Visit: Payer: Self-pay

## 2020-12-15 ENCOUNTER — Other Ambulatory Visit: Payer: Self-pay

## 2020-12-16 ENCOUNTER — Other Ambulatory Visit: Payer: Self-pay

## 2020-12-16 MED FILL — Finasteride Tab 5 MG: ORAL | 30 days supply | Qty: 30 | Fill #0 | Status: AC

## 2020-12-17 ENCOUNTER — Other Ambulatory Visit: Payer: Self-pay

## 2020-12-22 ENCOUNTER — Other Ambulatory Visit: Payer: Self-pay | Admitting: Internal Medicine

## 2020-12-22 ENCOUNTER — Other Ambulatory Visit: Payer: Self-pay

## 2020-12-22 MED FILL — Silodosin Cap 8 MG: ORAL | 30 days supply | Qty: 30 | Fill #0 | Status: AC

## 2020-12-22 NOTE — Telephone Encounter (Signed)
Requested medication (s) are due for refill today: no  Requested medication (s) are on the active medication list: yes  Last refill: 10/20/2020  Future visit scheduled:  no  Notes to clinic: overdue for office visit  Review for refill    Requested Prescriptions  Pending Prescriptions Disp Refills   metFORMIN (GLUCOPHAGE) 500 MG tablet 30 tablet 3    Sig: TAKE 1/2 TABLET BY MOUTH DAILY WITH BREAKFAST.      Endocrinology:  Diabetes - Biguanides Failed - 12/22/2020  8:59 AM      Failed - Cr in normal range and within 360 days    Creatinine, Ser  Date Value Ref Range Status  12/07/2019 1.07 0.76 - 1.27 mg/dL Final          Failed - HBA1C is between 0 and 7.9 and within 180 days    Hgb A1c MFr Bld  Date Value Ref Range Status  03/06/2020 6.2 (H) 4.8 - 5.6 % Final    Comment:             Prediabetes: 5.7 - 6.4          Diabetes: >6.4          Glycemic control for adults with diabetes: <7.0           Failed - AA eGFR in normal range and within 360 days    GFR calc Af Amer  Date Value Ref Range Status  12/07/2019 84 >59 mL/min/1.73 Final   GFR calc non Af Amer  Date Value Ref Range Status  12/07/2019 72 >59 mL/min/1.73 Final          Failed - Valid encounter within last 6 months    Recent Outpatient Visits           9 months ago Prediabetes   [place], Deborah B, MD   1 year ago Prediabetes   [place], Deborah B, MD   1 year ago Need for Tdap vaccination   [place], Jarome Matin, RPH-CPP   1 year ago Hyperlipidemia, unspecified hyperlipidemia type   [place], Deborah B, MD   1 year ago Obesity (BMI 30-39.9)   [place], MD       Future Appointments             In 1 month McKenzie, Candee Furbish, MD [place]

## 2020-12-23 ENCOUNTER — Other Ambulatory Visit: Payer: Self-pay

## 2020-12-23 MED ORDER — METFORMIN HCL 500 MG PO TABS
250.0000 mg | ORAL_TABLET | Freq: Every day | ORAL | 0 refills | Status: DC
  Filled 2020-12-23: qty 15, 30d supply, fill #0

## 2020-12-24 ENCOUNTER — Other Ambulatory Visit: Payer: Self-pay

## 2020-12-24 ENCOUNTER — Other Ambulatory Visit: Payer: Self-pay | Admitting: Pharmacy Technician

## 2020-12-25 ENCOUNTER — Other Ambulatory Visit: Payer: Self-pay

## 2020-12-31 ENCOUNTER — Other Ambulatory Visit: Payer: Self-pay | Admitting: Internal Medicine

## 2020-12-31 ENCOUNTER — Other Ambulatory Visit: Payer: Self-pay

## 2020-12-31 NOTE — Telephone Encounter (Signed)
  Notes to clinic: medication requested show that it was d/c  Review for continued use and refill    Requested Prescriptions  Pending Prescriptions Disp Refills   atorvastatin (LIPITOR) 10 MG tablet 90 tablet 2    Sig: TAKE 1 TABLET (10 MG TOTAL) BY MOUTH DAILY.      Cardiovascular:  Antilipid - Statins Failed - 12/31/2020  9:35 AM      Failed - Total Cholesterol in normal range and within 360 days    Cholesterol, Total  Date Value Ref Range Status  11/12/2019 150 100 - 199 mg/dL Final          Failed - LDL in normal range and within 360 days    LDL Chol Calc (NIH)  Date Value Ref Range Status  11/12/2019 84 0 - 99 mg/dL Final          Failed - HDL in normal range and within 360 days    HDL  Date Value Ref Range Status  11/12/2019 52 >39 mg/dL Final          Failed - Triglycerides in normal range and within 360 days    Triglycerides  Date Value Ref Range Status  11/12/2019 74 0 - 149 mg/dL Final          Passed - Patient is not pregnant      Passed - Valid encounter within last 12 months    Recent Outpatient Visits           10 months ago Prediabetes   [place], Deborah B, MD   1 year ago Prediabetes   [place], Deborah B, MD   1 year ago Need for Tdap vaccination   [place], Jarome Matin, RPH-CPP   1 year ago Hyperlipidemia, unspecified hyperlipidemia type   [place] Ladell Pier, MD   1 year ago Obesity (BMI 30-39.9)   [place], MD       Future Appointments             In 1 month McKenzie, Candee Furbish, MD [place]

## 2021-01-01 ENCOUNTER — Encounter: Payer: Self-pay | Admitting: Internal Medicine

## 2021-01-01 ENCOUNTER — Other Ambulatory Visit: Payer: Self-pay

## 2021-01-01 MED ORDER — ATORVASTATIN CALCIUM 10 MG PO TABS
10.0000 mg | ORAL_TABLET | Freq: Every day | ORAL | 0 refills | Status: DC
  Filled 2021-01-01: qty 30, 30d supply, fill #0

## 2021-01-02 ENCOUNTER — Other Ambulatory Visit: Payer: Self-pay

## 2021-01-09 ENCOUNTER — Ambulatory Visit: Payer: Self-pay | Attending: Internal Medicine

## 2021-01-09 ENCOUNTER — Other Ambulatory Visit: Payer: Self-pay

## 2021-01-12 ENCOUNTER — Other Ambulatory Visit: Payer: Self-pay

## 2021-01-12 ENCOUNTER — Other Ambulatory Visit: Payer: Self-pay | Admitting: Internal Medicine

## 2021-01-12 MED ORDER — METFORMIN HCL 500 MG PO TABS
250.0000 mg | ORAL_TABLET | Freq: Every day | ORAL | 0 refills | Status: DC
  Filled 2021-01-12: qty 15, 30d supply, fill #0

## 2021-01-12 MED FILL — Finasteride Tab 5 MG: ORAL | 30 days supply | Qty: 30 | Fill #1 | Status: AC

## 2021-01-12 NOTE — Telephone Encounter (Signed)
   Notes to clinic: Patient has appt schedule for 01/29/2021 Review for another short supply   Requested Prescriptions  Pending Prescriptions Disp Refills   metFORMIN (GLUCOPHAGE) 500 MG tablet 15 tablet 0    Sig: Take 0.5 tablets (250 mg total) by mouth daily with breakfast.      Endocrinology:  Diabetes - Biguanides Failed - 01/12/2021  9:46 AM      Failed - Cr in normal range and within 360 days    Creatinine, Ser  Date Value Ref Range Status  12/07/2019 1.07 0.76 - 1.27 mg/dL Final          Failed - HBA1C is between 0 and 7.9 and within 180 days    Hgb A1c MFr Bld  Date Value Ref Range Status  03/06/2020 6.2 (H) 4.8 - 5.6 % Final    Comment:             Prediabetes: 5.7 - 6.4          Diabetes: >6.4          Glycemic control for adults with diabetes: <7.0           Failed - AA eGFR in normal range and within 360 days    GFR calc Af Amer  Date Value Ref Range Status  12/07/2019 84 >59 mL/min/1.73 Final   GFR calc non Af Amer  Date Value Ref Range Status  12/07/2019 72 >59 mL/min/1.73 Final          Failed - Valid encounter within last 6 months    Recent Outpatient Visits           10 months ago Prediabetes   [place], Deborah B, MD   1 year ago Prediabetes   [place], Deborah B, MD   1 year ago Need for Tdap vaccination   [place], Stephen L, RPH-CPP   1 year ago Hyperlipidemia, unspecified hyperlipidemia type   [place], Deborah B, MD   1 year ago Obesity (BMI 30-39.9)   [place], MD       Future Appointments             In 2 weeks Joya Gaskins Burnett Harry, MD [place]   In 3 weeks McKenzie, Candee Furbish, MD [place]

## 2021-01-13 ENCOUNTER — Other Ambulatory Visit: Payer: Self-pay

## 2021-01-15 ENCOUNTER — Telehealth: Payer: Self-pay

## 2021-01-15 NOTE — Telephone Encounter (Signed)
Patient will be out of town when these medications will be out. He is asking if they can be filled now.  silodosin (RAPAFLO) 8 MG CAPS capsule finasteride (PROSCAR) 5 MG tablet   [address] Phone:  [phone]  Fax:  [phone]     Thanks, Helene Kelp

## 2021-01-15 NOTE — Telephone Encounter (Signed)
Spoke with patient and made him aware he can see if his insurance will cover the early refill if not he may have to pay out of pocket.

## 2021-01-18 ENCOUNTER — Encounter: Payer: Self-pay | Admitting: Internal Medicine

## 2021-01-20 ENCOUNTER — Other Ambulatory Visit: Payer: Self-pay

## 2021-01-20 ENCOUNTER — Other Ambulatory Visit: Payer: Self-pay | Admitting: Pharmacist

## 2021-01-20 DIAGNOSIS — N138 Other obstructive and reflux uropathy: Secondary | ICD-10-CM

## 2021-01-20 MED ORDER — SILODOSIN 8 MG PO CAPS
8.0000 mg | ORAL_CAPSULE | Freq: Every day | ORAL | 0 refills | Status: DC
  Filled 2021-01-20: qty 30, 30d supply, fill #0

## 2021-01-20 MED ORDER — ATORVASTATIN CALCIUM 10 MG PO TABS
10.0000 mg | ORAL_TABLET | Freq: Every day | ORAL | 0 refills | Status: DC
  Filled 2021-01-20 – 2021-01-21 (×2): qty 30, 30d supply, fill #0

## 2021-01-20 MED ORDER — FINASTERIDE 5 MG PO TABS
ORAL_TABLET | Freq: Every day | ORAL | 0 refills | Status: DC
  Filled 2021-01-20: qty 30, fill #0
  Filled 2021-01-21: qty 30, 30d supply, fill #0

## 2021-01-20 MED ORDER — METFORMIN HCL 500 MG PO TABS
250.0000 mg | ORAL_TABLET | Freq: Every day | ORAL | 0 refills | Status: DC
  Filled 2021-01-20 – 2021-01-21 (×2): qty 15, 30d supply, fill #0

## 2021-01-21 ENCOUNTER — Other Ambulatory Visit: Payer: Self-pay

## 2021-01-29 ENCOUNTER — Other Ambulatory Visit: Payer: Self-pay

## 2021-01-29 ENCOUNTER — Ambulatory Visit: Payer: Self-pay | Admitting: Critical Care Medicine

## 2021-02-06 ENCOUNTER — Ambulatory Visit: Payer: Self-pay | Admitting: Urology

## 2021-03-03 ENCOUNTER — Other Ambulatory Visit: Payer: Self-pay

## 2021-03-03 ENCOUNTER — Encounter: Payer: Self-pay | Admitting: Critical Care Medicine

## 2021-03-03 ENCOUNTER — Ambulatory Visit: Payer: Self-pay | Attending: Critical Care Medicine | Admitting: Critical Care Medicine

## 2021-03-03 DIAGNOSIS — E669 Obesity, unspecified: Secondary | ICD-10-CM

## 2021-03-03 DIAGNOSIS — R030 Elevated blood-pressure reading, without diagnosis of hypertension: Secondary | ICD-10-CM

## 2021-03-03 DIAGNOSIS — M75101 Unspecified rotator cuff tear or rupture of right shoulder, not specified as traumatic: Secondary | ICD-10-CM

## 2021-03-03 DIAGNOSIS — M12811 Other specific arthropathies, not elsewhere classified, right shoulder: Secondary | ICD-10-CM

## 2021-03-03 DIAGNOSIS — R7303 Prediabetes: Secondary | ICD-10-CM

## 2021-03-03 DIAGNOSIS — E782 Mixed hyperlipidemia: Secondary | ICD-10-CM

## 2021-03-03 DIAGNOSIS — N138 Other obstructive and reflux uropathy: Secondary | ICD-10-CM

## 2021-03-03 DIAGNOSIS — N401 Enlarged prostate with lower urinary tract symptoms: Secondary | ICD-10-CM

## 2021-03-03 DIAGNOSIS — C61 Malignant neoplasm of prostate: Secondary | ICD-10-CM

## 2021-03-03 MED ORDER — METFORMIN HCL 500 MG PO TABS
250.0000 mg | ORAL_TABLET | Freq: Every day | ORAL | 3 refills | Status: DC
  Filled 2021-03-03: qty 15, 30d supply, fill #0
  Filled 2021-04-29: qty 15, 30d supply, fill #1
  Filled 2021-05-27: qty 15, 30d supply, fill #2
  Filled 2021-06-25: qty 15, 30d supply, fill #3
  Filled 2021-07-27: qty 15, 30d supply, fill #4
  Filled 2021-08-26: qty 15, 30d supply, fill #5
  Filled 2021-09-25: qty 15, 30d supply, fill #6
  Filled 2021-09-25: qty 15, 30d supply, fill #0
  Filled 2021-10-27: qty 15, 30d supply, fill #1

## 2021-03-03 MED ORDER — SILODOSIN 8 MG PO CAPS
8.0000 mg | ORAL_CAPSULE | Freq: Every day | ORAL | 3 refills | Status: DC
  Filled 2021-03-03: qty 30, 30d supply, fill #0

## 2021-03-03 MED ORDER — ATORVASTATIN CALCIUM 10 MG PO TABS
10.0000 mg | ORAL_TABLET | Freq: Every day | ORAL | 2 refills | Status: DC
  Filled 2021-03-03: qty 30, 30d supply, fill #0
  Filled 2021-04-29: qty 30, 30d supply, fill #1
  Filled 2021-05-27: qty 30, 30d supply, fill #2

## 2021-03-03 MED ORDER — FINASTERIDE 5 MG PO TABS
ORAL_TABLET | Freq: Every day | ORAL | 2 refills | Status: DC
  Filled 2021-03-03: qty 30, 30d supply, fill #0

## 2021-03-03 NOTE — Assessment & Plan Note (Signed)
Continue low-dose metformin for prediabetes bring patient in for follow-up labs

## 2021-03-03 NOTE — Assessment & Plan Note (Signed)
Went over dietary education and exercise education with this patient

## 2021-03-03 NOTE — Assessment & Plan Note (Signed)
Improved with Rapaflo and Proscar follow-up urology

## 2021-03-03 NOTE — Assessment & Plan Note (Signed)
Status postsurgical repair

## 2021-03-03 NOTE — Assessment & Plan Note (Signed)
History of prostate cancer status post radiation seed followed by hormonal therapy  Follow-up PSA per urology

## 2021-03-03 NOTE — Assessment & Plan Note (Signed)
Blood pressure readings been stable at home we will bring the patient in for direct office exam to assess  Continue healthy diet as prescribed

## 2021-03-03 NOTE — Assessment & Plan Note (Signed)
Bring patient in for lipid assessment continue atorvastatin 10 mg daily

## 2021-03-03 NOTE — Progress Notes (Signed)
Established Patient Office Visit  Subjective:  Patient ID: Roy Reed, male    DOB: [birth year]  Age: 65 y.o. MRN: [id number] Virtual Visit via Video Note  I connected withNAME@ on 03/03/21 at 11 am  by a video enabled telemedicine application and verified that I am speaking with the correct person using two identifiers.   Consent:  I discussed the limitations, risks, security and privacy concerns of performing an evaluation and management service by video visit and the availability of in person appointments. I also discussed with the patient that there may be a patient responsible charge related to this service. The patient expressed understanding and agreed to proceed.  Location of patient: Patient's at home   Location of provider: I am in my office  Persons participating in the televisit with the patient.   Eddie Dibbles of [employer] was on the line interpreter [phone] [place]    History of Present Illness:  CC: Follow-up BPH, prostate cancer, obesity, back pain, other chronic medical conditions  HPI Roy Reed presents for primary care follow-up visit.  This patient is a primary care patient of Dr. Wynetta Emery has not been seen in our clinic since March 2021.  He is struggled to get return visits.  The visit is accomplished with [place] interpreter Eddie Dibbles who is on the video conference with [name].  Patient seen in his home and is in no acute distress.  He has a history of prostate cancer diagnosed in 2018.  He underwent radium seed implants and following this he received hormonal injections every 6 months last injection was 6 months ago and is followed by Dr. Nicolette Bang of urology.  Patient's urine flow has been improved with Rapaflo and Proscar.  He does have chronic right shoulder pain which is improved this was from playing tennis he had a surgical procedure for this in the past.  He also has chronic back pain which appears to be weight-based.  He is now trying to play  tennis more often and is walking his weight those been stable in the 268 range.  He states he eats a diet of fresh vegetables fish she does not eat meat he avoids oils he does not eat fried foods he does eat quite a bit of corn and honey but he is avoiding processed foods.  He is from [place] originally and settled in [name] 5 years ago permanently.  He is now retired.  Patient does have slight edema in the lower extremities and is concerned about this.  He was seen by cardiology end of last year he has some minimal plaque in his left anterior descending artery cardiology do not feel this significantly did not require cardiac catheterization.  He states he is not had further chest pain since this time.  Documentations as noted below from cardiology CAD:  Presented with atypical chest pain.  Coronary CTA on 01/07/2020 showed calcium score 8 (49th percentile), minimal calcified plaque in mid LAD causing 0 to 24% stenosis.  Reports chest pain has resolved.   Hyperlipidemia: On atorvastatin 10 mg daily.  LDL 84 on 11/12/19   Type 2 diabetes: Recent A1c 6.5.  On Metformin   Documentation below for the last urology visit . Benign prostatic hyperplasia with urinary obstruction -continue rapaflo. Add finasteride 5mg   - Urinalysis, Routine w reflex microscopic   2. Malignant neoplasm of prostate ([place]) -RTC 6 months with PSA   3. Weak urinary stream -Continue rapaflo 8mg   Roy Reed is a 66 y.o. male  who presents for chronic ds management. His concerns today include: Pt with hx of pre-DM, HL, rotator cuff tear s/p RT shoulder arthroscopy and prostate CA (hormone and radiation seeds).    Documentation below from the last visit with Dr. Wynetta Emery March 2021 Obesity/PreDM:  Still gaining wgh.  Megace on med list; reports he took only once and did not tolerate due to S.E so urology d/c. -  Recent A1C 6.5.  Blood sugar was normal. -endorses doing well with eating habits.  Thinks the problem is he is not  exercising.  Feels he is not able to synchronize his breathing.  Feels SOB when walking and doing simple things like getting dress or bending over to put on shoes.  This has been going on for months.  He does not feel that it is getting worse.  He thinks it may be related to the weight gain. He gets some intermittent pain below the left breast with exertion.  This has also been going on for 2 to 3 months.  No radiation down the arm or up the neck. -No PND/orthopnea. No swelling in legs.       His other concern is pain in both flank intermittently for 2 to 3 months.  It occurs with certain movement of the trunk.  He feels like the muscles are strained at times.  No initiating factors.    No dysuria or hematuria.   HL:  Taking and tolerating Lipitor.  ALT still mildly elev at 57.   Anemia:  Iron studies revealed ACD.  Reports having had colonoscopy when he lived in [place] that he remembers as being negative.  Denies any blood in his stools.   Reports frequent urination at night.  He wakes up about 4 times.  He feels he completely empty each time.  Good flow stream.  He is on Flomax.  He drinks a lot of water during the day.  Prediabetes Discussed healthy eating habits. Discussed the importance of regular exercise.  However I would have him hold off on starting an exercise program until he is seen the cardiologist for evaluation of the chest pain with shortness of breath that he has been experiencing for the past several months - metFORMIN (GLUCOPHAGE) 500 MG tablet; Take 0.5 tablets (250 mg total) by mouth daily with breakfast.  Dispense: 30 tablet; Refill: 3   2. Obesity (BMI 30-39.9) See #1 above   3. Hyperlipidemia, unspecified hyperlipidemia type Continue atorvastatin   4. Chest pain in adult 5. Dyspnea on exertion -I think deconditioning and weight gain playing a role with the dyspnea on exertion.  However given that he is also having chest pains, I think it is reasonable to have him  evaluated by cardiology before instructing him on starting an exercise program. - EKG 12-Lead - Ambulatory referral to Cardiology     6. Elevated blood pressure reading Blood pressure was elevated at 142/82 today.  Repeat was 130/84.  DASH diet discussed and encouraged.  Follow-up with our clinical pharmacist in 6 weeks.  If blood pressure still elevated, we can start him on low-dose of amlodipine.   7. Normocytic anemia - Ambulatory referral to Gastroenterology   8. Chronic bilateral thoracic back pain I think this is musculoskeletal in nature.  We will try him with topical anti-inflammatory. - diclofenac Sodium (VOLTAREN) 1 % GEL; Apply 2 g topically 4 (four) times daily.  Dispense: 100 g; Refill: 1   9. Prostate cancer ([place]) Follow the nocturia, I recommend increasing the  Flomax to 2 tablets daily. - tamsulosin (FLOMAX) 0.4 MG CAPS capsule; Take 2 capsules (0.8 mg total) by mouth daily.  Dispense: 60 capsule; Refill: 2   10. Screening for colon cancer - Ambulatory referral to Gastroenterology   Patient did have his colonoscopy showed some polyps Edell be rechecked in 3 years  Blood pressure remained stable not on medications     Patient Active Problem List    Diagnosis Date Noted   Chronic left shoulder pain 09/05/2018   Hyperlipidemia 08/15/2018   Obesity (BMI 30-39.9) 08/15/2018   Skin tag 08/15/2018   Malignant neoplasm of prostate ([place]) 10/13/2017   Rotator cuff tear arthropathy of right shoulder 08/31/2017   Personal history of gastric ulcer 02/09/2017    Past Medical History:  Diagnosis Date   Elevated PSA    HLD (hyperlipidemia)    side effect from Tamsulosin   Left knee pain    Low back pain    Pre-diabetes    Prostate cancer ([place])    Rotator cuff disorder, right    Rotator cuff tear arthropathy of right shoulder 08/31/2017    Past Surgical History:  Procedure Laterality Date   COLONOSCOPY     PROSTATE BIOPSY     PROSTATE BIOPSY     RADIOACTIVE SEED  IMPLANT N/A 01/02/2018   Procedure: RADIOACTIVE SEED IMPLANT/BRACHYTHERAPY IMPLANT;  Surgeon: Cleon Gustin, MD;  Location: [facility];  Service: Urology;  Laterality: N/A;   SHOULDER ARTHROSCOPY WITH ROTATOR CUFF REPAIR AND SUBACROMIAL DECOMPRESSION Right 08/05/2017   Procedure: RIGHT SHOULDER ARTHROSCOPY WITH ROTATOR CUFF REPAIR, DISTAL CLAVICLE EXCISION, DEBRIDEMENT AND SUBACROMIAL DECOMPRESSION;  Surgeon: Leandrew Koyanagi, MD;  Location: [facility];  Service: Orthopedics;  Laterality: Right;   SPACE OAR INSTILLATION N/A 01/02/2018   Procedure: SPACE OAR INSTILLATION;  Surgeon: Cleon Gustin, MD;  Location: [facility];  Service: Urology;  Laterality: N/A;    History reviewed. No pertinent family history.  Social History   Socioeconomic History   Marital status: Married    Spouse name: Not on file   Number of children: 6   Years of education: Not on file   Highest education level: Not on file  Occupational History   Occupation: retired  Tobacco Use   Smoking status: Never   Smokeless tobacco: Never  Vaping Use   Vaping Use: Never used  Substance and Sexual Activity   Alcohol use: Not Currently   Drug use: No   Sexual activity: Not on file  Other Topics Concern   Not on file  Social History Narrative   Not on file   Social Determinants of Health   Financial Resource Strain: Not on file  Food Insecurity: Not on file  Transportation Needs: Not on file  Physical Activity: Not on file  Stress: Not on file  Social Connections: Not on file  Intimate Partner Violence: Not on file    Outpatient Medications Prior to Visit  Medication Sig Dispense Refill   atorvastatin (LIPITOR) 10 MG tablet Take 1 tablet (10 mg total) by mouth daily. 30 tablet 0   finasteride (PROSCAR) 5 MG tablet TAKE 1 TABLET (5 MG TOTAL) BY MOUTH DAILY. 30 tablet 0   metFORMIN (GLUCOPHAGE) 500 MG tablet Take 0.5 tablets (250 mg total) by mouth daily  with breakfast. 15 tablet 0   silodosin (RAPAFLO) 8 MG CAPS capsule Take 1 capsule (8 mg total) by mouth daily with breakfast. 30 capsule 0   No facility-administered medications prior  to visit.    Allergies  Allergen Reactions   Aspirin     States he can take if he takes acid reducer medicine first    ROS Review of Systems  HENT: Negative.    Respiratory: Negative.    Cardiovascular:  Positive for leg swelling. Negative for chest pain.  Genitourinary:  Negative for difficulty urinating.  Musculoskeletal:  Positive for back pain.  Skin: Negative.   Neurological: Negative.   Hematological: Negative.   Psychiatric/Behavioral: Negative.       Objective:    Physical Exam  There were no vitals taken for this visit. No exam this is a video visit patient was seen in his home and in no acute distress Wt Readings from Last 3 Encounters:  08/06/20 268 lb (121.6 kg)  03/17/20 256 lb (116.1 kg)  02/25/20 269 lb 3.2 oz (122.1 kg)     Health Maintenance Due  Topic Date Due   COVID-19 Vaccine (1) Never done   Zoster Vaccines- Shingrix (1 of 2) Never done   PNA vac Low Risk Adult (2 of 2 - PPSV23) 08/05/2020    There are no preventive care reminders to display for this patient.  No results found for: TSH Lab Results  Component Value Date   WBC 5.1 11/12/2019   HGB 12.3 (L) 11/12/2019   HCT 36.3 (L) 11/12/2019   MCV 81 11/12/2019   PLT 217 11/12/2019   Lab Results  Component Value Date   NA 139 12/07/2019   K 4.8 12/07/2019   CO2 22 12/07/2019   GLUCOSE 106 (H) 12/07/2019   BUN 15 12/07/2019   CREATININE 1.07 12/07/2019   BILITOT <0.2 11/12/2019   ALKPHOS 104 11/12/2019   AST 32 11/12/2019   ALT 57 (H) 11/12/2019   PROT 7.4 11/12/2019   ALBUMIN 4.1 11/12/2019   CALCIUM 9.6 12/07/2019   ANIONGAP 11 12/26/2017   Lab Results  Component Value Date   CHOL 150 11/12/2019   Lab Results  Component Value Date   HDL 52 11/12/2019   Lab Results  Component Value  Date   LDLCALC 84 11/12/2019   Lab Results  Component Value Date   TRIG 74 11/12/2019   Lab Results  Component Value Date   CHOLHDL 2.9 11/12/2019   Lab Results  Component Value Date   HGBA1C 6.2 (H) 03/06/2020      Assessment & Plan:   Problem List Items Addressed This Visit       Musculoskeletal and Integument   RESOLVED: Rotator cuff tear arthropathy of right shoulder    Status postsurgical repair         Genitourinary   Malignant neoplasm of prostate ([place])    History of prostate cancer status post radiation seed followed by hormonal therapy  Follow-up PSA per urology       Benign prostatic hyperplasia with urinary obstruction    Improved with Rapaflo and Proscar follow-up urology       Relevant Medications   finasteride (PROSCAR) 5 MG tablet   silodosin (RAPAFLO) 8 MG CAPS capsule     Other   Hyperlipidemia    Bring patient in for lipid assessment continue atorvastatin 10 mg daily       Relevant Medications   atorvastatin (LIPITOR) 10 MG tablet   Obesity (BMI 30-39.9)    Went over dietary education and exercise education with this patient       Relevant Medications   metFORMIN (GLUCOPHAGE) 500 MG tablet   Elevated blood pressure  reading    Blood pressure readings been stable at home we will bring the patient in for direct office exam to assess  Continue healthy diet as prescribed       Prediabetes    Continue low-dose metformin for prediabetes bring patient in for follow-up labs        Meds ordered this encounter  Medications   atorvastatin (LIPITOR) 10 MG tablet    Sig: Take 1 tablet (10 mg total) by mouth daily.    Dispense:  30 tablet    Refill:  2    Must have office visit for refills   finasteride (PROSCAR) 5 MG tablet    Sig: TAKE 1 TABLET (5 MG TOTAL) BY MOUTH DAILY.    Dispense:  30 tablet    Refill:  2   metFORMIN (GLUCOPHAGE) 500 MG tablet    Sig: Take 0.5 tablets (250 mg total) by mouth daily with breakfast.     Dispense:  30 tablet    Refill:  3   silodosin (RAPAFLO) 8 MG CAPS capsule    Sig: Take 1 capsule (8 mg total) by mouth daily with breakfast.    Dispense:  30 capsule    Refill:  3    Follow-up: No follow-ups on file.   Follow Up Instructions: Patient knows a direct exam and visit will occur in July   I discussed the assessment and treatment plan with the patient. The patient was provided an opportunity to ask questions and all were answered. The patient agreed with the plan and demonstrated an understanding of the instructions.   The patient was advised to call back or seek an in-person evaluation if the symptoms worsen or if the condition fails to improve as anticipated.  I provided 46 minutes of non-face-to-face time during this encounter  including  median intraservice time , review of notes, labs, imaging, medications  and explaining diagnosis and management to the patient .    Asencion Noble, MD

## 2021-03-04 ENCOUNTER — Other Ambulatory Visit: Payer: Self-pay

## 2021-03-06 ENCOUNTER — Other Ambulatory Visit: Payer: Self-pay

## 2021-03-13 ENCOUNTER — Ambulatory Visit: Payer: Self-pay | Admitting: Urology

## 2021-03-24 ENCOUNTER — Other Ambulatory Visit: Payer: Self-pay

## 2021-03-24 DIAGNOSIS — C61 Malignant neoplasm of prostate: Secondary | ICD-10-CM

## 2021-03-25 LAB — PSA: Prostate Specific Ag, Serum: <0.1 ng/mL (ref 0.0–4.0)

## 2021-03-31 ENCOUNTER — Encounter: Payer: Self-pay | Admitting: Internal Medicine

## 2021-03-31 ENCOUNTER — Other Ambulatory Visit: Payer: Self-pay

## 2021-03-31 ENCOUNTER — Ambulatory Visit: Payer: Self-pay | Attending: Internal Medicine | Admitting: Internal Medicine

## 2021-03-31 ENCOUNTER — Encounter: Payer: Self-pay | Admitting: Urology

## 2021-03-31 ENCOUNTER — Ambulatory Visit (INDEPENDENT_AMBULATORY_CARE_PROVIDER_SITE_OTHER): Payer: Self-pay | Admitting: Urology

## 2021-03-31 VITALS — BP 130/81 | HR 83

## 2021-03-31 VITALS — BP 128/82 | HR 94 | Resp 16 | Wt 265.4 lb

## 2021-03-31 DIAGNOSIS — R3912 Poor urinary stream: Secondary | ICD-10-CM

## 2021-03-31 DIAGNOSIS — R7303 Prediabetes: Secondary | ICD-10-CM

## 2021-03-31 DIAGNOSIS — C61 Malignant neoplasm of prostate: Secondary | ICD-10-CM

## 2021-03-31 DIAGNOSIS — N401 Enlarged prostate with lower urinary tract symptoms: Secondary | ICD-10-CM

## 2021-03-31 DIAGNOSIS — R030 Elevated blood-pressure reading, without diagnosis of hypertension: Secondary | ICD-10-CM

## 2021-03-31 DIAGNOSIS — E785 Hyperlipidemia, unspecified: Secondary | ICD-10-CM

## 2021-03-31 DIAGNOSIS — L309 Dermatitis, unspecified: Secondary | ICD-10-CM

## 2021-03-31 DIAGNOSIS — N138 Other obstructive and reflux uropathy: Secondary | ICD-10-CM

## 2021-03-31 DIAGNOSIS — E669 Obesity, unspecified: Secondary | ICD-10-CM

## 2021-03-31 LAB — URINALYSIS, ROUTINE W REFLEX MICROSCOPIC
Bilirubin, UA: NEGATIVE
Glucose, UA: NEGATIVE
Ketones, UA: NEGATIVE
Leukocytes,UA: NEGATIVE
Nitrite, UA: NEGATIVE
Protein,UA: NEGATIVE
Specific Gravity, UA: 1.015 (ref 1.005–1.030)
Urobilinogen, Ur: 0.2 mg/dL (ref 0.2–1.0)
pH, UA: 7 (ref 5.0–7.5)

## 2021-03-31 LAB — MICROSCOPIC EXAMINATION
Bacteria, UA: NONE SEEN
Epithelial Cells (non renal): NONE SEEN /hpf (ref 0–10)
Renal Epithel, UA: NONE SEEN /hpf
WBC, UA: NONE SEEN /hpf (ref 0–5)

## 2021-03-31 MED ORDER — SILODOSIN 8 MG PO CAPS
8.0000 mg | ORAL_CAPSULE | Freq: Every day | ORAL | 3 refills | Status: DC
  Filled 2021-03-31: qty 30, 30d supply, fill #0
  Filled 2021-05-27: qty 30, 30d supply, fill #1
  Filled 2021-06-25: qty 30, 30d supply, fill #2
  Filled 2021-07-27: qty 30, 30d supply, fill #3

## 2021-03-31 MED ORDER — FINASTERIDE 5 MG PO TABS
ORAL_TABLET | Freq: Every day | ORAL | 2 refills | Status: DC
  Filled 2021-03-31: qty 30, 30d supply, fill #0
  Filled 2021-05-27: qty 30, 30d supply, fill #1
  Filled 2021-06-25: qty 30, 30d supply, fill #2

## 2021-03-31 MED ORDER — TRIAMCINOLONE ACETONIDE 0.1 % EX CREA
1.0000 "application " | TOPICAL_CREAM | Freq: Two times a day (BID) | CUTANEOUS | 1 refills | Status: DC
  Filled 2021-03-31: qty 30, 15d supply, fill #0

## 2021-03-31 NOTE — Patient Instructions (Signed)
Benign Prostatic Hyperplasia  Benign prostatic hyperplasia (BPH) is an enlarged prostate gland that is caused by the normal aging process and not by cancer. The prostate is a walnut-sized gland that is involved in the production of semen. It is located in front of the rectum and below the bladder. The bladder stores urine and the urethra is the tube that carries the urine out of the body. The prostate may get bigger asa man gets older. An enlarged prostate can press on the urethra. This can make it harder to pass urine. The build-up of urine in the bladder can cause infection. Back pressure and infection may progress to bladder damage and kidney (renal) failure. What are the causes? This condition is part of a normal aging process. However, not all men develop problems from this condition. If the prostate enlarges away from the urethra, urine flow will not be blocked. If it enlarges toward the urethra andcompresses it, there will be problems passing urine. What increases the risk? This condition is more likely to develop in men over the age of 50 years. What are the signs or symptoms? Symptoms of this condition include: Getting up often during the night to urinate. Needing to urinate frequently during the day. Difficulty starting urine flow. Decrease in size and strength of your urine stream. Leaking (dribbling) after urinating. Inability to pass urine. This needs immediate treatment. Inability to completely empty your bladder. Pain when you pass urine. This is more common if there is also an infection. Urinary tract infection (UTI). How is this diagnosed? This condition is diagnosed based on your medical history, a physical exam, and your symptoms. Tests will also be done, such as: A post-void bladder scan. This measures any amount of urine that may remain in your bladder after you finish urinating. A digital rectal exam. In a rectal exam, your health care provider checks your prostate by  putting a lubricated, gloved finger into your rectum to feel the back of your prostate gland. This exam detects the size of your gland and any abnormal lumps or growths. An exam of your urine (urinalysis). A prostate specific antigen (PSA) screening. This is a blood test used to screen for prostate cancer. An ultrasound. This test uses sound waves to electronically produce a picture of your prostate gland. Your health care provider may refer you to a specialist in kidney and prostate diseases (urologist). How is this treated? Once symptoms begin, your health care provider will monitor your condition (active surveillance or watchful waiting). Treatment for this condition will depend on the severity of your condition. Treatment may include: Observation and yearly exams. This may be the only treatment needed if your condition and symptoms are mild. Medicines to relieve your symptoms, including: Medicines to shrink the prostate. Medicines to relax the muscle of the prostate. Surgery in severe cases. Surgery may include: Prostatectomy. In this procedure, the prostate tissue is removed completely through an open incision or with a laparoscope or robotics. Transurethral resection of the prostate (TURP). In this procedure, a tool is inserted through the opening at the tip of the penis (urethra). It is used to cut away tissue of the inner core of the prostate. The pieces are removed through the same opening of the penis. This removes the blockage. Transurethral incision (TUIP). In this procedure, small cuts are made in the prostate. This lessens the prostate's pressure on the urethra. Transurethral microwave thermotherapy (TUMT). This procedure uses microwaves to create heat. The heat destroys and removes a small   amount of prostate tissue. Transurethral needle ablation (TUNA). This procedure uses radio frequencies to destroy and remove a small amount of prostate tissue. Interstitial laser coagulation (ILC).  This procedure uses a laser to destroy and remove a small amount of prostate tissue. Transurethral electrovaporization (TUVP). This procedure uses electrodes to destroy and remove a small amount of prostate tissue. Prostatic urethral lift. This procedure inserts an implant to push the lobes of the prostate away from the urethra. Follow these instructions at home: Take over-the-counter and prescription medicines only as told by your health care provider. Monitor your symptoms for any changes. Contact your health care provider with any changes. Avoid drinking large amounts of liquid before going to bed or out in public. Avoid or reduce how much caffeine or alcohol you drink. Give yourself time when you urinate. Keep all follow-up visits as told by your health care provider. This is important. Contact a health care provider if: You have unexplained back pain. Your symptoms do not get better with treatment. You develop side effects from the medicine you are taking. Your urine becomes very dark or has a bad smell. Your lower abdomen becomes distended and you have trouble passing your urine. Get help right away if: You have a fever or chills. You suddenly cannot urinate. You feel lightheaded, or very dizzy, or you faint. There are large amounts of blood or clots in the urine. Your urinary problems become hard to manage. You develop moderate to severe low back or flank pain. The flank is the side of your body between the ribs and the hip. These symptoms may represent a serious problem that is an emergency. Do not wait to see if the symptoms will go away. Get medical help right away. Call your local emergency services (911 in the [place] Do not drive yourself to the hospital. Summary Benign prostatic hyperplasia (BPH) is an enlarged prostate that is caused by the normal aging process and not by cancer. An enlarged prostate can press on the urethra. This can make it hard to pass urine. This  condition is part of a normal aging process and is more likely to develop in men over the age of 50 years. Get help right away if you suddenly cannot urinate. This information is not intended to replace advice given to you by your health care provider. Make sure you discuss any questions you have with your healthcare provider. Document Revised: 05/01/2020 Document Reviewed: 05/01/2020 Elsevier Patient Education  2022 [employer]  

## 2021-03-31 NOTE — Progress Notes (Signed)
03/31/2021 3:21 PM   Roy Reed [birth year] [id number]  Referring provider: Ladell Pier, MD [place],  [place] [address]  Prostate Cancer   HPI: Mr Bridger is a [phone] here for followup for prostate cancer and BPH with Weak stream. Nocturia 2x but he drinsk several glasses of water within 2 hours of going to bed. Dysuria resolved.   IPSS 11 QOL 4. PSA Undetectable. Very rare hot flashes. No other complaints today   PMH: Past Medical History:  Diagnosis Date   Elevated PSA    HLD (hyperlipidemia)    side effect from Tamsulosin   Left knee pain    Low back pain    Pre-diabetes    Prostate cancer ([place])    Rotator cuff disorder, right    Rotator cuff tear arthropathy of right shoulder 08/31/2017    Surgical History: Past Surgical History:  Procedure Laterality Date   COLONOSCOPY     PROSTATE BIOPSY     PROSTATE BIOPSY     RADIOACTIVE SEED IMPLANT N/A 01/02/2018   Procedure: RADIOACTIVE SEED IMPLANT/BRACHYTHERAPY IMPLANT;  Surgeon: Cleon Gustin, MD;  [facility];  Service: Urology;  Laterality: N/A;   SHOULDER ARTHROSCOPY WITH ROTATOR CUFF REPAIR AND SUBACROMIAL DECOMPRESSION Right 08/05/2017   Procedure: RIGHT SHOULDER ARTHROSCOPY WITH ROTATOR CUFF REPAIR, DISTAL CLAVICLE EXCISION, DEBRIDEMENT AND SUBACROMIAL DECOMPRESSION;  Surgeon: Leandrew Koyanagi, MD;  Location: [place];  Service: Orthopedics;  Laterality: Right;   SPACE OAR INSTILLATION N/A 01/02/2018   Procedure: SPACE OAR INSTILLATION;  Surgeon: Cleon Gustin, MD;  Location: [facility];  Service: Urology;  Laterality: N/A;    Home Medications:  Allergies as of 03/31/2021       Reactions   Aspirin    States he can take if he takes acid reducer medicine first        Medication List        Accurate as of March 31, 2021  3:21 PM. If you have any questions, ask your nurse or doctor.          atorvastatin 10 MG  tablet Commonly known as: LIPITOR Take 1 tablet (10 mg total) by mouth daily.   finasteride 5 MG tablet Commonly known as: PROSCAR TAKE 1 TABLET (5 MG TOTAL) BY MOUTH DAILY.   metFORMIN 500 MG tablet Commonly known as: GLUCOPHAGE Take 0.5 tablets (250 mg total) by mouth daily with breakfast.   silodosin 8 MG Caps capsule Commonly known as: RAPAFLO Take 1 capsule (8 mg total) by mouth daily with breakfast.   triamcinolone cream 0.1 % Commonly known as: KENALOG Apply 1 application topically 2 (two) times daily. Started by: Karle Plumber, MD        Allergies:  Allergies  Allergen Reactions   Aspirin     States he can take if he takes acid reducer medicine first    Family History: No family history on file.  Social History:  reports that he has never smoked. He has never used smokeless tobacco. He reports previous alcohol use. He reports that he does not use drugs.  ROS: All other review of systems were reviewed and are negative except what is noted above in HPI  Physical Exam: BP 130/81   Pulse 83   Constitutional:  Alert and oriented, No acute distress. HEENT: [place] AT, moist mucus membranes.  Trachea midline, no masses. Cardiovascular: No clubbing, cyanosis, or edema. Respiratory: Normal respiratory effort, no increased work of breathing.  GI: Abdomen is soft, nontender, nondistended, no abdominal masses GU: No CVA tenderness.  Lymph: No cervical or inguinal lymphadenopathy. Skin: No rashes, bruises or suspicious lesions. Neurologic: Grossly intact, no focal deficits, moving all 4 extremities. Psychiatric: Normal mood and affect.  Laboratory Data: Lab Results  Component Value Date   WBC 5.1 11/12/2019   HGB 12.3 (L) 11/12/2019   HCT 36.3 (L) 11/12/2019   MCV 81 11/12/2019   PLT 217 11/12/2019    Lab Results  Component Value Date   CREATININE 1.07 12/07/2019    No results found for: PSA  No results found for: TESTOSTERONE  Lab Results  Component  Value Date   HGBA1C 6.2 (H) 03/06/2020    Urinalysis    Component Value Date/Time   COLORURINE STRAW (A) 02/24/2018 1611   APPEARANCEUR Clear 08/06/2020 1459   LABSPEC 1.004 (L) 02/24/2018 1611   PHURINE 7.0 02/24/2018 1611   GLUCOSEU Negative 08/06/2020 1459   HGBUR SMALL (A) 02/24/2018 1611   BILIRUBINUR Negative 08/06/2020 [place] 02/24/2018 1611   PROTEINUR Negative 08/06/2020 1459   PROTEINUR NEGATIVE 02/24/2018 1611   UROBILINOGEN negative (A) 01/21/2020 1430   NITRITE Negative 08/06/2020 1459   NITRITE NEGATIVE 02/24/2018 1611   LEUKOCYTESUR Negative 08/06/2020 1459    Lab Results  Component Value Date   LABMICR See below: 08/06/2020   WBCUA None seen 08/06/2020   LABEPIT None seen 08/06/2020   BACTERIA None seen 08/06/2020    Pertinent Imaging:  No results found for this or any previous visit.  No results found for this or any previous visit.  No results found for this or any previous visit.  No results found for this or any previous visit.  No results found for this or any previous visit.  No results found for this or any previous visit.  No results found for this or any previous visit.  No results found for this or any previous visit.   Assessment & Plan:    1. Benign prostatic hyperplasia with urinary obstruction -continue rapaflo '8mg'$  and finasteride 5 mg. No fluid within 2 hours of going to bed - Urinalysis, Routine w reflex microscopic - finasteride (PROSCAR) 5 MG tablet; TAKE 1 TABLET (5 MG TOTAL) BY MOUTH DAILY.  Dispense: 30 tablet; Refill: 2 - silodosin (RAPAFLO) 8 MG CAPS capsule; Take 1 capsule (8 mg total) by mouth daily with breakfast.  Dispense: 30 capsule; Refill: 3  2. Malignant neoplasm of prostate ([place]) -RTC 1 year with PSA  3. Weak urinary stream -Continue rapaflo '8mg'$  and finasteride '5mg'$     No follow-ups on file.  Nicolette Bang, MD  [facility] [place]

## 2021-03-31 NOTE — Progress Notes (Signed)
Patient ID: Roy Reed, male    DOB: 09-Aug-1956  MRN: [id number]  CC: Hyperlipidemia   Subjective: Roy Reed is a 65 y.o. male who presents for chronic ds management His concerns today include:  Pt with hx of pre-DM, obesity, HL, rotator cuff tear s/p RT shoulder arthroscopy and prostate CA (hormone and radiation seeds), obesity, ACD, colon polyps, nonobst CAD.   HL: taking and tolerating Lipitor  PreDM/Obesity: eating healthy. Lots of fruits and veggies, less meat.  Drinks mainly water and natural juices that he makes from fresh fruits.  Plays tennis 2 x a wk and walks 1 hr 2 x/wk  Hx of Prostate CA:  has appt with urologist this after.  PSA last wk was 0.01  C/o itching rash on extremities x 2 mths.  No vesicles.  However when it gets to itching he scratches so much that it breaks the skin.  It heals over as black spots. No other family member with rash. Wife changes soap regularly depending on cost and he thinks this may be playing a role. 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 and he has not eaten it in yrs Uses some body oils which help  Patient Active Problem List   Diagnosis Date Noted   Benign prostatic hyperplasia with urinary obstruction 01/21/2020   Normocytic anemia 11/30/2019   Elevated blood pressure reading 11/30/2019   Dyspnea on exertion 11/30/2019   Prediabetes 11/30/2019   Hyperlipidemia 08/15/2018   Obesity (BMI 30-39.9) 08/15/2018   Skin tag 08/15/2018   Malignant neoplasm of prostate ([place]) 10/13/2017   Screening for malignant neoplasm of colon 08/31/2017   Personal history of gastric ulcer 02/09/2017     Current Outpatient Medications on File Prior to Visit  Medication Sig Dispense Refill   atorvastatin (LIPITOR) 10 MG tablet Take 1 tablet (10 mg total) by mouth daily. 30 tablet 2   finasteride (PROSCAR) 5 MG tablet TAKE 1 TABLET (5 MG TOTAL) BY MOUTH DAILY. 30 tablet 2   metFORMIN (GLUCOPHAGE) 500 MG tablet Take 0.5 tablets (250 mg total) by mouth  daily with breakfast. 30 tablet 3   silodosin (RAPAFLO) 8 MG CAPS capsule Take 1 capsule (8 mg total) by mouth daily with breakfast. 30 capsule 3   No current facility-administered medications on file prior to visit.    Allergies  Allergen Reactions   Aspirin     States he can take if he takes acid reducer medicine first    Social History   Socioeconomic History   Marital status: Married    Spouse name: Not on file   Number of children: 6   Years of education: Not on file   Highest education level: Not on file  Occupational History   Occupation: retired  Tobacco Use   Smoking status: Never   Smokeless tobacco: Never  Vaping Use   Vaping Use: Never used  Substance and Sexual Activity   Alcohol use: Not Currently   Drug use: No   Sexual activity: Not on file  Other Topics Concern   Not on file  Social History Narrative   Not on file   Social Determinants of Health   Financial Resource Strain: Not on file  Food Insecurity: Not on file  Transportation Needs: Not on file  Physical Activity: Not on file  Stress: Not on file  Social Connections: Not on file  Intimate Partner Violence: Not on file    No family history on file.  Past Surgical History:  Procedure Laterality Date  COLONOSCOPY     PROSTATE BIOPSY     PROSTATE BIOPSY     RADIOACTIVE SEED IMPLANT N/A 01/02/2018   Procedure: RADIOACTIVE SEED IMPLANT/BRACHYTHERAPY IMPLANT;  Surgeon: Cleon Gustin, MD;  [facility];  Service: Urology;  Laterality: N/A;   SHOULDER ARTHROSCOPY WITH ROTATOR CUFF REPAIR AND SUBACROMIAL DECOMPRESSION Right 08/05/2017   Procedure: RIGHT SHOULDER ARTHROSCOPY WITH ROTATOR CUFF REPAIR, DISTAL CLAVICLE EXCISION, DEBRIDEMENT AND SUBACROMIAL DECOMPRESSION;  Surgeon: Leandrew Koyanagi, MD;  Location: [place];  Service: Orthopedics;  Laterality: Right;   SPACE OAR INSTILLATION N/A 01/02/2018   Procedure: SPACE OAR INSTILLATION;  Surgeon:  Cleon Gustin, MD;  Location: [facility];  Service: Urology;  Laterality: N/A;    ROS: Review of Systems Negative except as stated above  PHYSICAL EXAM: BP 128/82   Pulse 94   Resp 16   Wt 265 lb 6.4 oz (120.4 kg)   SpO2 97%   BMI 34.08 kg/m   Wt Readings from Last 3 Encounters:  03/31/21 265 lb 6.4 oz (120.4 kg)  08/06/20 268 lb (121.6 kg)  03/17/20 256 lb (116.1 kg)    Physical Exam Repeat blood pressure 130/80.  General appearance - alert, well appearing, and in no distress Mental status - normal mood, behavior, speech, dress, motor activity, and thought processes Neck - supple, no significant adenopathy Chest - clear to auscultation, no wheezes, rales or rhonchi, symmetric air entry Heart - normal rate, regular rhythm, normal S1, S2, no murmurs, rubs, clicks or gallops Extremities - peripheral pulses normal, no pedal edema, no clubbing or cyanosis Skin -some excoriation seen on the upper extremities with healed over black spots.  None noted on the hands.  A few eczematoid type hyperpigmented rash noted on both ankles.  CMP Latest Ref Rng & Units 12/07/2019 11/12/2019 12/13/2018  Glucose 65 - 99 mg/dL 106(H) 83 -  BUN 8 - 27 mg/dL 15 16 -  Creatinine 0.76 - 1.27 mg/dL 1.07 0.95 -  Sodium 134 - 144 mmol/L 139 138 -  Potassium 3.5 - 5.2 mmol/L 4.8 4.4 -  Chloride 96 - 106 mmol/L 100 100 -  CO2 20 - 29 mmol/L 22 26 -  Calcium 8.6 - 10.2 mg/dL 9.6 9.3 -  Total Protein 6.0 - 8.5 g/dL - 7.4 7.2  Total Bilirubin 0.0 - 1.2 mg/dL - <0.2 0.3  Alkaline Phos 39 - 117 IU/L - 104 106  AST 0 - 40 IU/L - 32 29  ALT 0 - 44 IU/L - 57(H) 53(H)   Lipid Panel     Component Value Date/Time   CHOL 150 11/12/2019 1706   TRIG 74 11/12/2019 1706   HDL 52 11/12/2019 1706   CHOLHDL 2.9 11/12/2019 1706   LDLCALC 84 11/12/2019 1706    CBC    Component Value Date/Time   WBC 5.1 11/12/2019 1706   WBC 5.2 12/26/2017 0853   RBC 4.48 11/12/2019 1706   RBC 4.77  12/26/2017 0853   HGB 12.3 (L) 11/12/2019 1706   HCT 36.3 (L) 11/12/2019 1706   PLT 217 11/12/2019 1706   MCV 81 11/12/2019 1706   MCH 27.5 11/12/2019 1706   MCH 27.0 12/26/2017 0853   MCHC 33.9 11/12/2019 1706   MCHC 32.7 12/26/2017 0853   RDW 12.9 11/12/2019 1706   LYMPHSABS 2.6 02/28/2017 1651   EOSABS 0.0 02/28/2017 1651   BASOSABS 0.0 02/28/2017 1651    ASSESSMENT AND PLAN: 1. Hyperlipidemia, unspecified hyperlipidemia type Continue atorvastatin.  Due for cholesterol check today. - Lipid panel  2. Prediabetes 3. Obesity (BMI 30-39.9) Commended him on healthy eating habits and regular exercise.  Encouraged him to keep up the good works. - CBC - Comprehensive metabolic panel - Hemoglobin A1c  4. Dermatitis Recommend that he try to use an unscented gentle soap like Dove and use it consistently rather than changing soaps frequently - triamcinolone cream (KENALOG) 0.1 %; Apply 1 application topically 2 (two) times daily.  Dispense: 30 g; Refill: 1  5. Elevated blood-pressure reading without diagnosis of hypertension Inform patient that normal blood pressure is less than 120/80.  Blood pressure has been good on previous visits. DASH diet discussed and encouraged.  Repeat blood pressure on subsequent visit.    Patient was given the opportunity to ask questions.  Patient verbalized understanding of the plan and was able to repeat key elements of the plan.  [employer] Language interpreter used during this encounter. [id number], [place]  No orders of the defined types were placed in this encounter.    Requested Prescriptions    No prescriptions requested or ordered in this encounter    No follow-ups on file.  Karle Plumber, MD, FACP

## 2021-03-31 NOTE — Patient Instructions (Signed)
Try to use the same soap rather than changing soaps often.

## 2021-03-31 NOTE — Progress Notes (Signed)

## 2021-04-01 ENCOUNTER — Ambulatory Visit: Payer: Self-pay | Attending: Internal Medicine

## 2021-04-01 ENCOUNTER — Other Ambulatory Visit: Payer: Self-pay

## 2021-04-01 DIAGNOSIS — C61 Malignant neoplasm of prostate: Secondary | ICD-10-CM

## 2021-04-02 LAB — PSA: Prostate Specific Ag, Serum: <0.1 ng/mL (ref 0.0–4.0)

## 2021-04-08 NOTE — Progress Notes (Signed)
Results sent via my chart 

## 2021-04-10 ENCOUNTER — Encounter: Payer: Self-pay | Admitting: Internal Medicine

## 2021-04-22 ENCOUNTER — Ambulatory Visit: Payer: Self-pay | Attending: Internal Medicine

## 2021-04-22 ENCOUNTER — Other Ambulatory Visit: Payer: Self-pay

## 2021-04-22 DIAGNOSIS — E669 Obesity, unspecified: Secondary | ICD-10-CM

## 2021-04-22 DIAGNOSIS — E785 Hyperlipidemia, unspecified: Secondary | ICD-10-CM

## 2021-04-22 DIAGNOSIS — R7303 Prediabetes: Secondary | ICD-10-CM

## 2021-04-23 LAB — LIPID PANEL
Chol/HDL Ratio: 3.1 ratio (ref 0.0–5.0)
Cholesterol, Total: 154 mg/dL (ref 100–199)
HDL: 49 mg/dL (ref >39)
LDL Chol Calc (NIH): 95 mg/dL (ref 0–99)
Triglycerides: 49 mg/dL (ref 0–149)
VLDL Cholesterol Cal: 10 mg/dL (ref 5–40)

## 2021-04-23 LAB — COMPREHENSIVE METABOLIC PANEL
ALT: 36 IU/L (ref 0–44)
AST: 22 IU/L (ref 0–40)
Albumin/Globulin Ratio: 1.2 (ref 1.2–2.2)
Albumin: 4.2 g/dL (ref 3.8–4.8)
Alkaline Phosphatase: 91 IU/L (ref 44–121)
BUN/Creatinine Ratio: 12 (ref 10–24)
BUN: 13 mg/dL (ref 8–27)
Bilirubin Total: 0.4 mg/dL (ref 0.0–1.2)
CO2: 26 mmol/L (ref 20–29)
Calcium: 9.2 mg/dL (ref 8.6–10.2)
Chloride: 100 mmol/L (ref 96–106)
Creatinine, Ser: 1.11 mg/dL (ref 0.76–1.27)
Globulin, Total: 3.4 g/dL (ref 1.5–4.5)
Glucose: 77 mg/dL (ref 65–99)
Potassium: 4.7 mmol/L (ref 3.5–5.2)
Sodium: 140 mmol/L (ref 134–144)
Total Protein: 7.6 g/dL (ref 6.0–8.5)
eGFR: 73 mL/min/{1.73_m2} (ref >59)

## 2021-04-23 LAB — CBC
Hematocrit: 39.8 % (ref 37.5–51.0)
Hemoglobin: 13.1 g/dL (ref 13.0–17.7)
MCH: 26.6 pg (ref 26.6–33.0)
MCHC: 32.9 g/dL (ref 31.5–35.7)
MCV: 81 fL (ref 79–97)
Platelets: 234 10*3/uL (ref 150–450)
RBC: 4.92 x10E6/uL (ref 4.14–5.80)
RDW: 12.5 % (ref 11.6–15.4)
WBC: 4.7 10*3/uL (ref 3.4–10.8)

## 2021-04-23 LAB — HEMOGLOBIN A1C
Est. average glucose Bld gHb Est-mCnc: 131 mg/dL
Hgb A1c MFr Bld: 6.2 % — ABNORMAL HIGH (ref 4.8–5.6)

## 2021-04-29 ENCOUNTER — Other Ambulatory Visit: Payer: Self-pay

## 2021-04-30 ENCOUNTER — Other Ambulatory Visit: Payer: Self-pay

## 2021-05-27 ENCOUNTER — Other Ambulatory Visit: Payer: Self-pay

## 2021-05-28 ENCOUNTER — Other Ambulatory Visit: Payer: Self-pay

## 2021-06-25 ENCOUNTER — Other Ambulatory Visit: Payer: Self-pay | Admitting: Critical Care Medicine

## 2021-06-26 ENCOUNTER — Other Ambulatory Visit: Payer: Self-pay

## 2021-06-26 MED ORDER — ATORVASTATIN CALCIUM 10 MG PO TABS
10.0000 mg | ORAL_TABLET | Freq: Every day | ORAL | 2 refills | Status: DC
  Filled 2021-06-26: qty 30, 30d supply, fill #0
  Filled 2021-07-27 – 2021-07-28 (×2): qty 30, 30d supply, fill #1
  Filled 2021-08-26 – 2021-08-27 (×2): qty 30, 30d supply, fill #2

## 2021-06-26 NOTE — Telephone Encounter (Signed)
Requested Prescriptions  Pending Prescriptions Disp Refills  . atorvastatin (LIPITOR) 10 MG tablet 30 tablet 2    Sig: Take 1 tablet (10 mg total) by mouth daily.     Cardiovascular:  Antilipid - Statins Passed - 06/25/2021 11:33 PM      Passed - Total Cholesterol in normal range and within 360 days    Cholesterol, Total  Date Value Ref Range Status  04/22/2021 154 100 - 199 mg/dL Final         Passed - LDL in normal range and within 360 days    LDL Chol Calc (NIH)  Date Value Ref Range Status  04/22/2021 95 0 - 99 mg/dL Final         Passed - HDL in normal range and within 360 days    HDL  Date Value Ref Range Status  04/22/2021 49 >39 mg/dL Final         Passed - Triglycerides in normal range and within 360 days    Triglycerides  Date Value Ref Range Status  04/22/2021 49 0 - 149 mg/dL Final         Passed - Patient is not pregnant      Passed - Valid encounter within last 12 months    Recent Outpatient Visits          2 months ago Hyperlipidemia, unspecified hyperlipidemia type   [place], Deborah B, MD   3 months ago Benign prostatic hyperplasia with urinary obstruction   [place], Patrick E, MD   1 year ago Prediabetes   [place], Deborah B, MD   1 year ago Prediabetes   [place], Deborah B, MD   1 year ago Need for Tdap vaccination   [place], Jarome Matin, RPH-CPP      Future Appointments            In 1 month Wynetta Emery, [name], MD [place]   In 9 months McKenzie, Candee Furbish, MD [place]

## 2021-07-27 ENCOUNTER — Other Ambulatory Visit: Payer: Self-pay | Admitting: Urology

## 2021-07-27 DIAGNOSIS — N138 Other obstructive and reflux uropathy: Secondary | ICD-10-CM

## 2021-07-27 DIAGNOSIS — N401 Enlarged prostate with lower urinary tract symptoms: Secondary | ICD-10-CM

## 2021-07-28 ENCOUNTER — Other Ambulatory Visit: Payer: Self-pay

## 2021-08-03 ENCOUNTER — Ambulatory Visit: Payer: Self-pay | Admitting: Internal Medicine

## 2021-08-11 ENCOUNTER — Other Ambulatory Visit: Payer: Self-pay

## 2021-08-11 MED ORDER — FINASTERIDE 5 MG PO TABS
ORAL_TABLET | Freq: Every day | ORAL | 2 refills | Status: DC
  Filled 2021-08-11: qty 30, 30d supply, fill #0
  Filled 2021-08-26: qty 30, 30d supply, fill #1
  Filled 2021-09-25: qty 30, 30d supply, fill #2
  Filled 2021-09-25: qty 30, 30d supply, fill #0

## 2021-08-12 ENCOUNTER — Other Ambulatory Visit: Payer: Self-pay

## 2021-08-18 ENCOUNTER — Encounter: Payer: Self-pay | Admitting: Internal Medicine

## 2021-08-26 ENCOUNTER — Other Ambulatory Visit: Payer: Self-pay | Admitting: Urology

## 2021-08-26 DIAGNOSIS — N401 Enlarged prostate with lower urinary tract symptoms: Secondary | ICD-10-CM

## 2021-08-27 ENCOUNTER — Other Ambulatory Visit: Payer: Self-pay

## 2021-08-27 MED ORDER — SILODOSIN 8 MG PO CAPS
8.0000 mg | ORAL_CAPSULE | Freq: Every day | ORAL | 3 refills | Status: DC
  Filled 2021-08-27: qty 30, 30d supply, fill #0
  Filled 2021-09-25: qty 30, 30d supply, fill #1
  Filled 2021-09-25: qty 30, 30d supply, fill #0
  Filled 2021-10-27: qty 30, 30d supply, fill #1
  Filled 2021-11-17: qty 30, 30d supply, fill #2

## 2021-09-01 ENCOUNTER — Other Ambulatory Visit: Payer: Self-pay

## 2021-09-01 ENCOUNTER — Encounter: Payer: Self-pay | Admitting: Internal Medicine

## 2021-09-01 ENCOUNTER — Ambulatory Visit: Payer: Self-pay | Attending: Internal Medicine | Admitting: Internal Medicine

## 2021-09-01 VITALS — BP 132/82 | HR 90 | Ht 74.5 in | Wt 261.0 lb

## 2021-09-01 DIAGNOSIS — E669 Obesity, unspecified: Secondary | ICD-10-CM

## 2021-09-01 DIAGNOSIS — R7303 Prediabetes: Secondary | ICD-10-CM

## 2021-09-01 DIAGNOSIS — R030 Elevated blood-pressure reading, without diagnosis of hypertension: Secondary | ICD-10-CM

## 2021-09-01 DIAGNOSIS — Z23 Encounter for immunization: Secondary | ICD-10-CM

## 2021-09-01 DIAGNOSIS — E782 Mixed hyperlipidemia: Secondary | ICD-10-CM

## 2021-09-01 NOTE — Progress Notes (Signed)
Patient ID: Roy Reed, male    DOB: 03/11/1956  MRN: [id number]  CC: Chronic disease management.  Subjective: Roy Reed is a 65 y.o. male who presents for chronic disease management. His concerns today include:  Pt with hx of pre-DM, obesity, HL, rotator cuff tear s/p RT shoulder arthroscopy and prostate CA (hormone and radiation seeds), obesity,  colon polyps, nonobst CAD.   Elev BP:  BP in preHTN range on last visit.  DASH diet advised.  Pt states he has been doing good with this.  Drank a cup of coffee this a.m.   PreDM/obesity/HL: pt was exercising 2x/wk playing tennis and walking.  He has not been doing so recently due to cold weather. Doing okay with eating habits but reports he has been eating more cheese recently -wgh down 4 lbs since last visit  03/2021. -taking and tolerating Metformin and Lipitor  HM:  Due for flu shot and PCV 23. Patient Active Problem List   Diagnosis Date Noted   Benign prostatic hyperplasia with urinary obstruction 01/21/2020   Normocytic anemia 11/30/2019   Elevated blood pressure reading 11/30/2019   Dyspnea on exertion 11/30/2019   Prediabetes 11/30/2019   Hyperlipidemia 08/15/2018   Obesity (BMI 30-39.9) 08/15/2018   Skin tag 08/15/2018   Malignant neoplasm of prostate ([place]) 10/13/2017   Screening for malignant neoplasm of colon 08/31/2017   Personal history of gastric ulcer 02/09/2017     Current Outpatient Medications on File Prior to Visit  Medication Sig Dispense Refill   atorvastatin (LIPITOR) 10 MG tablet Take 1 tablet (10 mg total) by mouth daily. 30 tablet 2   finasteride (PROSCAR) 5 MG tablet TAKE 1 TABLET (5 MG TOTAL) BY MOUTH DAILY. 30 tablet 2   metFORMIN (GLUCOPHAGE) 500 MG tablet Take 0.5 tablets (250 mg total) by mouth daily with breakfast. 30 tablet 3   silodosin (RAPAFLO) 8 MG CAPS capsule Take 1 capsule (8 mg total) by mouth daily with breakfast. 30 capsule 3   triamcinolone cream (KENALOG) 0.1 % Apply 1  application topically 2 (two) times daily. (Patient not taking: Reported on 09/01/2021) 30 g 1   No current facility-administered medications on file prior to visit.    Allergies  Allergen Reactions   Aspirin     States he can take if he takes acid reducer medicine first    Social History   Socioeconomic History   Marital status: Married    Spouse name: Not on file   Number of children: 6   Years of education: Not on file   Highest education level: Not on file  Occupational History   Occupation: retired  Tobacco Use   Smoking status: Never   Smokeless tobacco: Never  Vaping Use   Vaping Use: Never used  Substance and Sexual Activity   Alcohol use: Not Currently   Drug use: No   Sexual activity: Not on file  Other Topics Concern   Not on file  Social History Narrative   Not on file   Social Determinants of Health   Financial Resource Strain: Not on file  Food Insecurity: Not on file  Transportation Needs: Not on file  Physical Activity: Not on file  Stress: Not on file  Social Connections: Not on file  Intimate Partner Violence: Not on file    No family history on file.  Past Surgical History:  Procedure Laterality Date   COLONOSCOPY     PROSTATE BIOPSY     PROSTATE BIOPSY  RADIOACTIVE SEED IMPLANT N/A 01/02/2018   Procedure: RADIOACTIVE SEED IMPLANT/BRACHYTHERAPY IMPLANT;  Surgeon: Cleon Gustin, MD;  Location: [facility];  Service: Urology;  Laterality: N/A;   SHOULDER ARTHROSCOPY WITH ROTATOR CUFF REPAIR AND SUBACROMIAL DECOMPRESSION Right 08/05/2017   Procedure: RIGHT SHOULDER ARTHROSCOPY WITH ROTATOR CUFF REPAIR, DISTAL CLAVICLE EXCISION, DEBRIDEMENT AND SUBACROMIAL DECOMPRESSION;  Surgeon: Leandrew Koyanagi, MD;  Location: [place];  Service: Orthopedics;  Laterality: Right;   SPACE OAR INSTILLATION N/A 01/02/2018   Procedure: SPACE OAR INSTILLATION;  Surgeon: Cleon Gustin, MD;  Location: [facility];  Service: Urology;  Laterality: N/A;    ROS: Review of Systems Negative except as stated above  PHYSICAL EXAM: BP 132/82    Pulse 90    Ht 6' 2.5" (1.892 m)    Wt 261 lb (118.4 kg)    SpO2 99%    BMI 33.06 kg/m   Wt Readings from Last 3 Encounters:  09/01/21 261 lb (118.4 kg)  03/31/21 265 lb 6.4 oz (120.4 kg)  08/06/20 268 lb (121.6 kg)    Physical Exam  General appearance - alert, well appearing, and in no distress Mental status - normal mood, behavior, speech, dress, motor activity, and thought processes Neck - supple, no significant adenopathy Chest - clear to auscultation, no wheezes, rales or rhonchi, symmetric air entry Heart - normal rate, regular rhythm, normal S1, S2, no murmurs, rubs, clicks or gallops Extremities - peripheral pulses normal, no pedal edema, no clubbing or cyanosis  CMP Latest Ref Rng & Units 04/22/2021 12/07/2019 11/12/2019  Glucose 65 - 99 mg/dL 77 106(H) 83  BUN 8 - 27 mg/dL 13 15 16   Creatinine 0.76 - 1.27 mg/dL 1.11 1.07 0.95  Sodium 134 - 144 mmol/L 140 139 138  Potassium 3.5 - 5.2 mmol/L 4.7 4.8 4.4  Chloride 96 - 106 mmol/L 100 100 100  CO2 20 - 29 mmol/L 26 22 26   Calcium 8.6 - 10.2 mg/dL 9.2 9.6 9.3  Total Protein 6.0 - 8.5 g/dL 7.6 - 7.4  Total Bilirubin 0.0 - 1.2 mg/dL 0.4 - <0.2  Alkaline Phos 44 - 121 IU/L 91 - 104  AST 0 - 40 IU/L 22 - 32  ALT 0 - 44 IU/L 36 - 57(H)   Lipid Panel     Component Value Date/Time   CHOL 154 04/22/2021 1458   TRIG 49 04/22/2021 1458   HDL 49 04/22/2021 1458   CHOLHDL 3.1 04/22/2021 1458   LDLCALC 95 04/22/2021 1458    CBC    Component Value Date/Time   WBC 4.7 04/22/2021 1458   WBC 5.2 12/26/2017 0853   RBC 4.92 04/22/2021 1458   RBC 4.77 12/26/2017 0853   HGB 13.1 04/22/2021 1458   HCT 39.8 04/22/2021 1458   PLT 234 04/22/2021 1458   MCV 81 04/22/2021 1458   MCH 26.6 04/22/2021 1458   MCH 27.0 12/26/2017 0853   MCHC 32.9 04/22/2021 1458   MCHC 32.7 12/26/2017 0853   RDW 12.5  04/22/2021 1458   LYMPHSABS 2.6 02/28/2017 1651   EOSABS 0.0 02/28/2017 1651   BASOSABS 0.0 02/28/2017 1651    ASSESSMENT AND PLAN: 1. Obesity (BMI 30-39.9) 2. Prediabetes Encouraged him to continue healthy eating habits. Discussed ways he can exercise at home without having to go outdoors like walking in place for 30 minutes.  3. Mixed hyperlipidemia Continue atorvastatin.  4. Elevated blood pressure reading Continue DASH diet.  Has access to wrist device and  will check blood pressure twice a week and record the readings.  We will have him follow-up with clinical pharmacist in 1 month for repeat blood pressure check.  5. Need for influenza vaccination Given flu shot today.  6. Need for vaccination against Streptococcus pneumoniae Given Prevnar 23 today.     Patient was given the opportunity to ask questions.  Patient verbalized understanding of the plan and was able to repeat key elements of the plan.  [employer] Language interpreter used during this encounter. #[id number] Dibbles  Orders Placed This Encounter  Procedures   Flu Vaccine QUAD 37mo+IM (Fluarix, Fluzone & Alfiuria Quad PF)     Requested Prescriptions    No prescriptions requested or ordered in this encounter    Return in about 4 months (around 12/31/2021) for Appt with [facility] in 4 wks for BP check.  Karle Plumber, MD, FACP

## 2021-09-25 ENCOUNTER — Other Ambulatory Visit: Payer: Self-pay

## 2021-09-25 ENCOUNTER — Other Ambulatory Visit: Payer: Self-pay | Admitting: Internal Medicine

## 2021-09-25 MED ORDER — ATORVASTATIN CALCIUM 10 MG PO TABS
10.0000 mg | ORAL_TABLET | Freq: Every day | ORAL | 2 refills | Status: DC
  Filled 2021-09-25 – 2021-10-05 (×2): qty 30, 30d supply, fill #0
  Filled 2021-10-27: qty 30, 30d supply, fill #1
  Filled 2021-11-17: qty 30, 30d supply, fill #2

## 2021-09-25 NOTE — Telephone Encounter (Signed)
Requested Prescriptions  Pending Prescriptions Disp Refills   atorvastatin (LIPITOR) 10 MG tablet 30 tablet 2    Sig: Take 1 tablet (10 mg total) by mouth daily.     Cardiovascular:  Antilipid - Statins Passed - 09/25/2021  8:56 AM      Passed - Total Cholesterol in normal range and within 360 days    Cholesterol, Total  Date Value Ref Range Status  04/22/2021 154 100 - 199 mg/dL Final         Passed - LDL in normal range and within 360 days    LDL Chol Calc (NIH)  Date Value Ref Range Status  04/22/2021 95 0 - 99 mg/dL Final         Passed - HDL in normal range and within 360 days    HDL  Date Value Ref Range Status  04/22/2021 49 >39 mg/dL Final         Passed - Triglycerides in normal range and within 360 days    Triglycerides  Date Value Ref Range Status  04/22/2021 49 0 - 149 mg/dL Final         Passed - Patient is not pregnant      Passed - Valid encounter within last 12 months    Recent Outpatient Visits          3 weeks ago Obesity (BMI 30-39.9)   [place], MD   5 months ago Hyperlipidemia, unspecified hyperlipidemia type   [place], MD   6 months ago Benign prostatic hyperplasia with urinary obstruction   [place] Elsie Stain, MD   1 year ago Prediabetes   [place], MD   1 year ago Prediabetes   [place], MD      Future Appointments            In 6 months McKenzie, Candee Furbish, MD [place]

## 2021-09-26 NOTE — Telephone Encounter (Signed)
Last RF 09/25/21 by Dr Wynetta Emery  Requested Prescriptions  Refused Prescriptions Disp Refills   atorvastatin (LIPITOR) 10 MG tablet 30 tablet 2    Sig: Take 1 tablet (10 mg total) by mouth daily.     Cardiovascular:  Antilipid - Statins Passed - 09/25/2021  5:16 PM      Passed - Total Cholesterol in normal range and within 360 days    Cholesterol, Total  Date Value Ref Range Status  04/22/2021 154 100 - 199 mg/dL Final         Passed - LDL in normal range and within 360 days    LDL Chol Calc (NIH)  Date Value Ref Range Status  04/22/2021 95 0 - 99 mg/dL Final         Passed - HDL in normal range and within 360 days    HDL  Date Value Ref Range Status  04/22/2021 49 >39 mg/dL Final         Passed - Triglycerides in normal range and within 360 days    Triglycerides  Date Value Ref Range Status  04/22/2021 49 0 - 149 mg/dL Final         Passed - Patient is not pregnant      Passed - Valid encounter within last 12 months    Recent Outpatient Visits          3 weeks ago Obesity (BMI 30-39.9)   [place], MD   5 months ago Hyperlipidemia, unspecified hyperlipidemia type   [place], MD   6 months ago Benign prostatic hyperplasia with urinary obstruction   [place] Elsie Stain, MD   1 year ago Prediabetes   [place], MD   1 year ago Prediabetes   [place], MD      Future Appointments            In 6 months McKenzie, Candee Furbish, MD [place]

## 2021-09-28 ENCOUNTER — Other Ambulatory Visit: Payer: Self-pay

## 2021-10-05 ENCOUNTER — Other Ambulatory Visit: Payer: Self-pay

## 2021-10-27 ENCOUNTER — Other Ambulatory Visit: Payer: Self-pay | Admitting: Urology

## 2021-10-27 DIAGNOSIS — N138 Other obstructive and reflux uropathy: Secondary | ICD-10-CM

## 2021-10-28 ENCOUNTER — Other Ambulatory Visit: Payer: Self-pay

## 2021-11-05 ENCOUNTER — Other Ambulatory Visit: Payer: Self-pay

## 2021-11-05 MED ORDER — FINASTERIDE 5 MG PO TABS
ORAL_TABLET | Freq: Every day | ORAL | 2 refills | Status: DC
  Filled 2021-11-05: qty 30, 30d supply, fill #0
  Filled 2021-11-17: qty 30, 30d supply, fill #1
  Filled 2022-01-09: qty 30, 30d supply, fill #2

## 2021-11-09 ENCOUNTER — Other Ambulatory Visit: Payer: Self-pay

## 2021-11-17 ENCOUNTER — Encounter: Payer: Self-pay | Admitting: Internal Medicine

## 2021-11-17 ENCOUNTER — Other Ambulatory Visit: Payer: Self-pay | Admitting: Critical Care Medicine

## 2021-11-17 ENCOUNTER — Other Ambulatory Visit: Payer: Self-pay | Admitting: Internal Medicine

## 2021-11-17 MED ORDER — METFORMIN HCL 500 MG PO TABS
250.0000 mg | ORAL_TABLET | Freq: Every day | ORAL | 3 refills | Status: DC
  Filled 2021-11-17 – 2021-12-03 (×2): qty 45, 90d supply, fill #0
  Filled 2022-02-21: qty 45, 90d supply, fill #1
  Filled 2022-03-30: qty 45, 90d supply, fill #2
  Filled 2022-06-24 (×2): qty 45, 90d supply, fill #3

## 2021-11-17 MED ORDER — ATORVASTATIN CALCIUM 10 MG PO TABS
10.0000 mg | ORAL_TABLET | Freq: Every day | ORAL | 1 refills | Status: DC
  Filled 2021-11-17 – 2021-12-03 (×2): qty 90, 90d supply, fill #0
  Filled 2022-02-21: qty 90, 90d supply, fill #1

## 2021-11-18 ENCOUNTER — Other Ambulatory Visit: Payer: Self-pay

## 2021-12-03 ENCOUNTER — Other Ambulatory Visit: Payer: Self-pay

## 2021-12-07 ENCOUNTER — Other Ambulatory Visit: Payer: Self-pay

## 2021-12-26 ENCOUNTER — Other Ambulatory Visit: Payer: Self-pay | Admitting: Physician Assistant

## 2021-12-26 DIAGNOSIS — N401 Enlarged prostate with lower urinary tract symptoms: Secondary | ICD-10-CM

## 2021-12-28 ENCOUNTER — Other Ambulatory Visit: Payer: Self-pay

## 2021-12-28 MED ORDER — SILODOSIN 8 MG PO CAPS
8.0000 mg | ORAL_CAPSULE | Freq: Every day | ORAL | 3 refills | Status: DC
  Filled 2021-12-28: qty 30, 30d supply, fill #0
  Filled 2022-01-26: qty 30, 30d supply, fill #1
  Filled 2022-02-21: qty 30, 30d supply, fill #2
  Filled 2022-03-30: qty 30, 30d supply, fill #3

## 2022-01-11 ENCOUNTER — Other Ambulatory Visit: Payer: Self-pay

## 2022-01-26 ENCOUNTER — Other Ambulatory Visit: Payer: Self-pay

## 2022-01-27 ENCOUNTER — Other Ambulatory Visit: Payer: Self-pay

## 2022-02-10 ENCOUNTER — Other Ambulatory Visit: Payer: Self-pay | Admitting: Urology

## 2022-02-10 DIAGNOSIS — N401 Enlarged prostate with lower urinary tract symptoms: Secondary | ICD-10-CM

## 2022-02-11 ENCOUNTER — Other Ambulatory Visit: Payer: Self-pay

## 2022-02-11 MED ORDER — FINASTERIDE 5 MG PO TABS
ORAL_TABLET | Freq: Every day | ORAL | 2 refills | Status: DC
  Filled 2022-02-11: qty 30, 30d supply, fill #0
  Filled 2022-02-21: qty 30, 30d supply, fill #1
  Filled 2022-03-30: qty 30, 30d supply, fill #2

## 2022-02-12 ENCOUNTER — Other Ambulatory Visit: Payer: Self-pay | Admitting: Pharmacist

## 2022-02-12 ENCOUNTER — Other Ambulatory Visit: Payer: Self-pay

## 2022-02-12 NOTE — Chronic Care Management (AMB) (Signed)
Patient seen by Camila Li, PharmD Candidate on 02/12/22 while they were picking up prescriptions at [place] at [facility].   Patient has an automated home blood pressure machine, but they are not typically checking at home. They are not on any antihypertensive medications at baseline.   Medication review was performed. They are taking medications as prescribed.   The following barriers to adherence were noted: - Denies concerns with medication access or understanding.  The following interventions were completed:  - Patient was educated on medications, including indication and administration  The patient has follow up scheduled:  PCP: none - encouraged to contact Dr. Durenda Age office to schedule Urology: 04/02/22  Catie Hedwig Morton, PharmD, [place] [phone]

## 2022-02-22 ENCOUNTER — Other Ambulatory Visit: Payer: Self-pay

## 2022-02-23 ENCOUNTER — Other Ambulatory Visit: Payer: Self-pay

## 2022-03-05 ENCOUNTER — Ambulatory Visit
Admission: RE | Admit: 2022-03-05 | Discharge: 2022-03-05 | Disposition: A | Discharge status: Home / Self Care | Payer: Self-pay | Source: Ambulatory Visit | Attending: Internal Medicine | Admitting: Internal Medicine

## 2022-03-05 ENCOUNTER — Ambulatory Visit: Payer: Self-pay | Attending: Internal Medicine | Admitting: Internal Medicine

## 2022-03-05 ENCOUNTER — Other Ambulatory Visit: Payer: Self-pay

## 2022-03-05 ENCOUNTER — Encounter: Payer: Self-pay | Admitting: Internal Medicine

## 2022-03-05 VITALS — BP 125/80 | HR 80 | Wt 264.4 lb

## 2022-03-05 DIAGNOSIS — M79672 Pain in left foot: Secondary | ICD-10-CM

## 2022-03-05 DIAGNOSIS — E782 Mixed hyperlipidemia: Secondary | ICD-10-CM

## 2022-03-05 DIAGNOSIS — Z8546 Personal history of malignant neoplasm of prostate: Secondary | ICD-10-CM

## 2022-03-05 DIAGNOSIS — E669 Obesity, unspecified: Secondary | ICD-10-CM

## 2022-03-05 DIAGNOSIS — R7303 Prediabetes: Secondary | ICD-10-CM

## 2022-03-05 MED ORDER — DICLOFENAC SODIUM 1 % EX GEL
2.0000 g | Freq: Four times a day (QID) | CUTANEOUS | 0 refills | Status: DC
  Filled 2022-03-05: qty 100, 7d supply, fill #0

## 2022-03-05 NOTE — Progress Notes (Signed)
Patient ID: Roy Reed, male    DOB: 1956/06/18  MRN: [id number]  CC: Foot injury and chronic disease management  Subjective: Roy Reed is a 66 y.o. male who presents for left foot injury and chronic disease management His concerns today include:  Pt with hx of pre-DM, obesity, HL, rotator cuff tear s/p RT shoulder arthroscopy and prostate CA (hormone and radiation seeds), obesity,  colon polyps, nonobst CAD.    Patient complains of pain on the heel of the left foot medial aspect x3 weeks.  Pain started while he was playing tennis 3 weeks ago.  He felt a strong pain.  He decided to stop playing tennis for 3 days and did some warm soaks to the foot.  There was no swelling or bruising.  He tried to resume playing tennis after 3 days but the pain was too strong.  Hurts when he walks.  He rates pain as 7/10.  Not taking any oral pain medication.  HL: Reports compliance with atorvastatin.  Tolerating the medication without muscle cramps or aches.  Obesity/prediabetes: He feels he is gaining weight.  His weight actually has remained stable since July of last year.  He would like to lose weight.  He tries to do well with his eating habits.  He was playing tennis but has not been able to do so for the past 3 weeks due to the injury to his left foot.  History of prostate CA: He has an appointment coming up next month with Dr. Alyson Ingles.  Would like to have PSA check in preparation for that appointment. Patient Active Problem List   Diagnosis Date Noted   Benign prostatic hyperplasia with urinary obstruction 01/21/2020   Normocytic anemia 11/30/2019   Elevated blood pressure reading 11/30/2019   Dyspnea on exertion 11/30/2019   Prediabetes 11/30/2019   Hyperlipidemia 08/15/2018   Obesity (BMI 30-39.9) 08/15/2018   Skin tag 08/15/2018   Malignant neoplasm of prostate ([place]) 10/13/2017   Screening for malignant neoplasm of colon 08/31/2017   Personal history of gastric ulcer 02/09/2017      Current Outpatient Medications on File Prior to Visit  Medication Sig Dispense Refill   atorvastatin (LIPITOR) 10 MG tablet Take 1 tablet (10 mg total) by mouth daily. 90 tablet 1   finasteride (PROSCAR) 5 MG tablet TAKE 1 TABLET (5 MG TOTAL) BY MOUTH DAILY. 30 tablet 2   metFORMIN (GLUCOPHAGE) 500 MG tablet Take 0.5 tablets (250 mg total) by mouth daily with breakfast. 45 tablet 3   silodosin (RAPAFLO) 8 MG CAPS capsule Take 1 capsule (8 mg total) by mouth daily with breakfast. 30 capsule 3   triamcinolone cream (KENALOG) 0.1 % Apply 1 application topically 2 (two) times daily. (Patient not taking: Reported on 09/01/2021) 30 g 1   No current facility-administered medications on file prior to visit.    Allergies  Allergen Reactions   Aspirin     States he can take if he takes acid reducer medicine first    Social History   Socioeconomic History   Marital status: Married    Spouse name: Not on file   Number of children: 6   Years of education: Not on file   Highest education level: Not on file  Occupational History   Occupation: retired  Tobacco Use   Smoking status: Never   Smokeless tobacco: Never  Vaping Use   Vaping Use: Never used  Substance and Sexual Activity   Alcohol use: Not Currently   Drug  use: No   Sexual activity: Not on file  Other Topics Concern   Not on file  Social History Narrative   Not on file   Social Determinants of Health   Financial Resource Strain: Not on file  Food Insecurity: Not on file  Transportation Needs: Not on file  Physical Activity: Not on file  Stress: Not on file  Social Connections: Not on file  Intimate Partner Violence: Not on file    No family history on file.  Past Surgical History:  Procedure Laterality Date   COLONOSCOPY     PROSTATE BIOPSY     PROSTATE BIOPSY     RADIOACTIVE SEED IMPLANT N/A 01/02/2018   Procedure: RADIOACTIVE SEED IMPLANT/BRACHYTHERAPY IMPLANT;  Surgeon: Cleon Gustin, MD;   Location: [facility];  Service: Urology;  Laterality: N/A;   SHOULDER ARTHROSCOPY WITH ROTATOR CUFF REPAIR AND SUBACROMIAL DECOMPRESSION Right 08/05/2017   Procedure: RIGHT SHOULDER ARTHROSCOPY WITH ROTATOR CUFF REPAIR, DISTAL CLAVICLE EXCISION, DEBRIDEMENT AND SUBACROMIAL DECOMPRESSION;  Surgeon: Leandrew Koyanagi, MD;  Location: [facility];  Service: Orthopedics;  Laterality: Right;   SPACE OAR INSTILLATION N/A 01/02/2018   Procedure: SPACE OAR INSTILLATION;  Surgeon: Cleon Gustin, MD;  Location: [facility];  Service: Urology;  Laterality: N/A;    ROS: Review of Systems Negative except as stated above  PHYSICAL EXAM: BP 125/80   Pulse 80   Wt 264 lb 6.4 oz (119.9 kg)   SpO2 92%   BMI 33.49 kg/m   Wt Readings from Last 3 Encounters:  03/05/22 264 lb 6.4 oz (119.9 kg)  09/01/21 261 lb (118.4 kg)  03/31/21 265 lb 6.4 oz (120.4 kg)    Physical Exam  General appearance - alert, well appearing, and in no distress Mental status - normal mood, behavior, speech, dress, motor activity, and thought processes Neck - supple, no significant adenopathy Chest - clear to auscultation, no wheezes, rales or rhonchi, symmetric air entry Heart - normal rate, regular rhythm, normal S1, S2, no murmurs, rubs, clicks or gallops Musculoskeletal -left foot: No edema or erythema noted.  He has good range of motion of the ankle joint.  Mild tenderness on palpation of the medial aspect of the heel. Extremities - peripheral pulses normal, no pedal edema, no clubbing or cyanosis      Latest Ref Rng & Units 04/22/2021    2:58 PM 12/07/2019   12:23 PM 11/12/2019    5:06 PM  CMP  Glucose 65 - 99 mg/dL 77  106  83   BUN 8 - 27 mg/dL '13  15  16   '$ Creatinine 0.76 - 1.27 mg/dL 1.11  1.07  0.95   Sodium 134 - 144 mmol/L 140  139  138   Potassium 3.5 - 5.2 mmol/L 4.7  4.8  4.4   Chloride 96 - 106 mmol/L 100  100  100   CO2 20 - 29 mmol/L '26  22  26   '$ Calcium 8.6 -  10.2 mg/dL 9.2  9.6  9.3   Total Protein 6.0 - 8.5 g/dL 7.6   7.4   Total Bilirubin 0.0 - 1.2 mg/dL 0.4   <0.2   Alkaline Phos 44 - 121 IU/L 91   104   AST 0 - 40 IU/L 22   32   ALT 0 - 44 IU/L 36   57    Lipid Panel     Component Value Date/Time   CHOL 154 04/22/2021 1458  TRIG 49 04/22/2021 1458   HDL 49 04/22/2021 1458   CHOLHDL 3.1 04/22/2021 1458   LDLCALC 95 04/22/2021 1458    CBC    Component Value Date/Time   WBC 4.7 04/22/2021 1458   WBC 5.2 12/26/2017 0853   RBC 4.92 04/22/2021 1458   RBC 4.77 12/26/2017 0853   HGB 13.1 04/22/2021 1458   HCT 39.8 04/22/2021 1458   PLT 234 04/22/2021 1458   MCV 81 04/22/2021 1458   MCH 26.6 04/22/2021 1458   MCH 27.0 12/26/2017 0853   MCHC 32.9 04/22/2021 1458   MCHC 32.7 12/26/2017 0853   RDW 12.5 04/22/2021 1458   LYMPHSABS 2.6 02/28/2017 1651   EOSABS 0.0 02/28/2017 1651   BASOSABS 0.0 02/28/2017 1651    ASSESSMENT AND PLAN:  1. Pain of left heel Differential diagnoses include a hairline fracture to the heel versus tendon injury. He will hold off on playing tennis. We will get an x-ray of the left foot.  He does not tolerate NSAIDs due to GI upset so I will give Voltaren gel for him to use topically.  Recommend warm soaks and also rubbing the heel back-and-forth over a tennis ball when sitting down.  We will get him to podiatry. - Ambulatory referral to Podiatry - DG Foot Complete Left; Future - diclofenac Sodium (VOLTAREN) 1 % GEL; Apply 2 g topically 4 (four) times daily.  Dispense: 100 g; Refill: 0  2. Prediabetes 3. Obesity (BMI 30-39.9) Patient advised to eliminate sugary drinks from the diet, cut back on portion sizes especially of white carbohydrates, eat more white lean meat like chicken [place] and seafood instead of beef or pork and incorporate fresh fruits and vegetables into the diet daily.  - CBC - Comprehensive metabolic panel - Hemoglobin A1c  4. Mixed hyperlipidemia Continue atorvastatin - Lipid  panel  5. History of prostate cancer Patient requested PSA check in preparation for his upcoming appointment with Dr. Alyson Ingles next month. - PSA  AMN Language interpreter used during this encounter. #[id number], [place]   Patient was given the opportunity to ask questions.  Patient verbalized understanding of the plan and was able to repeat key elements of the plan.   This documentation was completed using Radio producer.  Any transcriptional errors are unintentional.  No orders of the defined types were placed in this encounter.    Requested Prescriptions    No prescriptions requested or ordered in this encounter    No follow-ups on file.  Karle Plumber, MD, FACP

## 2022-03-06 LAB — CBC
Hematocrit: 41.2 % (ref 37.5–51.0)
Hemoglobin: 13.4 g/dL (ref 13.0–17.7)
MCH: 26.9 pg (ref 26.6–33.0)
MCHC: 32.5 g/dL (ref 31.5–35.7)
MCV: 83 fL (ref 79–97)
Platelets: 248 10*3/uL (ref 150–450)
RBC: 4.98 x10E6/uL (ref 4.14–5.80)
RDW: 12.4 % (ref 11.6–15.4)
WBC: 5.4 10*3/uL (ref 3.4–10.8)

## 2022-03-06 LAB — COMPREHENSIVE METABOLIC PANEL
ALT: 63 IU/L — ABNORMAL HIGH (ref 0–44)
AST: 36 IU/L (ref 0–40)
Albumin/Globulin Ratio: 1.4 (ref 1.2–2.2)
Albumin: 4.4 g/dL (ref 3.8–4.8)
Alkaline Phosphatase: 79 IU/L (ref 44–121)
BUN/Creatinine Ratio: 11 (ref 10–24)
BUN: 13 mg/dL (ref 8–27)
Bilirubin Total: 0.3 mg/dL (ref 0.0–1.2)
CO2: 28 mmol/L (ref 20–29)
Calcium: 9.9 mg/dL (ref 8.6–10.2)
Chloride: 100 mmol/L (ref 96–106)
Creatinine, Ser: 1.21 mg/dL (ref 0.76–1.27)
Globulin, Total: 3.2 g/dL (ref 1.5–4.5)
Glucose: 80 mg/dL (ref 70–99)
Potassium: 4.3 mmol/L (ref 3.5–5.2)
Sodium: 138 mmol/L (ref 134–144)
Total Protein: 7.6 g/dL (ref 6.0–8.5)
eGFR: 66 mL/min/{1.73_m2} (ref >59)

## 2022-03-06 LAB — PSA: Prostate Specific Ag, Serum: <0.1 ng/mL (ref 0.0–4.0)

## 2022-03-06 LAB — LIPID PANEL
Chol/HDL Ratio: 3.6 ratio (ref 0.0–5.0)
Cholesterol, Total: 171 mg/dL (ref 100–199)
HDL: 48 mg/dL (ref >39)
LDL Chol Calc (NIH): 109 mg/dL — ABNORMAL HIGH (ref 0–99)
Triglycerides: 75 mg/dL (ref 0–149)
VLDL Cholesterol Cal: 14 mg/dL (ref 5–40)

## 2022-03-06 LAB — HEMOGLOBIN A1C
Est. average glucose Bld gHb Est-mCnc: 134 mg/dL
Hgb A1c MFr Bld: 6.3 % — ABNORMAL HIGH (ref 4.8–5.6)

## 2022-03-30 ENCOUNTER — Other Ambulatory Visit: Payer: Self-pay | Admitting: Internal Medicine

## 2022-03-30 MED ORDER — ATORVASTATIN CALCIUM 10 MG PO TABS
10.0000 mg | ORAL_TABLET | Freq: Every day | ORAL | 1 refills | Status: DC
  Filled 2022-03-30 – 2022-05-25 (×2): qty 90, 90d supply, fill #0
  Filled 2022-06-24 – 2022-08-10 (×2): qty 90, 90d supply, fill #1

## 2022-03-31 ENCOUNTER — Other Ambulatory Visit: Payer: Self-pay

## 2022-04-02 ENCOUNTER — Other Ambulatory Visit: Payer: Self-pay

## 2022-04-02 ENCOUNTER — Encounter: Payer: Self-pay | Admitting: Urology

## 2022-04-02 ENCOUNTER — Ambulatory Visit (INDEPENDENT_AMBULATORY_CARE_PROVIDER_SITE_OTHER): Payer: Self-pay | Admitting: Urology

## 2022-04-02 VITALS — BP 143/88 | HR 94

## 2022-04-02 DIAGNOSIS — C61 Malignant neoplasm of prostate: Secondary | ICD-10-CM

## 2022-04-02 DIAGNOSIS — N401 Enlarged prostate with lower urinary tract symptoms: Secondary | ICD-10-CM

## 2022-04-02 DIAGNOSIS — R3912 Poor urinary stream: Secondary | ICD-10-CM

## 2022-04-02 DIAGNOSIS — Z8546 Personal history of malignant neoplasm of prostate: Secondary | ICD-10-CM

## 2022-04-02 DIAGNOSIS — N138 Other obstructive and reflux uropathy: Secondary | ICD-10-CM

## 2022-04-02 LAB — MICROSCOPIC EXAMINATION
Bacteria, UA: NONE SEEN
Renal Epithel, UA: NONE SEEN /hpf
WBC, UA: NONE SEEN /hpf (ref 0–5)

## 2022-04-02 LAB — URINALYSIS, ROUTINE W REFLEX MICROSCOPIC
Bilirubin, UA: NEGATIVE
Glucose, UA: NEGATIVE
Ketones, UA: NEGATIVE
Leukocytes,UA: NEGATIVE
Nitrite, UA: NEGATIVE
Protein,UA: NEGATIVE
Specific Gravity, UA: 1.01 (ref 1.005–1.030)
Urobilinogen, Ur: 0.2 mg/dL (ref 0.2–1.0)
pH, UA: 6.5 (ref 5.0–7.5)

## 2022-04-02 MED ORDER — TADALAFIL 20 MG PO TABS
20.0000 mg | ORAL_TABLET | Freq: Every day | ORAL | 5 refills | Status: DC | PRN
  Filled 2022-04-02: qty 10, 30d supply, fill #0
  Filled 2022-08-10: qty 10, 30d supply, fill #1

## 2022-04-02 NOTE — Patient Instructions (Signed)
Cancer de la prostate Prostate Cancer  La prostate est une petite glande qui produit le liquide composant le sperme (liquide sminal). Elle est prsente [employer], sous la vessie, devant le rectum. Le cancer de la prostate est la croissance anormale de cellules dans la glande prostatique. Quelles sont les causes ? La cause exacte de cette affection est inconnue. Quels sont les facteurs qui augmentent le risque ? Votre risque de dvelopper cette affection est plus lev si : Vous tes g de 65 ans ou plus. Vous avez des antcdents familiaux de cancer de la prostate. Vous avez des antcdents familiaux de cancer du sein ou de Midwife. Vous avez certains gnes qui vous ont t transmis de vos parents (hrits), comme le BRCA1 ou le BRCA2. Vous souffrez du syndrome de Field seismologist. Les diagnostics de cancer de la prostate sont plus frquents [employer] hommes afro-amricains ou d'origine africaine. Les USAA qui expliquent cela ne sont pas encore claires ; une combinaison de facteurs gntiques et environnementaux semble tre en cause. Quels sont les signes ou symptmes ? Les symptmes de cette affection incluent : Des problmes de miction. Ces problmes pourraient notamment tre : Un flux d'urine faible ou interrompu. Des difficults  initier ou arrter la miction. Des difficults  vider compltement sa vessie. Le besoin d'uriner plus souvent, surtout la nuit. Du sang dans vos urines ou dans votre sperme. Une douleur ou une gne persistantes dans le bas du dos ou du ventre, ou dans les hanches. Des difficults  obtenir une rection. Un engourdissement ou une faiblesse au niveau des [place] ou des pieds. Comment se fait le diagnostic ? Cette affection peut tre diagnostique par : Un toucher rectal. Lors de cet examen, un prestataire de soins de sant introduit un doigt gant dans le rectum afin de sentir la glande prostatique. Un test sanguin, appel test de l'antigne prostatique  spcifique (APS). Une procdure au cours de laquelle un chantillon de tissu de la prostate est prlev, puis examin au microscope (biopsie prostatique). Un examen d'imagerie appel chographie transrectale. Une fois l'affection diagnostique, des examens seront effectus pour dterminer l'tendue du cancer. Il s'agit de l'tape de stadification du cancer. La stadification pourra tre tablie  l'aide d'examens d'imagerie, comme une scintigraphie osseuse, une tomodensitomtrie, une tomographie par mission de positons ou [place]. Stades du cancer de la prostate Les stades du cancer de la prostate sont les suivants : Stade 1 (I).  ce stade, le cancer est uniquement localis dans la prostate. Le cancer n'est pas visible sur les examens d'imagerie et est gnralement dtect par hasard, lors d'une intervention chirurgicale de la prostate, par exemple. Stade 2 (II).  ce stade, le cancer est plus avanc qu'au stade 1, mais il reste localis dans la prostate. Stade 3 (III).  ce stade, le cancer s'est propag au-del de la paroi externe de la prostate dans les tissus environnants. Le cancer est prsent dans les vsicules sminales, situes prs de la vessie et de la prostate. Stade 4 (IV).  ce stade, le cancer s'est propag  d'autres parties du corps comme les ganglions lymphatiques, les os, la vessie, le rectum, le foie ou les poumons. Classification du cancer de la prostate Le cancer de la prostate est galement classifi selon l'aspect que prsentent les cellules cancreuses au microscope. C'est ce que l'on appelle le score de Gleason. Le score total peut varier de 6  10, indiquant la probabilit que le cancer se propage (se mtastase)  d'autres parties [employer]. Plus le score  est lev, plus la probabilit que le cancer se propage est leve. Gleason de 6 ou infrieur : Cela indique que les cellules cancreuses ressemblent aux cellules normales de la prostate (qu'elles sont bien  diffrencies). Gleason de 7 : Cela indique que les cellules cancreuses ressemblent un peu aux cellules normales de la prostate (qu'elles sont modrment diffrencies). Gleason de 8, 9 ou 10 : Cela indique que les cellules cancreuses sont trs diffrentes des cellules normales de la prostate (qu'elles sont peu diffrencies). Comment cette affection est-elle traite ? Le traitement de cette affection dpend de Education officer, museum, y compris du stade du cancer, de l'ge, des prfrences personnelles et de la sant globale. Discutez Patent examiner de soins de sant des options de traitement qui sont recommandes dans votre cas. Les traitements classiques incluent : Une observation, en cas de cancer de la prostate au stade prcoce (surveillance active). Cela implique des examens, des analyses sanguines et, dans certains cas, d'autres biopsies. Chez certains hommes, c'est le seul traitement requis. Vic Blackbird. Les types d'intervention chirurgicale incluent : La chirurgie par voie ouverte (prostatectomie radicale). Lors de [place] intervention, une large incision est pratique pour Research officer, political party prostate. La prostatectomie radicale laparoscopique. Il s'agit d'une intervention chirurgicale destine  retirer la prostate et les ganglions lymphatiques  travers plusieurs petites incisions. Ce type d'intervention est souvent dsign sous le terme de chirurgie mini-invasive. La prostatectomie radicale assiste par robot. Il s'agit d'une intervention chirurgicale laparoscopique destine  retirer la prostate et les ganglions lymphatiques  l'aide de bras robotiss contrls par [employer]. Une cryoablation. Il s'agit d'une intervention chirurgicale destine  congeler et  dtruire les cellules cancreuses. Un traitement par radiation. Les types de traitement par radiation incluent : Une radiothrapie externe. Ce type de Pension scheme manager des [employer] de [employer] corps vers la prostate  afin de dtruire les cellules cancreuses. Une brachythrapie. Ce type de [employer], des grains, des fils ou des tubes radioactifs dans la prostate. Tout comme la radiothrapie externe, la brachythrapie dtruit les cellules cancreuses. L'avantage est que ce type de radiation limite les dommages infligs aux tissus environnants et s'accompagne de moins d'effets secondaires. Une chimiothrapie. Ce traitement tue les cellules cancreuses ou arrte leur multiplication. Il tue aussi bien les cellules cancreuses que les cellules normales. Une thrapie cible. Ce traitement utilise des [employer] tuent les cellules cancreuses sans endommager les cellules normales. Une hormonothrapie. Ce traitement consiste  prendre des [employer] agissent sur la [place], qui est une des hormones masculines, en : Empchant l'organisme de produire de la testostrone. Empchant la testostrone d'atteindre les cellules cancreuses. [place] les instructions suivantes  domicile : Mode de vie N'utilisez pas de produits contenant de la nicotine ou du tabac. Il s'agit notamment des cigarettes, du tabac  mcher et des vapoteuses, comme les cigarettes lectroniques. Si vous avez besoin d'aide pour arrter de fumer, demandez conseil  votre prestataire de soins de sant. Adoptez une alimentation saine. Pour cela : Mangez des aliments riches en fibres. Il s'agit notamment des [employer], des crales compltes, et des fruits et des lgumes frais. Limitez votre consommation d'aliments riches en graisses et en sucres. Il s'agit notamment des aliments frits et des aliments sucrs. Le traitement du cancer de la prostate peut altrer votre fonction sexuelle. Si vous avez un(e) partenaire, continuez d'avoir des moments d'intimit avec elle/lui. Cela peut inclure des caresses, des clins, se toucher et se tenir [employer] bras l'un de l'autre. Dormez suffisamment. Pensez  joindre un  groupe de soutien pour les hommes atteints d'un cancer de la prostate. Vous runir avec un groupe de soutien pourrait vous aider  apprendre  grer le stress li au fait d'avoir un cancer. Instructions gnrales Prenez vos mdicaments en vente libre et sur ordonnance en suivant scrupuleusement les instructions de votre prestataire de soins de sant. Si vous devez tre hospitalis, informez votre spcialiste du cancer (oncologue). Rendez-vous  toutes les visites de suivi. C'est important. Pour plus d'informations American Cancer Society (Socit amricaine de Therapist, nutritional) : www.cancer.org American Society of Clinical Oncology (Socit amricaine d'oncologie clinique) : www.cancer.net [employer] cancer) : Engineer, agricultural.gov Prenez Special educational needs teacher de soins de sant si : Vous avez des difficults  uriner ou si cela empire. Vous remarquez la prsence de sang dans vos urines ou si vous en avez davantage. Vous avez mal aux hanches, au dos ou  la poitrine, ou si le mal empire. Demandez immdiatement de l'aide si : Vos jambes sont faibles ou engourdies. Vous ne parvenez pas  contrler vos urines et vos selles (incontinence). Vous avez des frissons ou de la fivre. Rsum La prostate est une petite glande implique dans la production du sperme. Elle se situe sous la vessie de l'homme, en avant du rectum. Le cancer de la prostate est la croissance anormale de cellules dans la glande prostatique. Le traitement de cette affection dpend du stade du cancer, de l'ge, des prfrences personnelles et de la sant globale. Discutez Patent examiner de soins de sant des options de traitement qui sont recommandes dans votre cas. Pensez  joindre un groupe de soutien pour les hommes atteints d'un cancer de la prostate. Vous runir avec un groupe de soutien pourrait vous aider  apprendre  grer le stress li au fait d'avoir un cancer. Ces conseils et  renseignements ne sauraient se substituer  l'avis mdical de votre prestataire de soins de sant. Par consquent, il est primordial de parler de toutes vos proccupations avec votre prestataire de soins de sant. Document Revised: 12/25/2020 Document Reviewed: 12/25/2020 Elsevier Patient Education  [place].

## 2022-04-02 NOTE — Addendum Note (Signed)
Addended by: Cleon Gustin on: 04/02/2022 01:05 PM   Modules accepted: Orders

## 2022-04-02 NOTE — Progress Notes (Signed)
04/02/2022 12:51 PM   Roy Reed 1956-01-10 [id number]  Referring provider: Ladell Pier, MD [place],  [place] [address]  Followup prostate cancer and BPH   HPI: Roy Reed is a 66yo here for followup for Prostate cancer and BPH. PSA remains undetectable. He has very rare hot flashes. IPSS 6 QOL 2 on rapaflo '8mg'$  and finasteride '5mg'$  daily. Nocturia 2x which bothers him. He drinks 500cc within 2 hours of going to bed.  He drinks coffee within 2 hours of bedtime.    PMH: Past Medical History:  Diagnosis Date   Elevated PSA    HLD (hyperlipidemia)    side effect from Tamsulosin   Left knee pain    Low back pain    Pre-diabetes    Prostate cancer ([place])    Rotator cuff disorder, right    Rotator cuff tear arthropathy of right shoulder 08/31/2017    Surgical History: Past Surgical History:  Procedure Laterality Date   COLONOSCOPY     PROSTATE BIOPSY     PROSTATE BIOPSY     RADIOACTIVE SEED IMPLANT N/A 01/02/2018   Procedure: RADIOACTIVE SEED IMPLANT/BRACHYTHERAPY IMPLANT;  Surgeon: Cleon Gustin, MD;  Location: [facility];  Service: Urology;  Laterality: N/A;   SHOULDER ARTHROSCOPY WITH ROTATOR CUFF REPAIR AND SUBACROMIAL DECOMPRESSION Right 08/05/2017   Procedure: RIGHT SHOULDER ARTHROSCOPY WITH ROTATOR CUFF REPAIR, DISTAL CLAVICLE EXCISION, DEBRIDEMENT AND SUBACROMIAL DECOMPRESSION;  Surgeon: Leandrew Koyanagi, MD;  Location: [place];  Service: Orthopedics;  Laterality: Right;   SPACE OAR INSTILLATION N/A 01/02/2018   Procedure: SPACE OAR INSTILLATION;  Surgeon: Cleon Gustin, MD;  Location: [facility];  Service: Urology;  Laterality: N/A;    Home Medications:  Allergies as of 04/02/2022       Reactions   Aspirin    States he can take if he takes acid reducer medicine first        Medication List        Accurate as of April 02, 2022 12:51 PM. If you have any questions, ask  your nurse or doctor.          atorvastatin 10 MG tablet Commonly known as: LIPITOR Take 1 tablet (10 mg total) by mouth daily.   diclofenac Sodium 1 % Gel Commonly known as: Voltaren Apply 2 g topically 4 (four) times daily.   finasteride 5 MG tablet Commonly known as: PROSCAR TAKE 1 TABLET (5 MG TOTAL) BY MOUTH DAILY.   metFORMIN 500 MG tablet Commonly known as: GLUCOPHAGE Take 0.5 tablets (250 mg total) by mouth daily with breakfast.   silodosin 8 MG Caps capsule Commonly known as: RAPAFLO Take 1 capsule (8 mg total) by mouth daily with breakfast.   triamcinolone cream 0.1 % Commonly known as: KENALOG Apply 1 application topically 2 (two) times daily.        Allergies:  Allergies  Allergen Reactions   Aspirin     States he can take if he takes acid reducer medicine first    Family History: No family history on file.  Social History:  reports that he has never smoked. He has never used smokeless tobacco. He reports that he does not currently use alcohol. He reports that he does not use drugs.  ROS: All other review of systems were reviewed and are negative except what is noted above in HPI  Physical Exam: BP (!) 143/88   Pulse 94   Constitutional:  Alert  and oriented, No acute distress. HEENT: [place] AT, moist mucus membranes.  Trachea midline, no masses. Cardiovascular: No clubbing, cyanosis, or edema. Respiratory: Normal respiratory effort, no increased work of breathing. GI: Abdomen is soft, nontender, nondistended, no abdominal masses GU: No CVA tenderness.  Lymph: No cervical or inguinal lymphadenopathy. Skin: No rashes, bruises or suspicious lesions. Neurologic: Grossly intact, no focal deficits, moving all 4 extremities. Psychiatric: Normal mood and affect.  Laboratory Data: Lab Results  Component Value Date   WBC 5.4 03/05/2022   HGB 13.4 03/05/2022   HCT 41.2 03/05/2022   MCV 83 03/05/2022   PLT 248 03/05/2022    Lab Results  Component  Value Date   CREATININE 1.21 03/05/2022    No results found for: "PSA"  No results found for: "TESTOSTERONE"  Lab Results  Component Value Date   HGBA1C 6.3 (H) 03/05/2022    Urinalysis    Component Value Date/Time   COLORURINE STRAW (A) 02/24/2018 1611   APPEARANCEUR Clear 03/31/2021 1510   LABSPEC 1.004 (L) 02/24/2018 1611   PHURINE 7.0 02/24/2018 1611   GLUCOSEU Negative 03/31/2021 1510   HGBUR SMALL (A) 02/24/2018 1611   BILIRUBINUR Negative 03/31/2021 Center 02/24/2018 1611   PROTEINUR Negative 03/31/2021 1510   PROTEINUR NEGATIVE 02/24/2018 1611   UROBILINOGEN negative (A) 01/21/2020 1430   NITRITE Negative 03/31/2021 1510   NITRITE NEGATIVE 02/24/2018 1611   LEUKOCYTESUR Negative 03/31/2021 1510    Lab Results  Component Value Date   LABMICR See below: 03/31/2021   WBCUA None seen 03/31/2021   LABEPIT None seen 03/31/2021   BACTERIA None seen 03/31/2021    Pertinent Imaging:  No results found for this or any previous visit.  No results found for this or any previous visit.  No results found for this or any previous visit.  No results found for this or any previous visit.  No results found for this or any previous visit.  No results found for this or any previous visit.  No results found for this or any previous visit.  No results found for this or any previous visit.   Assessment & Plan:    1. Malignant neoplasm of prostate ([place]) -RTC 1 year with PSA - Urinalysis, Routine w reflex microscopic  2. Benign prostatic hyperplasia with urinary obstruction -Continue rapaflo '8mg'$  and finasteride '5mg'$  daily  3. Weak urinary stream -continue rapaflo '8mg'$  and fiansteride '5mg'$  daily  4. Nocturia -Stop fluid consumption within 2 hours of going to bed. Patient to stop caffeine intake after 3pm.    No follow-ups on file.  Nicolette Bang, MD  [facility] [place]

## 2022-04-09 ENCOUNTER — Encounter: Payer: Self-pay | Admitting: Internal Medicine

## 2022-04-10 ENCOUNTER — Other Ambulatory Visit: Payer: Self-pay | Admitting: Internal Medicine

## 2022-04-10 DIAGNOSIS — M79672 Pain in left foot: Secondary | ICD-10-CM

## 2022-04-22 ENCOUNTER — Ambulatory Visit (INDEPENDENT_AMBULATORY_CARE_PROVIDER_SITE_OTHER): Payer: No Typology Code available for payment source

## 2022-04-22 ENCOUNTER — Other Ambulatory Visit: Payer: Self-pay

## 2022-04-22 ENCOUNTER — Encounter: Payer: Self-pay | Admitting: Podiatry

## 2022-04-22 ENCOUNTER — Ambulatory Visit (INDEPENDENT_AMBULATORY_CARE_PROVIDER_SITE_OTHER): Payer: No Typology Code available for payment source | Admitting: Podiatry

## 2022-04-22 DIAGNOSIS — M722 Plantar fascial fibromatosis: Secondary | ICD-10-CM

## 2022-04-22 MED ORDER — TRIAMCINOLONE ACETONIDE 40 MG/ML IJ SUSP
20.0000 mg | Freq: Once | INTRAMUSCULAR | Status: AC
  Administered 2022-04-22: 20 mg

## 2022-04-22 MED ORDER — METHYLPREDNISOLONE 4 MG PO TBPK
ORAL_TABLET | ORAL | 0 refills | Status: DC
  Filled 2022-04-22: qty 21, 6d supply, fill #0

## 2022-04-22 NOTE — Patient Instructions (Signed)

## 2022-04-22 NOTE — Progress Notes (Signed)
Subjective:  Patient ID: Roy Reed, male    DOB: 03/19/1956,  MRN: [id number] HPI Chief Complaint  Patient presents with   Foot Pain    Plantar foot left - aching, started 6 months ago, active in tennis 3 x weekly, worsened in the last month, stopped playing for 2 weeks-helped, but keeps coming back, tried diclofenac gel, PCP xrayed-negative fractures-referred here for treatment   New Patient (Initial Visit)    66 y.o. male presents with the above complaint.   ROS: Denies fever chills nausea vomit muscle aches pains calf pain back pain chest pain shortness of breath.  Past Medical History:  Diagnosis Date   Elevated PSA    HLD (hyperlipidemia)    side effect from Tamsulosin   Left knee pain    Low back pain    Pre-diabetes    Prostate cancer ([place])    Rotator cuff disorder, right    Rotator cuff tear arthropathy of right shoulder 08/31/2017   Past Surgical History:  Procedure Laterality Date   COLONOSCOPY     PROSTATE BIOPSY     PROSTATE BIOPSY     RADIOACTIVE SEED IMPLANT N/A 01/02/2018   Procedure: RADIOACTIVE SEED IMPLANT/BRACHYTHERAPY IMPLANT;  Surgeon: Cleon Gustin, MD;  Location: [facility];  Service: Urology;  Laterality: N/A;   SHOULDER ARTHROSCOPY WITH ROTATOR CUFF REPAIR AND SUBACROMIAL DECOMPRESSION Right 08/05/2017   Procedure: RIGHT SHOULDER ARTHROSCOPY WITH ROTATOR CUFF REPAIR, DISTAL CLAVICLE EXCISION, DEBRIDEMENT AND SUBACROMIAL DECOMPRESSION;  Surgeon: Leandrew Koyanagi, MD;  Location: [place];  Service: Orthopedics;  Laterality: Right;   SPACE OAR INSTILLATION N/A 01/02/2018   Procedure: SPACE OAR INSTILLATION;  Surgeon: Cleon Gustin, MD;  Location: [facility];  Service: Urology;  Laterality: N/A;    Current Outpatient Medications:    methylPREDNISolone (MEDROL DOSEPAK) 4 MG TBPK tablet, 6 day dose pack - take as directed, Disp: 21 tablet, Rfl: 0   atorvastatin (LIPITOR) 10 MG tablet, Take 1  tablet (10 mg total) by mouth daily., Disp: 90 tablet, Rfl: 1   diclofenac Sodium (VOLTAREN) 1 % GEL, Apply 2 g topically 4 (four) times daily., Disp: 100 g, Rfl: 0   finasteride (PROSCAR) 5 MG tablet, TAKE 1 TABLET (5 MG TOTAL) BY MOUTH DAILY., Disp: 30 tablet, Rfl: 2   metFORMIN (GLUCOPHAGE) 500 MG tablet, Take 0.5 tablets (250 mg total) by mouth daily with breakfast., Disp: 45 tablet, Rfl: 3   silodosin (RAPAFLO) 8 MG CAPS capsule, Take 1 capsule (8 mg total) by mouth daily with breakfast., Disp: 30 capsule, Rfl: 3   tadalafil (CIALIS) 20 MG tablet, Take 1 tablet (20 mg total) by mouth daily as needed., Disp: 10 tablet, Rfl: 5   triamcinolone cream (KENALOG) 0.1 %, Apply 1 application topically 2 (two) times daily. (Patient not taking: Reported on 09/01/2021), Disp: 30 g, Rfl: 1  Allergies  Allergen Reactions   Aspirin     States he can take if he takes acid reducer medicine first   Review of Systems Objective:  There were no vitals filed for this visit.  General: Well developed, nourished, in no acute distress, alert and oriented x3   Dermatological: Skin is warm, dry and supple bilateral. Nails x 10 are well maintained; remaining integument appears unremarkable at this time. There are no open sores, no preulcerative lesions, no rash or signs of infection present.  Vascular: Dorsalis Pedis artery and Posterior Tibial artery pedal pulses are 2/4 bilateral with immedate capillary fill time.  Pedal hair growth present. No varicosities and no lower extremity edema present bilateral.   Neruologic: Grossly intact via light touch bilateral. Vibratory intact via tuning fork bilateral. Protective threshold with Semmes Wienstein monofilament intact to all pedal sites bilateral. Patellar and Achilles deep tendon reflexes 2+ bilateral. No Babinski or clonus noted bilateral.   Musculoskeletal: No gross boney pedal deformities bilateral. No pain, crepitus, or limitation noted with foot and ankle range  of motion bilateral. Muscular strength 5/5 in all groups tested bilateral.  He has pain on palpation MucoClear tubercle left heel.  Gait: Unassisted, Nonantalgic.    Radiographs:  Assessment & Plan:   Assessment: Planter fasciitis left  Plan: Started him on methylprednisolone injected his left heel placed in a plantar fascia brace     Asalee Barrette T. [place], [place]

## 2022-04-29 ENCOUNTER — Other Ambulatory Visit: Payer: Self-pay | Admitting: Physician Assistant

## 2022-04-29 ENCOUNTER — Other Ambulatory Visit: Payer: Self-pay

## 2022-04-29 DIAGNOSIS — N138 Other obstructive and reflux uropathy: Secondary | ICD-10-CM

## 2022-04-29 MED ORDER — SILODOSIN 8 MG PO CAPS
8.0000 mg | ORAL_CAPSULE | Freq: Every day | ORAL | 3 refills | Status: DC
  Filled 2022-04-29: qty 30, 30d supply, fill #0
  Filled 2022-05-25: qty 30, 30d supply, fill #1
  Filled 2022-06-24: qty 30, 30d supply, fill #2
  Filled 2022-08-05: qty 30, 30d supply, fill #3

## 2022-04-30 ENCOUNTER — Other Ambulatory Visit: Payer: Self-pay

## 2022-05-11 ENCOUNTER — Other Ambulatory Visit: Payer: Self-pay | Admitting: Urology

## 2022-05-11 DIAGNOSIS — N138 Other obstructive and reflux uropathy: Secondary | ICD-10-CM

## 2022-05-12 ENCOUNTER — Other Ambulatory Visit: Payer: Self-pay

## 2022-05-12 MED ORDER — FINASTERIDE 5 MG PO TABS
ORAL_TABLET | Freq: Every day | ORAL | 2 refills | Status: DC
  Filled 2022-05-12: qty 30, 30d supply, fill #0
  Filled 2022-05-25: qty 60, 60d supply, fill #1

## 2022-05-24 ENCOUNTER — Encounter: Payer: Self-pay | Admitting: Podiatry

## 2022-05-24 ENCOUNTER — Ambulatory Visit (INDEPENDENT_AMBULATORY_CARE_PROVIDER_SITE_OTHER): Payer: No Typology Code available for payment source | Admitting: Podiatry

## 2022-05-24 DIAGNOSIS — M722 Plantar fascial fibromatosis: Secondary | ICD-10-CM

## 2022-05-24 MED ORDER — TRIAMCINOLONE ACETONIDE 40 MG/ML IJ SUSP
20.0000 mg | Freq: Once | INTRAMUSCULAR | Status: AC
  Administered 2022-05-24: 20 mg

## 2022-05-24 NOTE — Progress Notes (Signed)
He presents today for follow-up Planter fasciitis to his left foot.  States that is a considerably better approximately 80%.  Objective: Vital signs are stable he is alert and oriented x3 speaking to a [place] interpreter.  Pulses are palpable left.  He has mild reproducible pain on palpation of his left heel.  Assessment: Plan fasciitis left heel.  Plan: Reinjected today recommend he continue the use of the plantar fascial brace and allow 3 weeks before he tries to play tennis.  Follow-up with him in 6 weeks if necessary

## 2022-05-26 ENCOUNTER — Other Ambulatory Visit: Payer: Self-pay

## 2022-06-24 ENCOUNTER — Other Ambulatory Visit: Payer: Self-pay | Admitting: Urology

## 2022-06-24 ENCOUNTER — Other Ambulatory Visit: Payer: Self-pay

## 2022-06-24 DIAGNOSIS — N401 Enlarged prostate with lower urinary tract symptoms: Secondary | ICD-10-CM

## 2022-06-24 DIAGNOSIS — N138 Other obstructive and reflux uropathy: Secondary | ICD-10-CM

## 2022-06-25 ENCOUNTER — Other Ambulatory Visit: Payer: Self-pay

## 2022-06-25 ENCOUNTER — Encounter (HOSPITAL_COMMUNITY): Payer: Self-pay | Admitting: Emergency Medicine

## 2022-06-25 ENCOUNTER — Ambulatory Visit (HOSPITAL_COMMUNITY)
Admission: EM | Admit: 2022-06-25 | Discharge: 2022-06-25 | Disposition: A | Discharge status: Home / Self Care | Payer: Self-pay | Attending: Physician Assistant | Admitting: Physician Assistant

## 2022-06-25 DIAGNOSIS — M1990 Unspecified osteoarthritis, unspecified site: Secondary | ICD-10-CM

## 2022-06-25 DIAGNOSIS — M25532 Pain in left wrist: Secondary | ICD-10-CM

## 2022-06-25 HISTORY — DX: Type 2 diabetes mellitus without complications: E11.9

## 2022-06-25 LAB — URIC ACID: Uric Acid, Serum: 5.6 mg/dL (ref 3.7–8.6)

## 2022-06-25 MED ORDER — ACETAMINOPHEN ER 650 MG PO TBCR
650.0000 mg | EXTENDED_RELEASE_TABLET | Freq: Three times a day (TID) | ORAL | 0 refills | Status: DC | PRN
  Filled 2022-06-25: qty 21, 7d supply, fill #0

## 2022-06-25 MED ORDER — PREDNISONE 10 MG PO TABS
10.0000 mg | ORAL_TABLET | Freq: Three times a day (TID) | ORAL | 0 refills | Status: DC
  Filled 2022-06-25: qty 15, 5d supply, fill #0

## 2022-06-25 NOTE — Discharge Instructions (Addendum)
Advised that the uric acid level will be completed in 48 hours.  This will indicate whether the wrist pain is due to gouty arthritis.  Log onto MyChart to view the test results when it post in 48 hours. Advised to take arthritis Tylenol, 2 every 8 hours to help reduce pain and discomfort. Advised to take the prednisone 10 mg 3 times a day for 5 days only to help reduce the acute inflammatory process Advised to follow-up with PCP or return to urgent care if symptoms fail to improve.

## 2022-06-25 NOTE — ED Triage Notes (Signed)
C/o left wrist pain for a couple days. Denies any injury. Today has swelling in wrist area.

## 2022-06-25 NOTE — ED Provider Notes (Signed)
[place]    CSN: [id number] Arrival date & time: 06/25/22  1113      History   Chief Complaint Chief Complaint  Patient presents with   Wrist Pain    HPI Roy Reed is a 66 y.o. male.   Six 21-year-old male presents with left wrist pain.  Patient indicates for the past 3 days he has been having left wrist pain and discomfort and indicates that it is gotten worse over the past 24 hours.  He indicates he woke up this morning with the left wrist being swollen, tender to touch, and with decreased ability to move the wrist.  Patient indicates he has not had any trauma to the wrist, he has not been doing any repetitive motions over the past couple weeks that would aggravate the wrist.  Patient indicates he has been using a gel and taking Tylenol with out improvement.  Patient indicates that he does not have a history of having gout.  He indicates that he is allergic to shellfish so he avoids that type of seafood, but he does indicate he eats beans on a daily basis different types.  Patient denies any fever or chills, and there is no joint pain in any other areas.   Wrist Pain    Past Medical History:  Diagnosis Date   Diabetes mellitus without complication (HCC)    Elevated PSA    HLD (hyperlipidemia)    side effect from Tamsulosin   Left knee pain    Low back pain    Pre-diabetes    Prostate cancer ([place])    Rotator cuff disorder, right    Rotator cuff tear arthropathy of right shoulder 08/31/2017    Patient Active Problem List   Diagnosis Date Noted   Benign prostatic hyperplasia with urinary obstruction 01/21/2020   Normocytic anemia 11/30/2019   Elevated blood pressure reading 11/30/2019   Dyspnea on exertion 11/30/2019   Prediabetes 11/30/2019   Hyperlipidemia 08/15/2018   Obesity (BMI 30-39.9) 08/15/2018   Skin tag 08/15/2018   Malignant neoplasm of prostate ([place]) 10/13/2017   Screening for malignant neoplasm of colon 08/31/2017   Personal history  of gastric ulcer 02/09/2017    Past Surgical History:  Procedure Laterality Date   COLONOSCOPY     PROSTATE BIOPSY     PROSTATE BIOPSY     RADIOACTIVE SEED IMPLANT N/A 01/02/2018   Procedure: RADIOACTIVE SEED IMPLANT/BRACHYTHERAPY IMPLANT;  Surgeon: Cleon Gustin, MD;  Location: [facility];  Service: Urology;  Laterality: N/A;   SHOULDER ARTHROSCOPY WITH ROTATOR CUFF REPAIR AND SUBACROMIAL DECOMPRESSION Right 08/05/2017   Procedure: RIGHT SHOULDER ARTHROSCOPY WITH ROTATOR CUFF REPAIR, DISTAL CLAVICLE EXCISION, DEBRIDEMENT AND SUBACROMIAL DECOMPRESSION;  Surgeon: Leandrew Koyanagi, MD;  Location: [place];  Service: Orthopedics;  Laterality: Right;   SPACE OAR INSTILLATION N/A 01/02/2018   Procedure: SPACE OAR INSTILLATION;  Surgeon: Cleon Gustin, MD;  Location: [facility];  Service: Urology;  Laterality: N/A;       Home Medications    Prior to Admission medications   Medication Sig Start Date End Date Taking? Authorizing Provider  acetaminophen (TYLENOL 8 HOUR ARTHRITIS PAIN) 650 MG CR tablet Take 1 tablet (650 mg total) by mouth every 8 (eight) hours as needed for pain. 06/25/22  Yes Nyoka Lint, PA-C  predniSONE (DELTASONE) 10 MG tablet Take 1 tablet (10 mg total) by mouth in the morning, at noon, and at bedtime. 06/25/22  Yes Nyoka Lint, PA-C  atorvastatin (LIPITOR) 10 MG tablet Take 1 tablet (10 mg total) by mouth daily. 03/30/22   Ladell Pier, MD  diclofenac Sodium (VOLTAREN) 1 % GEL Apply 2 g topically 4 (four) times daily. 03/05/22   Ladell Pier, MD  finasteride (PROSCAR) 5 MG tablet TAKE 1 TABLET (5 MG TOTAL) BY MOUTH DAILY. 05/12/22 05/12/23  Cleon Gustin, MD  metFORMIN (GLUCOPHAGE) 500 MG tablet Take 0.5 tablets (250 mg total) by mouth daily with breakfast. 11/17/21   Ladell Pier, MD  silodosin (RAPAFLO) 8 MG CAPS capsule Take 1 capsule (8 mg total) by mouth daily with breakfast. 04/29/22    Summerlin, Berneice Heinrich, PA-C  tadalafil (CIALIS) 20 MG tablet Take 1 tablet (20 mg total) by mouth daily as needed. 04/02/22   McKenzie, Candee Furbish, MD    Family History No family history on file.  Social History Social History   Tobacco Use   Smoking status: Never   Smokeless tobacco: Never  Vaping Use   Vaping Use: Never used  Substance Use Topics   Alcohol use: Not Currently   Drug use: No     Allergies   Aspirin   Review of Systems Review of Systems  Musculoskeletal:  Positive for joint swelling (left wrist).     Physical Exam Triage Vital Signs ED Triage Vitals  Enc Vitals Group     BP 06/25/22 1215 (!) 146/85     Pulse Rate 06/25/22 1215 87     Resp 06/25/22 1215 17     Temp 06/25/22 1215 98.1 F (36.7 C)     Temp Source 06/25/22 1215 Oral     SpO2 06/25/22 1215 100 %     Weight --      Height --      Head Circumference --      Peak Flow --      Pain Score 06/25/22 1214 10     Pain Loc --      Pain Edu? --      Excl. in [place]? --    No data found.  Updated Vital Signs BP (!) 146/85 (BP Location: Right Arm)   Pulse 87   Temp 98.1 F (36.7 C) (Oral)   Resp 17   SpO2 100%   Visual Acuity Right Eye Distance:   Left Eye Distance:   Bilateral Distance:    Right Eye Near:   Left Eye Near:    Bilateral Near:     Physical Exam Constitutional:      Appearance: Normal appearance.  Musculoskeletal:       Arms:     Comments: Left wrist: 1+ swelling along the radial aspect of the wrist both anterior and posterior.  Range of motion is limited with pain on flexion and extension.  Pronation and pronation are normal.  Movement of the finger digits are all normal, capillary refill all digits normal.  Billet he is intact.  Neurological:     Mental Status: He is alert.      UC Treatments / Results  Labs (all labs ordered are listed, but only abnormal results are displayed) Labs Reviewed  URIC ACID    EKG   Radiology No results  found.  Procedures Procedures (including critical care time)  Medications Ordered in UC Medications - No data to display  Initial Impression / Assessment and Plan / UC Course  I have reviewed the triage vital signs and the nursing notes.  Pertinent labs & imaging results that were available during  my care of the patient were reviewed by me and considered in my medical decision making (see chart for details).    Plan: 1.  The left wrist pain will be treated with the following: A.  Tylenol arthritis 650 mg, 2 tablets every 8 hours help reduce pain. 2.  The inflammatory arthritis will be treated with the following: A.  Prednisone 10 mg 3 times a day for 5 days only to treat the acute inflammatory process. B.  A uric acid test is pending to determine if the inflammatory process is gout related. 3.  Patient has been given information on gouty and inflammatory arthritis in his native language of [place]. 4.  Patient advised follow-up PCP or return to urgent care if symptoms fail to improve. Final Clinical Impressions(s) / UC Diagnoses   Final diagnoses:  Left wrist pain  Inflammatory arthritis     Discharge Instructions      Advised that the uric acid level will be completed in 48 hours.  This will indicate whether the wrist pain is due to gouty arthritis.  Log onto MyChart to view the test results when it post in 48 hours. Advised to take arthritis Tylenol, 2 every 8 hours to help reduce pain and discomfort. Advised to take the prednisone 10 mg 3 times a day for 5 days only to help reduce the acute inflammatory process Advised to follow-up with PCP or return to urgent care if symptoms fail to improve.    ED Prescriptions     Medication Sig Dispense Auth. Provider   predniSONE (DELTASONE) 10 MG tablet Take 1 tablet (10 mg total) by mouth in the morning, at noon, and at bedtime. 15 tablet Nyoka Lint, PA-C   acetaminophen (TYLENOL 8 HOUR ARTHRITIS PAIN) 650 MG CR tablet Take 1  tablet (650 mg total) by mouth every 8 (eight) hours as needed for pain. 21 tablet Nyoka Lint, PA-C      PDMP not reviewed this encounter.   Nyoka Lint, PA-C 06/25/22 1258

## 2022-07-01 ENCOUNTER — Other Ambulatory Visit: Payer: Self-pay

## 2022-07-01 MED ORDER — FINASTERIDE 5 MG PO TABS
ORAL_TABLET | Freq: Every day | ORAL | 2 refills | Status: DC
  Filled 2022-07-01: qty 30, fill #0
  Filled 2022-08-05: qty 30, 30d supply, fill #0
  Filled 2022-08-10: qty 30, 30d supply, fill #1
  Filled 2022-10-12: qty 30, 30d supply, fill #2

## 2022-07-08 ENCOUNTER — Ambulatory Visit: Payer: No Typology Code available for payment source | Admitting: Podiatry

## 2022-08-06 ENCOUNTER — Other Ambulatory Visit: Payer: Self-pay

## 2022-08-10 ENCOUNTER — Other Ambulatory Visit: Payer: Self-pay | Admitting: Physician Assistant

## 2022-08-10 ENCOUNTER — Other Ambulatory Visit: Payer: Self-pay

## 2022-08-10 DIAGNOSIS — N138 Other obstructive and reflux uropathy: Secondary | ICD-10-CM

## 2022-08-11 ENCOUNTER — Other Ambulatory Visit: Payer: Self-pay

## 2022-08-31 ENCOUNTER — Other Ambulatory Visit: Payer: Self-pay

## 2022-08-31 ENCOUNTER — Other Ambulatory Visit (HOSPITAL_COMMUNITY): Payer: Self-pay

## 2022-08-31 ENCOUNTER — Other Ambulatory Visit: Payer: Self-pay | Admitting: Internal Medicine

## 2022-08-31 DIAGNOSIS — N401 Enlarged prostate with lower urinary tract symptoms: Secondary | ICD-10-CM

## 2022-09-01 ENCOUNTER — Other Ambulatory Visit: Payer: Self-pay

## 2022-09-02 ENCOUNTER — Other Ambulatory Visit: Payer: Self-pay | Admitting: Urology

## 2022-09-02 ENCOUNTER — Other Ambulatory Visit: Payer: Self-pay

## 2022-09-02 DIAGNOSIS — N138 Other obstructive and reflux uropathy: Secondary | ICD-10-CM

## 2022-09-03 ENCOUNTER — Other Ambulatory Visit: Payer: Self-pay

## 2022-09-03 MED ORDER — SILODOSIN 8 MG PO CAPS
8.0000 mg | ORAL_CAPSULE | Freq: Every day | ORAL | 3 refills | Status: DC
  Filled 2022-09-03: qty 60, 60d supply, fill #0
  Filled 2022-11-08: qty 60, 60d supply, fill #1

## 2022-09-07 ENCOUNTER — Ambulatory Visit: Payer: Self-pay | Admitting: Internal Medicine

## 2022-10-12 ENCOUNTER — Other Ambulatory Visit: Payer: Self-pay

## 2022-10-14 ENCOUNTER — Other Ambulatory Visit: Payer: Self-pay

## 2022-11-08 ENCOUNTER — Encounter: Payer: Self-pay | Admitting: Internal Medicine

## 2022-11-08 ENCOUNTER — Other Ambulatory Visit: Payer: Self-pay | Admitting: Urology

## 2022-11-08 ENCOUNTER — Ambulatory Visit: Payer: Self-pay | Attending: Internal Medicine | Admitting: Internal Medicine

## 2022-11-08 ENCOUNTER — Other Ambulatory Visit: Payer: Self-pay

## 2022-11-08 VITALS — BP 109/70 | HR 87 | Temp 98.1°F | Ht 74.0 in | Wt 265.0 lb

## 2022-11-08 DIAGNOSIS — N401 Enlarged prostate with lower urinary tract symptoms: Secondary | ICD-10-CM

## 2022-11-08 DIAGNOSIS — Z8546 Personal history of malignant neoplasm of prostate: Secondary | ICD-10-CM

## 2022-11-08 DIAGNOSIS — E782 Mixed hyperlipidemia: Secondary | ICD-10-CM

## 2022-11-08 DIAGNOSIS — R7303 Prediabetes: Secondary | ICD-10-CM

## 2022-11-08 DIAGNOSIS — N138 Other obstructive and reflux uropathy: Secondary | ICD-10-CM

## 2022-11-08 DIAGNOSIS — G4489 Other headache syndrome: Secondary | ICD-10-CM

## 2022-11-08 DIAGNOSIS — E669 Obesity, unspecified: Secondary | ICD-10-CM

## 2022-11-08 DIAGNOSIS — R0683 Snoring: Secondary | ICD-10-CM

## 2022-11-08 LAB — POCT GLYCOSYLATED HEMOGLOBIN (HGB A1C): HbA1c, POC (controlled diabetic range): 6.3 % (ref 0.0–7.0)

## 2022-11-08 LAB — GLUCOSE, POCT (MANUAL RESULT ENTRY): POC Glucose: 77 mg/dl (ref 70–99)

## 2022-11-08 NOTE — Progress Notes (Signed)
Patient ID: Roy Reed, male    DOB: Jun 18, 1956  MRN: [id number]  CC: Follow-up (Pre-diabetes, hyperlipideemia f/u /Urinating X2-3 per night/Discuss weight loss option)   Subjective: Roy Reed is a 67 y.o. male who presents for chronic ds management His concerns today include:  Pt with hx of pre-DM, obesity, HL, rotator cuff tear s/p RT shoulder arthroscopy and prostate CA (hormone and radiation seeds), obesity,  colon polyps, nonobst CAD.    Not sleeping well. Wakes up 2-3 x at nights to urinate.  He tries to stop drinking fluids around 8 p.m but sometimes he forgets and drinks late while on Zoom meetings in the evenings.  He drinks 2 liters of water daily.  Drinks coffee in a.m and p.m b/w 7-9 p.m Urine flows freely Last saw urology 03/2022.  PSA level was suppressed 02/2022.   PreDM/Obesity:   Results for orders placed or performed in visit on 11/08/22  POCT glucose (manual entry)  Result Value Ref Range   POC Glucose 77 70 - 99 mg/dl  POCT glycosylated hemoglobin (Hb A1C)  Result Value Ref Range   Hemoglobin A1C     HbA1c POC (<> result, manual entry)     HbA1c, POC (prediabetic range)     HbA1c, POC (controlled diabetic range) 6.3 0.0 - 7.0 %  A1C 6.3 Taking metformin 500 mg half a tablet daily.  Blood sugar little low today but he is asymptomatic. During winter, he does not exercise as much but started walking again 30 mins daily.  Plans to start playing tennis again next mth 3 days a wk. Does not eat beef; eats chicken and fish. Lots of veggies, nuts and fruits.  Reports HA after sleeping, intermittent times several months but more often recently.Marland Kitchen  HA LT occipital area.  Last several secs, occurs several times in mornings.  He feels tired during the day but tries to avoid taking naps.  Endorses loud snoring and states that his wife sometimes has to go into the other room because of this.   HL:  taking and tolerating Lipitor.  Last LDL 109. Patient Active  Problem List   Diagnosis Date Noted   Benign prostatic hyperplasia with urinary obstruction 01/21/2020   Normocytic anemia 11/30/2019   Elevated blood pressure reading 11/30/2019   Dyspnea on exertion 11/30/2019   Prediabetes 11/30/2019   Hyperlipidemia 08/15/2018   Obesity (BMI 30-39.9) 08/15/2018   Skin tag 08/15/2018   Malignant neoplasm of prostate ([place]) 10/13/2017   Screening for malignant neoplasm of colon 08/31/2017   Personal history of gastric ulcer 02/09/2017     Current Outpatient Medications on File Prior to Visit  Medication Sig Dispense Refill   atorvastatin (LIPITOR) 10 MG tablet Take 1 tablet (10 mg total) by mouth daily. 90 tablet 1   finasteride (PROSCAR) 5 MG tablet TAKE 1 TABLET (5 MG TOTAL) BY MOUTH DAILY. 30 tablet 2   metFORMIN (GLUCOPHAGE) 500 MG tablet Take 0.5 tablets (250 mg total) by mouth daily with breakfast. 45 tablet 3   silodosin (RAPAFLO) 8 MG CAPS capsule Take 1 capsule (8 mg total) by mouth daily with breakfast. 30 capsule 3   tadalafil (CIALIS) 20 MG tablet Take 1 tablet (20 mg total) by mouth daily as needed. (Patient not taking: Reported on 11/08/2022) 10 tablet 5   No current facility-administered medications on file prior to visit.    Allergies  Allergen Reactions   Aspirin     States he can take if he takes acid  reducer medicine first    Social History   Socioeconomic History   Marital status: Married    Spouse name: Not on file   Number of children: 6   Years of education: Not on file   Highest education level: Not on file  Occupational History   Occupation: retired  Tobacco Use   Smoking status: Never   Smokeless tobacco: Never  Vaping Use   Vaping Use: Never used  Substance and Sexual Activity   Alcohol use: Not Currently   Drug use: No   Sexual activity: Not on file  Other Topics Concern   Not on file  Social History Narrative   Not on file   Social Determinants of Health   Financial Resource Strain: Not on file   Food Insecurity: Not on file  Transportation Needs: Not on file  Physical Activity: Not on file  Stress: Not on file  Social Connections: Not on file  Intimate Partner Violence: Not on file    No family history on file.  Past Surgical History:  Procedure Laterality Date   COLONOSCOPY     PROSTATE BIOPSY     PROSTATE BIOPSY     RADIOACTIVE SEED IMPLANT N/A 01/02/2018   Procedure: RADIOACTIVE SEED IMPLANT/BRACHYTHERAPY IMPLANT;  Surgeon: Cleon Gustin, MD;  Location: [facility];  Service: Urology;  Laterality: N/A;   SHOULDER ARTHROSCOPY WITH ROTATOR CUFF REPAIR AND SUBACROMIAL DECOMPRESSION Right 08/05/2017   Procedure: RIGHT SHOULDER ARTHROSCOPY WITH ROTATOR CUFF REPAIR, DISTAL CLAVICLE EXCISION, DEBRIDEMENT AND SUBACROMIAL DECOMPRESSION;  Surgeon: Leandrew Koyanagi, MD;  Location: [place];  Service: Orthopedics;  Laterality: Right;   SPACE OAR INSTILLATION N/A 01/02/2018   Procedure: SPACE OAR INSTILLATION;  Surgeon: Cleon Gustin, MD;  [facility];  Service: Urology;  Laterality: N/A;    ROS: Review of Systems Negative except as stated above  PHYSICAL EXAM: BP 109/70 (BP Location: Left Arm, Patient Position: Sitting, Cuff Size: Large)   Pulse 87   Temp 98.1 F (36.7 C) (Oral)   Ht '6\' 2"'$  (1.88 m)   Wt 265 lb (120.2 kg)   SpO2 97%   BMI 34.02 kg/m   Wt Readings from Last 3 Encounters:  11/08/22 265 lb (120.2 kg)  03/05/22 264 lb 6.4 oz (119.9 kg)  09/01/21 261 lb (118.4 kg)    Physical Exam   General appearance - alert, well appearing, older African male and in no distress Mental status - normal mood, behavior, speech, dress, motor activity, and thought processes Neck - supple, no significant adenopathy Chest - clear to auscultation, no wheezes, rales or rhonchi, symmetric air entry Heart - normal rate, regular rhythm, normal S1, S2, no murmurs, rubs, clicks or gallops Extremities - peripheral pulses  normal, no pedal edema, no clubbing or cyanosis Neuro: Cranial nerves grossly intact.  Power 5/5 throughout in all extremities.  Gross sensation intact.  His gait is normal.    Latest Ref Rng & Units 03/05/2022    4:36 PM 04/22/2021    2:58 PM 12/07/2019   12:23 PM  CMP  Glucose 70 - 99 mg/dL 80  77  106   BUN 8 - 27 mg/dL '13  13  15   '$ Creatinine 0.76 - 1.27 mg/dL 1.21  1.11  1.07   Sodium 134 - 144 mmol/L 138  140  139   Potassium 3.5 - 5.2 mmol/L 4.3  4.7  4.8   Chloride 96 - 106 mmol/L 100  100  100   CO2 20 - 29 mmol/L '28  26  22   '$ Calcium 8.6 - 10.2 mg/dL 9.9  9.2  9.6   Total Protein 6.0 - 8.5 g/dL 7.6  7.6    Total Bilirubin 0.0 - 1.2 mg/dL 0.3  0.4    Alkaline Phos 44 - 121 IU/L 79  91    AST 0 - 40 IU/L 36  22    ALT 0 - 44 IU/L 63  36     Lipid Panel     Component Value Date/Time   CHOL 171 03/05/2022 1636   TRIG 75 03/05/2022 1636   HDL 48 03/05/2022 1636   CHOLHDL 3.6 03/05/2022 1636   LDLCALC 109 (H) 03/05/2022 1636    CBC    Component Value Date/Time   WBC 5.4 03/05/2022 1636   WBC 5.2 12/26/2017 0853   RBC 4.98 03/05/2022 1636   RBC 4.77 12/26/2017 0853   HGB 13.4 03/05/2022 1636   HCT 41.2 03/05/2022 1636   PLT 248 03/05/2022 1636   MCV 83 03/05/2022 1636   MCH 26.9 03/05/2022 1636   MCH 27.0 12/26/2017 0853   MCHC 32.5 03/05/2022 1636   MCHC 32.7 12/26/2017 0853   RDW 12.4 03/05/2022 1636   LYMPHSABS 2.6 02/28/2017 1651   EOSABS 0.0 02/28/2017 1651   BASOSABS 0.0 02/28/2017 1651    ASSESSMENT AND PLAN: 1. Obesity (BMI 30-39.9) Discussed on encourage healthy eating habits which is sounds as though he is doing his best to accomplish.  He declines referral to nutritionist. Commended him on regular exercise and encouraged him to continue doing so.  2. Prediabetes See #1 above.  He will continue low-dose metformin - POCT glucose (manual entry) - POCT glycosylated hemoglobin (Hb A1C)  3. Mixed hyperlipidemia Continue atorvastatin. - Lipid  panel  4. History of prostate cancer Followed by urology.  5. Headache syndrome Patient experiencing unilateral short live headaches.  However headaches associated with when he wakes up from sleep and he endorses loud snoring.  Both of which point towards possible underlying sleep apnea.  I recommend sleep study.  Patient is agreeable to this.  6. Loud snoring See #5 above - PSG Sleep Study; Future    Patient was given the opportunity to ask questions.  Patient verbalized understanding of the plan and was able to repeat key elements of the plan.   This documentation was completed using Radio producer.  Any transcriptional errors are unintentional.  Orders Placed This Encounter  Procedures   Lipid panel   POCT glucose (manual entry)   POCT glycosylated hemoglobin (Hb A1C)   PSG Sleep Study     Requested Prescriptions    No prescriptions requested or ordered in this encounter    Return in about 6 months (around 05/11/2023).  Karle Plumber, MD, FACP

## 2022-11-09 ENCOUNTER — Other Ambulatory Visit: Payer: Self-pay

## 2022-11-09 ENCOUNTER — Ambulatory Visit: Payer: Self-pay | Attending: Internal Medicine

## 2022-11-09 MED ORDER — FINASTERIDE 5 MG PO TABS
5.0000 mg | ORAL_TABLET | Freq: Every day | ORAL | 2 refills | Status: DC
  Filled 2022-11-09: qty 30, 30d supply, fill #0
  Filled 2022-12-09: qty 30, 30d supply, fill #1
  Filled 2023-01-04: qty 30, 30d supply, fill #2

## 2022-11-10 LAB — LIPID PANEL
Chol/HDL Ratio: 3.2 ratio (ref 0.0–5.0)
Cholesterol, Total: 164 mg/dL (ref 100–199)
HDL: 51 mg/dL (ref >39)
LDL Chol Calc (NIH): 100 mg/dL — ABNORMAL HIGH (ref 0–99)
Triglycerides: 65 mg/dL (ref 0–149)
VLDL Cholesterol Cal: 13 mg/dL (ref 5–40)

## 2022-11-14 ENCOUNTER — Other Ambulatory Visit: Payer: Self-pay | Admitting: Internal Medicine

## 2022-11-15 ENCOUNTER — Other Ambulatory Visit: Payer: Self-pay

## 2022-11-15 MED ORDER — METFORMIN HCL 500 MG PO TABS
250.0000 mg | ORAL_TABLET | Freq: Every day | ORAL | 1 refills | Status: DC
  Filled 2022-11-15: qty 45, 90d supply, fill #0
  Filled 2023-02-03 – 2023-02-15 (×3): qty 45, 90d supply, fill #1

## 2022-11-17 ENCOUNTER — Other Ambulatory Visit: Payer: Self-pay

## 2022-12-09 ENCOUNTER — Other Ambulatory Visit: Payer: Self-pay

## 2022-12-09 ENCOUNTER — Other Ambulatory Visit: Payer: Self-pay | Admitting: Internal Medicine

## 2022-12-09 MED ORDER — ATORVASTATIN CALCIUM 10 MG PO TABS
10.0000 mg | ORAL_TABLET | Freq: Every day | ORAL | 0 refills | Status: DC
  Filled 2022-12-09: qty 90, 90d supply, fill #0

## 2022-12-09 NOTE — Telephone Encounter (Signed)
Requested Prescriptions  Pending Prescriptions Disp Refills   atorvastatin (LIPITOR) 10 MG tablet 90 tablet 0    Sig: Take 1 tablet (10 mg total) by mouth daily.     Cardiovascular:  Antilipid - Statins Failed - 12/09/2022  9:56 AM      Failed - Lipid Panel in normal range within the last 12 months    Cholesterol, Total  Date Value Ref Range Status  11/09/2022 164 100 - 199 mg/dL Final   LDL Chol Calc (NIH)  Date Value Ref Range Status  11/09/2022 100 (H) 0 - 99 mg/dL Final   HDL  Date Value Ref Range Status  11/09/2022 51 >39 mg/dL Final   Triglycerides  Date Value Ref Range Status  11/09/2022 65 0 - 149 mg/dL Final         Passed - Patient is not pregnant      Passed - Valid encounter within last 12 months    Recent Outpatient Visits           1 month ago Obesity (BMI 30-39.9)   [place] Karle Plumber B, MD   9 months ago Pain of left heel   [place] Karle Plumber B, MD   1 year ago Obesity (BMI 30-39.9)   [place] Ladell Pier, MD   1 year ago Hyperlipidemia, unspecified hyperlipidemia type   [place], MD   1 year ago Benign prostatic hyperplasia with urinary obstruction   [place], MD       Future Appointments             In 3 months McKenzie, Candee Furbish, MD [facility] [place]   In 5 months Ladell Pier, MD [place]

## 2022-12-10 ENCOUNTER — Other Ambulatory Visit: Payer: Self-pay

## 2022-12-15 ENCOUNTER — Ambulatory Visit (HOSPITAL_BASED_OUTPATIENT_CLINIC_OR_DEPARTMENT_OTHER): Payer: Self-pay | Attending: Internal Medicine | Admitting: Internal Medicine

## 2022-12-15 DIAGNOSIS — R0683 Snoring: Secondary | ICD-10-CM

## 2022-12-15 DIAGNOSIS — G4733 Obstructive sleep apnea (adult) (pediatric): Secondary | ICD-10-CM | POA: Insufficient documentation

## 2022-12-19 ENCOUNTER — Telehealth: Payer: Self-pay | Admitting: Internal Medicine

## 2022-12-19 ENCOUNTER — Encounter: Payer: Self-pay | Admitting: Internal Medicine

## 2022-12-19 DIAGNOSIS — R0683 Snoring: Secondary | ICD-10-CM

## 2022-12-19 NOTE — Procedures (Signed)
    Patient Name: Roy Reed, Roy Reed Date: 12/15/2022 Gender: Male D.O.B: [birth year] Age (years): 55 Referring Provider: Jonah Blue MD Height (inches): 73 Interpreting Physician: Jetty Duhamel MD, ABSM Weight (lbs): 260 RPSGT: Shelah Lewandowsky BMI: 34 MRN: [id number] Neck Size: 18.75  CLINICAL INFORMATION Sleep Study Type: NPSG Indication for sleep study: Daytime Fatigue, Morning Headaches, Non-refreshing Sleep, Obesity, Snoring Epworth Sleepiness Score: 13  SLEEP STUDY TECHNIQUE As per the AASM Manual for the Scoring of Sleep and Associated Events v2.3 (April 2016) with a hypopnea requiring 4% desaturations.  The channels recorded and monitored were frontal, central and occipital EEG, electrooculogram (EOG), submentalis EMG (chin), nasal and oral airflow, thoracic and abdominal wall motion, anterior tibialis EMG, snore microphone, electrocardiogram, and pulse oximetry.  MEDICATIONS Medications self-administered by patient taken the night of the study : none reported  SLEEP ARCHITECTURE The study was initiated at 10:24:05 PM and ended at 5:05:49 AM.  Sleep onset time was 12.6 minutes and the sleep efficiency was 64.2%. The total sleep time was 258 minutes.  Stage REM latency was 73.0 minutes.  The patient spent 33.3% of the night in stage N1 sleep, 51.9% in stage N2 sleep, 0.0% in stage N3 and 14.7% in REM.  Alpha intrusion was absent.  Supine sleep was 11.43%.  RESPIRATORY PARAMETERS The overall apnea/hypopnea index (AHI) was 15.1 per hour. There were 3 total apneas, including 2 obstructive, 0 central and 1 mixed apneas. There were 62 hypopneas and 60 RERAs.  The AHI during Stage REM sleep was 67.9 per hour.  AHI while supine was 44.7 per hour.  The mean oxygen saturation was 92.0%. The minimum SpO2 during sleep was 77.0%.  moderate snoring was noted during this study.  CARDIAC DATA The 2 lead EKG demonstrated sinus rhythm. The mean heart rate was 68.2  beats per minute. Other EKG findings include: None.  LEG MOVEMENT DATA The total PLMS were 0 with a resulting PLMS index of 0.0. Associated arousal with leg movement index was 0.0 .  IMPRESSIONS - Moderate obstructive sleep apnea occurred during this study (AHI = 15.1/h). - Moderate oxygen desaturation was noted during this study (Min O2 = 77.0%, Mean 92%). - The patient snored with moderate snoring volume. - No cardiac abnormalities were noted during this study. - Clinically significant periodic limb movements did not occur during sleep. No significant associated arousals.  DIAGNOSIS - Obstructive Sleep Apnea (G47.33)  RECOMMENDATIONS - Suggest CPAP titration sleep study or autopap. Other options would be based on clinical judgment. - Be careful with alcohol, sedatives and other CNS depressants that may worsen sleep apnea and disrupt normal sleep architecture. - Sleep hygiene should be reviewed to assess factors that may improve sleep quality. - Weight management and regular exercise should be initiated or continued if appropriate.  [Electronically signed] 12/19/2022 10:32 AM  Jetty Duhamel MD, ABSM Diplomate, American Board of Sleep Medicine NPI: [id number]                         Jetty Duhamel Diplomate, American Board of Sleep Medicine  ELECTRONICALLY SIGNED ON:  12/19/2022, 10:30 AM [facility] PH: [phone]   FX: [phone] ACCREDITED BY THE AMERICAN ACADEMY OF SLEEP MEDICINE

## 2022-12-21 NOTE — Telephone Encounter (Signed)
Called & spoke to the patient with Language Line interpreter,  Trinna Post [phone]. Verified name & DOB. Informed of results and that our case worker would reach out to see if she can asist him in getting a CPAP device. The patient stated that he would like an appointment to further discuss sleep apnea. Appointment scheduled for 12/27/2022. Patient confirmed appointment.

## 2022-12-22 NOTE — Telephone Encounter (Signed)
I spoke to the patient with assistance of [place] interpreter [id number]/[employer].  He is uninsured and is not able to afford a new CPAP machine. I explained to him that we work with a program that provides machines at a very reduced rate and I can speak to him about that when he comes to his appointment with Dr Laural Benes on 12/27/2202.  He was very Adult nurse.   I asked him if he ever applied for Medicaid and he said not yet. I explained to him that there are Medicaid enrollment caseworkers in our building who may be able to assist him and I will also have him meet with them on 12/27/2022.  He was also very appreciative of that assistance. He currently has the [employer] and [employer] until July 2024

## 2022-12-27 ENCOUNTER — Ambulatory Visit: Payer: Self-pay | Attending: Internal Medicine | Admitting: Internal Medicine

## 2022-12-27 ENCOUNTER — Encounter: Payer: Self-pay | Admitting: Internal Medicine

## 2022-12-27 ENCOUNTER — Other Ambulatory Visit: Payer: Self-pay

## 2022-12-27 ENCOUNTER — Telehealth: Payer: Self-pay

## 2022-12-27 VITALS — BP 121/75 | HR 82 | Temp 98.4°F | Ht 73.0 in | Wt 267.0 lb

## 2022-12-27 DIAGNOSIS — G4733 Obstructive sleep apnea (adult) (pediatric): Secondary | ICD-10-CM | POA: Insufficient documentation

## 2022-12-27 DIAGNOSIS — Z23 Encounter for immunization: Secondary | ICD-10-CM

## 2022-12-27 MED ORDER — ZOSTER VAC RECOMB ADJUVANTED 50 MCG/0.5ML IM SUSR
0.5000 mL | Freq: Once | INTRAMUSCULAR | 0 refills | Status: AC
Start: 2022-12-27 — End: 2022-12-28
  Filled 2022-12-27: qty 0.5, 1d supply, fill #0

## 2022-12-27 NOTE — Progress Notes (Signed)
Patient ID: Roy Reed, male    DOB: 12-May-1956  MRN: [id number]  CC: Apnea (Pt would like to discuss sleep apnea & CPAP machine. Roy Reed like to discuss shingles vax. )   Subjective: Roy Reed is a 67 y.o. male who presents for UC visit to discuss dx of OSA His concerns today include:  Pt with hx of pre-DM, obesity, HL, rotator cuff tear s/p RT shoulder arthroscopy and prostate CA (hormone and radiation seeds), obesity,  colon polyps, nonobst CAD.    [employer] Language interpreter used during this encounter. #[id number], Roy Reed  11/08/2022 Visit:  Reports HA after sleeping, intermittent times several months but more often recently.Marland Kitchen  HA LT occipital area.  Last several secs, occurs several times in mornings.  He feels tired during the day but tries to avoid taking naps.  Endorses loud snoring and states that his wife sometimes has to go into the other room because of this.   Sleep study done to eval for OSA.  This revealed moderate OSA with moderate O2 desat 77-92%.  CPAP titration or autopap recommended.  Pt sent info via Mychart and I have put him in contact with our CW for assistance with getting a CPAP machine since pt is uninsured.  .   Patient Active Problem List   Diagnosis Date Noted   Benign prostatic hyperplasia with urinary obstruction 01/21/2020   Normocytic anemia 11/30/2019   Elevated blood pressure reading 11/30/2019   Dyspnea on exertion 11/30/2019   Prediabetes 11/30/2019   Hyperlipidemia 08/15/2018   Obesity (BMI 30-39.9) 08/15/2018   Skin tag 08/15/2018   Malignant neoplasm of prostate 10/13/2017   Screening for malignant neoplasm of colon 08/31/2017   Personal history of gastric ulcer 02/09/2017     Current Outpatient Medications on File Prior to Visit  Medication Sig Dispense Refill   atorvastatin (LIPITOR) 10 MG tablet Take 1 tablet (10 mg total) by mouth daily. 90 tablet 0   finasteride (PROSCAR) 5 MG tablet Take 1 tablet (5 mg total) by mouth daily. 30  tablet 2   metFORMIN (GLUCOPHAGE) 500 MG tablet Take 0.5 tablets (250 mg total) by mouth daily with breakfast. 45 tablet 1   silodosin (RAPAFLO) 8 MG CAPS capsule Take 1 capsule (8 mg total) by mouth daily with breakfast. 30 capsule 3   tadalafil (CIALIS) 20 MG tablet Take 1 tablet (20 mg total) by mouth daily as needed. (Patient not taking: Reported on 11/08/2022) 10 tablet 5   No current facility-administered medications on file prior to visit.    Allergies  Allergen Reactions   Aspirin     States he can take if he takes acid reducer medicine first    Social History   Socioeconomic History   Marital status: Married    Spouse name: Not on file   Number of children: 6   Years of education: Not on file   Highest education level: Not on file  Occupational History   Occupation: retired  Tobacco Use   Smoking status: Never   Smokeless tobacco: Never  Vaping Use   Vaping Use: Never used  Substance and Sexual Activity   Alcohol use: Not Currently   Drug use: No   Sexual activity: Not on file  Other Topics Concern   Not on file  Social History Narrative   Not on file   Social Determinants of Health   Financial Resource Strain: Not on file  Food Insecurity: Not on file  Transportation Needs: Not on file  Physical Activity: Not on file  Stress: Not on file  Social Connections: Not on file  Intimate Partner Violence: Not on file    No family history on file.  Past Surgical History:  Procedure Laterality Date   COLONOSCOPY     PROSTATE BIOPSY     PROSTATE BIOPSY     RADIOACTIVE SEED IMPLANT N/A 01/02/2018   Procedure: RADIOACTIVE SEED IMPLANT/BRACHYTHERAPY IMPLANT;  Surgeon: Malen Gauze, MD;  Location: [facility];  Service: Urology;  Laterality: N/A;   SHOULDER ARTHROSCOPY WITH ROTATOR CUFF REPAIR AND SUBACROMIAL DECOMPRESSION Right 08/05/2017   Procedure: RIGHT SHOULDER ARTHROSCOPY WITH ROTATOR CUFF REPAIR, DISTAL CLAVICLE EXCISION, DEBRIDEMENT  AND SUBACROMIAL DECOMPRESSION;  Surgeon: Tarry Kos, MD;  Location: [facility];  Service: Orthopedics;  Laterality: Right;   SPACE OAR INSTILLATION N/A 01/02/2018   Procedure: SPACE OAR INSTILLATION;  Surgeon: Malen Gauze, MD;  Location: [facility];  Service: Urology;  Laterality: N/A;    ROS: Review of Systems Negative except as stated above  PHYSICAL EXAM: BP 121/75 (BP Location: Left Arm, Patient Position: Sitting, Cuff Size: Large)   Pulse 82   Temp 98.4 F (36.9 C) (Oral)   Ht  (1.854 m)   Wt 267 lb (121.1 kg)   SpO2 99%   BMI 35.23 kg/m   Physical Exam General appearance - alert, well appearing, and in no distress Mental status - normal mood, behavior, speech, dress, motor activity, and thought processes     Latest Ref Rng & Units 03/05/2022    4:36 PM 04/22/2021    2:58 PM 12/07/2019   12:23 PM  CMP  Glucose 70 - 99 mg/dL 80  77  161   BUN 8 - 27 mg/dL Creatinine 0.76 - 1.27 mg/dL 0.96  0.45  4.09   Sodium 134 - 144 mmol/L 138  140  139   Potassium 3.5 - 5.2 mmol/L 4.3  4.7  4.8   Chloride 96 - 106 mmol/L 100  100  100   CO2 20 - 29 mmol/L Calcium 8.6 - 10.2 mg/dL 9.9  9.2  9.6   Total Protein 6.0 - 8.5 g/dL 7.6  7.6    Total Bilirubin 0.0 - 1.2 mg/dL 0.3  0.4    Alkaline Phos 44 - 121 IU/L 79  91    AST 0 - 40 IU/L 36  22    ALT 0 - 44 IU/L 63  36     Lipid Panel     Component Value Date/Time   CHOL 164 11/09/2022 0907   TRIG 65 11/09/2022 0907   HDL 51 11/09/2022 0907   CHOLHDL 3.2 11/09/2022 0907   LDLCALC 100 (H) 11/09/2022 0907    CBC    Component Value Date/Time   WBC 5.4 03/05/2022 1636   WBC 5.2 12/26/2017 0853   RBC 4.98 03/05/2022 1636   RBC 4.77 12/26/2017 0853   HGB 13.4 03/05/2022 1636   HCT 41.2 03/05/2022 1636   PLT 248 03/05/2022 1636   MCV 83 03/05/2022 1636   MCH 26.9 03/05/2022 1636   MCH 27.0 12/26/2017 0853   MCHC 32.5 03/05/2022 1636   MCHC 32.7  12/26/2017 0853   RDW 12.4 03/05/2022 1636   LYMPHSABS 2.6 02/28/2017 1651   EOSABS 0.0 02/28/2017 1651   BASOSABS 0.0 02/28/2017 1651    ASSESSMENT AND PLAN: 1. OSA (obstructive sleep apnea) Discussed  symptoms, dx and management.  Also discussed worsening of CV ds like HTN that can occur with untreated OSA.  All questions were answered.  CW meet with pt today to discuss getting CPAP.  Encouraged to sign up for Medicaid - For home use only DME continuous positive airway pressure (CPAP)  2. Need for shingles vaccine Due for Shingrix vaccine.  Pt agreeable to vaccine.  Rxn given to take to pharmacy today   Patient was given the opportunity to ask questions.  Patient verbalized understanding of the plan and was able to repeat key elements of the plan.   This documentation was completed using Paediatric nurse.  Any transcriptional errors are unintentional.  Orders Placed This Encounter  Procedures   For home use only DME continuous positive airway pressure (CPAP)     Requested Prescriptions   Signed Prescriptions Disp Refills   Zoster Vaccine Adjuvanted [facility]) injection 0.5 mL 0    Sig: Inject 0.5 mLs into the muscle once for 1 dose.    No follow-ups on file.  Jonah Blue, MD, FACP

## 2022-12-27 NOTE — Telephone Encounter (Signed)
I met with the patient when he was in the clinic today. [employer] [place] interpreter, [name] assisted. He is uninsured and unable to afford a new CPAP machine or the $200 donation to the American Sleep Apnea Association (ASAA) for a refurbished machine.  I explained  that [facility] has funding available to assist with the $200 donation for the American Sleep Apnea Association CPAP Assistance Program.   I explained that the refurbished CPAP machine from the ASAA will be delivered to [facility]. I then will schedule him to meet with a respiratory technician at the [facility] to review the use, cleaning and care of the machine.  I explained that there is no warranty or technical support that comes with the machine, it is provided " as is."   I also explained that I do not have a time frame for the delivery of the machine. It could possibly take 3+ months. He said he understood all that was discussed  and would like to submit the application to the ASAA. He signed the Patient Acknowledgement and release from liability form .    I explained to him that [facility] DSS has Medicaid eligibility caseworkers on the 4th floor of this building that can assist his with submitting a Medicaid application.  He was very interested and met with the caseworker at 1330 today.  They have access to a [place].    The application for the CPAP machine was submitted electronically to the ASAA. The prescription and patient acknowledgement was emailed to [email or web address]

## 2023-01-04 ENCOUNTER — Other Ambulatory Visit: Payer: Self-pay

## 2023-01-04 ENCOUNTER — Other Ambulatory Visit: Payer: Self-pay | Admitting: Urology

## 2023-01-04 DIAGNOSIS — N138 Other obstructive and reflux uropathy: Secondary | ICD-10-CM

## 2023-01-04 NOTE — Telephone Encounter (Signed)
Message received from ASAA that they have a CPAP machine for the patient and they are requesting payment.   $200 fee paid as requested

## 2023-01-06 ENCOUNTER — Other Ambulatory Visit: Payer: Self-pay

## 2023-01-06 MED ORDER — SILODOSIN 8 MG PO CAPS
8.0000 mg | ORAL_CAPSULE | Freq: Every day | ORAL | 3 refills | Status: DC
Start: 2023-01-06 — End: 2023-04-06
  Filled 2023-01-06: qty 30, 30d supply, fill #0
  Filled 2023-02-03: qty 30, 30d supply, fill #1
  Filled 2023-02-21 – 2023-02-23 (×2): qty 30, 30d supply, fill #2

## 2023-01-11 NOTE — Telephone Encounter (Signed)
With the assistance of [place] interpreter [phone]/[employer], I called patient and informed him that the CPAP machine has been delivered to our office.  I will be taking it to [facility] and they will be contacting him about scheduling an appointment for education about the use and care of the machine. He said he understood and was very Adult nurse.    Regarding Medicaid eligibility he said he is not eligible for Medicaid because he is "not able to fulfill one of the criteria. "  He has an appointment with [facility] Financial Counselor to apply for [employer] Assistance/ [employer] on 01/13/2023.

## 2023-01-13 ENCOUNTER — Ambulatory Visit: Payer: Self-pay | Attending: Internal Medicine

## 2023-01-17 ENCOUNTER — Other Ambulatory Visit: Payer: Self-pay

## 2023-01-21 ENCOUNTER — Other Ambulatory Visit: Payer: Self-pay

## 2023-01-25 ENCOUNTER — Other Ambulatory Visit: Payer: Self-pay

## 2023-02-01 NOTE — Telephone Encounter (Signed)
Patient is scheduled for CPAP teaching and 02/11/2023 at [facility]

## 2023-02-03 ENCOUNTER — Other Ambulatory Visit: Payer: Self-pay | Admitting: Internal Medicine

## 2023-02-03 ENCOUNTER — Other Ambulatory Visit: Payer: Self-pay

## 2023-02-03 ENCOUNTER — Other Ambulatory Visit: Payer: Self-pay | Admitting: Urology

## 2023-02-03 DIAGNOSIS — N401 Enlarged prostate with lower urinary tract symptoms: Secondary | ICD-10-CM

## 2023-02-03 MED ORDER — FINASTERIDE 5 MG PO TABS
5.0000 mg | ORAL_TABLET | Freq: Every day | ORAL | 2 refills | Status: DC
Start: 2023-02-03 — End: 2023-04-06
  Filled 2023-02-03: qty 30, 30d supply, fill #0
  Filled 2023-02-21 – 2023-02-23 (×2): qty 30, 30d supply, fill #1

## 2023-02-03 MED ORDER — ATORVASTATIN CALCIUM 10 MG PO TABS
10.0000 mg | ORAL_TABLET | Freq: Every day | ORAL | 1 refills | Status: DC
  Filled 2023-02-03 – 2023-02-21 (×2): qty 90, 90d supply, fill #0
  Filled 2023-02-23: qty 30, 30d supply, fill #0
  Filled 2023-04-11 (×2): qty 30, 30d supply, fill #1
  Filled 2023-05-06 (×2): qty 30, 30d supply, fill #2
  Filled 2023-06-06 (×2): qty 30, 30d supply, fill #3
  Filled 2023-07-05 (×2): qty 30, 30d supply, fill #4
  Filled 2023-08-03 (×2): qty 30, 30d supply, fill #5

## 2023-02-04 ENCOUNTER — Other Ambulatory Visit: Payer: Self-pay

## 2023-02-11 ENCOUNTER — Ambulatory Visit (HOSPITAL_BASED_OUTPATIENT_CLINIC_OR_DEPARTMENT_OTHER): Payer: MEDICAID | Attending: Internal Medicine

## 2023-02-15 ENCOUNTER — Other Ambulatory Visit: Payer: Self-pay

## 2023-02-21 ENCOUNTER — Other Ambulatory Visit: Payer: Self-pay

## 2023-02-23 ENCOUNTER — Other Ambulatory Visit: Payer: Self-pay

## 2023-03-11 ENCOUNTER — Encounter: Payer: Self-pay | Admitting: Gastroenterology

## 2023-03-30 ENCOUNTER — Other Ambulatory Visit: Payer: Self-pay

## 2023-03-30 DIAGNOSIS — C61 Malignant neoplasm of prostate: Secondary | ICD-10-CM

## 2023-04-06 ENCOUNTER — Other Ambulatory Visit: Payer: Self-pay

## 2023-04-06 ENCOUNTER — Ambulatory Visit (INDEPENDENT_AMBULATORY_CARE_PROVIDER_SITE_OTHER): Payer: Self-pay | Admitting: Urology

## 2023-04-06 VITALS — BP 135/87 | HR 93

## 2023-04-06 DIAGNOSIS — N401 Enlarged prostate with lower urinary tract symptoms: Secondary | ICD-10-CM

## 2023-04-06 DIAGNOSIS — N138 Other obstructive and reflux uropathy: Secondary | ICD-10-CM

## 2023-04-06 DIAGNOSIS — C61 Malignant neoplasm of prostate: Secondary | ICD-10-CM

## 2023-04-06 DIAGNOSIS — R3912 Poor urinary stream: Secondary | ICD-10-CM

## 2023-04-06 MED ORDER — FINASTERIDE 5 MG PO TABS
5.0000 mg | ORAL_TABLET | Freq: Every day | ORAL | 3 refills | Status: DC
Start: 2023-04-06 — End: 2024-04-26
  Filled 2023-04-06: qty 90, 90d supply, fill #0
  Filled 2023-05-06 – 2023-07-05 (×2): qty 90, 90d supply, fill #1
  Filled 2023-10-07: qty 90, 90d supply, fill #2
  Filled 2024-01-03: qty 90, 90d supply, fill #3

## 2023-04-06 MED ORDER — SILODOSIN 8 MG PO CAPS
8.0000 mg | ORAL_CAPSULE | Freq: Every day | ORAL | 11 refills | Status: DC
Start: 2023-04-06 — End: 2024-04-26
  Filled 2023-04-06: qty 30, 30d supply, fill #0
  Filled 2023-05-06: qty 30, 30d supply, fill #1
  Filled 2023-06-06 (×2): qty 30, 30d supply, fill #2
  Filled 2023-07-05: qty 30, 30d supply, fill #3
  Filled 2023-08-03: qty 30, 30d supply, fill #4
  Filled 2023-09-06: qty 30, 30d supply, fill #5
  Filled 2023-10-07: qty 30, 30d supply, fill #6
  Filled 2023-11-14: qty 30, 30d supply, fill #7
  Filled 2023-11-21 – 2023-11-25 (×3): qty 30, 30d supply, fill #8
  Filled 2024-01-23: qty 30, 30d supply, fill #9
  Filled 2024-02-18: qty 30, 30d supply, fill #10
  Filled 2024-03-21: qty 30, 30d supply, fill #11

## 2023-04-06 NOTE — Progress Notes (Signed)
04/06/2023 2:26 PM   Roy Reed March 19, 1956 [id number]  Referring provider: Marcine Matar, MD [address] [place],  [place] [address]  Followup prostate cancer and BPH   HPI: [name] is a 67yo here for followup for prostate cancer and BPH.PSA remains undetectable.  IPSS 4 QOL 0 on rapaflo 8mg  daily and finasteride. Nocturia 0-2x depending on fluid consumption. Uirne stream strong. No straining to urinate. No dysuria. No other complaints today  PMH: Past Medical History:  Diagnosis Date   Diabetes mellitus without complication (HCC)    Elevated PSA    HLD (hyperlipidemia)    side effect from Tamsulosin   Left knee pain    Low back pain    Pre-diabetes    Prostate cancer (HCC)    Rotator cuff disorder, right    Rotator cuff tear arthropathy of right shoulder 08/31/2017    Surgical History: Past Surgical History:  Procedure Laterality Date   COLONOSCOPY     PROSTATE BIOPSY     PROSTATE BIOPSY     RADIOACTIVE SEED IMPLANT N/A 01/02/2018   Procedure: RADIOACTIVE SEED IMPLANT/BRACHYTHERAPY IMPLANT;  Surgeon: Malen Gauze, MD;  Location: [facility];  Service: Urology;  Laterality: N/A;   SHOULDER ARTHROSCOPY WITH ROTATOR CUFF REPAIR AND SUBACROMIAL DECOMPRESSION Right 08/05/2017   Procedure: RIGHT SHOULDER ARTHROSCOPY WITH ROTATOR CUFF REPAIR, DISTAL CLAVICLE EXCISION, DEBRIDEMENT AND SUBACROMIAL DECOMPRESSION;  Surgeon: Tarry Kos, MD;  Location: [facility];  Service: Orthopedics;  Laterality: Right;   SPACE OAR INSTILLATION N/A 01/02/2018   Procedure: SPACE OAR INSTILLATION;  Surgeon: Malen Gauze, MD;  Location: [facility];  Service: Urology;  Laterality: N/A;    Home Medications:  Allergies as of 04/06/2023       Reactions   Aspirin    States he can take if he takes acid reducer medicine first        Medication List        Accurate as of April 06, 2023  2:26 PM. If you have any  questions, ask your nurse or doctor.          atorvastatin 10 MG tablet Commonly known as: LIPITOR Take 1 tablet (10 mg total) by mouth daily.   finasteride 5 MG tablet Commonly known as: PROSCAR Take 1 tablet (5 mg total) by mouth daily.   metFORMIN 500 MG tablet Commonly known as: GLUCOPHAGE Take 0.5 tablets (250 mg total) by mouth daily with breakfast.   silodosin 8 MG Caps capsule Commonly known as: RAPAFLO Take 1 capsule (8 mg total) by mouth daily with breakfast.   tadalafil 20 MG tablet Commonly known as: CIALIS Take 1 tablet (20 mg total) by mouth daily as needed.        Allergies:  Allergies  Allergen Reactions   Aspirin     States he can take if he takes acid reducer medicine first    Family History: No family history on file.  Social History:  reports that he has never smoked. He has never used smokeless tobacco. He reports that he does not currently use alcohol. He reports that he does not use drugs.  ROS: All other review of systems were reviewed and are negative except what is noted above in HPI  Physical Exam: BP 135/87   Pulse 93   Constitutional:  Alert and oriented, No acute distress. HEENT:  AT, moist mucus membranes.  Trachea midline, no masses. Cardiovascular: No clubbing, cyanosis, or edema.  Respiratory: Normal respiratory effort, no increased work of breathing. GI: Abdomen is soft, nontender, nondistended, no abdominal masses GU: No CVA tenderness.  Lymph: No cervical or inguinal lymphadenopathy. Skin: No rashes, bruises or suspicious lesions. Neurologic: Grossly intact, no focal deficits, moving all 4 extremities. Psychiatric: Normal mood and affect.  Laboratory Data: Lab Results  Component Value Date   WBC 5.4 03/05/2022   HGB 13.4 03/05/2022   HCT 41.2 03/05/2022   MCV 83 03/05/2022   PLT 248 03/05/2022    Lab Results  Component Value Date   CREATININE 1.21 03/05/2022    No results found for: "PSA"  No results  found for: "TESTOSTERONE"  Lab Results  Component Value Date   HGBA1C 6.3 11/08/2022    Urinalysis    Component Value Date/Time   COLORURINE STRAW (A) 02/24/2018 1611   APPEARANCEUR Clear 04/02/2022 1311   LABSPEC 1.004 (L) 02/24/2018 1611   PHURINE 7.0 02/24/2018 1611   GLUCOSEU Negative 04/02/2022 1311   HGBUR SMALL (A) 02/24/2018 1611   BILIRUBINUR Negative 04/02/2022 1311   KETONESUR NEGATIVE 02/24/2018 1611   PROTEINUR Negative 04/02/2022 1311   PROTEINUR NEGATIVE 02/24/2018 1611   UROBILINOGEN negative (A) 01/21/2020 1430   NITRITE Negative 04/02/2022 1311   NITRITE NEGATIVE 02/24/2018 1611   LEUKOCYTESUR Negative 04/02/2022 1311    Lab Results  Component Value Date   LABMICR See below: 04/02/2022   WBCUA None seen 04/02/2022   LABEPIT 0-10 04/02/2022   MUCUS Present 04/02/2022   BACTERIA None seen 04/02/2022    Pertinent Imaging:  No results found for this or any previous visit.  No results found for this or any previous visit.  No results found for this or any previous visit.  No results found for this or any previous visit.  No results found for this or any previous visit.  No valid procedures specified. No results found for this or any previous visit.  No results found for this or any previous visit.   Assessment & Plan:    1. Malignant neoplasm of prostate (HCC) -followup 1 year with PSA - Urinalysis, Routine w reflex microscopic  2. Benign prostatic hyperplasia with urinary obstruction Continue rapaflo and finasteride - silodosin (RAPAFLO) 8 MG CAPS capsule; Take 1 capsule (8 mg total) by mouth daily with breakfast.  Dispense: 30 capsule; Refill: 11 - finasteride (PROSCAR) 5 MG tablet; Take 1 tablet (5 mg total) by mouth daily.  Dispense: 90 tablet; Refill: 3  3. Weak urinary stream -continue rapaflo and finasteride   No follow-ups on file.  Wilkie Aye, MD  [facility] [place]

## 2023-04-11 ENCOUNTER — Other Ambulatory Visit: Payer: Self-pay

## 2023-04-12 ENCOUNTER — Encounter: Payer: Self-pay | Admitting: Gastroenterology

## 2023-04-28 ENCOUNTER — Encounter: Payer: Self-pay | Admitting: Urology

## 2023-04-28 NOTE — Patient Instructions (Signed)

## 2023-05-06 ENCOUNTER — Other Ambulatory Visit: Payer: Self-pay

## 2023-05-10 ENCOUNTER — Other Ambulatory Visit: Payer: Self-pay

## 2023-05-12 ENCOUNTER — Encounter: Payer: Self-pay | Admitting: Internal Medicine

## 2023-05-12 ENCOUNTER — Other Ambulatory Visit: Payer: Self-pay

## 2023-05-12 ENCOUNTER — Ambulatory Visit: Payer: Self-pay | Attending: Internal Medicine | Admitting: Internal Medicine

## 2023-05-12 VITALS — BP 128/84 | HR 106 | Temp 98.2°F | Ht 73.0 in | Wt 266.0 lb

## 2023-05-12 DIAGNOSIS — E782 Mixed hyperlipidemia: Secondary | ICD-10-CM

## 2023-05-12 DIAGNOSIS — Z6835 Body mass index (BMI) 35.0-35.9, adult: Secondary | ICD-10-CM

## 2023-05-12 DIAGNOSIS — R030 Elevated blood-pressure reading, without diagnosis of hypertension: Secondary | ICD-10-CM

## 2023-05-12 DIAGNOSIS — Z23 Encounter for immunization: Secondary | ICD-10-CM

## 2023-05-12 DIAGNOSIS — G4733 Obstructive sleep apnea (adult) (pediatric): Secondary | ICD-10-CM

## 2023-05-12 DIAGNOSIS — R7303 Prediabetes: Secondary | ICD-10-CM

## 2023-05-12 LAB — POCT GLYCOSYLATED HEMOGLOBIN (HGB A1C): HbA1c, POC (controlled diabetic range): 6.2 % (ref 0.0–7.0)

## 2023-05-12 LAB — GLUCOSE, POCT (MANUAL RESULT ENTRY): POC Glucose: 130 mg/dL — AB (ref 70–99)

## 2023-05-12 MED ORDER — ZOSTER VAC RECOMB ADJUVANTED 50 MCG/0.5ML IM SUSR
0.5000 mL | Freq: Once | INTRAMUSCULAR | 0 refills | Status: AC
Start: 2023-05-12 — End: 2023-05-12

## 2023-05-12 MED ORDER — METFORMIN HCL 500 MG PO TABS
250.0000 mg | ORAL_TABLET | Freq: Every day | ORAL | 1 refills | Status: DC
  Filled 2023-05-12: qty 45, 90d supply, fill #0
  Filled 2023-09-01 (×2): qty 45, 90d supply, fill #1

## 2023-05-12 NOTE — Progress Notes (Signed)
Patient ID: Roy Reed, male    DOB: 10-Aug-1956  MRN: [id number]  CC: Obstructive Sleep Apnea (Osa, pre-diabetes f/u. /No questions / concerns /Yes to flu vax. )   Subjective: Roy Reed is a 67 y.o. male who presents for chronic ds management. His concerns today include:  Pt with hx of pre-DM, obesity, HL, rotator cuff tear s/p RT shoulder arthroscopy and prostate CA (hormone and radiation seeds), obesity,  colon polyps, nonobst CAD.    [employer] Language interpreter used during this encounter. #[id number], [id number] Celine Use on stand by as pt sometimes does not understand some of my statements  OSA:  Did get CPAP machine with help of CW.  Using it consistently and wakes feeling more refresh, morning HA much less.  Needs new mask. Has very vivid dreams but when he wakes up, he does not recall the dream. Has been going on for a while. Wants to know if normal.  Sleeps well but would like to remember his dreams.  No sleep walking or acting out dreams in his sleep.   Obese/PreDM: Results for orders placed or performed in visit on 05/12/23  POCT glycosylated hemoglobin (Hb A1C)  Result Value Ref Range   Hemoglobin A1C     HbA1c POC (<> result, manual entry)     HbA1c, POC (prediabetic range)     HbA1c, POC (controlled diabetic range) 6.2 0.0 - 7.0 %  POCT glucose (manual entry)  Result Value Ref Range   POC Glucose 130 (A) 70 - 99 mg/dl  Wgh stable. Went to [place] for 2 mths and was not as active; came back end of July; slowly getting back into playing tennis since he got back Eating habits:  2 sold meals a day and fruits for snack Tolerating metformin 500 mg half a tablet daily.  Prostate CVA: Saw Dr. Ronne Binning the end of July.  PSA was undetectable.  HL:  taking and tolerating Lipitor 10 mg   HM: Due for second Shingrix vaccine, flu vaccine and COVID booster.  Due for colon cancer screening. Schedule 05/24/23 Patient Active Problem List   Diagnosis Date Noted   OSA  (obstructive sleep apnea) 12/27/2022   Benign prostatic hyperplasia with urinary obstruction 01/21/2020   Normocytic anemia 11/30/2019   Elevated blood pressure reading 11/30/2019   Dyspnea on exertion 11/30/2019   Prediabetes 11/30/2019   Hyperlipidemia 08/15/2018   Obesity (BMI 30-39.9) 08/15/2018   Skin tag 08/15/2018   Malignant neoplasm of prostate (HCC) 10/13/2017   Screening for malignant neoplasm of colon 08/31/2017   Personal history of gastric ulcer 02/09/2017     Current Outpatient Medications on File Prior to Visit  Medication Sig Dispense Refill   atorvastatin (LIPITOR) 10 MG tablet Take 1 tablet (10 mg total) by mouth daily. 90 tablet 1   finasteride (PROSCAR) 5 MG tablet Take 1 tablet (5 mg total) by mouth daily. 90 tablet 3   metFORMIN (GLUCOPHAGE) 500 MG tablet Take 0.5 tablets (250 mg total) by mouth daily with breakfast. 45 tablet 1   silodosin (RAPAFLO) 8 MG CAPS capsule Take 1 capsule (8 mg total) by mouth daily with breakfast. 30 capsule 11   tadalafil (CIALIS) 20 MG tablet Take 1 tablet (20 mg total) by mouth daily as needed. (Patient not taking: Reported on 11/08/2022) 10 tablet 5   No current facility-administered medications on file prior to visit.    Allergies  Allergen Reactions   Aspirin     States he can take if  he takes acid reducer medicine first    Social History   Socioeconomic History   Marital status: Married    Spouse name: Not on file   Number of children: 6   Years of education: Not on file   Highest education level: Not on file  Occupational History   Occupation: retired  Tobacco Use   Smoking status: Never   Smokeless tobacco: Never  Vaping Use   Vaping status: Never Used  Substance and Sexual Activity   Alcohol use: Not Currently   Drug use: No   Sexual activity: Not on file  Other Topics Concern   Not on file  Social History Narrative   Not on file   Social Determinants of Health   Financial Resource Strain: Not on file   Food Insecurity: Not on file  Transportation Needs: Not on file  Physical Activity: Not on file  Stress: Not on file  Social Connections: Not on file  Intimate Partner Violence: Not on file    No family history on file.  Past Surgical History:  Procedure Laterality Date   COLONOSCOPY     PROSTATE BIOPSY     PROSTATE BIOPSY     RADIOACTIVE SEED IMPLANT N/A 01/02/2018   Procedure: RADIOACTIVE SEED IMPLANT/BRACHYTHERAPY IMPLANT;  Surgeon: Malen Gauze, MD;  Location: [facility];  Service: Urology;  Laterality: N/A;   SHOULDER ARTHROSCOPY WITH ROTATOR CUFF REPAIR AND SUBACROMIAL DECOMPRESSION Right 08/05/2017   Procedure: RIGHT SHOULDER ARTHROSCOPY WITH ROTATOR CUFF REPAIR, DISTAL CLAVICLE EXCISION, DEBRIDEMENT AND SUBACROMIAL DECOMPRESSION;  Surgeon: Tarry Kos, MD;  Location: [facility];  Service: Orthopedics;  Laterality: Right;   SPACE OAR INSTILLATION N/A 01/02/2018   Procedure: SPACE OAR INSTILLATION;  Surgeon: Malen Gauze, MD;  Location: [facility];  Service: Urology;  Laterality: N/A;    ROS: Review of Systems Negative except as stated above  PHYSICAL EXAM: BP 132/78 (BP Location: Left Arm, Patient Position: Sitting, Cuff Size: Large)   Pulse (!) 106   Temp 98.2 F (36.8 C) (Oral)   Ht 6\' 1"  (1.854 m)   Wt 266 lb (120.7 kg)   SpO2 97%   BMI 35.09 kg/m   Wt Readings from Last 3 Encounters:  05/12/23 266 lb (120.7 kg)  12/27/22 267 lb (121.1 kg)  12/15/22 260 lb (117.9 kg)    Physical Exam   General appearance - alert, well appearing, and in no distress Mental status - normal mood, behavior, speech, dress, motor activity, and thought processes Neck - supple, no significant adenopathy Chest - clear to auscultation, no wheezes, rales or rhonchi, symmetric air entry Heart - normal rate, regular rhythm, normal S1, S2, no murmurs, rubs, clicks or gallops Extremities - peripheral pulses normal, no pedal  edema, no clubbing or cyanosis     Latest Ref Rng & Units 03/05/2022    4:36 PM 04/22/2021    2:58 PM 12/07/2019   12:23 PM  CMP  Glucose 70 - 99 mg/dL 80  77  161   BUN 8 - 27 mg/dL 13  13  15    Creatinine 0.76 - 1.27 mg/dL 0.96  0.45  4.09   Sodium 134 - 144 mmol/L 138  140  139   Potassium 3.5 - 5.2 mmol/L 4.3  4.7  4.8   Chloride 96 - 106 mmol/L 100  100  100   CO2 20 - 29 mmol/L 28  26  22    Calcium 8.6 - 10.2 mg/dL 9.9  9.2  9.6   Total Protein 6.0 - 8.5 g/dL 7.6  7.6    Total Bilirubin 0.0 - 1.2 mg/dL 0.3  0.4    Alkaline Phos 44 - 121 IU/L 79  91    AST 0 - 40 IU/L 36  22    ALT 0 - 44 IU/L 63  36     Lipid Panel     Component Value Date/Time   CHOL 164 11/09/2022 0907   TRIG 65 11/09/2022 0907   HDL 51 11/09/2022 0907   CHOLHDL 3.2 11/09/2022 0907   LDLCALC 100 (H) 11/09/2022 0907    CBC    Component Value Date/Time   WBC 5.4 03/05/2022 1636   WBC 5.2 12/26/2017 0853   RBC 4.98 03/05/2022 1636   RBC 4.77 12/26/2017 0853   HGB 13.4 03/05/2022 1636   HCT 41.2 03/05/2022 1636   PLT 248 03/05/2022 1636   MCV 83 03/05/2022 1636   MCH 26.9 03/05/2022 1636   MCH 27.0 12/26/2017 0853   MCHC 32.5 03/05/2022 1636   MCHC 32.7 12/26/2017 0853   RDW 12.4 03/05/2022 1636   LYMPHSABS 2.6 02/28/2017 1651   EOSABS 0.0 02/28/2017 1651   BASOSABS 0.0 02/28/2017 1651    ASSESSMENT AND PLAN: 1. Class 2 severe obesity due to excess calories with serious comorbidity and body mass index (BMI) of 35.0 to 35.9 in adult [facility]) Encouraged him to routine him of playing tennis 3 to 4 days a week.  Continue healthy eating habits. - CBC - Comprehensive metabolic panel  2. Prediabetes See above - POCT glycosylated hemoglobin (Hb A1C) - POCT glucose (manual entry) - metFORMIN (GLUCOPHAGE) 500 MG tablet; Take 0.5 tablets (250 mg total) by mouth daily with breakfast.  Dispense: 45 tablet; Refill: 1  3. Mixed hyperlipidemia Continue atorvastatin  4. OSA on CPAP Patient using and  benefiting from CPAP.  Message sent to case worker about him getting a new mask.  In regards to the vivid dreams, this can be observed for now.  5. Prehypertension DASH discussed.  Printed info given  6. Need for influenza vaccination Given today.  7. Need for shingles vaccine - Zoster Vaccine Adjuvanted [facility]) injection; Inject 0.5 mLs into the muscle once for 1 dose.  Dispense: 0.5 mL; Refill: 0     Patient was given the opportunity to ask questions.  Patient verbalized understanding of the plan and was able to repeat key elements of the plan.   This documentation was completed using Paediatric nurse.  Any transcriptional errors are unintentional.  Orders Placed This Encounter  Procedures   POCT glycosylated hemoglobin (Hb A1C)   POCT glucose (manual entry)     Requested Prescriptions    No prescriptions requested or ordered in this encounter    No follow-ups on file.  Jonah Blue, MD, FACP

## 2023-05-12 NOTE — Patient Instructions (Signed)
Rgime alimentaire DASH DASH Eating Plan DASH est l'abrviation de  Dietary Approaches to Stop Hypertension  (approches dittiques pour arrter l'hypertension). Le rgime alimentaire DASH est un plan d'alimentation sain qui s'est avr efficace pour : Faire baisser une tension artrielle leve (hypertension). Rduire [employer] de souffrir de diabte de type 2, de maladie cardiaque et d'accident vasculaire crbral. Publishing rights manager la perte de poids. Comment bien suivre ce rgime ? Lire les tiquettes affiches sur les emballages des produits alimentaires Vrifiez, sur les tiquettes des [employer] alimentaires, la quantit de sel (sodium) contenue dans chaque portion. Choisissez des aliments dont la valeur nutritionnelle de rfrence (VNR) en sodium est infrieure  5 %. En gnral, les aliments qui contiennent moins de 300 milligrammes (mg) de sodium par portion peuvent tre intgrs  ce plan d'alimentation. Pour trouver les crales compltes, Investment banker, corporate  complet(te)  en premire position [employer] ingrdients. Faire les courses Achetez des produits tiquets  faible teneur en sodium  ou  sans sel ajout . Achetez des aliments frais. vitez les aliments en conserve et les repas prpars ou surgels. Cuisiner Dans la [employer] possible, ne salez pas les plats que vous cuisinez. Utilisez des assaisonnements ou des herbes sans sel au lieu du sel de table ou du sel de mer. Consultez votre prestataire de soins de sant ou votre pharmacien avant d'utiliser des substituts de sel. Ne faites pas frire les aliments. Cuisinez les aliments en utilisant des modes de [address], par exemple, la [employer] four, le pochage, la McK[employer]on four ou sur le gril et le rtissage. Cuisinez en utilisant des [employer] cour. Vous pouvez, notamment, utiliser de [address], de [place], [place], de soja et de tournesol. Planifier les repas  Adoptez une alimentation  quilibre. Il est conseill de consommer : Au moins 4 portions de fruits et 4 portions de lgumes par jour. Essayez de remplir la moiti de votre assiette de fruits et de lgumes. 6  8 portions de crales compltes par jour. Tout au plus 6 portions de viande maigre, de volaille ou de poisson par [employer]. Une portion correspond  1 once (28 g). Une portion de viande de 3 onces (85 g) correspond  peu prs  la taille de la paume de la main. Un ouf quivaut  1 once (28 g). 2  3 portions de produits laitiers pauvres en matires grasses par [employer]. Une portion correspond  1 tasse (237 ml). 1 portion de fruits  coque, graines ou haricots secs 5 fois par semaine. 2  3 portions de graisses bonnes pour [employer]. Les graisses bonnes pour la sant, appeles acides gras omga-3, se trouvent dans des aliments tels que les noix, les graines de lin, les laits enrichis et les oufs. Ces graisses sont galement prsentes dans les poissons d'eaux froides, comme les sardines, le saumon et [employer]. Limitez votre consommation : D'aliments en conserve ou premballs. D'aliments riches en graisse trans, tels que les aliments frits. D'aliments riches en graisses satures, tels que la viande grasse. De desserts et sucreries, de boissons sucres et d'autres aliments contenant du sucre ajout. De produits laitiers entiers. N'ajoutez pas de sel  vos aliments avant de manger. Ne mangez pas plus de 4 jaunes d'ouf par [employer]. Essayez de consommer au moins 2 repas vgtariens par semaine. Privilgiez les aliments prpars  la maison aux aliments de restaurant, de buffet et de restauration rapide. Mode de vie Lorsque vous [employer] un restaurant, [employer] s'il  est possible de prparer votre repas avec moins de sel, voire sans sel. Si vous consommez de l'alcool : Limitez votre consommation  : 1 verre par jour si vous tes une femme. 2 verres par jour si vous tes un homme. Sachez quelle quantit d'alcool est  contenue dans votre verre. Aux [place], un verre correspond  une bouteille de bire de 355 ml (12 onces),  un verre de vin de 148 ml (5 onces) ou  un verre  liqueur d'alcool fort de 44 ml (1,5 once). Informations gnrales vitez de consommer plus de 2 300 mg de sel par jour. Si vous souffrez d'hypertension, vous devrez peut-tre limiter votre apport de sodium  1 500 mg par jour. Demandez l'aide de votre prestataire pour [employer] un poids sant ou pour [employer]. Demandez-lui de vous indiquer la fourchette de poids qui vous convient le mieux. Chaque jour ou presque, faites au moins 30 minutes d'exercice physique qui fasse battre votre cour plus rapidement. Vous pourriez, par [employer], Therapist, music, nager ou faire du vlo. Consultez votre prestataire ou  votre ditticien pour Walt Disney d'alimentation  vos besoins nergtiques. Quels aliments devrais-je manger ? Fruits Tous les fruits frais, schs ou congels. Fruits en conserve dans leur jus naturel et sans sucres ajouts. Lgumes Lgumes frais ou congels, crus,  la vapeur, rtis ou grills. Jus de tomates et de [employer] teneur en sodium ou  teneur en sodium rduite. Sauce tomate ou pte de tomates  faible teneur en sodium ou  teneur en sodium rduite. Lgumes en conserve  faible teneur en sodium ou  teneur en sodium rduite. Crales Pain au bl entier ou aux crales compltes. Ptes au bl entier ou aux crales compltes. Riz brun. Farine d'avoine. Quinoa. Boulgour. Crales compltes et pauvres en sodium. Pain pita. Craquelins pauvres en graisses et en sodium. Tortillas de farine de bl entier. Viandes et autres protines Poulet ou dinde sans la peau. Poulet ou dinde hachs. Porc dgraiss. Poisson et fruits de mer. Blancs d'oufs. Lgumineuses, lentilles ou pois secs. Fruits  coque non sals, beurres de fruits  coque et graines. Haricots secs en conserve non sals. Tranches maigres de bouf  dgraiss. Viandes prcuites et charcuterie [employer] teneur en sodium, comme les saucisses ou les pains de viande. Produits laitiers Lait  faible teneur en matire grasse (1 %) ou crm. Fromages  teneur en matires grasses rduite,  faible teneur en matires grasses ou sans matires grasses. Ricotta ou cottage cheese sans matires grasses et pauvres en sodium. [place] sans matires grasses ou pauvre en matire grasse. Fromage pauvre en matires grasses et en sodium. Graisses et huiles Margarine molle sans graisses trans. Huile vgtale. Mayonnaises et vinaigrettes  teneur en matires grasses rduite,  faible teneur en matires grasses ou allges ( teneur en sodium rduite). Huiles de colza, de carthame, d'olive, d'avocat, de soja et de tournesol. Avocats. Assaisonnements et condiments Aromates. pices. Mlanges d'assaisonnement sans sel. Autres aliments Popcorn et bretzels non sals. Bonbons sans [employer]. Les aliments et boissons mentionns ci-dessus ne constituent pas ncessairement une liste exhaustive de ceux que vous pouvez consommer. Pour de plus amples informations, consultez un ditticien. Quels aliments devrais-je viter ? Fruits Fruits en conserve dans un sirop lger ou pais. Fruits frits. Fruits dans une sauce  la crme ou au beurre. Lgumes Lgumes frits ou  la crme. Lgumes dans une sauce au fromage. Lgumes en conserve ordinaire et [employer] ne sont pas tiquets comme ayant [place]  teneur en sodium ou une teneur rduite en sodium. Sauces tomates et ptes ordinaires en conserve et [employer] ne sont pas tiquetes comme ayant une faible teneur en sodium ou une teneur rduite en sodium. Jus de tomates et de lgumes ordinaires et qui ne sont pas tiquets comme ayant une faible teneur en sodium ou une teneur rduite en sodium. Cornichons. Olives. Crales Produits de boulangerie contenant des matires grasses, comme les croissants, les muffins ou certains pains. Paquets  de ptes ou de riz lyophiliss. Viandes et autres protines Pices de viande grasses. Ctes. Viande frite. Tomasa Blase. Saucisson de [place], salami et autres viandes prcuites ou charcuterie, comme les saucisses ou les pains de viande, et qui ne sont ni maigres ni  faible teneur en sodium. Lard provenant du [employer] (bardire). Br[place]urst. Fruits  coque et Raytheon. Haricots secs en conserve contenant du sel ajout. Poisson en conserve ou fum. Oufs entiers ou jaunes d'oufs. Poulet ou dinde avec la peau. Produits laitiers Lait entier ou  2 %, crme et lait demi-crm. Fromages entiers ou gras. Yaourts entiers ou sucrs. Fromages gras. Crmes non laitires. Nappages fouetts. Tartinades de fromage et de fromage fondu. Graisses et [employer]. Margarine en btonnets. Lard. Graisse. Ghee. Gras de bacon. Huiles tropicales telles que l'huile de coco, de palmiste ou de palme. Assaisonnements et condiments Sel d'oignon et d'ail, sel assaisonn, sel de table et sel de mer. Sauce Worcestershire. Sauce tartare. Sauce barbecue. Sauce teriyaki. Sauces soja, y compris celles  teneur rduite en sodium. Sauce pour steak. Sauces en conserve et emballes. Sauce pour poisson. Sauce aux hutres. Sauce cocktail. Raifort achet en magasin. Ketchup. Moutarde. Armes et attendrisseurs de viande. Cubes de bouillon. Sauces piquantes. Marinades prpares ou premballes. Assaisonnements pour tacos prpars ou premballs. Relishs. Vinaigrettes ordinaires. Autres aliments Popcorn et bretzels sals. Les aliments et boissons mentionns ci-dessus ne constituent pas ncessairement une liste exhaustive de ceux que vous ne devriez pas consommer. Pour de plus amples informations, consultez un ditticien. Pour obtenir plus d'informations  BJ's, Lung, and Blood Institute (NHLBI) ([employer] cour, des poumons [employer] sang) : BuffaloDryCleaner.gl American Heart Association (AHA) (Association amricaine de cardiologie)  : heart.org Academy of Nutrition and Dietetics (Acadmie de nutrition et de dittique) : eatright.org National Kidney Foundation (NKF) (Fondation nationale du rein) : kidney.org Ces conseils et renseignements ne sauraient se substituer  l'avis mdical de votre prestataire de soins de sant. Par consquent, il est primordial de parler de toutes vos proccupations avec votre prestataire de soins de sant. Document Revised: 10/06/2022 Document Reviewed: 10/06/2022 Elsevier Patient Education  2024 [employer].

## 2023-05-13 LAB — COMPREHENSIVE METABOLIC PANEL
ALT: 45 IU/L — ABNORMAL HIGH (ref 0–44)
AST: 25 IU/L (ref 0–40)
Albumin: 4.2 g/dL (ref 3.9–4.9)
Alkaline Phosphatase: 84 IU/L (ref 44–121)
BUN/Creatinine Ratio: 14 (ref 10–24)
BUN: 14 mg/dL (ref 8–27)
Bilirubin Total: 0.2 mg/dL (ref 0.0–1.2)
CO2: 26 mmol/L (ref 20–29)
Calcium: 9.7 mg/dL (ref 8.6–10.2)
Chloride: 100 mmol/L (ref 96–106)
Creatinine, Ser: 1 mg/dL (ref 0.76–1.27)
Globulin, Total: 3.2 g/dL (ref 1.5–4.5)
Glucose: 104 mg/dL — ABNORMAL HIGH (ref 70–99)
Potassium: 4.5 mmol/L (ref 3.5–5.2)
Sodium: 141 mmol/L (ref 134–144)
Total Protein: 7.4 g/dL (ref 6.0–8.5)
eGFR: 82 mL/min/{1.73_m2} (ref >59)

## 2023-05-13 LAB — CBC
Hematocrit: 42 % (ref 37.5–51.0)
Hemoglobin: 13.6 g/dL (ref 13.0–17.7)
MCH: 27.3 pg (ref 26.6–33.0)
MCHC: 32.4 g/dL (ref 31.5–35.7)
MCV: 84 fL (ref 79–97)
Platelets: 266 10*3/uL (ref 150–450)
RBC: 4.98 x10E6/uL (ref 4.14–5.80)
RDW: 12.6 % (ref 11.6–15.4)
WBC: 4.7 10*3/uL (ref 3.4–10.8)

## 2023-05-19 ENCOUNTER — Telehealth: Payer: Self-pay

## 2023-05-19 NOTE — Telephone Encounter (Signed)
At the request of Dr Laural Benes, I called the patient about obtaining CPAP masks.  Call was placed with assistance of [place] interpreter: [id number]/[employer]   I explained to the patient that the American Sleep Apnea Association CPAP Assistance Program offers masks for $25.  I also reminded him that this clinic purchased the CPAP machine for him but we do not purchase masks.  He said he understood.  I explained that he can apply for the masks on-line and I can send him the link through MyChart.  He stated he can do that.  I asked if he would be able to have someone who reads/ understands English assist him.  He said he is able to read [place], he is just not able to speak [place].  I sent him a MyChart message with the link and instructed him to call me with any questions.

## 2023-05-22 ENCOUNTER — Telehealth: Payer: Self-pay

## 2023-05-22 DIAGNOSIS — G4733 Obstructive sleep apnea (adult) (pediatric): Secondary | ICD-10-CM

## 2023-05-23 NOTE — Telephone Encounter (Signed)
Dr Laural Benes,  The patient ordered the CPAP masks but the [facility] needs a prescription.  He would like the Full Face Mask - large.   I will send them the rx it's ready.  thanks

## 2023-05-23 NOTE — Telephone Encounter (Signed)
Rx has been sent to [facility]

## 2023-05-24 ENCOUNTER — Ambulatory Visit (AMBULATORY_SURGERY_CENTER): Payer: No Typology Code available for payment source

## 2023-05-24 VITALS — Ht 73.0 in | Wt 265.5 lb

## 2023-05-24 DIAGNOSIS — Z86010 Personal history of colonic polyps: Secondary | ICD-10-CM

## 2023-05-24 NOTE — Progress Notes (Signed)
No egg or soy allergy known to patient  No issues known to pt with past sedation with any surgeries or procedures Patient denies ever being told they had issues or difficulty with intubation  No FH of Malignant Hyperthermia Pt is not on diet pills Pt is not on  home 02  Pt is not on blood thinners  Pt denies issues with constipation  No A fib or A flutter Have any cardiac testing pending--no  LOA: independent  Prep: spilt dose miralax   Patient's chart reviewed by Cathlyn Parsons CNRA prior to previsit and patient appropriate for the [facility].  Previsit completed and red dot placed by patient's name on their procedure day (on provider's schedule).     PV competed with patient with his wife and interpreter services. Prep instructions sent via mychartand hard copy given at [facility].

## 2023-06-06 ENCOUNTER — Other Ambulatory Visit: Payer: Self-pay

## 2023-06-14 ENCOUNTER — Ambulatory Visit: Payer: No Typology Code available for payment source | Admitting: Gastroenterology

## 2023-06-14 ENCOUNTER — Encounter: Payer: Self-pay | Admitting: Gastroenterology

## 2023-06-14 VITALS — BP 118/73 | HR 70 | Temp 99.1°F | Resp 15 | Ht 73.0 in | Wt 265.6 lb

## 2023-06-14 DIAGNOSIS — Z09 Encounter for follow-up examination after completed treatment for conditions other than malignant neoplasm: Secondary | ICD-10-CM

## 2023-06-14 DIAGNOSIS — D123 Benign neoplasm of transverse colon: Secondary | ICD-10-CM

## 2023-06-14 DIAGNOSIS — D124 Benign neoplasm of descending colon: Secondary | ICD-10-CM

## 2023-06-14 DIAGNOSIS — Z860101 Personal history of adenomatous and serrated colon polyps: Secondary | ICD-10-CM

## 2023-06-14 DIAGNOSIS — Z1211 Encounter for screening for malignant neoplasm of colon: Secondary | ICD-10-CM

## 2023-06-14 MED ORDER — SODIUM CHLORIDE 0.9 % IV SOLN: 500.0000 mL | Freq: Once | INTRAVENOUS | Status: DC

## 2023-06-14 NOTE — Op Note (Signed)
[facility] Patient Name: Roy Reed Procedure Date: 06/14/2023 2:36 PM MRN: [id number] Endoscopist: Corliss Parish , MD, [id number] Age: 67 Referring MD:  Date of Birth: April 30, 1956 Gender: Male Account #: [id number] Procedure:                Colonoscopy Indications:              Surveillance: Personal history of adenomatous                            polyps on last colonoscopy 3 years ago Medicines:                Monitored Anesthesia Care Procedure:                Pre-Anesthesia Assessment:                           - Prior to the procedure, a History and Physical                            was performed, and patient medications and                            allergies were reviewed. The patient's tolerance of                            previous anesthesia was also reviewed. The risks                            and benefits of the procedure and the sedation                            options and risks were discussed with the patient.                            All questions were answered, and informed consent                            was obtained. Prior Anticoagulants: The patient has                            taken no anticoagulant or antiplatelet agents. ASA                            Grade Assessment: II - A patient with mild systemic                            disease. After reviewing the risks and benefits,                            the patient was deemed in satisfactory condition to                            undergo the procedure.  After obtaining informed consent, the colonoscope                            was passed under direct vision. Throughout the                            procedure, the patient's blood pressure, pulse, and                            oxygen saturations were monitored continuously. The                            Olympus Scope SN: [id number] was introduced through                            the anus and  advanced to the the cecum, identified                            by appendiceal orifice and ileocecal valve. The                            colonoscopy was performed without difficulty. The                            patient tolerated the procedure. The quality of the                            bowel preparation was adequate. The ileocecal                            valve, appendiceal orifice, and rectum were                            photographed. Scope In: 2:42:24 PM Scope Out: 2:58:41 PM Scope Withdrawal Time: 0 hours 11 minutes 47 seconds  Total Procedure Duration: 0 hours 16 minutes 17 seconds  Findings:                 The digital rectal exam findings include                            hemorrhoids. Pertinent negatives include no                            palpable rectal lesions.                           The colon (entire examined portion) was moderately                            redundant.                           Two sessile polyps were found in the descending  colon and transverse colon. The polyps were 4 to 5                            mm in size. These polyps were removed with a cold                            snare. Resection and retrieval were complete.                           Normal mucosa was found in the entire colon.                           Non-bleeding non-thrombosed internal hemorrhoids                            were found during retroflexion, during perianal                            exam and during digital exam. The hemorrhoids were                            Grade II (internal hemorrhoids that prolapse but                            reduce spontaneously). Complications:            No immediate complications. Estimated Blood Loss:     Estimated blood loss was minimal. Impression:               - Hemorrhoids found on digital rectal exam.                           - Redundant colon.                           - Two 4 to 5 mm polyps  in the descending colon and                            in the transverse colon, removed with a cold snare.                            Resected and retrieved.                           - Normal mucosa in the entire examined colon.                           - Non-bleeding non-thrombosed internal hemorrhoids. Recommendation:           - The patient will be observed post-procedure,                            until all discharge criteria are met.                           -  Discharge patient to home.                           - Patient has a contact number available for                            emergencies. The signs and symptoms of potential                            delayed complications were discussed with the                            patient. Return to normal activities tomorrow.                            Written discharge instructions were provided to the                            patient.                           - High fiber diet.                           - Use FiberCon 1-2 tablets PO daily.                           - Continue present medications.                           - Await pathology results.                           - Repeat colonoscopy in 5-7 years for surveillance.                           - The findings and recommendations were discussed                            with the patient.                           - The findings and recommendations were discussed                            with the designated responsible adult. Corliss Parish, MD 06/14/2023 3:02:56 PM

## 2023-06-14 NOTE — Patient Instructions (Addendum)
- High fiber diet.                           - Use FiberCon 1-2 tablets PO daily.                           - Continue present medications.                           - Await pathology results.                           - Repeat colonoscopy in 5-7 years for surveillance.                           - The findings and recommendations were discussed                            with the patient.                           - The findings and recommendations were discussed                            with the designated responsible adult.   YOU HAD AN ENDOSCOPIC PROCEDURE TODAY AT THE [facility]:   Refer to the procedure report that was given to you for any specific questions about what was found during the examination.  If the procedure report does not answer your questions, please call your gastroenterologist to clarify.  If you requested that your care partner not be given the details of your procedure findings, then the procedure report has been included in a sealed envelope for you to review at your convenience later.  YOU SHOULD EXPECT: Some feelings of bloating in the abdomen. Passage of more gas than usual.  Walking can help get rid of the air that was put into your GI tract during the procedure and reduce the bloating. If you had a lower endoscopy (such as a colonoscopy or flexible sigmoidoscopy) you may notice spotting of blood in your stool or on the toilet paper. If you underwent a bowel prep for your procedure, you may not have a normal bowel movement for a few days.  Please Note:  You might notice some irritation and congestion in your nose or some drainage.  This is from the oxygen used during your procedure.  There is no need for concern and it should clear up in a day or so.  SYMPTOMS TO REPORT IMMEDIATELY:  Following lower endoscopy (colonoscopy or flexible sigmoidoscopy):  Excessive amounts of blood in the stool  Significant tenderness or worsening  of abdominal pains  Swelling of the abdomen that is new, acute  Fever of 100F or higher   For urgent or emergent issues, a gastroenterologist can be reached at any hour by calling [phone]. Do not use MyChart messaging for urgent concerns.    DIET:  We do recommend a small meal at first, but then you may proceed to your regular diet.  Drink plenty of fluids but you should avoid alcoholic beverages for 24 hours.  ACTIVITY:  You should plan to take it easy for the rest  of today and you should NOT DRIVE or use heavy machinery until tomorrow (because of the sedation medicines used during the test).    FOLLOW UP: Our staff will call the number listed on your records the next business day following your procedure.  We will call around 7:15- 8:00 am to check on you and address any questions or concerns that you may have regarding the information given to you following your procedure. If we do not reach you, we will leave a message.     If any biopsies were taken you will be contacted by phone or by letter within the next 1-3 weeks.  Please call us at [phone] if you have not heard about the biopsies in 3 weeks.    SIGNATURES/CONFIDENTIALITY: You and/or your care partner have signed paperwork which will be entered into your electronic medical record.  These signatures attest to the fact that that the information above on your After Visit Summary has been reviewed and is understood.  Full responsibility of the confidentiality of this discharge information lies with you and/or your care-partner.

## 2023-06-14 NOTE — Progress Notes (Signed)
Sedate, gd SR, tolerated procedure well, VSS, report to RN 

## 2023-06-14 NOTE — Progress Notes (Signed)
Called to room to assist during endoscopic procedure.  Patient ID and intended procedure confirmed with present staff. Received instructions for my participation in the procedure from the performing physician.  

## 2023-06-14 NOTE — Progress Notes (Signed)
Pt reports no new  changes In medical history since last pre-visit.

## 2023-06-14 NOTE — Progress Notes (Signed)
GASTROENTEROLOGY PROCEDURE H&P NOTE   Primary Care Physician: Marcine Matar, MD  HPI: Roy Reed is a 67 y.o. male who presents for Colonoscopy for surveillance of previous polyps.  Past Medical History:  Diagnosis Date   Diabetes mellitus without complication (HCC)    Elevated PSA    HLD (hyperlipidemia)    side effect from Tamsulosin   Left knee pain    Low back pain    Pre-diabetes    Prostate cancer (HCC)    Rotator cuff disorder, right    Rotator cuff tear arthropathy of right shoulder 08/31/2017   Sleep apnea    Past Surgical History:  Procedure Laterality Date   COLONOSCOPY     PROSTATE BIOPSY     PROSTATE BIOPSY     RADIOACTIVE SEED IMPLANT N/A 01/02/2018   Procedure: RADIOACTIVE SEED IMPLANT/BRACHYTHERAPY IMPLANT;  Surgeon: Malen Gauze, MD;  Location: [facility];  Service: Urology;  Laterality: N/A;   SHOULDER ARTHROSCOPY WITH ROTATOR CUFF REPAIR AND SUBACROMIAL DECOMPRESSION Right 08/05/2017   Procedure: RIGHT SHOULDER ARTHROSCOPY WITH ROTATOR CUFF REPAIR, DISTAL CLAVICLE EXCISION, DEBRIDEMENT AND SUBACROMIAL DECOMPRESSION;  Surgeon: Tarry Kos, MD;  Location: [facility];  Service: Orthopedics;  Laterality: Right;   SPACE OAR INSTILLATION N/A 01/02/2018   Procedure: SPACE OAR INSTILLATION;  Surgeon: Malen Gauze, MD;  Location: [facility];  Service: Urology;  Laterality: N/A;   Current Outpatient Medications  Medication Sig Dispense Refill   atorvastatin (LIPITOR) 10 MG tablet Take 1 tablet (10 mg total) by mouth daily. 90 tablet 1   finasteride (PROSCAR) 5 MG tablet Take 1 tablet (5 mg total) by mouth daily. 90 tablet 3   metFORMIN (GLUCOPHAGE) 500 MG tablet Take 0.5 tablets (250 mg total) by mouth daily with breakfast. 45 tablet 1   silodosin (RAPAFLO) 8 MG CAPS capsule Take 1 capsule (8 mg total) by mouth daily with breakfast. 30 capsule 11   tadalafil (CIALIS) 20 MG tablet Take 1 tablet  (20 mg total) by mouth daily as needed. (Patient not taking: Reported on 11/08/2022) 10 tablet 5   No current facility-administered medications for this visit.    Current Outpatient Medications:    atorvastatin (LIPITOR) 10 MG tablet, Take 1 tablet (10 mg total) by mouth daily., Disp: 90 tablet, Rfl: 1   finasteride (PROSCAR) 5 MG tablet, Take 1 tablet (5 mg total) by mouth daily., Disp: 90 tablet, Rfl: 3   metFORMIN (GLUCOPHAGE) 500 MG tablet, Take 0.5 tablets (250 mg total) by mouth daily with breakfast., Disp: 45 tablet, Rfl: 1   silodosin (RAPAFLO) 8 MG CAPS capsule, Take 1 capsule (8 mg total) by mouth daily with breakfast., Disp: 30 capsule, Rfl: 11   tadalafil (CIALIS) 20 MG tablet, Take 1 tablet (20 mg total) by mouth daily as needed. (Patient not taking: Reported on 11/08/2022), Disp: 10 tablet, Rfl: 5 Allergies  Allergen Reactions   Aspirin     States he can take if he takes acid reducer medicine first. Not taking    Family History  Problem Relation Age of Onset   Colon cancer Neg Hx    Colon polyps Neg Hx    Esophageal cancer Neg Hx    Rectal cancer Neg Hx    Stomach cancer Neg Hx    Social History   Socioeconomic History   Marital status: Married    Spouse name: Not on file   Number of children: 6   Years of education: Not  on file   Highest education level: Not on file  Occupational History   Occupation: retired  Tobacco Use   Smoking status: Never   Smokeless tobacco: Never  Vaping Use   Vaping status: Never Used  Substance and Sexual Activity   Alcohol use: Not Currently   Drug use: No   Sexual activity: Not on file  Other Topics Concern   Not on file  Social History Narrative   Not on file   Social Determinants of Health   Financial Resource Strain: Not on file  Food Insecurity: Not on file  Transportation Needs: Not on file  Physical Activity: Not on file  Stress: Not on file  Social Connections: Not on file  Intimate Partner Violence: Not on file     Physical Exam: There were no vitals filed for this visit. There is no height or weight on file to calculate BMI. GEN: NAD EYE: Sclerae anicteric ENT: MMM CV: Non-tachycardic GI: Soft, NT/ND NEURO:  Alert & Oriented x 3  Lab Results: No results for input(s): "WBC", "HGB", "HCT", "PLT" in the last 72 hours. BMET No results for input(s): "NA", "K", "CL", "CO2", "GLUCOSE", "BUN", "CREATININE", "CALCIUM" in the last 72 hours. LFT No results for input(s): "PROT", "ALBUMIN", "AST", "ALT", "ALKPHOS", "BILITOT", "BILIDIR", "IBILI" in the last 72 hours. PT/INR No results for input(s): "LABPROT", "INR" in the last 72 hours.   Impression / Plan: This is a 67 y.o.male who presents for Colonoscopy for surveillance of previous polyps.  The risks of an EUS including intestinal perforation, bleeding, infection, aspiration, and medication effects were discussed as was the possibility it may not give a definitive diagnosis if a biopsy is performed.  When a biopsy of the pancreas is done as part of the EUS, there is an additional risk of pancreatitis at the rate of about 1-2%.  It was explained that procedure related pancreatitis is typically mild, although it can be severe and even life threatening, which is why we do not perform random pancreatic biopsies and only biopsy a lesion/area we feel is concerning enough to warrant the risk.   The risks and benefits of endoscopic evaluation/treatment were discussed with the patient and/or family; these include but are not limited to the risk of perforation, infection, bleeding, missed lesions, lack of diagnosis, severe illness requiring hospitalization, as well as anesthesia and sedation related illnesses.  The patient's history has been reviewed, patient examined, no change in status, and deemed stable for procedure.  The patient and/or family is agreeable to proceed.    Corliss Parish, MD [facility] Office #  [phone]

## 2023-06-15 ENCOUNTER — Telehealth: Payer: Self-pay

## 2023-06-15 NOTE — Telephone Encounter (Signed)
  Follow up Call-     06/14/2023    2:01 PM  Call back number  Post procedure Call Back phone  # [phone]  Permission to leave phone message Yes     Patient questions:  Do you have a fever, pain , or abdominal swelling? No. Pain Score  0 *  Have you tolerated food without any problems? Yes.    Have you been able to return to your normal activities? Yes.    Do you have any questions about your discharge instructions: Diet   No. Medications  No. Follow up visit  No.  Do you have questions or concerns about your Care? No.  Actions: * If pain score is 4 or above: No action needed, pain <4.

## 2023-06-17 ENCOUNTER — Encounter: Payer: Self-pay | Admitting: Gastroenterology

## 2023-06-17 LAB — SURGICAL PATHOLOGY

## 2023-07-05 ENCOUNTER — Other Ambulatory Visit: Payer: Self-pay

## 2023-07-08 ENCOUNTER — Other Ambulatory Visit: Payer: Self-pay

## 2023-08-03 ENCOUNTER — Other Ambulatory Visit: Payer: Self-pay

## 2023-08-12 ENCOUNTER — Other Ambulatory Visit: Payer: Self-pay

## 2023-09-01 ENCOUNTER — Other Ambulatory Visit: Payer: Self-pay

## 2023-09-02 ENCOUNTER — Other Ambulatory Visit: Payer: Self-pay

## 2023-09-06 ENCOUNTER — Other Ambulatory Visit: Payer: Self-pay

## 2023-09-06 ENCOUNTER — Other Ambulatory Visit: Payer: Self-pay | Admitting: Internal Medicine

## 2023-09-06 MED ORDER — ATORVASTATIN CALCIUM 10 MG PO TABS
10.0000 mg | ORAL_TABLET | Freq: Every day | ORAL | 0 refills | Status: DC
  Filled 2023-09-06: qty 90, 90d supply, fill #0

## 2023-09-12 ENCOUNTER — Other Ambulatory Visit: Payer: Self-pay

## 2023-09-12 ENCOUNTER — Ambulatory Visit: Payer: Self-pay | Attending: Internal Medicine | Admitting: Internal Medicine

## 2023-09-12 ENCOUNTER — Encounter: Payer: Self-pay | Admitting: Internal Medicine

## 2023-09-12 VITALS — BP 124/80 | HR 87 | Temp 98.0°F | Ht 73.0 in | Wt 263.0 lb

## 2023-09-12 DIAGNOSIS — R7303 Prediabetes: Secondary | ICD-10-CM

## 2023-09-12 DIAGNOSIS — E66811 Obesity, class 1: Secondary | ICD-10-CM

## 2023-09-12 DIAGNOSIS — G4733 Obstructive sleep apnea (adult) (pediatric): Secondary | ICD-10-CM

## 2023-09-12 DIAGNOSIS — Z23 Encounter for immunization: Secondary | ICD-10-CM

## 2023-09-12 DIAGNOSIS — E782 Mixed hyperlipidemia: Secondary | ICD-10-CM

## 2023-09-12 MED ORDER — ZOSTER VAC RECOMB ADJUVANTED 50 MCG/0.5ML IM SUSR
0.5000 mL | Freq: Once | INTRAMUSCULAR | 0 refills | Status: AC
Start: 2023-09-12 — End: 2023-09-12

## 2023-09-12 MED ORDER — ZOSTER VAC RECOMB ADJUVANTED 50 MCG/0.5ML IM SUSR
0.5000 mL | Freq: Once | INTRAMUSCULAR | 0 refills | Status: AC
  Filled 2023-09-12: qty 1, 1d supply, fill #0

## 2023-09-12 NOTE — Progress Notes (Signed)
 Patient ID: Roy Reed, male    DOB: Aug 23, 1956  MRN: [id number]  CC: Obstructive Sleep Apnea (OSA f/u./Discuss weight loss options /Yes to shingles vax. )   Subjective: Roy Reed is a 68 y.o. male who presents for chronic ds management. His concerns today include:  Pt with hx of pre-DM, obesity, HL, rotator cuff tear s/p RT shoulder arthroscopy and prostate CA (hormone and radiation seeds), obesity,  colon polyps, nonobst CAD.    AM[employer] Language interpreter used during this encounter. #[id number]  Discussed the use of AI scribe software for clinical note transcription with the patient, who gave verbal consent to proceed.  History of Present Illness   The patient, with a history of sleep apnea and obesity, presents with concerns about his CPAP machine and weight loss. He reports that he is still using his CPAP machine and feels he is benefitting from it. However, he has noticed that the mask sometimes lets air out during the night, which he believes is due to the positioning of his head. He was referred for a new mask at his last visit and confirms that he was contacted and did get it.  Reports being told when he went to pick up the mask that sometimes there may be a little bit of air leakage depending on positioning at night.  Obesity/PreDM:  In terms of his weight, he has not noticed any significant weight loss since his last visit in September. He reports that he controlled his eating over the holiday period but did not exercise much. He expresses a preference for losing weight naturally rather than using medications.  HL:  He confirms that he is still taking and tolerating his cholesterol medication, Lipitor 10 mg daily.      HM:  given rxn last visit to get 2nd Shingrix  vaccine from our pharmacy. He did not get it. Patient Active Problem List   Diagnosis Date Noted   OSA (obstructive sleep apnea) 12/27/2022   Benign prostatic hyperplasia with urinary obstruction  01/21/2020   Normocytic anemia 11/30/2019   Elevated blood pressure reading 11/30/2019   Dyspnea on exertion 11/30/2019   Prediabetes 11/30/2019   Hyperlipidemia 08/15/2018   Obesity (BMI 30-39.9) 08/15/2018   Skin tag 08/15/2018   Malignant neoplasm of prostate (HCC) 10/13/2017   Screening for malignant neoplasm of colon 08/31/2017   Personal history of gastric ulcer 02/09/2017     Current Outpatient Medications on File Prior to Visit  Medication Sig Dispense Refill   atorvastatin  (LIPITOR) 10 MG tablet Take 1 tablet (10 mg total) by mouth daily. 90 tablet 0   finasteride  (PROSCAR ) 5 MG tablet Take 1 tablet (5 mg total) by mouth daily. 90 tablet 3   metFORMIN  (GLUCOPHAGE ) 500 MG tablet Take 0.5 tablets (250 mg total) by mouth daily with breakfast. 45 tablet 1   silodosin  (RAPAFLO ) 8 MG CAPS capsule Take 1 capsule (8 mg total) by mouth daily with breakfast. 30 capsule 11   tadalafil  (CIALIS ) 20 MG tablet Take 1 tablet (20 mg total) by mouth daily as needed. (Patient not taking: Reported on 09/12/2023) 10 tablet 5   No current facility-administered medications on file prior to visit.    Allergies  Allergen Reactions   Aspirin     States he can take if he takes acid reducer medicine first. Not taking     Social History   Socioeconomic History   Marital status: Married    Spouse name: Not on file   Number of  children: 6   Years of education: Not on file   Highest education level: Not on file  Occupational History   Occupation: retired  Tobacco Use   Smoking status: Never   Smokeless tobacco: Never  Vaping Use   Vaping status: Never Used  Substance and Sexual Activity   Alcohol use: Not Currently   Drug use: No   Sexual activity: Not on file  Other Topics Concern   Not on file  Social History Narrative   Not on file   Social Drivers of Health   Financial Resource Strain: Low Risk  (09/12/2023)   Overall Financial Resource Strain (CARDIA)    Difficulty of Paying  Living Expenses: Not hard at all  Food Insecurity: No Food Insecurity (09/12/2023)   Hunger Vital Sign    Worried About Running Out of Food in the Last Year: Never true    Ran Out of Food in the Last Year: Never true  Transportation Needs: No Transportation Needs (09/12/2023)   PRAPARE - Administrator, Civil Service (Medical): No    Lack of Transportation (Non-Medical): No  Physical Activity: Insufficiently Active (09/12/2023)   Exercise Vital Sign    Days of Exercise per Week: 3 days    Minutes of Exercise per Session: 30 min  Stress: No Stress Concern Present (09/12/2023)   [employer] of Occupational Health - Occupational Stress Questionnaire    Feeling of Stress : Not at all  Social Connections: Socially Integrated (09/12/2023)   Social Connection and Isolation Panel [NHANES]    Frequency of Communication with Friends and Family: More than three times a week    Frequency of Social Gatherings with Friends and Family: More than three times a week    Attends Religious Services: 1 to 4 times per year    Active Member of [employer] or Organizations: Yes    Attends Banker Meetings: More than 4 times per year    Marital Status: Married  Catering Manager Violence: Not At Risk (09/12/2023)   Humiliation, Afraid, Rape, and Kick questionnaire    Fear of Current or Ex-Partner: No    Emotionally Abused: No    Physically Abused: No    Sexually Abused: No    Family History  Problem Relation Age of Onset   Colon cancer Neg Hx    Colon polyps Neg Hx    Esophageal cancer Neg Hx    Rectal cancer Neg Hx    Stomach cancer Neg Hx     Past Surgical History:  Procedure Laterality Date   COLONOSCOPY     PROSTATE BIOPSY     PROSTATE BIOPSY     RADIOACTIVE SEED IMPLANT N/A 01/02/2018   Procedure: RADIOACTIVE SEED IMPLANT/BRACHYTHERAPY IMPLANT;  Surgeon: Sherrilee Belvie CROME, MD;  Location: [facility] [place];  Service: Urology;  Laterality: N/A;   SHOULDER ARTHROSCOPY  WITH ROTATOR CUFF REPAIR AND SUBACROMIAL DECOMPRESSION Right 08/05/2017   Procedure: RIGHT SHOULDER ARTHROSCOPY WITH ROTATOR CUFF REPAIR, DISTAL CLAVICLE EXCISION, DEBRIDEMENT AND SUBACROMIAL DECOMPRESSION;  Surgeon: Jerri Kay HERO, MD;  Location: [facility];  Service: Orthopedics;  Laterality: Right;   SPACE OAR INSTILLATION N/A 01/02/2018   Procedure: SPACE OAR INSTILLATION;  Surgeon: Sherrilee Belvie CROME, MD;  Location: [facility];  Service: Urology;  Laterality: N/A;    ROS: Review of Systems Negative except as stated above  PHYSICAL EXAM: BP 124/80   Pulse 87   Temp 98 F (36.7 C) (Oral)   Ht 6'  1 (1.854 m)   Wt 263 lb (119.3 kg)   SpO2 97%   BMI 34.70 kg/m   Wt Readings from Last 3 Encounters:  09/12/23 263 lb (119.3 kg)  06/14/23 265 lb 9.6 oz (120.5 kg)  05/24/23 265 lb 8 oz (120.4 kg)    Physical Exam  General appearance - alert, well appearing, older African male and in no distress Mental status - normal mood, behavior, speech, dress, motor activity, and thought processes Neck - supple, no significant adenopathy Chest - clear to auscultation, no wheezes, rales or rhonchi, symmetric air entry Heart - normal rate, regular rhythm, normal S1, S2, no murmurs, rubs, clicks or gallops Extremities - peripheral pulses normal, no pedal edema, no clubbing or cyanosis     Latest Ref Rng & Units 05/12/2023    2:31 PM 03/05/2022    4:36 PM 04/22/2021    2:58 PM  CMP  Glucose 70 - 99 mg/dL 895  80  77   BUN 8 - 27 mg/dL 14  13  13    Creatinine 0.76 - 1.27 mg/dL 8.99  8.78  8.88   Sodium 134 - 144 mmol/L 141  138  140   Potassium 3.5 - 5.2 mmol/L 4.5  4.3  4.7   Chloride 96 - 106 mmol/L 100  100  100   CO2 20 - 29 mmol/L 26  28  26    Calcium  8.6 - 10.2 mg/dL 9.7  9.9  9.2   Total Protein 6.0 - 8.5 g/dL 7.4  7.6  7.6   Total Bilirubin 0.0 - 1.2 mg/dL 0.2  0.3  0.4   Alkaline Phos 44 - 121 IU/L 84  79  91   AST 0 - 40 IU/L 25  36  22   ALT 0 - 44  IU/L 45  63  36    Lipid Panel     Component Value Date/Time   CHOL 164 11/09/2022 0907   TRIG 65 11/09/2022 0907   HDL 51 11/09/2022 0907   CHOLHDL 3.2 11/09/2022 0907   LDLCALC 100 (H) 11/09/2022 0907    CBC    Component Value Date/Time   WBC 4.7 05/12/2023 1431   WBC 5.2 12/26/2017 0853   RBC 4.98 05/12/2023 1431   RBC 4.77 12/26/2017 0853   HGB 13.6 05/12/2023 1431   HCT 42.0 05/12/2023 1431   PLT 266 05/12/2023 1431   MCV 84 05/12/2023 1431   MCH 27.3 05/12/2023 1431   MCH 27.0 12/26/2017 0853   MCHC 32.4 05/12/2023 1431   MCHC 32.7 12/26/2017 0853   RDW 12.6 05/12/2023 1431   LYMPHSABS 2.6 02/28/2017 1651   EOSABS 0.0 02/28/2017 1651   BASOSABS 0.0 02/28/2017 1651    ASSESSMENT AND PLAN: 1. OSA on CPAP (Primary) Encouraged him to continue using his CPAP every night.  Advised that sometimes he may have a little bit of air leakage depending on positioning of his head.  2. Obesity (BMI 30.0-34.9) 3. Prediabetes Commended him on controlling his eating habits.  Encouraged him to start exercising again at least 3-4 times a week.  We discussed medications like Ozempic or Mounjaro but there is no patient assistance program that we have at this time for these medicines for obesity.  Patient states that he is not interested in taking medications.  He would prefer to try to lose weight naturally.  4. Mixed hyperlipidemia Continue atorvastatin .  5. Need for shingles vaccine - Zoster Vaccine Adjuvanted (SHINGRIX ) injection; Inject 0.5 mLs into the  muscle once for 1 dose.  Dispense: 0.5 mL; Refill: 0     There are no diagnoses linked to this encounter.   Patient was given the opportunity to ask questions.  Patient verbalized understanding of the plan and was able to repeat key elements of the plan.   This documentation was completed using Paediatric nurse.  Any transcriptional errors are unintentional.  No orders of the defined types were placed in  this encounter.    Requested Prescriptions   Signed Prescriptions Disp Refills   Zoster Vaccine Adjuvanted (SHINGRIX ) injection 0.5 mL 0    Sig: Inject 0.5 mLs into the muscle once for 1 dose.    Return in about 4 months (around 01/10/2024).  Barnie Louder, MD, FACP

## 2023-10-07 ENCOUNTER — Other Ambulatory Visit: Payer: Self-pay

## 2023-10-10 ENCOUNTER — Other Ambulatory Visit: Payer: Self-pay

## 2023-11-14 ENCOUNTER — Other Ambulatory Visit: Payer: Self-pay

## 2023-11-21 ENCOUNTER — Other Ambulatory Visit: Payer: Self-pay | Admitting: Internal Medicine

## 2023-11-21 ENCOUNTER — Other Ambulatory Visit: Payer: Self-pay

## 2023-11-21 DIAGNOSIS — R7303 Prediabetes: Secondary | ICD-10-CM

## 2023-11-22 ENCOUNTER — Other Ambulatory Visit: Payer: Self-pay

## 2023-11-22 MED ORDER — METFORMIN HCL 500 MG PO TABS
250.0000 mg | ORAL_TABLET | Freq: Every day | ORAL | 1 refills | Status: DC
  Filled 2023-11-22 – 2023-11-24 (×2): qty 45, 90d supply, fill #0
  Filled 2024-03-21: qty 45, 90d supply, fill #1

## 2023-11-22 MED ORDER — ATORVASTATIN CALCIUM 10 MG PO TABS
10.0000 mg | ORAL_TABLET | Freq: Every day | ORAL | 0 refills | Status: DC
Start: 2023-11-22 — End: 2024-01-26
  Filled 2023-11-22 – 2023-11-25 (×3): qty 90, 90d supply, fill #0

## 2023-11-24 ENCOUNTER — Other Ambulatory Visit: Payer: Self-pay

## 2023-11-25 ENCOUNTER — Other Ambulatory Visit: Payer: Self-pay

## 2023-12-30 ENCOUNTER — Other Ambulatory Visit: Payer: Self-pay

## 2024-01-03 ENCOUNTER — Other Ambulatory Visit: Payer: Self-pay

## 2024-01-06 ENCOUNTER — Other Ambulatory Visit: Payer: Self-pay

## 2024-01-12 ENCOUNTER — Ambulatory Visit: Payer: Self-pay | Admitting: Internal Medicine

## 2024-01-23 ENCOUNTER — Other Ambulatory Visit: Payer: Self-pay

## 2024-01-24 ENCOUNTER — Other Ambulatory Visit: Payer: Self-pay

## 2024-01-26 ENCOUNTER — Ambulatory Visit (HOSPITAL_COMMUNITY)
Admission: RE | Admit: 2024-01-26 | Discharge: 2024-01-26 | Disposition: A | Discharge status: Home / Self Care | Payer: Self-pay | Source: Ambulatory Visit | Attending: Internal Medicine | Admitting: Internal Medicine

## 2024-01-26 ENCOUNTER — Encounter: Payer: Self-pay | Admitting: Internal Medicine

## 2024-01-26 ENCOUNTER — Ambulatory Visit: Payer: Self-pay | Attending: Internal Medicine | Admitting: Internal Medicine

## 2024-01-26 ENCOUNTER — Other Ambulatory Visit: Payer: Self-pay

## 2024-01-26 VITALS — BP 115/73 | HR 80 | Ht 73.0 in | Wt 259.0 lb

## 2024-01-26 DIAGNOSIS — G8929 Other chronic pain: Secondary | ICD-10-CM | POA: Diagnosis present

## 2024-01-26 DIAGNOSIS — R0609 Other forms of dyspnea: Secondary | ICD-10-CM

## 2024-01-26 DIAGNOSIS — R7303 Prediabetes: Secondary | ICD-10-CM

## 2024-01-26 DIAGNOSIS — G4733 Obstructive sleep apnea (adult) (pediatric): Secondary | ICD-10-CM

## 2024-01-26 DIAGNOSIS — E66811 Obesity, class 1: Secondary | ICD-10-CM

## 2024-01-26 DIAGNOSIS — M545 Low back pain, unspecified: Secondary | ICD-10-CM

## 2024-01-26 DIAGNOSIS — E782 Mixed hyperlipidemia: Secondary | ICD-10-CM

## 2024-01-26 DIAGNOSIS — Z8546 Personal history of malignant neoplasm of prostate: Secondary | ICD-10-CM

## 2024-01-26 DIAGNOSIS — E669 Obesity, unspecified: Secondary | ICD-10-CM

## 2024-01-26 DIAGNOSIS — Z6834 Body mass index (BMI) 34.0-34.9, adult: Secondary | ICD-10-CM

## 2024-01-26 DIAGNOSIS — Z7984 Long term (current) use of oral hypoglycemic drugs: Secondary | ICD-10-CM

## 2024-01-26 LAB — POCT GLYCOSYLATED HEMOGLOBIN (HGB A1C): HbA1c, POC (prediabetic range): 6.3 % (ref 5.7–6.4)

## 2024-01-26 LAB — GLUCOSE, POCT (MANUAL RESULT ENTRY): POC Glucose: 110 mg/dL — AB (ref 70–99)

## 2024-01-26 MED ORDER — ATORVASTATIN CALCIUM 10 MG PO TABS
10.0000 mg | ORAL_TABLET | Freq: Every day | ORAL | 1 refills | Status: DC
  Filled 2024-01-26 – 2024-03-21 (×2): qty 90, 90d supply, fill #0
  Filled 2024-04-26: qty 90, 90d supply, fill #1

## 2024-01-26 NOTE — Progress Notes (Signed)
 Patient ID: Roy Reed, male    DOB: 1956/07/08  MRN: [id number]  CC: Sleep Apnea (OSA & pre-diabetes f/u. Med refill. Alois Arnt weight loss options /)   Subjective: Roy Reed is a 68 y.o. male who presents for chronic ds management. 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    AMN Language interpreter used during this encounter. #[id number], Melissa  Discussed the use of AI scribe software for clinical note transcription with the patient, who gave verbal consent to proceed.  History of Present Illness Roy Reed is a 68 year old male who presents for chronic ds management  He is in the prediabetes range with an A1c of 6.3 and has lost four pounds since January. He maintains physical activity by walking three times a week and playing tennis twice a week, though recent travel has interrupted his routine.  He takes low-dose metformin  500 mg half a tablet daily for the prediabetes.  He continues to take the atorvastatin  for the cholesterol.  He experiences shortness of breath during tennis and sometimes while walking, requiring breaks every 20 minutes.  No lower extremity edema.  He has left lower back pain during walking, described as a 'pinch', which persists as constant discomfort. The pain does not radiate down the leg. He has a history of sciatica and a disc hernia diagnosed over thirty years ago. X-rays in 2020 showed degenerative changes, and physical therapy was beneficial. He does not take medication for the back pain.  He uses a CPAP machine for sleep apnea, averaging six hours of sleep per night, and reports feeling more refreshed in the morning with fewer headaches since starting therapy. He sometimes wakes up with headaches, which he associates with his medication, metformin  and  atorvastatin . History of prostate CA: He last saw a urologist a year ago, with a PSA level below 0.1, and has a follow-up appointment scheduled for July.    Patient Active Problem List   Diagnosis Date Noted   OSA (obstructive sleep apnea) 12/27/2022   Benign prostatic hyperplasia with urinary obstruction 01/21/2020   Normocytic anemia 11/30/2019   Elevated blood pressure reading 11/30/2019   Dyspnea on exertion 11/30/2019   Prediabetes 11/30/2019   Hyperlipidemia 08/15/2018   Obesity (BMI 30-39.9) 08/15/2018   Skin tag 08/15/2018   Malignant neoplasm of prostate (HCC) 10/13/2017   Screening for malignant neoplasm of colon 08/31/2017   Personal history of gastric ulcer 02/09/2017     Current Outpatient Medications on File Prior to Visit  Medication Sig Dispense Refill   atorvastatin  (LIPITOR) 10 MG tablet Take 1 tablet (10 mg total) by mouth daily. 90 tablet 0   finasteride  (PROSCAR ) 5 MG tablet Take 1 tablet (5 mg total) by mouth daily. 90 tablet 3   metFORMIN  (GLUCOPHAGE ) 500 MG tablet Take 0.5 tablets (250 mg total) by mouth daily with breakfast. 45 tablet 1   silodosin  (RAPAFLO ) 8 MG CAPS capsule Take 1 capsule (8 mg total) by mouth daily with breakfast. 30 capsule 11   tadalafil  (CIALIS ) 20 MG tablet Take 1 tablet (20 mg total) by mouth daily as needed. (Patient not taking: Reported on 01/26/2024) 10 tablet 5   No current facility-administered medications on file prior to visit.    Allergies  Allergen Reactions   Aspirin     States he can take if  he takes acid reducer medicine first. Not taking     Social History   Socioeconomic History   Marital status: Married    Spouse name: Not on file   Number of children: 6   Years of education: Not on file   Highest education level: Not on file  Occupational History   Occupation: retired  Tobacco Use   Smoking status: Never   Smokeless tobacco: Never  Vaping Use   Vaping status: Never Used  Substance and Sexual Activity    Alcohol use: Not Currently   Drug use: No   Sexual activity: Not on file  Other Topics Concern   Not on file  Social History Narrative   Not on file   Social Drivers of Health   Financial Resource Strain: Low Risk  (09/12/2023)   Overall Financial Resource Strain (CARDIA)    Difficulty of Paying Living Expenses: Not hard at all  Food Insecurity: No Food Insecurity (09/12/2023)   Hunger Vital Sign    Worried About Running Out of Food in the Last Year: Never true    Ran Out of Food in the Last Year: Never true  Transportation Needs: No Transportation Needs (09/12/2023)   PRAPARE - Administrator, Civil Service (Medical): No    Lack of Transportation (Non-Medical): No  Physical Activity: Insufficiently Active (09/12/2023)   Exercise Vital Sign    Days of Exercise per Week: 3 days    Minutes of Exercise per Session: 30 min  Stress: No Stress Concern Present (09/12/2023)   [employer] of Occupational Health - Occupational Stress Questionnaire    Feeling of Stress : Not at all  Social Connections: Socially Integrated (09/12/2023)   Social Connection and Isolation Panel [NHANES]    Frequency of Communication with Friends and Family: More than three times a week    Frequency of Social Gatherings with Friends and Family: More than three times a week    Attends Religious Services: 1 to 4 times per year    Active Member of [employer] or Organizations: Yes    Attends Banker Meetings: More than 4 times per year    Marital Status: Married  Catering manager Violence: Not At Risk (09/12/2023)   Humiliation, Afraid, Rape, and Kick questionnaire    Fear of Current or Ex-Partner: No    Emotionally Abused: No    Physically Abused: No    Sexually Abused: No    Family History  Problem Relation Age of Onset   Colon cancer Neg Hx    Colon polyps Neg Hx    Esophageal cancer Neg Hx    Rectal cancer Neg Hx    Stomach cancer Neg Hx     Past Surgical History:  Procedure  Laterality Date   COLONOSCOPY     PROSTATE BIOPSY     PROSTATE BIOPSY     RADIOACTIVE SEED IMPLANT N/A 01/02/2018   Procedure: RADIOACTIVE SEED IMPLANT/BRACHYTHERAPY IMPLANT;  Surgeon: Marco Severs, MD;  Location: [facility] ;  Service: Urology;  Laterality: N/A;   SHOULDER ARTHROSCOPY WITH ROTATOR CUFF REPAIR AND SUBACROMIAL DECOMPRESSION Right 08/05/2017   Procedure: RIGHT SHOULDER ARTHROSCOPY WITH ROTATOR CUFF REPAIR, DISTAL CLAVICLE EXCISION, DEBRIDEMENT AND SUBACROMIAL DECOMPRESSION;  Surgeon: Wes Hamman, MD;  Location: [facility];  Service: Orthopedics;  Laterality: Right;   SPACE OAR INSTILLATION N/A 01/02/2018   Procedure: SPACE OAR INSTILLATION;  Surgeon: Marco Severs, MD;  Location: [facility];  Service: Urology;  Laterality: N/A;    ROS: Review of Systems Negative except as stated above  PHYSICAL EXAM: BP 115/73 (BP Location: Left Arm, Patient Position: Sitting, Cuff Size: Large)   Pulse 80   Ht 6\' 1"  (1.854 m)   Wt 259 lb (117.5 kg)   SpO2 97%   BMI 34.17 kg/m   Wt Readings from Last 3 Encounters:  01/26/24 259 lb (117.5 kg)  09/12/23 263 lb (119.3 kg)  06/14/23 265 lb 9.6 oz (120.5 kg)    Physical Exam  General appearance - alert, well appearing, and in no distress Mental status - normal mood, behavior, speech, dress, motor activity, and thought processes Neck - supple, no significant adenopathy Chest - clear to auscultation, no wheezes, rales or rhonchi, symmetric air entry Heart - normal rate, regular rhythm, normal S1, S2, no murmurs, rubs, clicks or gallops.  No JVD. Extremities - peripheral pulses normal, no pedal edema, no clubbing or cyanosis MSK: No tenderness on palpation of the lumbar spine.  Good power in the lower extremities.    Latest Ref Rng & Units 05/12/2023    2:31 PM 03/05/2022    4:36 PM 04/22/2021    2:58 PM  CMP  Glucose 70 - 99 mg/dL 161  80  77   BUN 8 - 27 mg/dL 14  13  13     Creatinine 0.76 - 1.27 mg/dL 0.96  0.45  4.09   Sodium 134 - 144 mmol/L 141  138  140   Potassium 3.5 - 5.2 mmol/L 4.5  4.3  4.7   Chloride 96 - 106 mmol/L 100  100  100   CO2 20 - 29 mmol/L 26  28  26    Calcium  8.6 - 10.2 mg/dL 9.7  9.9  9.2   Total Protein 6.0 - 8.5 g/dL 7.4  7.6  7.6   Total Bilirubin 0.0 - 1.2 mg/dL 0.2  0.3  0.4   Alkaline Phos 44 - 121 IU/L 84  79  91   AST 0 - 40 IU/L 25  36  22   ALT 0 - 44 IU/L 45  63  36    Lipid Panel     Component Value Date/Time   CHOL 164 11/09/2022 0907   TRIG 65 11/09/2022 0907   HDL 51 11/09/2022 0907   CHOLHDL 3.2 11/09/2022 0907   LDLCALC 100 (H) 11/09/2022 0907   Results for orders placed or performed in visit on 01/26/24  POCT glucose (manual entry)   Collection Time: 01/26/24  9:29 AM  Result Value Ref Range   POC Glucose 110 (A) 70 - 99 mg/dl  POCT glycosylated hemoglobin (Hb A1C)   Collection Time: 01/26/24  9:33 AM  Result Value Ref Range   Hemoglobin A1C     HbA1c POC (<> result, manual entry)     HbA1c, POC (prediabetic range) 6.3 5.7 - 6.4 %   HbA1c, POC (controlled diabetic range)      CBC    Component Value Date/Time   WBC 4.7 05/12/2023 1431   WBC 5.2 12/26/2017 0853   RBC 4.98 05/12/2023 1431   RBC 4.77 12/26/2017 0853   HGB 13.6 05/12/2023 1431   HCT 42.0 05/12/2023 1431   PLT 266 05/12/2023 1431   MCV 84 05/12/2023 1431   MCH 27.3 05/12/2023 1431   MCH 27.0 12/26/2017 0853   MCHC 32.4 05/12/2023 1431   MCHC 32.7 12/26/2017 0853   RDW 12.6 05/12/2023 1431   LYMPHSABS 2.6 02/28/2017 1651   EOSABS  0.0 02/28/2017 1651   BASOSABS 0.0 02/28/2017 1651    ASSESSMENT AND PLAN: 1. Obesity (BMI 30.0-34.9) (Primary) Encouraged to continue trying to eat healthy and try to move as much as he can.  2. Prediabetes See #1 above.  Continue low-dose metformin . - POCT glycosylated hemoglobin (Hb A1C) - POCT glucose (manual entry)  3. Chronic left-sided low back pain without sciatica Likely related to  degenerative disc or arthritis.  Will get an updated x-ray.  Advised to use a heating pad and IcyHot rub or Voltaren  gel which can be purchased over-the-counter.  May refer back to physical therapy once we get the results of the x-ray - DG Lumbar Spine Complete; Future  4. OSA on CPAP Encouraged him to continue using the CPAP since he benefits from it.  5. DOE (dyspnea on exertion) Differential diagnosis include being overweight.  Rule out anemia.  Rule out any underlying CHF.  He had seen cardiology several years ago and had nonobstructive CAD on coronary CT.  Will get him back in for a follow-up. We will check a BNP and CBC.  - Brain natriuretic peptide - CBC - Ambulatory referral to Cardiology  6. Mixed hyperlipidemia - atorvastatin  (LIPITOR) 10 MG tablet; Take 1 tablet (10 mg total) by mouth daily.  Dispense: 90 tablet; Refill: 1  7. History of prostate cancer - PSA    Patient was given the opportunity to ask questions.  Patient verbalized understanding of the plan and was able to repeat key elements of the plan.   This documentation was completed using Paediatric nurse.  Any transcriptional errors are unintentional.  Orders Placed This Encounter  Procedures   POCT glycosylated hemoglobin (Hb A1C)   POCT glucose (manual entry)     Requested Prescriptions   Pending Prescriptions Disp Refills   atorvastatin  (LIPITOR) 10 MG tablet 90 tablet 0    Sig: Take 1 tablet (10 mg total) by mouth daily.    No follow-ups on file.  Concetta Dee, MD, FACP

## 2024-01-26 NOTE — Patient Instructions (Addendum)
 VISIT SUMMARY:  Today, we discussed your weight loss options and evaluated your back pain. We also reviewed your prediabetes status, shortness of breath during physical activities, and your use of a CPAP machine for sleep apnea. Additionally, we talked about your general health maintenance, including your upcoming urology appointment.  YOUR PLAN:  -SHORTNESS OF BREATH: Your shortness of breath occurs after moderate exertion and could be due to several reasons, including anemia, heart issues, or weight-related problems. We will conduct blood tests to check for anemia and heart failure, and you will be referred to a cardiologist for a heart function evaluation.  -DEGENERATIVE DISC DISEASE: Degenerative disc disease means that the discs in your spine are wearing down, which can cause chronic back pain. Your left lower back pain is likely due to this condition. We will continue to monitor your symptoms and consider further treatment options if necessary. We will get a repeat x-ray of your lower back. Purchase and use Voltaren  Gel over the counter.  Avoid heavy lifting, pushing, pulling.  -PREDIABETES: Prediabetes means your blood sugar levels are higher than normal but not high enough to be classified as diabetes. Your A1c level is 6.3, and you have lost 4 pounds since January. Continue taking your low dose of metformin  and maintain healthy eating habits to prevent progression to diabetes.  -OBSTRUCTIVE SLEEP APNEA: Obstructive sleep apnea is a condition where your breathing stops and starts during sleep. You are using a CPAP machine nightly, which has improved your symptoms. Continue using the CPAP machine as prescribed.  -GENERAL HEALTH MAINTENANCE: Your PSA level was normal a year ago, and you have an upcoming urology appointment in July for a routine PSA evaluation. We will also check your PSA level during today's blood test if possible.  INSTRUCTIONS:  Please follow up with the cardiologist for a  heart function evaluation as soon as possible. Continue using your CPAP machine nightly and maintain your physical activities and healthy eating habits. Attend your urology appointment in July for your routine PSA evaluation. We will contact you with the results of your blood tests once they are available.

## 2024-01-27 ENCOUNTER — Ambulatory Visit: Payer: Self-pay | Admitting: Internal Medicine

## 2024-01-27 LAB — CBC
Hematocrit: 44 % (ref 37.5–51.0)
Hemoglobin: 13.8 g/dL (ref 13.0–17.7)
MCH: 26.5 pg — ABNORMAL LOW (ref 26.6–33.0)
MCHC: 31.4 g/dL — ABNORMAL LOW (ref 31.5–35.7)
MCV: 85 fL (ref 79–97)
Platelets: 240 10*3/uL (ref 150–450)
RBC: 5.2 x10E6/uL (ref 4.14–5.80)
RDW: 12.6 % (ref 11.6–15.4)
WBC: 4.4 10*3/uL (ref 3.4–10.8)

## 2024-01-27 LAB — BRAIN NATRIURETIC PEPTIDE: BNP: <2.5 pg/mL (ref 0.0–100.0)

## 2024-01-27 LAB — PSA: Prostate Specific Ag, Serum: <0.1 ng/mL (ref 0.0–4.0)

## 2024-02-06 ENCOUNTER — Other Ambulatory Visit: Payer: Self-pay | Admitting: Nurse Practitioner

## 2024-02-06 DIAGNOSIS — M545 Low back pain, unspecified: Secondary | ICD-10-CM

## 2024-02-20 ENCOUNTER — Other Ambulatory Visit: Payer: Self-pay

## 2024-02-27 ENCOUNTER — Other Ambulatory Visit: Payer: Self-pay

## 2024-02-27 ENCOUNTER — Ambulatory Visit: Attending: Internal Medicine

## 2024-02-27 DIAGNOSIS — M5459 Other low back pain: Secondary | ICD-10-CM | POA: Insufficient documentation

## 2024-02-27 DIAGNOSIS — M6281 Muscle weakness (generalized): Secondary | ICD-10-CM | POA: Insufficient documentation

## 2024-02-27 DIAGNOSIS — R2689 Other abnormalities of gait and mobility: Secondary | ICD-10-CM | POA: Insufficient documentation

## 2024-02-27 NOTE — Therapy (Signed)
 OUTPATIENT PHYSICAL THERAPY THORACOLUMBAR EVALUATION   Patient Name: Roy Reed MRN: [id number] DOB:[birth year], 68 y.o., male Today's Date: 02/27/2024  END OF SESSION:  PT End of Session - 02/27/24 1033     Visit Number 1    Number of Visits 17    Date for PT Re-Evaluation 04/23/24    Authorization Type Medicare    PT Start Time 0802    PT Stop Time 0846    PT Time Calculation (min) 44 min    Activity Tolerance Patient tolerated treatment well    Behavior During Therapy WFL for tasks assessed/performed          Past Medical History:  Diagnosis Date   Diabetes mellitus without complication (HCC)    Elevated PSA    HLD (hyperlipidemia)    side effect from Tamsulosin    Left knee pain    Low back pain    Pre-diabetes    Prostate cancer (HCC)    Rotator cuff disorder, right    Rotator cuff tear arthropathy of right shoulder 08/31/2017   Sleep apnea    Past Surgical History:  Procedure Laterality Date   COLONOSCOPY     PROSTATE BIOPSY     PROSTATE BIOPSY     RADIOACTIVE SEED IMPLANT N/A 01/02/2018   Procedure: RADIOACTIVE SEED IMPLANT/BRACHYTHERAPY IMPLANT;  Surgeon: Sherrilee Belvie CROME, MD;  Location: [facility];  Service: Urology;  Laterality: N/A;   SHOULDER ARTHROSCOPY WITH ROTATOR CUFF REPAIR AND SUBACROMIAL DECOMPRESSION Right 08/05/2017   Procedure: RIGHT SHOULDER ARTHROSCOPY WITH ROTATOR CUFF REPAIR, DISTAL CLAVICLE EXCISION, DEBRIDEMENT AND SUBACROMIAL DECOMPRESSION;  Surgeon: Jerri Kay HERO, MD;  Location: [facility];  Service: Orthopedics;  Laterality: Right;   SPACE OAR INSTILLATION N/A 01/02/2018   Procedure: SPACE OAR INSTILLATION;  Surgeon: Sherrilee Belvie CROME, MD;  Location: [facility];  Service: Urology;  Laterality: N/A;   Patient Active Problem List   Diagnosis Date Noted   OSA (obstructive sleep apnea) 12/27/2022   Benign prostatic hyperplasia with urinary obstruction 01/21/2020   Normocytic anemia  11/30/2019   Elevated blood pressure reading 11/30/2019   Dyspnea on exertion 11/30/2019   Prediabetes 11/30/2019   Hyperlipidemia 08/15/2018   Obesity (BMI 30-39.9) 08/15/2018   Skin tag 08/15/2018   Malignant neoplasm of prostate (HCC) 10/13/2017   Screening for malignant neoplasm of colon 08/31/2017   Personal history of gastric ulcer 02/09/2017    PCP: Vicci Barnie NOVAK, MD  REFERRING PROVIDER: Theotis Haze ORN, NP  REFERRING DIAG: [phone] (ICD-10-CM) - Chronic left-sided low back pain without sciatica   Rationale for Evaluation and Treatment: Rehabilitation  THERAPY DIAG:  Other low back pain  Muscle weakness (generalized)  Other abnormalities of gait and mobility  ONSET DATE: Chronic  SUBJECTIVE:  SUBJECTIVE STATEMENT: Pt presents to PT with reports of chronic L sided LBP with occasional referral into L lateral thigh. Denies b/b changes or saddle anesthesia. Notes pain has been off/on for last 20 years stemming from twisting injury while playing tennis. Has had PT in past and was successful in decreasing pain. Feels very limited in his standing activity tolerance with soccer and yard work which is frustrating.   PERTINENT HISTORY:  DM, hx of cancer  PAIN:  Are you having pain?  Yes: NPRS scale: 7/10 Worst: 9/10 Pain location: L lower back Pain description: sharp, tight Aggravating factors: position on L side, prolonged standing Relieving factors: massage, medication  PRECAUTIONS: None  RED FLAGS: None   WEIGHT BEARING RESTRICTIONS: No  FALLS:  Has patient fallen in last 6 months? Yes. Number of falls - 1 while playing soccer   LIVING ENVIRONMENT: Lives with: lives with their family Lives in: House/apartment  OCCUPATION: Retired  PLOF: Independent  PATIENT  GOALS: be able to do work around the house with decrease pain  OBJECTIVE:  Note: Objective measures were completed at Evaluation unless otherwise noted.  DIAGNOSTIC FINDINGS:  See imaging   PATIENT SURVEYS:  ODI: 18/50  COGNITION: Overall cognitive status: Within functional limits for tasks assessed     SENSATION: WFL  MUSCLE LENGTH: Thomas test: Right (-); Left (+)  POSTURE: rounded shoulders, forward head, and increased lumbar lordosis  PALPATION: TTP   LUMBAR ROM:   AROM eval  Flexion 75%  Extension 25%  Right lateral flexion   Left lateral flexion   Right rotation 25%  Left rotation 50%   (Blank rows = not tested)  LOWER EXTREMITY MMT:    MMT Right eval Left eval  Hip flexion 5 4  Hip extension    Hip abduction 5 4  Hip adduction    Hip internal rotation    Hip external rotation    Knee flexion    Knee extension    Ankle dorsiflexion    Ankle plantarflexion    Ankle inversion    Ankle eversion     (Blank rows = not tested)  LUMBAR SPECIAL TESTS:  Straight leg raise test: Negative and Slump test: Negative  FUNCTIONAL TESTS:  30 Second Sit to Stand: 8 reps  GAIT: Distance walked: 23ft Assistive device utilized: None Level of assistance: Complete Independence Comments: trunk flexed, decreased gait speed  TREATMENT: [facility] Adult PT Treatment:                                                DATE: 02/27/2024 Therapeutic Exercise: Modified thomas stretch x 60 L LTR x 5  Supine PPT x 5 - 5 hold S/L clamshell x 10 RTB  PATIENT EDUCATION:  Education details: eval findings, ODI, HEP, POC Person educated: Patient Education method: Explanation, Demonstration, and Handouts Education comprehension: verbalized understanding and returned demonstration  HOME EXERCISE PROGRAM: Access Code: [email or web address] URL: [email or web address] Date: 02/27/2024 Prepared by: Alm Kingdom  Exercises - Modified Debby Stretch  - 1 x daily - 7 x weekly - 2  reps - 60 seconds hold - Supine Lower Trunk Rotation  - 1 x daily - 7 x weekly - 2 sets - 10 reps - 5 seconds hold - Supine Posterior Pelvic Tilt  - 1 x daily - 7 x weekly - 2 sets - 10 reps - 3-5 seconds  hold - Clamshell with Resistance (Mirrored)  - 1 x daily - 7 x weekly - 2 sets - 10 reps - rouge  hold  ASSESSMENT:  CLINICAL IMPRESSION: Patient is a 68 y.o. M who was seen today for physical therapy evaluation and treatment for chronic L sided LBP. Physical findings are consistent with MD impression as pt demonstrates decrease in lumbar ROM, L hip strength, and core endurance. Also demos decrease in hip flexor muscle length. ODI score shows moderate disability in performance of home ADLs and community activities. Pt would benefit from skilled PT services for work on improving core/hip strength and hip flexor tightness in order to decrease pain and improve comfort.    OBJECTIVE IMPAIRMENTS: Abnormal gait, decreased activity tolerance, decreased mobility, difficulty walking, decreased ROM, decreased strength, and pain   ACTIVITY LIMITATIONS: carrying, lifting, sitting, standing, squatting, stairs, transfers, and locomotion level  PARTICIPATION LIMITATIONS: meal prep, cleaning, laundry, driving, shopping, community activity, occupation, and yard work  PERSONAL FACTORS: Time since onset of injury/illness/exacerbation and 1-2 comorbidities: DM, hx of cancer are also affecting patient's functional outcome.   REHAB POTENTIAL: Excellent  CLINICAL DECISION MAKING: Stable/uncomplicated  EVALUATION COMPLEXITY: Low   GOALS: Goals reviewed with patient? No  SHORT TERM GOALS: Target date: 03/19/2024   Pt will be compliant and knowledgeable with initial HEP for improved comfort and carryover Baseline: initial HEP given  Goal status: INITIAL  2.  Pt will self report low back pain no greater than 5/10 for improved comfort and functional ability Baseline: 9/10 at worst Goal status: INITIAL    LONG TERM GOALS: Target date: 04/23/2024    Pt will be decrease ODI disability score to no greater than 20% (10/50) as proxy for functional improvement Baseline: 36% disability (18/50) Goal status: INITIAL  2.  Pt will self report low back pain no greater than 3/10 for improved comfort and functional ability Baseline: 9/10 at worst Goal status: INITIAL   3.  Pt will improve L hip MMT to no less than 5/5 for improved functional ability and decrease back pain with transfers Baseline: see MMT chart Goal status: INITIAL  4.  Pt will improve standing activity tolerance to be able to perform all home activities such as washing car and yard work with no increase in back pain  Baseline: unable Goal status: INITIAL  5.  Pt will increase 30 Second Sit to Stand rep count to no less than 10 reps for improved balance, strength, and functional mobility Baseline: 8 reps  Goal status: INITIAL   PLAN:  PT FREQUENCY: 2x/week  PT DURATION: 8 weeks  PLANNED INTERVENTIONS: 97164- PT Re-evaluation, 97110-Therapeutic exercises, 97530- Therapeutic activity, [id number]- Neuromuscular re-education, 97535- Self Care, [id number]- Manual therapy, [id number]- Gait training, H9716- Electrical stimulation (unattended), [id number]- Electrical stimulation (manual), 20560 (1-2 muscles), 20561 (3+ muscles)- Dry Needling, Cryotherapy, and Moist heat.  PLAN FOR NEXT SESSION: assess HEP response, core/hip strengthening, hip flexor stretching, manual therapy for L lumbar paraspinals    Alm JAYSON Kingdom, PT 02/27/2024, 10:40 AM

## 2024-02-27 NOTE — Addendum Note (Signed)
 Addended by: JOHNA ALM BROCKS on: 02/27/2024 10:45 AM   Modules accepted: Orders

## 2024-02-28 ENCOUNTER — Ambulatory Visit: Payer: Self-pay

## 2024-03-06 ENCOUNTER — Encounter: Payer: Self-pay | Admitting: Physical Therapy

## 2024-03-06 ENCOUNTER — Ambulatory Visit: Attending: Internal Medicine | Admitting: Physical Therapy

## 2024-03-06 DIAGNOSIS — M6281 Muscle weakness (generalized): Secondary | ICD-10-CM | POA: Insufficient documentation

## 2024-03-06 DIAGNOSIS — R2689 Other abnormalities of gait and mobility: Secondary | ICD-10-CM | POA: Diagnosis present

## 2024-03-06 DIAGNOSIS — M5459 Other low back pain: Secondary | ICD-10-CM | POA: Insufficient documentation

## 2024-03-06 NOTE — Therapy (Signed)
 OUTPATIENT PHYSICAL THERAPY THORACOLUMBAR EVALUATION   Patient Name: Roy Reed MRN: [id number] DOB:22-Jul-1956, 68 y.o., male Today's Date: 03/06/2024  END OF SESSION:  PT End of Session - 03/06/24 1108     Visit Number 2    Number of Visits 17    Date for PT Re-Evaluation 04/23/24    Authorization Type Medicare    PT Start Time 1104    PT Stop Time 1146    PT Time Calculation (min) 42 min    Activity Tolerance Patient tolerated treatment well    Behavior During Therapy WFL for tasks assessed/performed           Past Medical History:  Diagnosis Date   Diabetes mellitus without complication (HCC)    Elevated PSA    HLD (hyperlipidemia)    side effect from Tamsulosin    Left knee pain    Low back pain    Pre-diabetes    Prostate cancer (HCC)    Rotator cuff disorder, right    Rotator cuff tear arthropathy of right shoulder 08/31/2017   Sleep apnea    Past Surgical History:  Procedure Laterality Date   COLONOSCOPY     PROSTATE BIOPSY     PROSTATE BIOPSY     RADIOACTIVE SEED IMPLANT N/A 01/02/2018   Procedure: RADIOACTIVE SEED IMPLANT/BRACHYTHERAPY IMPLANT;  Surgeon: Sherrilee Belvie CROME, MD;  Location: [facility];  Service: Urology;  Laterality: N/A;   SHOULDER ARTHROSCOPY WITH ROTATOR CUFF REPAIR AND SUBACROMIAL DECOMPRESSION Right 08/05/2017   Procedure: RIGHT SHOULDER ARTHROSCOPY WITH ROTATOR CUFF REPAIR, DISTAL CLAVICLE EXCISION, DEBRIDEMENT AND SUBACROMIAL DECOMPRESSION;  Surgeon: Jerri Kay HERO, MD;  Location: [facility];  Service: Orthopedics;  Laterality: Right;   SPACE OAR INSTILLATION N/A 01/02/2018   Procedure: SPACE OAR INSTILLATION;  Surgeon: Sherrilee Belvie CROME, MD;  Location: [facility];  Service: Urology;  Laterality: N/A;   Patient Active Problem List   Diagnosis Date Noted   OSA (obstructive sleep apnea) 12/27/2022   Benign prostatic hyperplasia with urinary obstruction 01/21/2020   Normocytic anemia  11/30/2019   Elevated blood pressure reading 11/30/2019   Dyspnea on exertion 11/30/2019   Prediabetes 11/30/2019   Hyperlipidemia 08/15/2018   Obesity (BMI 30-39.9) 08/15/2018   Skin tag 08/15/2018   Malignant neoplasm of prostate (HCC) 10/13/2017   Screening for malignant neoplasm of colon 08/31/2017   Personal history of gastric ulcer 02/09/2017    PCP: Vicci Barnie NOVAK, MD  REFERRING PROVIDER: Theotis Haze ORN, NP  REFERRING DIAG: [phone] (ICD-10-CM) - Chronic left-sided low back pain without sciatica   Rationale for Evaluation and Treatment: Rehabilitation  THERAPY DIAG:  Other low back pain  Muscle weakness (generalized)  Other abnormalities of gait and mobility  ONSET DATE: Chronic  SUBJECTIVE:  SUBJECTIVE STATEMENT: 03/06/2024  Still the same pain in the l side of the back.  PERTINENT HISTORY:  DM, hx of cancer  PAIN:  Are you having pain?  Yes: NPRS scale: 7/10 Worst: 9/10 Pain location: L lower back Pain description: sharp, tight Aggravating factors: position on L side, prolonged standing Relieving factors: massage, medication  PRECAUTIONS: None  RED FLAGS: None   WEIGHT BEARING RESTRICTIONS: No  FALLS:  Has patient fallen in last 6 months? Yes. Number of falls - 1 while playing soccer   LIVING ENVIRONMENT: Lives with: lives with their family Lives in: House/apartment  OCCUPATION: Retired  PLOF: Independent  PATIENT GOALS: be able to do work around the house with decrease pain  OBJECTIVE:  Note: Objective measures were completed at Evaluation unless otherwise noted.  DIAGNOSTIC FINDINGS:  See imaging   PATIENT SURVEYS:  ODI: 18/50  COGNITION: Overall cognitive status: Within functional limits for tasks assessed     SENSATION: WFL  MUSCLE  LENGTH: Thomas test: Right (-); Left (+)  POSTURE: rounded shoulders, forward head, and increased lumbar lordosis  PALPATION: TTP   leg length discrepancy 03/06/24  True L 97.5 cm R 98.3  Apparent  L 105.5 R 106.1  LUMBAR ROM:   AROM eval  Flexion 75%  Extension 25%  Right lateral flexion   Left lateral flexion   Right rotation 25%  Left rotation 50%   (Blank rows = not tested)  LOWER EXTREMITY MMT:    MMT Right eval Left eval  Hip flexion 5 4  Hip extension    Hip abduction 5 4  Hip adduction    Hip internal rotation    Hip external rotation    Knee flexion    Knee extension    Ankle dorsiflexion    Ankle plantarflexion    Ankle inversion    Ankle eversion     (Blank rows = not tested)  LUMBAR SPECIAL TESTS:  Straight leg raise test: Negative and Slump test: Negative  FUNCTIONAL TESTS:  30 Second Sit to Stand: 8 reps  GAIT: Distance walked: 30ft Assistive device utilized: None Level of assistance: Complete Independence Comments: trunk flexed, decreased gait speed  TREATMENT: [facility] Adult PT Treatment:                                                DATE: 03/06/24 L hamstring stretch PNF contract 3 x 30 sec  MTPR along the L QL x 3 LLE SLR 2 x 12 LAD grade V L QL stretch 2 x 30 sec with overpressure Reviewed anatomy of area involved and LLD assessment.  Updated HEP today   [facility] Adult PT Treatment:                                                DATE: 02/27/2024 Therapeutic Exercise: Modified thomas stretch x 60 L LTR x 5  Supine PPT x 5 - 5 hold S/L clamshell x 10 RTB  PATIENT EDUCATION:  Education details: eval findings, ODI, HEP, POC Person educated: Patient Education method: Explanation, Demonstration, and Handouts Education comprehension: verbalized understanding and returned demonstration  HOME EXERCISE PROGRAM: Access Code: [email or web address] URL: [email or web address] Date: 03/06/2024 Prepared by: Joneen Fresh  Exercises - Modified  Thomas Stretch  - 1 x daily - 7 x weekly - 2 reps - 60 seconds hold - Supine Lower Trunk Rotation  - 1 x daily - 7 x weekly - 2 sets - 10 reps - 5 seconds hold - Supine Posterior Pelvic Tilt  - 1 x daily - 7 x weekly - 2 sets - 10 reps - 3-5 seconds hold - Clamshell with Resistance (Mirrored)  - 1 x daily - 7 x weekly - 2 sets - 10 reps - rouge  hold - SLR  - 1 x daily - 7 x weekly - 2 sets - 10 reps - 1 hold - Seated Hamstring Stretch  - 1 x daily - 7 x weekly - 2 sets - 2 reps - 30 hold - QL stretch (Mirrored)  - 2 x daily - 7 x weekly - 2 sets - 2 reps - 30 seconds hold  ASSESSMENT:  CLINICAL IMPRESSION: 7/1/2025pt presents to physical therapy noting continued pain in the low back predominately on the L. Further assessment revealed potential SIJ involvement and LLD with the LLE being shorter with compensation based on true/ apparent findings. Continued working on hamstring stretch followed with hip flexor activation. Performed LAD of the LLE with cavitation noted and pt noted relief of tension. End of session he noted feeling better with decreased tension.   EVALUATION: Patient is a 68 y.o. M who was seen today for physical therapy evaluation and treatment for chronic L sided LBP. Physical findings are consistent with MD impression as pt demonstrates decrease in lumbar ROM, L hip strength, and core endurance. Also demos decrease in hip flexor muscle length. ODI score shows moderate disability in performance of home ADLs and community activities. Pt would benefit from skilled PT services for work on improving core/hip strength and hip flexor tightness in order to decrease pain and improve comfort.    OBJECTIVE IMPAIRMENTS: Abnormal gait, decreased activity tolerance, decreased mobility, difficulty walking, decreased ROM, decreased strength, and pain   ACTIVITY LIMITATIONS: carrying, lifting, sitting, standing, squatting, stairs, transfers, and locomotion  level  PARTICIPATION LIMITATIONS: meal prep, cleaning, laundry, driving, shopping, community activity, occupation, and yard work  PERSONAL FACTORS: Time since onset of injury/illness/exacerbation and 1-2 comorbidities: DM, hx of cancer are also affecting patient's functional outcome.   REHAB POTENTIAL: Excellent  CLINICAL DECISION MAKING: Stable/uncomplicated  EVALUATION COMPLEXITY: Low   GOALS: Goals reviewed with patient? No  SHORT TERM GOALS: Target date: 03/19/2024   Pt will be compliant and knowledgeable with initial HEP for improved comfort and carryover Baseline: initial HEP given  Goal status: INITIAL  2.  Pt will self report low back pain no greater than 5/10 for improved comfort and functional ability Baseline: 9/10 at worst Goal status: INITIAL   LONG TERM GOALS: Target date: 04/23/2024    Pt will be decrease ODI disability score to no greater than 20% (10/50) as proxy for functional improvement Baseline: 36% disability (18/50) Goal status: INITIAL  2.  Pt will self report low back pain no greater than 3/10 for improved comfort and functional ability Baseline: 9/10 at worst Goal status: INITIAL   3.  Pt will improve L hip MMT to no less than 5/5 for improved functional ability and decrease back pain with transfers Baseline: see MMT chart Goal status: INITIAL  4.  Pt will improve standing activity tolerance to be able to perform all home activities such as washing car and yard work with no increase in back pain  Baseline: unable Goal  status: INITIAL  5.  Pt will increase 30 Second Sit to Stand rep count to no less than 10 reps for improved balance, strength, and functional mobility Baseline: 8 reps  Goal status: INITIAL   PLAN:  PT FREQUENCY: 2x/week  PT DURATION: 8 weeks  PLANNED INTERVENTIONS: 97164- PT Re-evaluation, 97110-Therapeutic exercises, 97530- Therapeutic activity, [id number]- Neuromuscular re-education, 97535- Self Care, [id number]- Manual therapy,  [id number]- Gait training, H9716- Electrical stimulation (unattended), [id number]- Electrical stimulation (manual), 20560 (1-2 muscles), 20561 (3+ muscles)- Dry Needling, Cryotherapy, and Moist heat.  PLAN FOR NEXT SESSION: assess HEP response, core/hip strengthening, hip flexor stretching, manual therapy for L lumbar paraspinals, Response to SIJ treatment, consider potential heel lift if continued L QL pain/ LLD.    Alyxandra Tenbrink PT, DPT, LAT, ATC  03/06/24  12:11 PM

## 2024-03-19 ENCOUNTER — Ambulatory Visit

## 2024-03-19 DIAGNOSIS — M5459 Other low back pain: Secondary | ICD-10-CM | POA: Diagnosis not present

## 2024-03-19 DIAGNOSIS — M6281 Muscle weakness (generalized): Secondary | ICD-10-CM

## 2024-03-19 DIAGNOSIS — R2689 Other abnormalities of gait and mobility: Secondary | ICD-10-CM

## 2024-03-19 NOTE — Therapy (Signed)
 OUTPATIENT PHYSICAL THERAPY TREATMENT   Patient Name: Roy Reed MRN: [id number] DOB:1956-06-23, 68 y.o., male Today's Date: 03/19/2024  END OF SESSION:  PT End of Session - 03/19/24 0929     Visit Number 3    Number of Visits 17    Date for PT Re-Evaluation 04/23/24    Authorization Type Medicare    PT Start Time 0930    PT Stop Time 1010    PT Time Calculation (min) 40 min    Activity Tolerance Patient tolerated treatment well    Behavior During Therapy WFL for tasks assessed/performed            Past Medical History:  Diagnosis Date   Diabetes mellitus without complication (HCC)    Elevated PSA    HLD (hyperlipidemia)    side effect from Tamsulosin    Left knee pain    Low back pain    Pre-diabetes    Prostate cancer (HCC)    Rotator cuff disorder, right    Rotator cuff tear arthropathy of right shoulder 08/31/2017   Sleep apnea    Past Surgical History:  Procedure Laterality Date   COLONOSCOPY     PROSTATE BIOPSY     PROSTATE BIOPSY     RADIOACTIVE SEED IMPLANT N/A 01/02/2018   Procedure: RADIOACTIVE SEED IMPLANT/BRACHYTHERAPY IMPLANT;  Surgeon: Sherrilee Belvie CROME, MD;  Location: [facility];  Service: Urology;  Laterality: N/A;   SHOULDER ARTHROSCOPY WITH ROTATOR CUFF REPAIR AND SUBACROMIAL DECOMPRESSION Right 08/05/2017   Procedure: RIGHT SHOULDER ARTHROSCOPY WITH ROTATOR CUFF REPAIR, DISTAL CLAVICLE EXCISION, DEBRIDEMENT AND SUBACROMIAL DECOMPRESSION;  Surgeon: Jerri Kay HERO, MD;  Location: [facility];  Service: Orthopedics;  Laterality: Right;   SPACE OAR INSTILLATION N/A 01/02/2018   Procedure: SPACE OAR INSTILLATION;  Surgeon: Sherrilee Belvie CROME, MD;  Location: [facility];  Service: Urology;  Laterality: N/A;   Patient Active Problem List   Diagnosis Date Noted   OSA (obstructive sleep apnea) 12/27/2022   Benign prostatic hyperplasia with urinary obstruction 01/21/2020   Normocytic anemia 11/30/2019    Elevated blood pressure reading 11/30/2019   Dyspnea on exertion 11/30/2019   Prediabetes 11/30/2019   Hyperlipidemia 08/15/2018   Obesity (BMI 30-39.9) 08/15/2018   Skin tag 08/15/2018   Malignant neoplasm of prostate (HCC) 10/13/2017   Screening for malignant neoplasm of colon 08/31/2017   Personal history of gastric ulcer 02/09/2017    PCP: Vicci Barnie NOVAK, MD  REFERRING PROVIDER: Theotis Haze ORN, NP  REFERRING DIAG: [phone] (ICD-10-CM) - Chronic left-sided low back pain without sciatica   Rationale for Evaluation and Treatment: Rehabilitation  THERAPY DIAG:  Other low back pain  Muscle weakness (generalized)  Other abnormalities of gait and mobility  ONSET DATE: Chronic  SUBJECTIVE:  SUBJECTIVE STATEMENT: Pt presents to PT with reports decreased L sided LBP at 5/10. Has continued HEP compliant with no adverse effect.   PERTINENT HISTORY:  DM, hx of cancer  PAIN:  Are you having pain?  Yes: NPRS scale: 7/10 Worst: 9/10 Pain location: L lower back Pain description: sharp, tight Aggravating factors: position on L side, prolonged standing Relieving factors: massage, medication  PRECAUTIONS: None  RED FLAGS: None   WEIGHT BEARING RESTRICTIONS: No  FALLS:  Has patient fallen in last 6 months? Yes. Number of falls - 1 while playing soccer   LIVING ENVIRONMENT: Lives with: lives with their family Lives in: House/apartment  OCCUPATION: Retired  PLOF: Independent  PATIENT GOALS: be able to do work around the house with decrease pain  OBJECTIVE:  Note: Objective measures were completed at Evaluation unless otherwise noted.  DIAGNOSTIC FINDINGS:  See imaging   PATIENT SURVEYS:  ODI: 18/50  COGNITION: Overall cognitive status: Within functional limits for  tasks assessed     SENSATION: WFL  MUSCLE LENGTH: Thomas test: Right (-); Left (+)  POSTURE: rounded shoulders, forward head, and increased lumbar lordosis  PALPATION: TTP   leg length discrepancy 03/06/24  True L 97.5 cm R 98.3  Apparent  L 105.5 R 106.1  LUMBAR ROM:   AROM eval  Flexion 75%  Extension 25%  Right lateral flexion   Left lateral flexion   Right rotation 25%  Left rotation 50%   (Blank rows = not tested)  LOWER EXTREMITY MMT:    MMT Right eval Left eval  Hip flexion 5 4  Hip extension    Hip abduction 5 4  Hip adduction    Hip internal rotation    Hip external rotation    Knee flexion    Knee extension    Ankle dorsiflexion    Ankle plantarflexion    Ankle inversion    Ankle eversion     (Blank rows = not tested)  LUMBAR SPECIAL TESTS:  Straight leg raise test: Negative and Slump test: Negative  FUNCTIONAL TESTS:  30 Second Sit to Stand: 8 reps  GAIT: Distance walked: 34ft Assistive device utilized: None Level of assistance: Complete Independence Comments: trunk flexed, decreased gait speed  TREATMENT: [facility] Adult PT Treatment:                                                DATE: 03/19/24 Supine hamstring stretch with strap 2x30 L Bridge 2x10 S/L clamshell x 15 GTB Supine SLR 2x10 S/L hip abd x 10 ea LAD Grade IV L Seated hamstring stretch 2x30 ea  [facility] Adult PT Treatment:                                                DATE: 03/06/24 L hamstring stretch PNF contract 3 x 30 sec  MTPR along the L QL x 3 LLE SLR 2 x 12 LAD grade V L QL stretch 2 x 30 sec with overpressure Reviewed anatomy of area involved and LLD assessment.  Updated HEP today   [facility] Adult PT Treatment:  DATE: 02/27/2024 Therapeutic Exercise: Modified thomas stretch x 60 L LTR x 5  Supine PPT x 5 - 5 hold S/L clamshell x 10 RTB  PATIENT EDUCATION:  Education details: eval findings, ODI, HEP, POC Person  educated: Patient Education method: Explanation, Demonstration, and Handouts Education comprehension: verbalized understanding and returned demonstration  HOME EXERCISE PROGRAM: Access Code: [email or web address] URL: [email or web address] Date: 03/19/2024 Prepared by: Alm Kingdom  Exercises - Modified Thomas Stretch  - 1 x daily - 7 x weekly - 2 reps - 60 seconds hold - Supine Lower Trunk Rotation  - 1 x daily - 7 x weekly - 2 sets - 10 reps - 5 seconds hold - Supine Posterior Pelvic Tilt  - 1 x daily - 7 x weekly - 2 sets - 10 reps - 3-5 seconds hold - Clamshell with Resistance (Mirrored)  - 1 x daily - 7 x weekly - 2 sets - 10 reps - rouge  hold - SLR  - 1 x daily - 7 x weekly - 2 sets - 10 reps - 1 hold - Seated Hamstring Stretch  - 1 x daily - 7 x weekly - 2 sets - 2 reps - 30 hold - QL stretch (Mirrored)  - 2 x daily - 7 x weekly - 2 sets - 2 reps - 30 seconds hold - Supine Bridge  - 1 x daily - 7 x weekly - 2-3 sets - 10 reps  ASSESSMENT:  CLINICAL IMPRESSION: Pt was able to complete all prescribed exercises with no adverse effect. Today we focused on improving core and proximal hip strength in order to decrease back pain and improve comfort. HEP updated for posterior hip strengthening and progression of core activity with bridge. Will continue to progress exercises as able as he continues to demo decreased in mobility and proximal hip strength.   EVALUATION: Patient is a 68 y.o. M who was seen today for physical therapy evaluation and treatment for chronic L sided LBP. Physical findings are consistent with MD impression as pt demonstrates decrease in lumbar ROM, L hip strength, and core endurance. Also demos decrease in hip flexor muscle length. ODI score shows moderate disability in performance of home ADLs and community activities. Pt would benefit from skilled PT services for work on improving core/hip strength and hip flexor tightness in order to decrease pain and improve  comfort.    OBJECTIVE IMPAIRMENTS: Abnormal gait, decreased activity tolerance, decreased mobility, difficulty walking, decreased ROM, decreased strength, and pain   ACTIVITY LIMITATIONS: carrying, lifting, sitting, standing, squatting, stairs, transfers, and locomotion level  PARTICIPATION LIMITATIONS: meal prep, cleaning, laundry, driving, shopping, community activity, occupation, and yard work  PERSONAL FACTORS: Time since onset of injury/illness/exacerbation and 1-2 comorbidities: DM, hx of cancer are also affecting patient's functional outcome.   REHAB POTENTIAL: Excellent  CLINICAL DECISION MAKING: Stable/uncomplicated  EVALUATION COMPLEXITY: Low   GOALS: Goals reviewed with patient? No  SHORT TERM GOALS: Target date: 03/19/2024   Pt will be compliant and knowledgeable with initial HEP for improved comfort and carryover Baseline: initial HEP given  Goal status: INITIAL  2.  Pt will self report low back pain no greater than 5/10 for improved comfort and functional ability Baseline: 9/10 at worst Goal status: INITIAL   LONG TERM GOALS: Target date: 04/23/2024    Pt will be decrease ODI disability score to no greater than 20% (10/50) as proxy for functional improvement Baseline: 36% disability (18/50) Goal status: INITIAL  2.  Pt will self report low back  pain no greater than 3/10 for improved comfort and functional ability Baseline: 9/10 at worst Goal status: INITIAL   3.  Pt will improve L hip MMT to no less than 5/5 for improved functional ability and decrease back pain with transfers Baseline: see MMT chart Goal status: INITIAL  4.  Pt will improve standing activity tolerance to be able to perform all home activities such as washing car and yard work with no increase in back pain  Baseline: unable Goal status: INITIAL  5.  Pt will increase 30 Second Sit to Stand rep count to no less than 10 reps for improved balance, strength, and functional mobility Baseline: 8  reps  Goal status: INITIAL   PLAN:  PT FREQUENCY: 2x/week  PT DURATION: 8 weeks  PLANNED INTERVENTIONS: 97164- PT Re-evaluation, 97110-Therapeutic exercises, 97530- Therapeutic activity, [id number]- Neuromuscular re-education, 97535- Self Care, 02859- Manual therapy, [id number]- Gait training, H9716- Electrical stimulation (unattended), [id number]- Electrical stimulation (manual), 20560 (1-2 muscles), 20561 (3+ muscles)- Dry Needling, Cryotherapy, and Moist heat.  PLAN FOR NEXT SESSION: assess HEP response, core/hip strengthening, hip flexor stretching, manual therapy for L lumbar paraspinals, Response to SIJ treatment, consider potential heel lift if continued L QL pain/ LLD.   Alm JAYSON Kingdom PT  03/19/24 10:42 AM

## 2024-03-21 ENCOUNTER — Encounter: Payer: Self-pay | Admitting: Physical Therapy

## 2024-03-21 ENCOUNTER — Other Ambulatory Visit: Payer: Self-pay

## 2024-03-21 ENCOUNTER — Ambulatory Visit: Admitting: Physical Therapy

## 2024-03-21 DIAGNOSIS — M5459 Other low back pain: Secondary | ICD-10-CM | POA: Diagnosis not present

## 2024-03-21 DIAGNOSIS — M6281 Muscle weakness (generalized): Secondary | ICD-10-CM

## 2024-03-21 DIAGNOSIS — R2689 Other abnormalities of gait and mobility: Secondary | ICD-10-CM

## 2024-03-21 NOTE — Therapy (Signed)
 OUTPATIENT PHYSICAL THERAPY TREATMENT   Patient Name: Roy Reed MRN: [id number] DOB:07-13-1956, 68 y.o., male Today's Date: 03/21/2024  END OF SESSION:  PT End of Session - 03/21/24 0805     Visit Number 4    Number of Visits 17    Date for PT Re-Evaluation 04/23/24    Authorization Type Medicare    PT Start Time 0802    PT Stop Time 0847    PT Time Calculation (min) 45 min            Past Medical History:  Diagnosis Date   Diabetes mellitus without complication (HCC)    Elevated PSA    HLD (hyperlipidemia)    side effect from Tamsulosin    Left knee pain    Low back pain    Pre-diabetes    Prostate cancer (HCC)    Rotator cuff disorder, right    Rotator cuff tear arthropathy of right shoulder 08/31/2017   Sleep apnea    Past Surgical History:  Procedure Laterality Date   COLONOSCOPY     PROSTATE BIOPSY     PROSTATE BIOPSY     RADIOACTIVE SEED IMPLANT N/A 01/02/2018   Procedure: RADIOACTIVE SEED IMPLANT/BRACHYTHERAPY IMPLANT;  Surgeon: Sherrilee Belvie CROME, MD;  Location: [facility] [place];  Service: Urology;  Laterality: N/A;   SHOULDER ARTHROSCOPY WITH ROTATOR CUFF REPAIR AND SUBACROMIAL DECOMPRESSION Right 08/05/2017   Procedure: RIGHT SHOULDER ARTHROSCOPY WITH ROTATOR CUFF REPAIR, DISTAL CLAVICLE EXCISION, DEBRIDEMENT AND SUBACROMIAL DECOMPRESSION;  Surgeon: Jerri Kay HERO, MD;  Location: [facility];  Service: Orthopedics;  Laterality: Right;   SPACE OAR INSTILLATION N/A 01/02/2018   Procedure: SPACE OAR INSTILLATION;  Surgeon: Sherrilee Belvie CROME, MD;  [facility]  Service: Urology;  Laterality: N/A;   Patient Active Problem List   Diagnosis Date Noted   OSA (obstructive sleep apnea) 12/27/2022   Benign prostatic hyperplasia with urinary obstruction 01/21/2020   Normocytic anemia 11/30/2019   Elevated blood pressure reading 11/30/2019   Dyspnea on exertion 11/30/2019   Prediabetes 11/30/2019    Hyperlipidemia 08/15/2018   Obesity (BMI 30-39.9) 08/15/2018   Skin tag 08/15/2018   Malignant neoplasm of prostate (HCC) 10/13/2017   Screening for malignant neoplasm of colon 08/31/2017   Personal history of gastric ulcer 02/09/2017    PCP: Vicci Barnie NOVAK, MD  REFERRING PROVIDER: Theotis Haze ORN, NP  REFERRING DIAG: [phone] (ICD-10-CM) - Chronic left-sided low back pain without sciatica   Rationale for Evaluation and Treatment: Rehabilitation  THERAPY DIAG:  Other low back pain  Muscle weakness (generalized)  Other abnormalities of gait and mobility  ONSET DATE: Chronic  SUBJECTIVE:  SUBJECTIVE STATEMENT: A little bit of back pain. 3-4/10.     PERTINENT HISTORY:  DM, hx of cancer  PAIN:  Are you having pain?  Yes: NPRS scale: 3-4/10 Worst: 9/10 Pain location: L lower back Pain description: sharp, tight Aggravating factors: position on L side, prolonged standing Relieving factors: massage, medication  PRECAUTIONS: None  RED FLAGS: None   WEIGHT BEARING RESTRICTIONS: No  FALLS:  Has patient fallen in last 6 months? Yes. Number of falls - 1 while playing soccer   LIVING ENVIRONMENT: Lives with: lives with their family Lives in: House/apartment  OCCUPATION: Retired  PLOF: Independent  PATIENT GOALS: be able to do work around the house with decrease pain  OBJECTIVE:  Note: Objective measures were completed at Evaluation unless otherwise noted.  DIAGNOSTIC FINDINGS:  See imaging   PATIENT SURVEYS:  ODI: 18/50  COGNITION: Overall cognitive status: Within functional limits for tasks assessed     SENSATION: WFL  MUSCLE LENGTH: Thomas test: Right (-); Left (+)  POSTURE: rounded shoulders, forward head, and increased lumbar lordosis  PALPATION: TTP    leg length discrepancy 03/06/24  True L 97.5 cm R 98.3  Apparent  L 105.5 R 106.1  LUMBAR ROM:   AROM eval  Flexion 75%  Extension 25%  Right lateral flexion   Left lateral flexion   Right rotation 25%  Left rotation 50%   (Blank rows = not tested)  LOWER EXTREMITY MMT:    MMT Right eval Left eval  Hip flexion 5 4  Hip extension    Hip abduction 5 4  Hip adduction    Hip internal rotation    Hip external rotation    Knee flexion    Knee extension    Ankle dorsiflexion    Ankle plantarflexion    Ankle inversion    Ankle eversion     (Blank rows = not tested)  LUMBAR SPECIAL TESTS:  Straight leg raise test: Negative and Slump test: Negative  FUNCTIONAL TESTS:  30 Second Sit to Stand: 8 reps  GAIT: Distance walked: 58ft Assistive device utilized: None Level of assistance: Complete Independence Comments: trunk flexed, decreased gait speed  TREATMENT: [facility] Adult PT Treatment:                                                DATE: 03/21/24 Therapeutic Exercise: Seated lumbar flexion stretch with all, and laterals x 3 each  Seated hamstring stretch 15 sec x 2 each  STS x 10  SLR with ab brace 10 x 2 each -began to have more pain so discontinued lat set Bridge x 10  LTR SKTC Hip flexor stretch60 sec each  S/L clamshell x 15 BTB each      [facility] Adult PT Treatment:                                                DATE: 03/19/24 Supine hamstring stretch with strap 2x30 L Bridge 2x10 S/L clamshell x 15 GTB Supine SLR 2x10 S/L hip abd x 10 ea LAD Grade IV L Seated hamstring stretch 2x30 ea  [facility] Adult PT Treatment:  DATE: 03/06/24 L hamstring stretch PNF contract 3 x 30 sec  MTPR along the L QL x 3 LLE SLR 2 x 12 LAD grade V L QL stretch 2 x 30 sec with overpressure Reviewed anatomy of area involved and LLD assessment.  Updated HEP today   [facility] Adult PT Treatment:                                                 DATE: 02/27/2024 Therapeutic Exercise: Modified thomas stretch x 60 L LTR x 5  Supine PPT x 5 - 5 hold S/L clamshell x 10 RTB  PATIENT EDUCATION:  Education details: eval findings, ODI, HEP, POC Person educated: Patient Education method: Explanation, Demonstration, and Handouts Education comprehension: verbalized understanding and returned demonstration  HOME EXERCISE PROGRAM: Access Code: [email or web address] URL: [email or web address] Date: 03/19/2024 Prepared by: Alm Kingdom  Exercises - Modified Thomas Stretch  - 1 x daily - 7 x weekly - 2 reps - 60 seconds hold - Supine Lower Trunk Rotation  - 1 x daily - 7 x weekly - 2 sets - 10 reps - 5 seconds hold - Supine Posterior Pelvic Tilt  - 1 x daily - 7 x weekly - 2 sets - 10 reps - 3-5 seconds hold - Clamshell with Resistance (Mirrored)  - 1 x daily - 7 x weekly - 2 sets - 10 reps - rouge  hold - SLR  - 1 x daily - 7 x weekly - 2 sets - 10 reps - 1 hold - Seated Hamstring Stretch  - 1 x daily - 7 x weekly - 2 sets - 2 reps - 30 hold - QL stretch (Mirrored)  - 2 x daily - 7 x weekly - 2 sets - 2 reps - 30 seconds hold - Supine Bridge  - 1 x daily - 7 x weekly - 2-3 sets - 10 reps  ASSESSMENT:  CLINICAL IMPRESSION: Pt was able to complete all prescribed exercises with no adverse effect. Today we focused on improving core and proximal hip strength in order to decrease back pain and improve comfort. Added seated lumbar stretches which pt reported a good stretch.  Will continue to progress exercises as able as he continues to demo decreased in mobility and proximal hip strength.   EVALUATION: Patient is a 68 y.o. M who was seen today for physical therapy evaluation and treatment for chronic L sided LBP. Physical findings are consistent with MD impression as pt demonstrates decrease in lumbar ROM, L hip strength, and core endurance. Also demos decrease in hip flexor muscle length. ODI score shows moderate disability in  performance of home ADLs and community activities. Pt would benefit from skilled PT services for work on improving core/hip strength and hip flexor tightness in order to decrease pain and improve comfort.    OBJECTIVE IMPAIRMENTS: Abnormal gait, decreased activity tolerance, decreased mobility, difficulty walking, decreased ROM, decreased strength, and pain   ACTIVITY LIMITATIONS: carrying, lifting, sitting, standing, squatting, stairs, transfers, and locomotion level  PARTICIPATION LIMITATIONS: meal prep, cleaning, laundry, driving, shopping, community activity, occupation, and yard work  PERSONAL FACTORS: Time since onset of injury/illness/exacerbation and 1-2 comorbidities: DM, hx of cancer are also affecting patient's functional outcome.   REHAB POTENTIAL: Excellent  CLINICAL DECISION MAKING: Stable/uncomplicated  EVALUATION COMPLEXITY: Low   GOALS: Goals reviewed with patient? No  SHORT TERM GOALS:  Target date: 03/19/2024   Pt will be compliant and knowledgeable with initial HEP for improved comfort and carryover Baseline: initial HEP given  Goal status: INITIAL  2.  Pt will self report low back pain no greater than 5/10 for improved comfort and functional ability Baseline: 9/10 at worst Goal status: INITIAL   LONG TERM GOALS: Target date: 04/23/2024    Pt will be decrease ODI disability score to no greater than 20% (10/50) as proxy for functional improvement Baseline: 36% disability (18/50) Goal status: INITIAL  2.  Pt will self report low back pain no greater than 3/10 for improved comfort and functional ability Baseline: 9/10 at worst Goal status: INITIAL   3.  Pt will improve L hip MMT to no less than 5/5 for improved functional ability and decrease back pain with transfers Baseline: see MMT chart Goal status: INITIAL  4.  Pt will improve standing activity tolerance to be able to perform all home activities such as washing car and yard work with no increase in back  pain  Baseline: unable Goal status: INITIAL  5.  Pt will increase 30 Second Sit to Stand rep count to no less than 10 reps for improved balance, strength, and functional mobility Baseline: 8 reps  Goal status: INITIAL   PLAN:  PT FREQUENCY: 2x/week  PT DURATION: 8 weeks  PLANNED INTERVENTIONS: 97164- PT Re-evaluation, 97110-Therapeutic exercises, 97530- Therapeutic activity, [id number]- Neuromuscular re-education, 97535- Self Care, [id number]- Manual therapy, [id number]- Gait training, H9716- Electrical stimulation (unattended), [id number]- Electrical stimulation (manual), 20560 (1-2 muscles), 20561 (3+ muscles)- Dry Needling, Cryotherapy, and Moist heat.  PLAN FOR NEXT SESSION: assess HEP response, core/hip strengthening, hip flexor stretching, manual therapy for L lumbar paraspinals, Response to SIJ treatment, consider potential heel lift if continued L QL pain/ LLD.   Harlene Persons, PTA 03/21/24 8:51 AM Phone: [phone] Fax: [phone]

## 2024-03-26 ENCOUNTER — Ambulatory Visit

## 2024-03-27 ENCOUNTER — Other Ambulatory Visit: Payer: Self-pay

## 2024-03-28 ENCOUNTER — Ambulatory Visit: Admitting: Physical Therapy

## 2024-04-02 ENCOUNTER — Encounter: Admitting: Physical Therapy

## 2024-04-02 ENCOUNTER — Ambulatory Visit: Payer: Self-pay | Admitting: Urology

## 2024-04-04 ENCOUNTER — Encounter

## 2024-04-17 ENCOUNTER — Ambulatory Visit: Attending: Internal Medicine

## 2024-04-17 DIAGNOSIS — R2689 Other abnormalities of gait and mobility: Secondary | ICD-10-CM | POA: Insufficient documentation

## 2024-04-17 DIAGNOSIS — M6281 Muscle weakness (generalized): Secondary | ICD-10-CM | POA: Insufficient documentation

## 2024-04-17 DIAGNOSIS — M5459 Other low back pain: Secondary | ICD-10-CM | POA: Insufficient documentation

## 2024-04-17 NOTE — Therapy (Signed)
 OUTPATIENT PHYSICAL THERAPY TREATMENT/RE-CERTIFICATION    Patient Name: Roy Reed MRN: [id number] DOB:04/30/1956, 68 y.o., male Today's Date: 04/17/2024  END OF SESSION:  PT End of Session - 04/17/24 0847     Visit Number 5    Number of Visits 17    Date for PT Re-Evaluation 05/29/24    Authorization Type Medicare    PT Start Time 0848    PT Stop Time 0927    PT Time Calculation (min) 39 min    Activity Tolerance Patient tolerated treatment well    Behavior During Therapy WFL for tasks assessed/performed             Past Medical History:  Diagnosis Date   Diabetes mellitus without complication (HCC)    Elevated PSA    HLD (hyperlipidemia)    side effect from Tamsulosin    Left knee pain    Low back pain    Pre-diabetes    Prostate cancer (HCC)    Rotator cuff disorder, right    Rotator cuff tear arthropathy of right shoulder 08/31/2017   Sleep apnea    Past Surgical History:  Procedure Laterality Date   COLONOSCOPY     PROSTATE BIOPSY     PROSTATE BIOPSY     RADIOACTIVE SEED IMPLANT N/A 01/02/2018   Procedure: RADIOACTIVE SEED IMPLANT/BRACHYTHERAPY IMPLANT;  Surgeon: Sherrilee Belvie CROME, MD;  Location: [facility];  Service: Urology;  Laterality: N/A;   SHOULDER ARTHROSCOPY WITH ROTATOR CUFF REPAIR AND SUBACROMIAL DECOMPRESSION Right 08/05/2017   Procedure: RIGHT SHOULDER ARTHROSCOPY WITH ROTATOR CUFF REPAIR, DISTAL CLAVICLE EXCISION, DEBRIDEMENT AND SUBACROMIAL DECOMPRESSION;  Surgeon: Jerri Kay HERO, MD;  Location: [facility];  Service: Orthopedics;  Laterality: Right;   SPACE OAR INSTILLATION N/A 01/02/2018   Procedure: SPACE OAR INSTILLATION;  Surgeon: Sherrilee Belvie CROME, MD;  Location: [facility];  Service: Urology;  Laterality: N/A;   Patient Active Problem List   Diagnosis Date Noted   OSA (obstructive sleep apnea) 12/27/2022   Benign prostatic hyperplasia with urinary obstruction 01/21/2020   Normocytic  anemia 11/30/2019   Elevated blood pressure reading 11/30/2019   Dyspnea on exertion 11/30/2019   Prediabetes 11/30/2019   Hyperlipidemia 08/15/2018   Obesity (BMI 30-39.9) 08/15/2018   Skin tag 08/15/2018   Malignant neoplasm of prostate (HCC) 10/13/2017   Screening for malignant neoplasm of colon 08/31/2017   Personal history of gastric ulcer 02/09/2017    PCP: Vicci Barnie NOVAK, MD  REFERRING PROVIDER: Theotis Haze ORN, NP  REFERRING DIAG: [phone] (ICD-10-CM) - Chronic left-sided low back pain without sciatica   Rationale for Evaluation and Treatment: Rehabilitation  THERAPY DIAG:  Other low back pain  Muscle weakness (generalized)  Other abnormalities of gait and mobility  ONSET DATE: Chronic  SUBJECTIVE:  SUBJECTIVE STATEMENT: Pt presents to PT with reports of continued low back pain over the last month while traveling in [place]. Rates pain today at 5/10.  PERTINENT HISTORY:  DM, hx of cancer  PAIN:  Are you having pain?  Yes: NPRS scale: 3-4/10 Worst: 9/10 Pain location: L lower back Pain description: sharp, tight Aggravating factors: position on L side, prolonged standing Relieving factors: massage, medication  PRECAUTIONS: None  RED FLAGS: None   WEIGHT BEARING RESTRICTIONS: No  FALLS:  Has patient fallen in last 6 months? Yes. Number of falls - 1 while playing soccer   LIVING ENVIRONMENT: Lives with: lives with their family Lives in: House/apartment  OCCUPATION: Retired  PLOF: Independent  PATIENT GOALS: be able to do work around the house with decrease pain  OBJECTIVE:  Note: Objective measures were completed at Evaluation unless otherwise noted.  DIAGNOSTIC FINDINGS:  See imaging   PATIENT SURVEYS:  ODI: 18/50 04/17/2024: 24% disability  (12/50)  COGNITION: Overall cognitive status: Within functional limits for tasks assessed     SENSATION: WFL  MUSCLE LENGTH: Thomas test: Right (-); Left (+)  POSTURE: rounded shoulders, forward head, and increased lumbar lordosis  PALPATION: TTP   leg length discrepancy 03/06/24  True L 97.5 cm R 98.3  Apparent  L 105.5 R 106.1  LUMBAR ROM:   AROM eval  Flexion 75%  Extension 25%  Right lateral flexion   Left lateral flexion   Right rotation 25%  Left rotation 50%   (Blank rows = not tested)  LOWER EXTREMITY MMT:    MMT Right eval Left eval Left 04/17/24  Hip flexion 5 4 5   Hip extension     Hip abduction 5 4 4   Hip adduction     Hip internal rotation     Hip external rotation     Knee flexion     Knee extension     Ankle dorsiflexion     Ankle plantarflexion     Ankle inversion     Ankle eversion      (Blank rows = not tested)  LUMBAR SPECIAL TESTS:  Straight leg raise test: Negative and Slump test: Negative  FUNCTIONAL TESTS:  30 Second Sit to Stand: 8 reps  GAIT: Distance walked: 67ft Assistive device utilized: None Level of assistance: Complete Independence Comments: trunk flexed, decreased gait speed  TREATMENT: [facility] Adult PT Treatment:                                                DATE: 04/17/24 Repeated flexion in sitting with swiss x 15 Bridge with GTB 2x15 Pilates SLR x 10 ea S/L hip abd x 15 2# ea Modified thomas stretch x 60 ea Assessment of tests/measures, goals, and outcomes for re-certification  [facility] Adult PT Treatment:                                                DATE: 03/21/24 Therapeutic Exercise: Seated lumbar flexion stretch with all, and laterals x 3 each  Seated hamstring stretch 15 sec x 2 each  STS x 10  SLR with ab brace 10 x 2 each -began to have more pain so discontinued lat set Bridge x 10  LTR SKTC Hip  flexor stretch60 sec each  S/L clamshell x 15 BTB each   [facility] Adult PT Treatment:                                                 DATE: 03/19/24 Supine hamstring stretch with strap 2x30 L Bridge 2x10 S/L clamshell x 15 GTB Supine SLR 2x10 S/L hip abd x 10 ea LAD Grade IV L Seated hamstring stretch 2x30 ea  PATIENT EDUCATION:  Education details: HEP and POC extension Person educated: Patient Education method: Explanation, Demonstration, and Handouts Education comprehension: verbalized understanding and returned demonstration  HOME EXERCISE PROGRAM: Access Code: [email or web address] URL: [email or web address] Date: 03/19/2024 Prepared by: Alm Kingdom  Exercises - Modified Thomas Stretch  - 1 x daily - 7 x weekly - 2 reps - 60 seconds hold - Supine Lower Trunk Rotation  - 1 x daily - 7 x weekly - 2 sets - 10 reps - 5 seconds hold - Supine Posterior Pelvic Tilt  - 1 x daily - 7 x weekly - 2 sets - 10 reps - 3-5 seconds hold - Clamshell with Resistance (Mirrored)  - 1 x daily - 7 x weekly - 2 sets - 10 reps - rouge  hold - SLR  - 1 x daily - 7 x weekly - 2 sets - 10 reps - 1 hold - Seated Hamstring Stretch  - 1 x daily - 7 x weekly - 2 sets - 2 reps - 30 hold - QL stretch (Mirrored)  - 2 x daily - 7 x weekly - 2 sets - 2 reps - 30 seconds hold - Supine Bridge  - 1 x daily - 7 x weekly - 2-3 sets - 10 reps  ASSESSMENT:  CLINICAL IMPRESSION: Pt was able to complete prescribed exercises with no adverse effect. Over the course of PT treatment he has made some progress towards decreasing LBP and improving function. Severe of overall pain has decreased and he has met his goals for 30 Second Sit to Stand and being able to perform yard work. His ODI has improved but is still below MDC values. He would continue to benefit from skilled therapeutic interventions as he had to take a month off for travel and is not quite back to PLOF. Pt is in agreement with current plan.   EVALUATION: Patient is a 68 y.o. M who was seen today for physical therapy evaluation and treatment for chronic L sided  LBP. Physical findings are consistent with MD impression as pt demonstrates decrease in lumbar ROM, L hip strength, and core endurance. Also demos decrease in hip flexor muscle length. ODI score shows moderate disability in performance of home ADLs and community activities. Pt would benefit from skilled PT services for work on improving core/hip strength and hip flexor tightness in order to decrease pain and improve comfort.    OBJECTIVE IMPAIRMENTS: Abnormal gait, decreased activity tolerance, decreased mobility, difficulty walking, decreased ROM, decreased strength, and pain   ACTIVITY LIMITATIONS: carrying, lifting, sitting, standing, squatting, stairs, transfers, and locomotion level  PARTICIPATION LIMITATIONS: meal prep, cleaning, laundry, driving, shopping, community activity, occupation, and yard work  PERSONAL FACTORS: Time since onset of injury/illness/exacerbation and 1-2 comorbidities: DM, hx of cancer are also affecting patient's functional outcome.   REHAB POTENTIAL: Excellent  CLINICAL DECISION MAKING: Stable/uncomplicated  EVALUATION COMPLEXITY: Low   GOALS: Goals reviewed with patient?  No  SHORT TERM GOALS: Target date: 03/19/2024   Pt will be compliant and knowledgeable with initial HEP for improved comfort and carryover Baseline: initial HEP given  Goal status: MET  2.  Pt will self report low back pain no greater than 5/10 for improved comfort and functional ability Baseline: 9/10 at worst 04/17/24: 5/10 at worst Goal status: MET   LONG TERM GOALS: Target date: 05/29/2024   Pt will be decrease ODI disability score to no greater than 20% (10/50) as proxy for functional improvement Baseline: 36% disability (18/50) 04/17/2024: 24% disability (12/50) Goal status: IN PROGRESS  2.  Pt will self report low back pain no greater than 3/10 for improved comfort and functional ability Baseline: 9/10 at worst 04/17/24: 5/10 at worst Goal status: IN PROGRESS   3.  Pt will  improve L hip MMT to no less than 5/5 for improved functional ability and decrease back pain with transfers Baseline: see MMT chart Goal status: IN PROGRESS  4.  Pt will improve standing activity tolerance to be able to perform all home activities such as washing car and yard work with no increase in back pain  Baseline: unable 04/17/2024: able to perform activities Goal status: MET  5.  Pt will increase 30 Second Sit to Stand rep count to no less than 10 reps for improved balance, strength, and functional mobility Baseline: 8 reps  04/16/2024: 10 reps Goal status: MET   PLAN:  PT FREQUENCY: 2x/week  PT DURATION: 8 weeks  PLANNED INTERVENTIONS: 97164- PT Re-evaluation, 97110-Therapeutic exercises, 97530- Therapeutic activity, 97112- Neuromuscular re-education, 97535- Self Care, 02859- Manual therapy, [id number]- Gait training, H9716- Electrical stimulation (unattended), [id number]- Electrical stimulation (manual), 20560 (1-2 muscles), 20561 (3+ muscles)- Dry Needling, Cryotherapy, and Moist heat.  PLAN FOR NEXT SESSION: assess HEP response, core/hip strengthening, hip flexor stretching, manual therapy for L lumbar paraspinals, Response to SIJ treatment, consider potential heel lift if continued L QL pain/ LLD.   Alm JAYSON Kingdom PT  04/17/24 10:50 AM

## 2024-04-19 ENCOUNTER — Ambulatory Visit: Admitting: Physical Therapy

## 2024-04-19 ENCOUNTER — Encounter: Payer: Self-pay | Admitting: Physical Therapy

## 2024-04-19 DIAGNOSIS — R2689 Other abnormalities of gait and mobility: Secondary | ICD-10-CM

## 2024-04-19 DIAGNOSIS — M6281 Muscle weakness (generalized): Secondary | ICD-10-CM

## 2024-04-19 DIAGNOSIS — M5459 Other low back pain: Secondary | ICD-10-CM

## 2024-04-19 NOTE — Therapy (Signed)
 OUTPATIENT PHYSICAL THERAPY TREATMENT    Patient Name: Roy Reed MRN: [id number] DOB:18-Dec-1955, 68 y.o., male Today's Date: 04/19/2024  END OF SESSION:  PT End of Session - 04/19/24 0801     Visit Number 6    Number of Visits 17    Date for PT Re-Evaluation 05/29/24    Authorization Type Medicare    PT Start Time 0802    PT Stop Time 0856    PT Time Calculation (min) 54 min             Past Medical History:  Diagnosis Date   Diabetes mellitus without complication (HCC)    Elevated PSA    HLD (hyperlipidemia)    side effect from Tamsulosin    Left knee pain    Low back pain    Pre-diabetes    Prostate cancer (HCC)    Rotator cuff disorder, right    Rotator cuff tear arthropathy of right shoulder 08/31/2017   Sleep apnea    Past Surgical History:  Procedure Laterality Date   COLONOSCOPY     PROSTATE BIOPSY     PROSTATE BIOPSY     RADIOACTIVE SEED IMPLANT N/A 01/02/2018   Procedure: RADIOACTIVE SEED IMPLANT/BRACHYTHERAPY IMPLANT;  Surgeon: Sherrilee Belvie CROME, MD;  Location: [facility] ;  Service: Urology;  Laterality: N/A;   SHOULDER ARTHROSCOPY WITH ROTATOR CUFF REPAIR AND SUBACROMIAL DECOMPRESSION Right 08/05/2017   Procedure: RIGHT SHOULDER ARTHROSCOPY WITH ROTATOR CUFF REPAIR, DISTAL CLAVICLE EXCISION, DEBRIDEMENT AND SUBACROMIAL DECOMPRESSION;  Surgeon: Jerri Kay HERO, MD;  Location: [facility];  Service: Orthopedics;  Laterality: Right;   SPACE OAR INSTILLATION N/A 01/02/2018   Procedure: SPACE OAR INSTILLATION;  Surgeon: Sherrilee Belvie CROME, MD;  Location: [facility];  Service: Urology;  Laterality: N/A;   Patient Active Problem List   Diagnosis Date Noted   OSA (obstructive sleep apnea) 12/27/2022   Benign prostatic hyperplasia with urinary obstruction 01/21/2020   Normocytic anemia 11/30/2019   Elevated blood pressure reading 11/30/2019   Dyspnea on exertion 11/30/2019   Prediabetes 11/30/2019    Hyperlipidemia 08/15/2018   Obesity (BMI 30-39.9) 08/15/2018   Skin tag 08/15/2018   Malignant neoplasm of prostate (HCC) 10/13/2017   Screening for malignant neoplasm of colon 08/31/2017   Personal history of gastric ulcer 02/09/2017    PCP: Vicci Barnie NOVAK, MD  REFERRING PROVIDER: Theotis Haze ORN, NP  REFERRING DIAG: [phone] (ICD-10-CM) - Chronic left-sided low back pain without sciatica   Rationale for Evaluation and Treatment: Rehabilitation  THERAPY DIAG:  Other low back pain  Muscle weakness (generalized)  Other abnormalities of gait and mobility  ONSET DATE: Chronic  SUBJECTIVE:  SUBJECTIVE STATEMENT: Okay now. Worse when traveling due to sitting on long flights.    PERTINENT HISTORY:  DM, hx of cancer  PAIN:  Are you having pain?  Yes: NPRS scale: 4/10 Worst: 9/10 Pain location: L lower back Pain description: sharp, tight Aggravating factors: position on L side, prolonged standing Relieving factors: massage, medication  PRECAUTIONS: None  RED FLAGS: None   WEIGHT BEARING RESTRICTIONS: No  FALLS:  Has patient fallen in last 6 months? Yes. Number of falls - 1 while playing soccer   LIVING ENVIRONMENT: Lives with: lives with their family Lives in: House/apartment  OCCUPATION: Retired  PLOF: Independent  PATIENT GOALS: be able to do work around the house with decrease pain  OBJECTIVE:  Note: Objective measures were completed at Evaluation unless otherwise noted.  DIAGNOSTIC FINDINGS:  See imaging   PATIENT SURVEYS:  ODI: 18/50 04/17/2024: 24% disability (12/50)  COGNITION: Overall cognitive status: Within functional limits for tasks assessed     SENSATION: WFL  MUSCLE LENGTH: Thomas test: Right (-); Left (+)  POSTURE: rounded shoulders,  forward head, and increased lumbar lordosis  PALPATION: TTP   leg length discrepancy 03/06/24  True L 97.5 cm R 98.3  Apparent  L 105.5 R 106.1  LUMBAR ROM:   AROM eval  Flexion 75%  Extension 25%  Right lateral flexion   Left lateral flexion   Right rotation 25%  Left rotation 50%   (Blank rows = not tested)  LOWER EXTREMITY MMT:    MMT Right eval Left eval Left 04/17/24  Hip flexion 5 4 5   Hip extension     Hip abduction 5 4 4   Hip adduction     Hip internal rotation     Hip external rotation     Knee flexion     Knee extension     Ankle dorsiflexion     Ankle plantarflexion     Ankle inversion     Ankle eversion      (Blank rows = not tested)  LUMBAR SPECIAL TESTS:  Straight leg raise test: Negative and Slump test: Negative  FUNCTIONAL TESTS:  30 Second Sit to Stand: 8 reps  GAIT: Distance walked: 85ft Assistive device utilized: None Level of assistance: Complete Independence Comments: trunk flexed, decreased gait speed  TREATMENT: [facility] Adult PT Treatment:                                                DATE: 04/19/24 Therapeutic Exercise: Seated lumbar flexion stretch with exercise ball  Figure 4 QL stretch Bridge x 10 90/90 hold 10 sec x 4   Therapeutic Activity: Standing free motion row 23# x 15  Standing free motions pull down 20# x 15  Palloff press 7# x 10 each way  STS 10# 10 x 2  Modalities: HMP lumbar x 10 minutes    [facility] Adult PT Treatment:                                                DATE: 04/17/24 Repeated flexion in sitting with swiss x 15 Bridge with GTB 2x15 Pilates SLR x 10 ea S/L hip abd x 15 2# ea Modified thomas stretch x 60 ea Assessment of tests/measures, goals,  and outcomes for re-certification  [facility] Adult PT Treatment:                                                DATE: 03/21/24 Therapeutic Exercise: Seated lumbar flexion stretch with all, and laterals x 3 each  Seated hamstring stretch 15 sec x 2 each  STS  x 10  SLR with ab brace 10 x 2 each -began to have more pain so discontinued lat set Bridge x 10  LTR SKTC Hip flexor stretch60 sec each  S/L clamshell x 15 BTB each   [facility] Adult PT Treatment:                                                DATE: 03/19/24 Supine hamstring stretch with strap 2x30 L Bridge 2x10 S/L clamshell x 15 GTB Supine SLR 2x10 S/L hip abd x 10 ea LAD Grade IV L Seated hamstring stretch 2x30 ea  PATIENT EDUCATION:  Education details: HEP and POC extension Person educated: Patient Education method: Explanation, Demonstration, and Handouts Education comprehension: verbalized understanding and returned demonstration  HOME EXERCISE PROGRAM: Access Code: [email or web address] URL: [email or web address] Date: 03/19/2024 Prepared by: Alm Kingdom  Exercises - Modified Thomas Stretch  - 1 x daily - 7 x weekly - 2 reps - 60 seconds hold - Supine Lower Trunk Rotation  - 1 x daily - 7 x weekly - 2 sets - 10 reps - 5 seconds hold - Supine Posterior Pelvic Tilt  - 1 x daily - 7 x weekly - 2 sets - 10 reps - 3-5 seconds hold - Clamshell with Resistance (Mirrored)  - 1 x daily - 7 x weekly - 2 sets - 10 reps - rouge  hold - SLR  - 1 x daily - 7 x weekly - 2 sets - 10 reps - 1 hold - Seated Hamstring Stretch  - 1 x daily - 7 x weekly - 2 sets - 2 reps - 30 hold - QL stretch (Mirrored)  - 2 x daily - 7 x weekly - 2 sets - 2 reps - 30 seconds hold - Supine Bridge  - 1 x daily - 7 x weekly - 2-3 sets - 10 reps  ASSESSMENT:  CLINICAL IMPRESSION: Pt reports he feels pain in his lower back muscles, rates it 4/10. Today progressed to more closed chain lumbar stabilization and additional QL stretches. Pt reported no increase in back pain, only fatigue and muscles working. Trial of HMP for lumbar muscles as he has not tried using modalities to assist in pain  management. He reported a positive response to HMP. He also appreciated cues to count aloud to avoid holding his breath  during his exercises.    Re-eval: Pt was able to complete prescribed exercises with no adverse effect. Over the course of PT treatment he has made some progress towards decreasing LBP and improving function. Severe of overall pain has decreased and he has met his goals for 30 Second Sit to Stand and being able to perform yard work. His ODI has improved but is still below MDC values. He would continue to benefit from skilled therapeutic interventions as he had to take a month off for travel and is not quite  back to PLOF. Pt is in agreement with current plan.   EVALUATION: Patient is a 68 y.o. M who was seen today for physical therapy evaluation and treatment for chronic L sided LBP. Physical findings are consistent with MD impression as pt demonstrates decrease in lumbar ROM, L hip strength, and core endurance. Also demos decrease in hip flexor muscle length. ODI score shows moderate disability in performance of home ADLs and community activities. Pt would benefit from skilled PT services for work on improving core/hip strength and hip flexor tightness in order to decrease pain and improve comfort.    OBJECTIVE IMPAIRMENTS: Abnormal gait, decreased activity tolerance, decreased mobility, difficulty walking, decreased ROM, decreased strength, and pain   ACTIVITY LIMITATIONS: carrying, lifting, sitting, standing, squatting, stairs, transfers, and locomotion level  PARTICIPATION LIMITATIONS: meal prep, cleaning, laundry, driving, shopping, community activity, occupation, and yard work  PERSONAL FACTORS: Time since onset of injury/illness/exacerbation and 1-2 comorbidities: DM, hx of cancer are also affecting patient's functional outcome.   REHAB POTENTIAL: Excellent  CLINICAL DECISION MAKING: Stable/uncomplicated  EVALUATION COMPLEXITY: Low   GOALS: Goals reviewed with patient? No  SHORT TERM GOALS: Target date: 03/19/2024   Pt will be compliant and knowledgeable with initial HEP for improved  comfort and carryover Baseline: initial HEP given  Goal status: MET  2.  Pt will self report low back pain no greater than 5/10 for improved comfort and functional ability Baseline: 9/10 at worst 04/17/24: 5/10 at worst Goal status: MET   LONG TERM GOALS: Target date: 05/29/2024   Pt will be decrease ODI disability score to no greater than 20% (10/50) as proxy for functional improvement Baseline: 36% disability (18/50) 04/17/2024: 24% disability (12/50) Goal status: IN PROGRESS  2.  Pt will self report low back pain no greater than 3/10 for improved comfort and functional ability Baseline: 9/10 at worst 04/17/24: 5/10 at worst Goal status: IN PROGRESS   3.  Pt will improve L hip MMT to no less than 5/5 for improved functional ability and decrease back pain with transfers Baseline: see MMT chart Goal status: IN PROGRESS  4.  Pt will improve standing activity tolerance to be able to perform all home activities such as washing car and yard work with no increase in back pain  Baseline: unable 04/17/2024: able to perform activities Goal status: MET  5.  Pt will increase 30 Second Sit to Stand rep count to no less than 10 reps for improved balance, strength, and functional mobility Baseline: 8 reps  04/16/2024: 10 reps Goal status: MET   PLAN:  PT FREQUENCY: 2x/week  PT DURATION: 8 weeks  PLANNED INTERVENTIONS: 97164- PT Re-evaluation, 97110-Therapeutic exercises, 97530- Therapeutic activity, 97112- Neuromuscular re-education, 97535- Self Care, [id number]- Manual therapy, [id number]- Gait training, H9716- Electrical stimulation (unattended), [id number]- Electrical stimulation (manual), 20560 (1-2 muscles), 20561 (3+ muscles)- Dry Needling, Cryotherapy, and Moist heat.  PLAN FOR NEXT SESSION: assess HEP response, core/hip strengthening, hip flexor stretching, manual therapy for L lumbar paraspinals, Response to SIJ treatment, consider potential heel lift if continued L QL pain/ LLD.   Harlene HERO  Wyatt Thorstenson PTA  04/19/24 9:15 AM

## 2024-04-24 ENCOUNTER — Ambulatory Visit

## 2024-04-24 NOTE — Therapy (Incomplete)
 OUTPATIENT PHYSICAL THERAPY TREATMENT    Patient Name: Roy Reed MRN: [id number] DOB:November 14, 1955, 68 y.o., male Today's Date: 04/24/2024  END OF SESSION:       Past Medical History:  Diagnosis Date   Diabetes mellitus without complication (HCC)    Elevated PSA    HLD (hyperlipidemia)    side effect from Tamsulosin    Left knee pain    Low back pain    Pre-diabetes    Prostate cancer (HCC)    Rotator cuff disorder, right    Rotator cuff tear arthropathy of right shoulder 08/31/2017   Sleep apnea    Past Surgical History:  Procedure Laterality Date   COLONOSCOPY     PROSTATE BIOPSY     PROSTATE BIOPSY     RADIOACTIVE SEED IMPLANT N/A 01/02/2018   Procedure: RADIOACTIVE SEED IMPLANT/BRACHYTHERAPY IMPLANT;  Surgeon: Sherrilee Belvie CROME, MD;  Location: [facility];  Service: Urology;  Laterality: N/A;   SHOULDER ARTHROSCOPY WITH ROTATOR CUFF REPAIR AND SUBACROMIAL DECOMPRESSION Right 08/05/2017   Procedure: RIGHT SHOULDER ARTHROSCOPY WITH ROTATOR CUFF REPAIR, DISTAL CLAVICLE EXCISION, DEBRIDEMENT AND SUBACROMIAL DECOMPRESSION;  Surgeon: Jerri Kay HERO, MD;  Location: [facility];  Service: Orthopedics;  Laterality: Right;   SPACE OAR INSTILLATION N/A 01/02/2018   Procedure: SPACE OAR INSTILLATION;  Surgeon: Sherrilee Belvie CROME, MD;  Location: [facility];  Service: Urology;  Laterality: N/A;   Patient Active Problem List   Diagnosis Date Noted   OSA (obstructive sleep apnea) 12/27/2022   Benign prostatic hyperplasia with urinary obstruction 01/21/2020   Normocytic anemia 11/30/2019   Elevated blood pressure reading 11/30/2019   Dyspnea on exertion 11/30/2019   Prediabetes 11/30/2019   Hyperlipidemia 08/15/2018   Obesity (BMI 30-39.9) 08/15/2018   Skin tag 08/15/2018   Malignant neoplasm of prostate (HCC) 10/13/2017   Screening for malignant neoplasm of colon 08/31/2017   Personal history of gastric ulcer 02/09/2017     PCP: Vicci Barnie NOVAK, MD  REFERRING PROVIDER: Theotis Haze ORN, NP  REFERRING DIAG: [phone] (ICD-10-CM) - Chronic left-sided low back pain without sciatica   Rationale for Evaluation and Treatment: Rehabilitation  THERAPY DIAG:  No diagnosis found.  ONSET DATE: Chronic  SUBJECTIVE:                                                                                                                                                                                           SUBJECTIVE STATEMENT: Okay now. Worse when traveling due to sitting on long flights.    PERTINENT HISTORY:  DM, hx of cancer  PAIN:  Are you having pain?  Yes: NPRS scale:  4/10 Worst: 9/10 Pain location: L lower back Pain description: sharp, tight Aggravating factors: position on L side, prolonged standing Relieving factors: massage, medication  PRECAUTIONS: None  RED FLAGS: None   WEIGHT BEARING RESTRICTIONS: No  FALLS:  Has patient fallen in last 6 months? Yes. Number of falls - 1 while playing soccer   LIVING ENVIRONMENT: Lives with: lives with their family Lives in: House/apartment  OCCUPATION: Retired  PLOF: Independent  PATIENT GOALS: be able to do work around the house with decrease pain  OBJECTIVE:  Note: Objective measures were completed at Evaluation unless otherwise noted.  DIAGNOSTIC FINDINGS:  See imaging   PATIENT SURVEYS:  ODI: 18/50 04/17/2024: 24% disability (12/50)  COGNITION: Overall cognitive status: Within functional limits for tasks assessed     SENSATION: WFL  MUSCLE LENGTH: Thomas test: Right (-); Left (+)  POSTURE: rounded shoulders, forward head, and increased lumbar lordosis  PALPATION: TTP   leg length discrepancy 03/06/24  True L 97.5 cm R 98.3  Apparent  L 105.5 R 106.1  LUMBAR ROM:   AROM eval  Flexion 75%  Extension 25%  Right lateral flexion   Left lateral flexion   Right rotation 25%  Left rotation 50%   (Blank rows =  not tested)  LOWER EXTREMITY MMT:    MMT Right eval Left eval Left 04/17/24  Hip flexion 5 4 5   Hip extension     Hip abduction 5 4 4   Hip adduction     Hip internal rotation     Hip external rotation     Knee flexion     Knee extension     Ankle dorsiflexion     Ankle plantarflexion     Ankle inversion     Ankle eversion      (Blank rows = not tested)  LUMBAR SPECIAL TESTS:  Straight leg raise test: Negative and Slump test: Negative  FUNCTIONAL TESTS:  30 Second Sit to Stand: 8 reps  GAIT: Distance walked: 7ft Assistive device utilized: None Level of assistance: Complete Independence Comments: trunk flexed, decreased gait speed  TREATMENT: [facility] Adult PT Treatment:                                                DATE: 04/24/24 Therapeutic Exercise: Seated lumbar flexion stretch with exercise ball  Figure 4 QL stretch Bridge x 10 90/90 hold 10 sec x 4  Therapeutic Activity: Standing free motion row 23# x 15  Standing free motions pull down 20# x 15  Palloff press 7# x 10 each way  STS 10# 10 x 2  Modalities: HMP lumbar x 10 minutes  [facility] Adult PT Treatment:                                                DATE: 04/19/24 Therapeutic Exercise: Seated lumbar flexion stretch with exercise ball  Figure 4 QL stretch Bridge x 10 90/90 hold 10 sec x 4  Therapeutic Activity: Standing free motion row 23# x 15  Standing free motions pull down 20# x 15  Palloff press 7# x 10 each way  STS 10# 10 x 2  Modalities: HMP lumbar x 10 minutes  [facility] Adult PT Treatment:  DATE: 04/17/24 Repeated flexion in sitting with swiss x 15 Bridge with GTB 2x15 Pilates SLR x 10 ea S/L hip abd x 15 2# ea Modified thomas stretch x 60 ea Assessment of tests/measures, goals, and outcomes for re-certification  [facility] Adult PT Treatment:                                                DATE: 03/21/24 Therapeutic Exercise: Seated lumbar flexion  stretch with all, and laterals x 3 each  Seated hamstring stretch 15 sec x 2 each  STS x 10  SLR with ab brace 10 x 2 each -began to have more pain so discontinued lat set Bridge x 10  LTR SKTC Hip flexor stretch60 sec each  S/L clamshell x 15 BTB each   [facility] Adult PT Treatment:                                                DATE: 03/19/24 Supine hamstring stretch with strap 2x30 L Bridge 2x10 S/L clamshell x 15 GTB Supine SLR 2x10 S/L hip abd x 10 ea LAD Grade IV L Seated hamstring stretch 2x30 ea  PATIENT EDUCATION:  Education details: HEP and POC extension Person educated: Patient Education method: Explanation, Demonstration, and Handouts Education comprehension: verbalized understanding and returned demonstration  HOME EXERCISE PROGRAM: Access Code: [email or web address] URL: [email or web address] Date: 03/19/2024 Prepared by: Alm Kingdom  Exercises - Modified Thomas Stretch  - 1 x daily - 7 x weekly - 2 reps - 60 seconds hold - Supine Lower Trunk Rotation  - 1 x daily - 7 x weekly - 2 sets - 10 reps - 5 seconds hold - Supine Posterior Pelvic Tilt  - 1 x daily - 7 x weekly - 2 sets - 10 reps - 3-5 seconds hold - Clamshell with Resistance (Mirrored)  - 1 x daily - 7 x weekly - 2 sets - 10 reps - rouge  hold - SLR  - 1 x daily - 7 x weekly - 2 sets - 10 reps - 1 hold - Seated Hamstring Stretch  - 1 x daily - 7 x weekly - 2 sets - 2 reps - 30 hold - QL stretch (Mirrored)  - 2 x daily - 7 x weekly - 2 sets - 2 reps - 30 seconds hold - Supine Bridge  - 1 x daily - 7 x weekly - 2-3 sets - 10 reps  ASSESSMENT:  CLINICAL IMPRESSION: ***   Re-eval: Pt was able to complete prescribed exercises with no adverse effect. Over the course of PT treatment he has made some progress towards decreasing LBP and improving function. Severe of overall pain has decreased and he has met his goals for 30 Second Sit to Stand and being able to perform yard work. His ODI has improved but is  still below MDC values. He would continue to benefit from skilled therapeutic interventions as he had to take a month off for travel and is not quite back to PLOF. Pt is in agreement with current plan.   EVALUATION: Patient is a 68 y.o. M who was seen today for physical therapy evaluation and treatment for chronic L sided LBP. Physical findings are consistent with MD impression as  pt demonstrates decrease in lumbar ROM, L hip strength, and core endurance. Also demos decrease in hip flexor muscle length. ODI score shows moderate disability in performance of home ADLs and community activities. Pt would benefit from skilled PT services for work on improving core/hip strength and hip flexor tightness in order to decrease pain and improve comfort.    OBJECTIVE IMPAIRMENTS: Abnormal gait, decreased activity tolerance, decreased mobility, difficulty walking, decreased ROM, decreased strength, and pain   ACTIVITY LIMITATIONS: carrying, lifting, sitting, standing, squatting, stairs, transfers, and locomotion level  PARTICIPATION LIMITATIONS: meal prep, cleaning, laundry, driving, shopping, community activity, occupation, and yard work  PERSONAL FACTORS: Time since onset of injury/illness/exacerbation and 1-2 comorbidities: DM, hx of cancer are also affecting patient's functional outcome.   REHAB POTENTIAL: Excellent  CLINICAL DECISION MAKING: Stable/uncomplicated  EVALUATION COMPLEXITY: Low   GOALS: Goals reviewed with patient? No  SHORT TERM GOALS: Target date: 03/19/2024   Pt will be compliant and knowledgeable with initial HEP for improved comfort and carryover Baseline: initial HEP given  Goal status: MET  2.  Pt will self report low back pain no greater than 5/10 for improved comfort and functional ability Baseline: 9/10 at worst 04/17/24: 5/10 at worst Goal status: MET   LONG TERM GOALS: Target date: 05/29/2024   Pt will be decrease ODI disability score to no greater than 20% (10/50) as  proxy for functional improvement Baseline: 36% disability (18/50) 04/17/2024: 24% disability (12/50) Goal status: IN PROGRESS  2.  Pt will self report low back pain no greater than 3/10 for improved comfort and functional ability Baseline: 9/10 at worst 04/17/24: 5/10 at worst Goal status: IN PROGRESS   3.  Pt will improve L hip MMT to no less than 5/5 for improved functional ability and decrease back pain with transfers Baseline: see MMT chart Goal status: IN PROGRESS  4.  Pt will improve standing activity tolerance to be able to perform all home activities such as washing car and yard work with no increase in back pain  Baseline: unable 04/17/2024: able to perform activities Goal status: MET  5.  Pt will increase 30 Second Sit to Stand rep count to no less than 10 reps for improved balance, strength, and functional mobility Baseline: 8 reps  04/16/2024: 10 reps Goal status: MET   PLAN:  PT FREQUENCY: 2x/week  PT DURATION: 8 weeks  PLANNED INTERVENTIONS: 97164- PT Re-evaluation, 97110-Therapeutic exercises, 97530- Therapeutic activity, 97112- Neuromuscular re-education, 97535- Self Care, 02859- Manual therapy, [id number]- Gait training, H9716- Electrical stimulation (unattended), [id number]- Electrical stimulation (manual), 20560 (1-2 muscles), 20561 (3+ muscles)- Dry Needling, Cryotherapy, and Moist heat.  PLAN FOR NEXT SESSION: assess HEP response, core/hip strengthening, hip flexor stretching, manual therapy for L lumbar paraspinals, Response to SIJ treatment, consider potential heel lift if continued L QL pain/ LLD.   Alm BROCKS Melida Northington PT  04/24/24 7:53 AM

## 2024-04-26 ENCOUNTER — Ambulatory Visit

## 2024-04-26 ENCOUNTER — Other Ambulatory Visit: Payer: Self-pay

## 2024-04-26 ENCOUNTER — Other Ambulatory Visit: Payer: Self-pay | Admitting: Urology

## 2024-04-26 DIAGNOSIS — M6281 Muscle weakness (generalized): Secondary | ICD-10-CM

## 2024-04-26 DIAGNOSIS — M5459 Other low back pain: Secondary | ICD-10-CM | POA: Diagnosis not present

## 2024-04-26 DIAGNOSIS — R2689 Other abnormalities of gait and mobility: Secondary | ICD-10-CM

## 2024-04-26 DIAGNOSIS — N401 Enlarged prostate with lower urinary tract symptoms: Secondary | ICD-10-CM

## 2024-04-26 NOTE — Therapy (Signed)
 OUTPATIENT PHYSICAL THERAPY TREATMENT    Patient Name: Roy Reed MRN: [id number] DOB:[birth year], 68 y.o., male Today's Date: 04/26/2024  END OF SESSION:  PT End of Session - 04/26/24 0844     Visit Number 7    Number of Visits 17    Date for PT Re-Evaluation 05/29/24    Authorization Type Medicare    PT Start Time 0845    PT Stop Time 0925    PT Time Calculation (min) 40 min              Past Medical History:  Diagnosis Date   Diabetes mellitus without complication (HCC)    Elevated PSA    HLD (hyperlipidemia)    side effect from Tamsulosin    Left knee pain    Low back pain    Pre-diabetes    Prostate cancer (HCC)    Rotator cuff disorder, right    Rotator cuff tear arthropathy of right shoulder 08/31/2017   Sleep apnea    Past Surgical History:  Procedure Laterality Date   COLONOSCOPY     PROSTATE BIOPSY     PROSTATE BIOPSY     RADIOACTIVE SEED IMPLANT N/A 01/02/2018   Procedure: RADIOACTIVE SEED IMPLANT/BRACHYTHERAPY IMPLANT;  Surgeon: Sherrilee Belvie CROME, MD;  Location: [facility];  Service: Urology;  Laterality: N/A;   SHOULDER ARTHROSCOPY WITH ROTATOR CUFF REPAIR AND SUBACROMIAL DECOMPRESSION Right 08/05/2017   Procedure: RIGHT SHOULDER ARTHROSCOPY WITH ROTATOR CUFF REPAIR, DISTAL CLAVICLE EXCISION, DEBRIDEMENT AND SUBACROMIAL DECOMPRESSION;  Surgeon: Jerri Kay HERO, MD;  Location: [facility];  Service: Orthopedics;  Laterality: Right;   SPACE OAR INSTILLATION N/A 01/02/2018   Procedure: SPACE OAR INSTILLATION;  Surgeon: Sherrilee Belvie CROME, MD;  Location: [facility];  Service: Urology;  Laterality: N/A;   Patient Active Problem List   Diagnosis Date Noted   OSA (obstructive sleep apnea) 12/27/2022   Benign prostatic hyperplasia with urinary obstruction 01/21/2020   Normocytic anemia 11/30/2019   Elevated blood pressure reading 11/30/2019   Dyspnea on exertion 11/30/2019   Prediabetes 11/30/2019    Hyperlipidemia 08/15/2018   Obesity (BMI 30-39.9) 08/15/2018   Skin tag 08/15/2018   Malignant neoplasm of prostate (HCC) 10/13/2017   Screening for malignant neoplasm of colon 08/31/2017   Personal history of gastric ulcer 02/09/2017    PCP: Vicci Barnie NOVAK, MD  REFERRING PROVIDER: Theotis Haze ORN, NP  REFERRING DIAG: [phone] (ICD-10-CM) - Chronic left-sided low back pain without sciatica   Rationale for Evaluation and Treatment: Rehabilitation  THERAPY DIAG:  Other low back pain  Muscle weakness (generalized)  Other abnormalities of gait and mobility  ONSET DATE: Chronic  SUBJECTIVE:  SUBJECTIVE STATEMENT: Pt presents to PT with some soreness in lower back. Has been compliant with HEP.   PERTINENT HISTORY:  DM, hx of cancer  PAIN:  Are you having pain?  Yes: NPRS scale: 4/10 Worst: 9/10 Pain location: L lower back Pain description: sharp, tight Aggravating factors: position on L side, prolonged standing Relieving factors: massage, medication  PRECAUTIONS: None  RED FLAGS: None   WEIGHT BEARING RESTRICTIONS: No  FALLS:  Has patient fallen in last 6 months? Yes. Number of falls - 1 while playing soccer   LIVING ENVIRONMENT: Lives with: lives with their family Lives in: House/apartment  OCCUPATION: Retired  PLOF: Independent  PATIENT GOALS: be able to do work around the house with decrease pain  OBJECTIVE:  Note: Objective measures were completed at Evaluation unless otherwise noted.  DIAGNOSTIC FINDINGS:  See imaging   PATIENT SURVEYS:  ODI: 18/50 04/17/2024: 24% disability (12/50)  COGNITION: Overall cognitive status: Within functional limits for tasks assessed     SENSATION: WFL  MUSCLE LENGTH: Thomas test: Right (-); Left (+)  POSTURE: rounded  shoulders, forward head, and increased lumbar lordosis  PALPATION: TTP   leg length discrepancy 03/06/24  True L 97.5 cm R 98.3  Apparent  L 105.5 R 106.1  LUMBAR ROM:   AROM eval  Flexion 75%  Extension 25%  Right lateral flexion   Left lateral flexion   Right rotation 25%  Left rotation 50%   (Blank rows = not tested)  LOWER EXTREMITY MMT:    MMT Right eval Left eval Left 04/17/24  Hip flexion 5 4 5   Hip extension     Hip abduction 5 4 4   Hip adduction     Hip internal rotation     Hip external rotation     Knee flexion     Knee extension     Ankle dorsiflexion     Ankle plantarflexion     Ankle inversion     Ankle eversion      (Blank rows = not tested)  LUMBAR SPECIAL TESTS:  Straight leg raise test: Negative and Slump test: Negative  FUNCTIONAL TESTS:  30 Second Sit to Stand: 8 reps  GAIT: Distance walked: 72ft Assistive device utilized: None Level of assistance: Complete Independence Comments: trunk flexed, decreased gait speed  TREATMENT: [facility] Adult PT Treatment:                                                DATE: 04/26/24 Therapeutic Exercise: Bridge x 15  Pilates SLR 2x10 ea 90/90 holf 2x20 S/L hip abd x 15 ea Birddog x 10 Seated lumbar flexion stretch swiss x 10 - 5 hold Therapeutic Activity: Standing FM chop x 10 10# ea Standin FM row 2x12 23# Palloff press 10# x 10 each way   [facility] Adult PT Treatment:                                                DATE: 04/19/24 Therapeutic Exercise: Seated lumbar flexion stretch with exercise ball  Figure 4 QL stretch Bridge x 10 90/90 hold 10 sec x 4  Therapeutic Activity: Standing free motion row 23# x 15  Standing free motions pull down 20# x 15  Palloff press 7# x 10 each way  STS 10# 10 x 2  Modalities: HMP lumbar x 10 minutes  [facility] Adult PT Treatment:                                                DATE: 04/17/24 Repeated flexion in sitting with swiss x 15 Bridge with GTB  2x15 Pilates SLR x 10 ea S/L hip abd x 15 2# ea Modified thomas stretch x 60 ea Assessment of tests/measures, goals, and outcomes for re-certification  [facility] Adult PT Treatment:                                                DATE: 03/21/24 Therapeutic Exercise: Seated lumbar flexion stretch with all, and laterals x 3 each  Seated hamstring stretch 15 sec x 2 each  STS x 10  SLR with ab brace 10 x 2 each -began to have more pain so discontinued lat set Bridge x 10  LTR SKTC Hip flexor stretch60 sec each  S/L clamshell x 15 BTB each   [facility] Adult PT Treatment:                                                DATE: 03/19/24 Supine hamstring stretch with strap 2x30 L Bridge 2x10 S/L clamshell x 15 GTB Supine SLR 2x10 S/L hip abd x 10 ea LAD Grade IV L Seated hamstring stretch 2x30 ea  PATIENT EDUCATION:  Education details: HEP and POC extension Person educated: Patient Education method: Explanation, Demonstration, and Handouts Education comprehension: verbalized understanding and returned demonstration  HOME EXERCISE PROGRAM: Access Code: [email or web address] URL: [email or web address] Date: 03/19/2024 Prepared by: Alm Kingdom  Exercises - Modified Thomas Stretch  - 1 x daily - 7 x weekly - 2 reps - 60 seconds hold - Supine Lower Trunk Rotation  - 1 x daily - 7 x weekly - 2 sets - 10 reps - 5 seconds hold - Supine Posterior Pelvic Tilt  - 1 x daily - 7 x weekly - 2 sets - 10 reps - 3-5 seconds hold - Clamshell with Resistance (Mirrored)  - 1 x daily - 7 x weekly - 2 sets - 10 reps - rouge  hold - SLR  - 1 x daily - 7 x weekly - 2 sets - 10 reps - 1 hold - Seated Hamstring Stretch  - 1 x daily - 7 x weekly - 2 sets - 2 reps - 30 hold - QL stretch (Mirrored)  - 2 x daily - 7 x weekly - 2 sets - 2 reps - 30 seconds hold - Supine Bridge  - 1 x daily - 7 x weekly - 2-3 sets - 10 reps  ASSESSMENT:  CLINICAL IMPRESSION: Pt was able to complete all prescribed exercises with no  adverse effect. Today we continued to progress core and overall strength in order to decrease pain with activity. Pt is progressing well with therapy, continues to benefit from skilled services with progression focused on core.   Re-eval: Pt was able to complete prescribed exercises with no adverse effect. Over  the course of PT treatment he has made some progress towards decreasing LBP and improving function. Severe of overall pain has decreased and he has met his goals for 30 Second Sit to Stand and being able to perform yard work. His ODI has improved but is still below MDC values. He would continue to benefit from skilled therapeutic interventions as he had to take a month off for travel and is not quite back to PLOF. Pt is in agreement with current plan.   EVALUATION: Patient is a 68 y.o. M who was seen today for physical therapy evaluation and treatment for chronic L sided LBP. Physical findings are consistent with MD impression as pt demonstrates decrease in lumbar ROM, L hip strength, and core endurance. Also demos decrease in hip flexor muscle length. ODI score shows moderate disability in performance of home ADLs and community activities. Pt would benefit from skilled PT services for work on improving core/hip strength and hip flexor tightness in order to decrease pain and improve comfort.    OBJECTIVE IMPAIRMENTS: Abnormal gait, decreased activity tolerance, decreased mobility, difficulty walking, decreased ROM, decreased strength, and pain   ACTIVITY LIMITATIONS: carrying, lifting, sitting, standing, squatting, stairs, transfers, and locomotion level  PARTICIPATION LIMITATIONS: meal prep, cleaning, laundry, driving, shopping, community activity, occupation, and yard work  PERSONAL FACTORS: Time since onset of injury/illness/exacerbation and 1-2 comorbidities: DM, hx of cancer are also affecting patient's functional outcome.   REHAB POTENTIAL: Excellent  CLINICAL DECISION MAKING:  Stable/uncomplicated  EVALUATION COMPLEXITY: Low   GOALS: Goals reviewed with patient? No  SHORT TERM GOALS: Target date: 03/19/2024   Pt will be compliant and knowledgeable with initial HEP for improved comfort and carryover Baseline: initial HEP given  Goal status: MET  2.  Pt will self report low back pain no greater than 5/10 for improved comfort and functional ability Baseline: 9/10 at worst 04/17/24: 5/10 at worst Goal status: MET   LONG TERM GOALS: Target date: 05/29/2024   Pt will be decrease ODI disability score to no greater than 20% (10/50) as proxy for functional improvement Baseline: 36% disability (18/50) 04/17/2024: 24% disability (12/50) Goal status: IN PROGRESS  2.  Pt will self report low back pain no greater than 3/10 for improved comfort and functional ability Baseline: 9/10 at worst 04/17/24: 5/10 at worst Goal status: IN PROGRESS   3.  Pt will improve L hip MMT to no less than 5/5 for improved functional ability and decrease back pain with transfers Baseline: see MMT chart Goal status: IN PROGRESS  4.  Pt will improve standing activity tolerance to be able to perform all home activities such as washing car and yard work with no increase in back pain  Baseline: unable 04/17/2024: able to perform activities Goal status: MET  5.  Pt will increase 30 Second Sit to Stand rep count to no less than 10 reps for improved balance, strength, and functional mobility Baseline: 8 reps  04/16/2024: 10 reps Goal status: MET   PLAN:  PT FREQUENCY: 2x/week  PT DURATION: 8 weeks  PLANNED INTERVENTIONS: 97164- PT Re-evaluation, 97110-Therapeutic exercises, 97530- Therapeutic activity, 97112- Neuromuscular re-education, 97535- Self Care, 02859- Manual therapy, [id number]- Gait training, H9716- Electrical stimulation (unattended), [id number]- Electrical stimulation (manual), 20560 (1-2 muscles), 20561 (3+ muscles)- Dry Needling, Cryotherapy, and Moist heat.  PLAN FOR NEXT SESSION:  assess HEP response, core/hip strengthening, hip flexor stretching, manual therapy for L lumbar paraspinals, Response to SIJ treatment, consider potential heel lift if continued L QL pain/ LLD.  Alm JAYSON Kingdom PT  04/26/24 10:09 AM

## 2024-04-27 ENCOUNTER — Other Ambulatory Visit: Payer: Self-pay

## 2024-04-27 MED ORDER — FINASTERIDE 5 MG PO TABS
5.0000 mg | ORAL_TABLET | Freq: Every day | ORAL | 3 refills | Status: DC
  Filled 2024-04-27: qty 90, 90d supply, fill #0

## 2024-04-27 MED ORDER — SILODOSIN 8 MG PO CAPS
8.0000 mg | ORAL_CAPSULE | Freq: Every day | ORAL | 11 refills | Status: DC
  Filled 2024-04-27: qty 30, 30d supply, fill #0
  Filled 2024-05-03: qty 30, 30d supply, fill #1

## 2024-05-01 ENCOUNTER — Encounter: Payer: Self-pay | Admitting: Physical Therapy

## 2024-05-01 ENCOUNTER — Ambulatory Visit: Admitting: Physical Therapy

## 2024-05-01 DIAGNOSIS — M5459 Other low back pain: Secondary | ICD-10-CM

## 2024-05-01 DIAGNOSIS — M6281 Muscle weakness (generalized): Secondary | ICD-10-CM

## 2024-05-01 DIAGNOSIS — R2689 Other abnormalities of gait and mobility: Secondary | ICD-10-CM

## 2024-05-01 NOTE — Therapy (Signed)
 OUTPATIENT PHYSICAL THERAPY TREATMENT    Patient Name: Roy Reed MRN: [id number] DOB:1956-05-09, 68 y.o., male Today's Date: 05/01/2024  END OF SESSION:  PT End of Session - 05/01/24 0803     Visit Number 8    Number of Visits 17    Date for PT Re-Evaluation 05/29/24    Authorization Type Medicare    PT Start Time 0802    PT Stop Time 0842    PT Time Calculation (min) 40 min              Past Medical History:  Diagnosis Date   Diabetes mellitus without complication (HCC)    Elevated PSA    HLD (hyperlipidemia)    side effect from Tamsulosin    Left knee pain    Low back pain    Pre-diabetes    Prostate cancer (HCC)    Rotator cuff disorder, right    Rotator cuff tear arthropathy of right shoulder 08/31/2017   Sleep apnea    Past Surgical History:  Procedure Laterality Date   COLONOSCOPY     PROSTATE BIOPSY     PROSTATE BIOPSY     RADIOACTIVE SEED IMPLANT N/A 01/02/2018   Procedure: RADIOACTIVE SEED IMPLANT/BRACHYTHERAPY IMPLANT;  Surgeon: Sherrilee Belvie CROME, MD;  Location: [facility] ;  Service: Urology;  Laterality: N/A;   SHOULDER ARTHROSCOPY WITH ROTATOR CUFF REPAIR AND SUBACROMIAL DECOMPRESSION Right 08/05/2017   Procedure: RIGHT SHOULDER ARTHROSCOPY WITH ROTATOR CUFF REPAIR, DISTAL CLAVICLE EXCISION, DEBRIDEMENT AND SUBACROMIAL DECOMPRESSION;  Surgeon: Jerri Kay HERO, MD;  Location: [facility];  Service: Orthopedics;  Laterality: Right;   SPACE OAR INSTILLATION N/A 01/02/2018   Procedure: SPACE OAR INSTILLATION;  Surgeon: Sherrilee Belvie CROME, MD;  Location: [facility];  Service: Urology;  Laterality: N/A;   Patient Active Problem List   Diagnosis Date Noted   OSA (obstructive sleep apnea) 12/27/2022   Benign prostatic hyperplasia with urinary obstruction 01/21/2020   Normocytic anemia 11/30/2019   Elevated blood pressure reading 11/30/2019   Dyspnea on exertion 11/30/2019   Prediabetes 11/30/2019    Hyperlipidemia 08/15/2018   Obesity (BMI 30-39.9) 08/15/2018   Skin tag 08/15/2018   Malignant neoplasm of prostate (HCC) 10/13/2017   Screening for malignant neoplasm of colon 08/31/2017   Personal history of gastric ulcer 02/09/2017    PCP: Vicci Barnie NOVAK, MD  REFERRING PROVIDER: Theotis Haze ORN, NP  REFERRING DIAG: [phone] (ICD-10-CM) - Chronic left-sided low back pain without sciatica   Rationale for Evaluation and Treatment: Rehabilitation  THERAPY DIAG:  Other low back pain  Muscle weakness (generalized)  Other abnormalities of gait and mobility  ONSET DATE: Chronic  SUBJECTIVE:  SUBJECTIVE STATEMENT: I was able to play tennis over the weekend without increased pain. I do get out of breath easy with tennis. I am noticing I am not having pain as much.    PERTINENT HISTORY:  DM, hx of cancer  PAIN:  Are you having pain?  Yes: NPRS scale: 0/10 Worst: 9/10 Pain location: L lower back Pain description: sharp, tight Aggravating factors: position on L side, prolonged standing Relieving factors: massage, medication  PRECAUTIONS: None  RED FLAGS: None   WEIGHT BEARING RESTRICTIONS: No  FALLS:  Has patient fallen in last 6 months? Yes. Number of falls - 1 while playing soccer   LIVING ENVIRONMENT: Lives with: lives with their family Lives in: House/apartment  OCCUPATION: Retired  PLOF: Independent  PATIENT GOALS: be able to do work around the house with decrease pain  OBJECTIVE:  Note: Objective measures were completed at Evaluation unless otherwise noted.  DIAGNOSTIC FINDINGS:  See imaging   PATIENT SURVEYS:  ODI: 18/50 04/17/2024: 24% disability (12/50)  COGNITION: Overall cognitive status: Within functional limits for tasks  assessed     SENSATION: WFL  MUSCLE LENGTH: Thomas test: Right (-); Left (+)  POSTURE: rounded shoulders, forward head, and increased lumbar lordosis  PALPATION: TTP   leg length discrepancy 03/06/24  True L 97.5 cm R 98.3  Apparent  L 105.5 R 106.1  LUMBAR ROM:   AROM eval  Flexion 75%  Extension 25%  Right lateral flexion   Left lateral flexion   Right rotation 25%  Left rotation 50%   (Blank rows = not tested)  LOWER EXTREMITY MMT:    MMT Right eval Left eval Left 04/17/24  Hip flexion 5 4 5   Hip extension     Hip abduction 5 4 4   Hip adduction     Hip internal rotation     Hip external rotation     Knee flexion     Knee extension     Ankle dorsiflexion     Ankle plantarflexion     Ankle inversion     Ankle eversion      (Blank rows = not tested)  LUMBAR SPECIAL TESTS:  Straight leg raise test: Negative and Slump test: Negative  FUNCTIONAL TESTS:  30 Second Sit to Stand: 8 reps 05/01/24: 12 reps   GAIT: Distance walked: 38ft Assistive device utilized: None Level of assistance: Complete Independence Comments: trunk flexed, decreased gait speed  TREATMENT: [facility] Adult PT Treatment:                                                DATE: 05/01/24 Therapeutic Exercise: Banded bridge 15 x 2  blue  Pilates SLR 2x10 ea 90/90 hold 2x20 S/L hip abd x 15 ea, 2#  Seated ball lumbar flexion roll outs x 10.   Therapeutic Activity: 30 sec stand test: 12 reps  Standing FM down ward chop x 10 13# ea Standin FM row 2x12 23# Palloff press 13# x 12 each way      [facility] Adult PT Treatment:                                                DATE: 04/26/24 Therapeutic Exercise: Bridge x 15  Pilates  SLR 2x10 ea 90/90 hold 2x20 S/L hip abd x 15 ea Birddog x 10 Seated lumbar flexion stretch swiss x 10 - 5 hold Therapeutic Activity: Standing FM chop x 10 10# ea Standin FM row 2x12 23# Palloff press 10# x 10 each way   [facility] Adult PT Treatment:                                                 DATE: 04/19/24 Therapeutic Exercise: Seated lumbar flexion stretch with exercise ball  Figure 4 QL stretch Bridge x 10 90/90 hold 10 sec x 4  Therapeutic Activity: Standing free motion row 23# x 15  Standing free motions pull down 20# x 15  Palloff press 7# x 10 each way  STS 10# 10 x 2  Modalities: HMP lumbar x 10 minutes  [facility] Adult PT Treatment:                                                DATE: 04/17/24 Repeated flexion in sitting with swiss x 15 Bridge with GTB 2x15 Pilates SLR x 10 ea S/L hip abd x 15 2# ea Modified thomas stretch x 60 ea Assessment of tests/measures, goals, and outcomes for re-certification  [facility] Adult PT Treatment:                                                DATE: 03/21/24 Therapeutic Exercise: Seated lumbar flexion stretch with all, and laterals x 3 each  Seated hamstring stretch 15 sec x 2 each  STS x 10  SLR with ab brace 10 x 2 each -began to have more pain so discontinued lat set Bridge x 10  LTR SKTC Hip flexor stretch60 sec each  S/L clamshell x 15 BTB each   [facility] Adult PT Treatment:                                                DATE: 03/19/24 Supine hamstring stretch with strap 2x30 L Bridge 2x10 S/L clamshell x 15 GTB Supine SLR 2x10 S/L hip abd x 10 ea LAD Grade IV L Seated hamstring stretch 2x30 ea  PATIENT EDUCATION:  Education details: HEP and POC extension Person educated: Patient Education method: Explanation, Demonstration, and Handouts Education comprehension: verbalized understanding and returned demonstration  HOME EXERCISE PROGRAM: Access Code: [email or web address] URL: [email or web address] Date: 03/19/2024 Prepared by: Alm Kingdom  Exercises - Modified Debby Stretch  - 1 x daily - 7 x weekly - 2 reps - 60 seconds hold - Supine Lower Trunk Rotation  - 1 x daily - 7 x weekly - 2 sets - 10 reps - 5 seconds hold - Supine Posterior Pelvic Tilt  - 1 x daily - 7 x weekly - 2  sets - 10 reps - 3-5 seconds hold - Clamshell with Resistance (Mirrored)  - 1 x daily - 7 x weekly - 2 sets - 10 reps - rouge  hold - SLR  -  1 x daily - 7 x weekly - 2 sets - 10 reps - 1 hold - Seated Hamstring Stretch  - 1 x daily - 7 x weekly - 2 sets - 2 reps - 30 hold - QL stretch (Mirrored)  - 2 x daily - 7 x weekly - 2 sets - 2 reps - 30 seconds hold - Supine Bridge  - 1 x daily - 7 x weekly - 2-3 sets - 10 reps  ASSESSMENT:  CLINICAL IMPRESSION: Pt was able to complete all prescribed exercises with no adverse effect. He reports continued improvement and sometimes does not have back pain. 0/10 pain on arrival today. Was able to play tennis over the weekend without back pain, only general fatigue. Today we continued to progress core and overall strength in order to decrease pain with activity. Pt is progressing well with therapy, continues to benefit from skilled services with progression focused on core. He is making good progress toward remaining LTGs.   Re-eval: Pt was able to complete prescribed exercises with no adverse effect. Over the course of PT treatment he has made some progress towards decreasing LBP and improving function. Severe of overall pain has decreased and he has met his goals for 30 Second Sit to Stand and being able to perform yard work. His ODI has improved but is still below MDC values. He would continue to benefit from skilled therapeutic interventions as he had to take a month off for travel and is not quite back to PLOF. Pt is in agreement with current plan.   EVALUATION: Patient is a 68 y.o. M who was seen today for physical therapy evaluation and treatment for chronic L sided LBP. Physical findings are consistent with MD impression as pt demonstrates decrease in lumbar ROM, L hip strength, and core endurance. Also demos decrease in hip flexor muscle length. ODI score shows moderate disability in performance of home ADLs and community activities. Pt would benefit from  skilled PT services for work on improving core/hip strength and hip flexor tightness in order to decrease pain and improve comfort.    OBJECTIVE IMPAIRMENTS: Abnormal gait, decreased activity tolerance, decreased mobility, difficulty walking, decreased ROM, decreased strength, and pain   ACTIVITY LIMITATIONS: carrying, lifting, sitting, standing, squatting, stairs, transfers, and locomotion level  PARTICIPATION LIMITATIONS: meal prep, cleaning, laundry, driving, shopping, community activity, occupation, and yard work  PERSONAL FACTORS: Time since onset of injury/illness/exacerbation and 1-2 comorbidities: DM, hx of cancer are also affecting patient's functional outcome.   REHAB POTENTIAL: Excellent  CLINICAL DECISION MAKING: Stable/uncomplicated  EVALUATION COMPLEXITY: Low   GOALS: Goals reviewed with patient? No  SHORT TERM GOALS: Target date: 03/19/2024   Pt will be compliant and knowledgeable with initial HEP for improved comfort and carryover Baseline: initial HEP given  Goal status: MET  2.  Pt will self report low back pain no greater than 5/10 for improved comfort and functional ability Baseline: 9/10 at worst 04/17/24: 5/10 at worst Goal status: MET   LONG TERM GOALS: Target date: 05/29/2024   Pt will be decrease ODI disability score to no greater than 20% (10/50) as proxy for functional improvement Baseline: 36% disability (18/50) 04/17/2024: 24% disability (12/50) Goal status: IN PROGRESS  2.  Pt will self report low back pain no greater than 3/10 for improved comfort and functional ability Baseline: 9/10 at worst 04/17/24: 5/10 at worst 05/01/24: 5/10 at worst  Goal status: IN PROGRESS   3.  Pt will improve L hip MMT to no  less than 5/5 for improved functional ability and decrease back pain with transfers Baseline: see MMT chart Goal status: IN PROGRESS  4.  Pt will improve standing activity tolerance to be able to perform all home activities such as washing car  and yard work with no increase in back pain  Baseline: unable 04/17/2024: able to perform activities Goal status: MET  5.  Pt will increase 30 Second Sit to Stand rep count to no less than 10 reps for improved balance, strength, and functional mobility Baseline: 8 reps  04/16/2024: 10 reps 05/01/24: 12 reps Goal status: MET   PLAN:  PT FREQUENCY: 2x/week  PT DURATION: 8 weeks  PLANNED INTERVENTIONS: 97164- PT Re-evaluation, 97110-Therapeutic exercises, 97530- Therapeutic activity, 97112- Neuromuscular re-education, 97535- Self Care, 02859- Manual therapy, [id number]- Gait training, H9716- Electrical stimulation (unattended), [id number]- Electrical stimulation (manual), 20560 (1-2 muscles), 20561 (3+ muscles)- Dry Needling, Cryotherapy, and Moist heat.  PLAN FOR NEXT SESSION: assess HEP response, core/hip strengthening progressing in closed chain   Harlene Persons, PTA 05/01/24 8:41 AM Phone: [phone] Fax: [phone]

## 2024-05-03 ENCOUNTER — Ambulatory Visit

## 2024-05-03 ENCOUNTER — Other Ambulatory Visit: Payer: Self-pay

## 2024-05-03 DIAGNOSIS — M6281 Muscle weakness (generalized): Secondary | ICD-10-CM

## 2024-05-03 DIAGNOSIS — M5459 Other low back pain: Secondary | ICD-10-CM

## 2024-05-03 DIAGNOSIS — R2689 Other abnormalities of gait and mobility: Secondary | ICD-10-CM

## 2024-05-03 NOTE — Therapy (Signed)
 OUTPATIENT PHYSICAL THERAPY TREATMENT    Patient Name: Roy Reed MRN: [id number] DOB:03/10/1956, 68 y.o., male Today's Date: 05/03/2024  END OF SESSION:  PT End of Session - 05/03/24 0843     Visit Number 9    Number of Visits 17    Date for PT Re-Evaluation 05/29/24    Authorization Type Medicare    PT Start Time 0845    PT Stop Time 0925    PT Time Calculation (min) 40 min               Past Medical History:  Diagnosis Date   Diabetes mellitus without complication (HCC)    Elevated PSA    HLD (hyperlipidemia)    side effect from Tamsulosin    Left knee pain    Low back pain    Pre-diabetes    Prostate cancer (HCC)    Rotator cuff disorder, right    Rotator cuff tear arthropathy of right shoulder 08/31/2017   Sleep apnea    Past Surgical History:  Procedure Laterality Date   COLONOSCOPY     PROSTATE BIOPSY     PROSTATE BIOPSY     RADIOACTIVE SEED IMPLANT N/A 01/02/2018   Procedure: RADIOACTIVE SEED IMPLANT/BRACHYTHERAPY IMPLANT;  Surgeon: Sherrilee Belvie CROME, MD;  Location: [facility];  Service: Urology;  Laterality: N/A;   SHOULDER ARTHROSCOPY WITH ROTATOR CUFF REPAIR AND SUBACROMIAL DECOMPRESSION Right 08/05/2017   Procedure: RIGHT SHOULDER ARTHROSCOPY WITH ROTATOR CUFF REPAIR, DISTAL CLAVICLE EXCISION, DEBRIDEMENT AND SUBACROMIAL DECOMPRESSION;  Surgeon: Jerri Kay HERO, MD;  Location: [facility];  Service: Orthopedics;  Laterality: Right;   SPACE OAR INSTILLATION N/A 01/02/2018   Procedure: SPACE OAR INSTILLATION;  Surgeon: Sherrilee Belvie CROME, MD;  Location: [facility];  Service: Urology;  Laterality: N/A;   Patient Active Problem List   Diagnosis Date Noted   OSA (obstructive sleep apnea) 12/27/2022   Benign prostatic hyperplasia with urinary obstruction 01/21/2020   Normocytic anemia 11/30/2019   Elevated blood pressure reading 11/30/2019   Dyspnea on exertion 11/30/2019   Prediabetes 11/30/2019    Hyperlipidemia 08/15/2018   Obesity (BMI 30-39.9) 08/15/2018   Skin tag 08/15/2018   Malignant neoplasm of prostate (HCC) 10/13/2017   Screening for malignant neoplasm of colon 08/31/2017   Personal history of gastric ulcer 02/09/2017    PCP: Vicci Barnie NOVAK, MD  REFERRING PROVIDER: Theotis Haze ORN, NP  REFERRING DIAG: [phone] (ICD-10-CM) - Chronic left-sided low back pain without sciatica   Rationale for Evaluation and Treatment: Rehabilitation  THERAPY DIAG:  Other low back pain  Muscle weakness (generalized)  Other abnormalities of gait and mobility  ONSET DATE: Chronic  SUBJECTIVE:  SUBJECTIVE STATEMENT: Pt presents to PT with no current pain, has been compliant with HEP. Will fly to [employer].   PERTINENT HISTORY:  DM, hx of cancer  PAIN:  Are you having pain?  Yes: NPRS scale: 0/10 Worst: 9/10 Pain location: L lower back Pain description: sharp, tight Aggravating factors: position on L side, prolonged standing Relieving factors: massage, medication  PRECAUTIONS: None  RED FLAGS: None   WEIGHT BEARING RESTRICTIONS: No  FALLS:  Has patient fallen in last 6 months? Yes. Number of falls - 1 while playing soccer   LIVING ENVIRONMENT: Lives with: lives with their family Lives in: House/apartment  OCCUPATION: Retired  PLOF: Independent  PATIENT GOALS: be able to do work around the house with decrease pain  OBJECTIVE:  Note: Objective measures were completed at Evaluation unless otherwise noted.  DIAGNOSTIC FINDINGS:  See imaging   PATIENT SURVEYS:  ODI: 18/50 04/17/2024: 24% disability (12/50)  COGNITION: Overall cognitive status: Within functional limits for tasks assessed     SENSATION: WFL  MUSCLE LENGTH: Thomas test: Right (-); Left  (+)  POSTURE: rounded shoulders, forward head, and increased lumbar lordosis  PALPATION: TTP   leg length discrepancy 03/06/24  True L 97.5 cm R 98.3  Apparent  L 105.5 R 106.1  LUMBAR ROM:   AROM eval  Flexion 75%  Extension 25%  Right lateral flexion   Left lateral flexion   Right rotation 25%  Left rotation 50%   (Blank rows = not tested)  LOWER EXTREMITY MMT:    MMT Right eval Left eval Left 04/17/24  Hip flexion 5 4 5   Hip extension     Hip abduction 5 4 4   Hip adduction     Hip internal rotation     Hip external rotation     Knee flexion     Knee extension     Ankle dorsiflexion     Ankle plantarflexion     Ankle inversion     Ankle eversion      (Blank rows = not tested)  LUMBAR SPECIAL TESTS:  Straight leg raise test: Negative and Slump test: Negative  FUNCTIONAL TESTS:  30 Second Sit to Stand: 8 reps 05/01/24: 12 reps   GAIT: Distance walked: 64ft Assistive device utilized: None Level of assistance: Complete Independence Comments: trunk flexed, decreased gait speed  TREATMENT: [facility] Adult PT Treatment:                                                DATE: 05/03/24 Banded bridge 2x15 blue band Single leg bridge x 10 ea Pilates SLR 2x10 ea 90/90 hold 2x20 90/90 alt taps 2x5 STS squat to table 2x8 15lb KB Pallof press 2x10 13# ea FM row 2x14 23lb  [facility] Adult PT Treatment:                                                DATE: 05/01/24 Therapeutic Exercise: Banded bridge 15 x 2  blue  Pilates SLR 2x10 ea 90/90 hold 2x20 S/L hip abd x 15 ea, 2#  Seated ball lumbar flexion roll outs x 10.  Therapeutic Activity: 30 sec stand test: 12 reps  Standing FM down ward chop x 10 13# ea  Standin FM row 2x12 23# Palloff press 13# x 12 each way   [facility] Adult PT Treatment:                                                DATE: 04/26/24 Therapeutic Exercise: Bridge x 15  Pilates SLR 2x10 ea 90/90 hold 2x20 S/L hip abd x 15 ea Birddog x 10 Seated  lumbar flexion stretch swiss x 10 - 5 hold Therapeutic Activity: Standing FM chop x 10 10# ea Standin FM row 2x12 23# Palloff press 10# x 10 each way   [facility] Adult PT Treatment:                                                DATE: 04/19/24 Therapeutic Exercise: Seated lumbar flexion stretch with exercise ball  Figure 4 QL stretch Bridge x 10 90/90 hold 10 sec x 4  Therapeutic Activity: Standing free motion row 23# x 15  Standing free motions pull down 20# x 15  Palloff press 7# x 10 each way  STS 10# 10 x 2  Modalities: HMP lumbar x 10 minutes  [facility] Adult PT Treatment:                                                DATE: 04/17/24 Repeated flexion in sitting with swiss x 15 Bridge with GTB 2x15 Pilates SLR x 10 ea S/L hip abd x 15 2# ea Modified thomas stretch x 60 ea Assessment of tests/measures, goals, and outcomes for re-certification  [facility] Adult PT Treatment:                                                DATE: 03/21/24 Therapeutic Exercise: Seated lumbar flexion stretch with all, and laterals x 3 each  Seated hamstring stretch 15 sec x 2 each  STS x 10  SLR with ab brace 10 x 2 each -began to have more pain so discontinued lat set Bridge x 10  LTR SKTC Hip flexor stretch60 sec each  S/L clamshell x 15 BTB each   [facility] Adult PT Treatment:                                                DATE: 03/19/24 Supine hamstring stretch with strap 2x30 L Bridge 2x10 S/L clamshell x 15 GTB Supine SLR 2x10 S/L hip abd x 10 ea LAD Grade IV L Seated hamstring stretch 2x30 ea  PATIENT EDUCATION:  Education details: HEP and POC extension Person educated: Patient Education method: Explanation, Demonstration, and Handouts Education comprehension: verbalized understanding and returned demonstration  HOME EXERCISE PROGRAM: Access Code: [email or web address] URL: [email or web address] Date: 03/19/2024 Prepared by: Alm Kingdom  Exercises - Modified Thomas Stretch  - 1 x daily  - 7 x weekly - 2 reps - 60 seconds hold - Supine  Lower Trunk Rotation  - 1 x daily - 7 x weekly - 2 sets - 10 reps - 5 seconds hold - Supine Posterior Pelvic Tilt  - 1 x daily - 7 x weekly - 2 sets - 10 reps - 3-5 seconds hold - Clamshell with Resistance (Mirrored)  - 1 x daily - 7 x weekly - 2 sets - 10 reps - rouge  hold - SLR  - 1 x daily - 7 x weekly - 2 sets - 10 reps - 1 hold - Seated Hamstring Stretch  - 1 x daily - 7 x weekly - 2 sets - 2 reps - 30 hold - QL stretch (Mirrored)  - 2 x daily - 7 x weekly - 2 sets - 2 reps - 30 seconds hold - Supine Bridge  - 1 x daily - 7 x weekly - 2-3 sets - 10 reps  ASSESSMENT:  CLINICAL IMPRESSION: Pt was able to complete all prescribed exercises with no adverse effect. Today we continued to progress core and overall strength in order to decrease pain with activity. Pt is progressing well with therapy, continues to benefit from skilled services with progression focused on core.   Re-eval: Pt was able to complete prescribed exercises with no adverse effect. Over the course of PT treatment he has made some progress towards decreasing LBP and improving function. Severe of overall pain has decreased and he has met his goals for 30 Second Sit to Stand and being able to perform yard work. His ODI has improved but is still below MDC values. He would continue to benefit from skilled therapeutic interventions as he had to take a month off for travel and is not quite back to PLOF. Pt is in agreement with current plan.   EVALUATION: Patient is a 68 y.o. M who was seen today for physical therapy evaluation and treatment for chronic L sided LBP. Physical findings are consistent with MD impression as pt demonstrates decrease in lumbar ROM, L hip strength, and core endurance. Also demos decrease in hip flexor muscle length. ODI score shows moderate disability in performance of home ADLs and community activities. Pt would benefit from skilled PT services for work on  improving core/hip strength and hip flexor tightness in order to decrease pain and improve comfort.    OBJECTIVE IMPAIRMENTS: Abnormal gait, decreased activity tolerance, decreased mobility, difficulty walking, decreased ROM, decreased strength, and pain   ACTIVITY LIMITATIONS: carrying, lifting, sitting, standing, squatting, stairs, transfers, and locomotion level  PARTICIPATION LIMITATIONS: meal prep, cleaning, laundry, driving, shopping, community activity, occupation, and yard work  PERSONAL FACTORS: Time since onset of injury/illness/exacerbation and 1-2 comorbidities: DM, hx of cancer are also affecting patient's functional outcome.   REHAB POTENTIAL: Excellent  CLINICAL DECISION MAKING: Stable/uncomplicated  EVALUATION COMPLEXITY: Low   GOALS: Goals reviewed with patient? No  SHORT TERM GOALS: Target date: 03/19/2024   Pt will be compliant and knowledgeable with initial HEP for improved comfort and carryover Baseline: initial HEP given  Goal status: MET  2.  Pt will self report low back pain no greater than 5/10 for improved comfort and functional ability Baseline: 9/10 at worst 04/17/24: 5/10 at worst Goal status: MET   LONG TERM GOALS: Target date: 05/29/2024   Pt will be decrease ODI disability score to no greater than 20% (10/50) as proxy for functional improvement Baseline: 36% disability (18/50) 04/17/2024: 24% disability (12/50) Goal status: IN PROGRESS  2.  Pt will self report low back pain no greater than  3/10 for improved comfort and functional ability Baseline: 9/10 at worst 04/17/24: 5/10 at worst 05/01/24: 5/10 at worst  Goal status: IN PROGRESS   3.  Pt will improve L hip MMT to no less than 5/5 for improved functional ability and decrease back pain with transfers Baseline: see MMT chart Goal status: IN PROGRESS  4.  Pt will improve standing activity tolerance to be able to perform all home activities such as washing car and yard work with no increase in  back pain  Baseline: unable 04/17/2024: able to perform activities Goal status: MET  5.  Pt will increase 30 Second Sit to Stand rep count to no less than 10 reps for improved balance, strength, and functional mobility Baseline: 8 reps  04/16/2024: 10 reps 05/01/24: 12 reps Goal status: MET   PLAN:  PT FREQUENCY: 2x/week  PT DURATION: 8 weeks  PLANNED INTERVENTIONS: 97164- PT Re-evaluation, 97110-Therapeutic exercises, 97530- Therapeutic activity, 97112- Neuromuscular re-education, 97535- Self Care, [id number]- Manual therapy, [id number]- Gait training, [id number]- Electrical stimulation (unattended), [id number]- Electrical stimulation (manual), 20560 (1-2 muscles), 20561 (3+ muscles)- Dry Needling, Cryotherapy, and Moist heat.  PLAN FOR NEXT SESSION: assess HEP response, core/hip strengthening progressing in closed chain   Alm JAYSON Kingdom PT  05/03/24 9:33 AM

## 2024-05-08 ENCOUNTER — Ambulatory Visit: Attending: Internal Medicine

## 2024-05-08 ENCOUNTER — Other Ambulatory Visit: Payer: Self-pay

## 2024-05-08 DIAGNOSIS — M5459 Other low back pain: Secondary | ICD-10-CM | POA: Insufficient documentation

## 2024-05-08 DIAGNOSIS — M6281 Muscle weakness (generalized): Secondary | ICD-10-CM | POA: Insufficient documentation

## 2024-05-08 DIAGNOSIS — R2689 Other abnormalities of gait and mobility: Secondary | ICD-10-CM | POA: Insufficient documentation

## 2024-05-08 NOTE — Therapy (Signed)
 OUTPATIENT PHYSICAL THERAPY TREATMENT    Patient Name: Justyce Baby MRN: [id number] DOB:[birth year], 68 y.o., male Today's Date: 05/09/2024  END OF SESSION:  PT End of Session - 05/08/24 1618     Visit Number 10    Number of Visits 17    Date for PT Re-Evaluation 05/29/24    Authorization Type Medicare    PT Start Time 1618    PT Stop Time 1658    PT Time Calculation (min) 40 min                Past Medical History:  Diagnosis Date   Diabetes mellitus without complication (HCC)    Elevated PSA    HLD (hyperlipidemia)    side effect from Tamsulosin    Left knee pain    Low back pain    Pre-diabetes    Prostate cancer (HCC)    Rotator cuff disorder, right    Rotator cuff tear arthropathy of right shoulder 08/31/2017   Sleep apnea    Past Surgical History:  Procedure Laterality Date   COLONOSCOPY     PROSTATE BIOPSY     PROSTATE BIOPSY     RADIOACTIVE SEED IMPLANT N/A 01/02/2018   Procedure: RADIOACTIVE SEED IMPLANT/BRACHYTHERAPY IMPLANT;  Surgeon: Sherrilee Belvie CROME, MD;  Location: [facility];  Service: Urology;  Laterality: N/A;   SHOULDER ARTHROSCOPY WITH ROTATOR CUFF REPAIR AND SUBACROMIAL DECOMPRESSION Right 08/05/2017   Procedure: RIGHT SHOULDER ARTHROSCOPY WITH ROTATOR CUFF REPAIR, DISTAL CLAVICLE EXCISION, DEBRIDEMENT AND SUBACROMIAL DECOMPRESSION;  Surgeon: Jerri Kay HERO, MD;  Location: [facility];  Service: Orthopedics;  Laterality: Right;   SPACE OAR INSTILLATION N/A 01/02/2018   Procedure: SPACE OAR INSTILLATION;  Surgeon: Sherrilee Belvie CROME, MD;  Location: [facility];  Service: Urology;  Laterality: N/A;   Patient Active Problem List   Diagnosis Date Noted   OSA (obstructive sleep apnea) 12/27/2022   Benign prostatic hyperplasia with urinary obstruction 01/21/2020   Normocytic anemia 11/30/2019   Elevated blood pressure reading 11/30/2019   Dyspnea on exertion 11/30/2019   Prediabetes 11/30/2019    Hyperlipidemia 08/15/2018   Obesity (BMI 30-39.9) 08/15/2018   Skin tag 08/15/2018   Malignant neoplasm of prostate (HCC) 10/13/2017   Screening for malignant neoplasm of colon 08/31/2017   Personal history of gastric ulcer 02/09/2017    PCP: Vicci Barnie NOVAK, MD  REFERRING PROVIDER: Theotis Haze ORN, NP  REFERRING DIAG: [phone] (ICD-10-CM) - Chronic left-sided low back pain without sciatica   Rationale for Evaluation and Treatment: Rehabilitation  THERAPY DIAG:  Other low back pain  Muscle weakness (generalized)  Other abnormalities of gait and mobility  ONSET DATE: Chronic  SUBJECTIVE:  SUBJECTIVE STATEMENT: Pt presents to PT with reports of no current   PERTINENT HISTORY:  DM, hx of cancer  PAIN:  Are you having pain?  Yes: NPRS scale: 0/10 Worst: 9/10 Pain location: L lower back Pain description: sharp, tight Aggravating factors: position on L side, prolonged standing Relieving factors: massage, medication  PRECAUTIONS: None  RED FLAGS: None   WEIGHT BEARING RESTRICTIONS: No  FALLS:  Has patient fallen in last 6 months? Yes. Number of falls - 1 while playing soccer   LIVING ENVIRONMENT: Lives with: lives with their family Lives in: House/apartment  OCCUPATION: Retired  PLOF: Independent  PATIENT GOALS: be able to do work around the house with decrease pain  OBJECTIVE:  Note: Objective measures were completed at Evaluation unless otherwise noted.  DIAGNOSTIC FINDINGS:  See imaging   PATIENT SURVEYS:  ODI: 18/50 04/17/2024: 24% disability (12/50)  COGNITION: Overall cognitive status: Within functional limits for tasks assessed     SENSATION: WFL  MUSCLE LENGTH: Thomas test: Right (-); Left (+)  POSTURE: rounded shoulders, forward head, and  increased lumbar lordosis  PALPATION: TTP   leg length discrepancy 03/06/24  True L 97.5 cm R 98.3  Apparent  L 105.5 R 106.1  LUMBAR ROM:   AROM eval  Flexion 75%  Extension 25%  Right lateral flexion   Left lateral flexion   Right rotation 25%  Left rotation 50%   (Blank rows = not tested)  LOWER EXTREMITY MMT:    MMT Right eval Left eval Left 04/17/24  Hip flexion 5 4 5   Hip extension     Hip abduction 5 4 4   Hip adduction     Hip internal rotation     Hip external rotation     Knee flexion     Knee extension     Ankle dorsiflexion     Ankle plantarflexion     Ankle inversion     Ankle eversion      (Blank rows = not tested)  LUMBAR SPECIAL TESTS:  Straight leg raise test: Negative and Slump test: Negative  FUNCTIONAL TESTS:  30 Second Sit to Stand: 8 reps 05/01/24: 12 reps   GAIT: Distance walked: 30ft Assistive device utilized: None Level of assistance: Complete Independence Comments: trunk flexed, decreased gait speed  TREATMENT: [facility] Adult PT Treatment:                                                DATE: 05/08/24 Bridge x 10 Single leg bridge x 10 ea 90/90 hold 2x20 90/90 alt taps x 10 Bird dog x 10 FM row 2x12 23lb Pallof press 2x10 13lb ea Standing hip abd 2x10 25lb STS squat to table 2x8 15lb KB  [facility] Adult PT Treatment:                                                DATE: 05/03/24 Banded bridge 2x15 blue band Single leg bridge x 10 ea Pilates SLR 2x10 ea 90/90 hold 2x20 90/90 alt taps 2x5 STS squat to table 2x8 15lb KB Pallof press 2x10 13# ea FM row 2x14 23lb  [facility] Adult PT Treatment:  DATE: 05/01/24 Therapeutic Exercise: Banded bridge 15 x 2  blue  Pilates SLR 2x10 ea 90/90 hold 2x20 S/L hip abd x 15 ea, 2#  Seated ball lumbar flexion roll outs x 10.  Therapeutic Activity: 30 sec stand test: 12 reps  Standing FM down ward chop x 10 13# ea Standin FM row 2x12 23# Palloff  press 13# x 12 each way   [facility] Adult PT Treatment:                                                DATE: 04/26/24 Therapeutic Exercise: Bridge x 15  Pilates SLR 2x10 ea 90/90 hold 2x20 S/L hip abd x 15 ea Birddog x 10 Seated lumbar flexion stretch swiss x 10 - 5 hold Therapeutic Activity: Standing FM chop x 10 10# ea Standin FM row 2x12 23# Palloff press 10# x 10 each way   [facility] Adult PT Treatment:                                                DATE: 04/19/24 Therapeutic Exercise: Seated lumbar flexion stretch with exercise ball  Figure 4 QL stretch Bridge x 10 90/90 hold 10 sec x 4  Therapeutic Activity: Standing free motion row 23# x 15  Standing free motions pull down 20# x 15  Palloff press 7# x 10 each way  STS 10# 10 x 2  Modalities: HMP lumbar x 10 minutes  [facility] Adult PT Treatment:                                                DATE: 04/17/24 Repeated flexion in sitting with swiss x 15 Bridge with GTB 2x15 Pilates SLR x 10 ea S/L hip abd x 15 2# ea Modified thomas stretch x 60 ea Assessment of tests/measures, goals, and outcomes for re-certification  [facility] Adult PT Treatment:                                                DATE: 03/21/24 Therapeutic Exercise: Seated lumbar flexion stretch with all, and laterals x 3 each  Seated hamstring stretch 15 sec x 2 each  STS x 10  SLR with ab brace 10 x 2 each -began to have more pain so discontinued lat set Bridge x 10  LTR SKTC Hip flexor stretch60 sec each  S/L clamshell x 15 BTB each   [facility] Adult PT Treatment:                                                DATE: 03/19/24 Supine hamstring stretch with strap 2x30 L Bridge 2x10 S/L clamshell x 15 GTB Supine SLR 2x10 S/L hip abd x 10 ea LAD Grade IV L Seated hamstring stretch 2x30 ea  PATIENT EDUCATION:  Education details: HEP and POC extension Person educated:  Patient Education method: Explanation, Demonstration, and Handouts Education comprehension:  verbalized understanding and returned demonstration  HOME EXERCISE PROGRAM: Access Code: [email or web address] URL: [email or web address] Date: 03/19/2024 Prepared by: Alm Kingdom  Exercises - Modified Thomas Stretch  - 1 x daily - 7 x weekly - 2 reps - 60 seconds hold - Supine Lower Trunk Rotation  - 1 x daily - 7 x weekly - 2 sets - 10 reps - 5 seconds hold - Supine Posterior Pelvic Tilt  - 1 x daily - 7 x weekly - 2 sets - 10 reps - 3-5 seconds hold - Clamshell with Resistance (Mirrored)  - 1 x daily - 7 x weekly - 2 sets - 10 reps - rouge  hold - SLR  - 1 x daily - 7 x weekly - 2 sets - 10 reps - 1 hold - Seated Hamstring Stretch  - 1 x daily - 7 x weekly - 2 sets - 2 reps - 30 hold - QL stretch (Mirrored)  - 2 x daily - 7 x weekly - 2 sets - 2 reps - 30 seconds hold - Supine Bridge  - 1 x daily - 7 x weekly - 2-3 sets - 10 reps  ASSESSMENT:  CLINICAL IMPRESSION: Pt was able to complete all prescribed exercises with no adverse effect. Today we continued to progress core and overall strength in order to decrease pain with activity. Pt is progressing well with therapy, continues to benefit from skilled services with progression focused on core. Will assess goals and discharge readiness at next session.   Re-eval: Pt was able to complete prescribed exercises with no adverse effect. Over the course of PT treatment he has made some progress towards decreasing LBP and improving function. Severe of overall pain has decreased and he has met his goals for 30 Second Sit to Stand and being able to perform yard work. His ODI has improved but is still below MDC values. He would continue to benefit from skilled therapeutic interventions as he had to take a month off for travel and is not quite back to PLOF. Pt is in agreement with current plan.   EVALUATION: Patient is a 68 y.o. M who was seen today for physical therapy evaluation and treatment for chronic L sided LBP. Physical findings are  consistent with MD impression as pt demonstrates decrease in lumbar ROM, L hip strength, and core endurance. Also demos decrease in hip flexor muscle length. ODI score shows moderate disability in performance of home ADLs and community activities. Pt would benefit from skilled PT services for work on improving core/hip strength and hip flexor tightness in order to decrease pain and improve comfort.    OBJECTIVE IMPAIRMENTS: Abnormal gait, decreased activity tolerance, decreased mobility, difficulty walking, decreased ROM, decreased strength, and pain   ACTIVITY LIMITATIONS: carrying, lifting, sitting, standing, squatting, stairs, transfers, and locomotion level  PARTICIPATION LIMITATIONS: meal prep, cleaning, laundry, driving, shopping, community activity, occupation, and yard work  PERSONAL FACTORS: Time since onset of injury/illness/exacerbation and 1-2 comorbidities: DM, hx of cancer are also affecting patient's functional outcome.   REHAB POTENTIAL: Excellent  CLINICAL DECISION MAKING: Stable/uncomplicated  EVALUATION COMPLEXITY: Low   GOALS: Goals reviewed with patient? No  SHORT TERM GOALS: Target date: 03/19/2024   Pt will be compliant and knowledgeable with initial HEP for improved comfort and carryover Baseline: initial HEP given  Goal status: MET  2.  Pt will self report low back pain no greater than 5/10 for improved comfort and functional ability Baseline:  9/10 at worst 04/17/24: 5/10 at worst Goal status: MET   LONG TERM GOALS: Target date: 05/29/2024   Pt will be decrease ODI disability score to no greater than 20% (10/50) as proxy for functional improvement Baseline: 36% disability (18/50) 04/17/2024: 24% disability (12/50) Goal status: IN PROGRESS  2.  Pt will self report low back pain no greater than 3/10 for improved comfort and functional ability Baseline: 9/10 at worst 04/17/24: 5/10 at worst 05/01/24: 5/10 at worst  Goal status: IN PROGRESS   3.  Pt will  improve L hip MMT to no less than 5/5 for improved functional ability and decrease back pain with transfers Baseline: see MMT chart Goal status: IN PROGRESS  4.  Pt will improve standing activity tolerance to be able to perform all home activities such as washing car and yard work with no increase in back pain  Baseline: unable 04/17/2024: able to perform activities Goal status: MET  5.  Pt will increase 30 Second Sit to Stand rep count to no less than 10 reps for improved balance, strength, and functional mobility Baseline: 8 reps  04/16/2024: 10 reps 05/01/24: 12 reps Goal status: MET   PLAN:  PT FREQUENCY: 2x/week  PT DURATION: 8 weeks  PLANNED INTERVENTIONS: 97164- PT Re-evaluation, 97110-Therapeutic exercises, 97530- Therapeutic activity, 97112- Neuromuscular re-education, 97535- Self Care, [id number]- Manual therapy, [id number]- Gait training, [id number]- Electrical stimulation (unattended), [id number]- Electrical stimulation (manual), 20560 (1-2 muscles), 20561 (3+ muscles)- Dry Needling, Cryotherapy, and Moist heat.  PLAN FOR NEXT SESSION: assess HEP response, core/hip strengthening progressing in closed chain   Alm JAYSON Kingdom PT  05/09/24 8:06 AM

## 2024-05-10 ENCOUNTER — Ambulatory Visit

## 2024-05-10 DIAGNOSIS — M5459 Other low back pain: Secondary | ICD-10-CM

## 2024-05-10 DIAGNOSIS — M6281 Muscle weakness (generalized): Secondary | ICD-10-CM

## 2024-05-10 DIAGNOSIS — R2689 Other abnormalities of gait and mobility: Secondary | ICD-10-CM

## 2024-05-10 NOTE — Therapy (Addendum)
 OUTPATIENT PHYSICAL THERAPY TREATMENT/DISCHARGE  PHYSICAL THERAPY DISCHARGE SUMMARY  Visits from Start of Care: 11  Current functional level related to goals / functional outcomes: See goals and objective   Remaining deficits: See goals and objective   Education / Equipment: HEP   Patient agrees to discharge. Patient goals were met. Patient is being discharged due to meeting the stated rehab goals.   Patient Name: Roy Reed MRN: [id number] DOB:[birth year], 68 y.o., male Today's Date: 05/10/2024  END OF SESSION:  PT End of Session - 05/10/24 0855     Visit Number 11    Number of Visits 17    Date for PT Re-Evaluation 05/29/24    Authorization Type Medicare    PT Start Time 0855   arrived late   PT Stop Time 0933    PT Time Calculation (min) 38 min                 Past Medical History:  Diagnosis Date   Diabetes mellitus without complication (HCC)    Elevated PSA    HLD (hyperlipidemia)    side effect from Tamsulosin    Left knee pain    Low back pain    Pre-diabetes    Prostate cancer (HCC)    Rotator cuff disorder, right    Rotator cuff tear arthropathy of right shoulder 08/31/2017   Sleep apnea    Past Surgical History:  Procedure Laterality Date   COLONOSCOPY     PROSTATE BIOPSY     PROSTATE BIOPSY     RADIOACTIVE SEED IMPLANT N/A 01/02/2018   Procedure: RADIOACTIVE SEED IMPLANT/BRACHYTHERAPY IMPLANT;  Surgeon: Sherrilee Belvie CROME, MD;  Location: [facility];  Service: Urology;  Laterality: N/A;   SHOULDER ARTHROSCOPY WITH ROTATOR CUFF REPAIR AND SUBACROMIAL DECOMPRESSION Right 08/05/2017   Procedure: RIGHT SHOULDER ARTHROSCOPY WITH ROTATOR CUFF REPAIR, DISTAL CLAVICLE EXCISION, DEBRIDEMENT AND SUBACROMIAL DECOMPRESSION;  Surgeon: Jerri Kay HERO, MD;  Location: [facility];  Service: Orthopedics;  Laterality: Right;   SPACE OAR INSTILLATION N/A 01/02/2018   Procedure: SPACE OAR INSTILLATION;  Surgeon: Sherrilee Belvie CROME, MD;  Location: [facility];  Service: Urology;  Laterality: N/A;   Patient Active Problem List   Diagnosis Date Noted   OSA (obstructive sleep apnea) 12/27/2022   Benign prostatic hyperplasia with urinary obstruction 01/21/2020   Normocytic anemia 11/30/2019   Elevated blood pressure reading 11/30/2019   Dyspnea on exertion 11/30/2019   Prediabetes 11/30/2019   Hyperlipidemia 08/15/2018   Obesity (BMI 30-39.9) 08/15/2018   Skin tag 08/15/2018   Malignant neoplasm of prostate (HCC) 10/13/2017   Screening for malignant neoplasm of colon 08/31/2017   Personal history of gastric ulcer 02/09/2017    PCP: Vicci Barnie NOVAK, MD  REFERRING PROVIDER: Theotis Haze ORN, NP  REFERRING DIAG: [phone] (ICD-10-CM) - Chronic left-sided low back pain without sciatica   Rationale for Evaluation and Treatment: Rehabilitation  THERAPY DIAG:  Other low back pain  Muscle weakness (generalized)  Other abnormalities of gait and mobility  ONSET DATE: Chronic  SUBJECTIVE:  SUBJECTIVE STATEMENT: PT presents to PT with no current pain. Pain has been 3/10 at worst. Is able to play tennis and soccer not limited by pain.   PERTINENT HISTORY:  DM, hx of cancer  PAIN:  Are you having pain?  Yes: NPRS scale: 0/10 Worst: 9/10 Pain location: L lower back Pain description: sharp, tight Aggravating factors: position on L side, prolonged standing Relieving factors: massage, medication  PRECAUTIONS: None  RED FLAGS: None   WEIGHT BEARING RESTRICTIONS: No  FALLS:  Has patient fallen in last 6 months? Yes. Number of falls - 1 while playing soccer   LIVING ENVIRONMENT: Lives with: lives with their family Lives in: House/apartment  OCCUPATION: Retired  PLOF:  Independent  PATIENT GOALS: be able to do work around the house with decrease pain  OBJECTIVE:  Note: Objective measures were completed at Evaluation unless otherwise noted.  DIAGNOSTIC FINDINGS:  See imaging   PATIENT SURVEYS:  ODI: 18/50 04/17/2024: 24% disability (12/50) 05/10/2024: 18% disability (9/50)  COGNITION: Overall cognitive status: Within functional limits for tasks assessed     SENSATION: WFL  MUSCLE LENGTH: Thomas test: Right (-); Left (+)  POSTURE: rounded shoulders, forward head, and increased lumbar lordosis  PALPATION: TTP   leg length discrepancy 03/06/24  True L 97.5 cm R 98.3  Apparent  L 105.5 R 106.1  LUMBAR ROM:   AROM eval  Flexion 75%  Extension 25%  Right lateral flexion   Left lateral flexion   Right rotation 25%  Left rotation 50%   (Blank rows = not tested)  LOWER EXTREMITY MMT:    MMT Right eval Left eval Left 04/17/24  Hip flexion 5 4 5   Hip extension     Hip abduction 5 4 4   Hip adduction     Hip internal rotation     Hip external rotation     Knee flexion     Knee extension     Ankle dorsiflexion     Ankle plantarflexion     Ankle inversion     Ankle eversion      (Blank rows = not tested)  LUMBAR SPECIAL TESTS:  Straight leg raise test: Negative and Slump test: Negative  FUNCTIONAL TESTS:  30 Second Sit to Stand: 8 reps 05/01/24: 12 reps   GAIT: Distance walked: 46ft Assistive device utilized: None Level of assistance: Complete Independence Comments: trunk flexed, decreased gait speed  TREATMENT: [facility] Adult PT Treatment:                                                DATE: 9/4//25 Assessment of tests/measures, goals, and outcomes for discharge Banded bridge 2x15 black band Single leg bridge x 10 ea 90/90 hold 2x20 90/90 alt taps x 10 S/L hip abd 2x15 ea Bird dog x 10  [facility] Adult PT Treatment:                                                DATE: 05/03/24 Banded bridge 2x15 blue band Single leg  bridge x 10 ea Pilates SLR 2x10 ea 90/90 hold 2x20 90/90 alt taps 2x5 STS squat to table 2x8 15lb KB Pallof press 2x10 13# ea FM row 2x14 23lb  [facility] Adult PT  Treatment:                                                DATE: 05/01/24 Therapeutic Exercise: Banded bridge 15 x 2  blue  Pilates SLR 2x10 ea 90/90 hold 2x20 S/L hip abd x 15 ea, 2#  Seated ball lumbar flexion roll outs x 10.  Therapeutic Activity: 30 sec stand test: 12 reps  Standing FM down ward chop x 10 13# ea Standin FM row 2x12 23# Palloff press 13# x 12 each way   [facility] Adult PT Treatment:                                                DATE: 04/26/24 Therapeutic Exercise: Bridge x 15  Pilates SLR 2x10 ea 90/90 hold 2x20 S/L hip abd x 15 ea Birddog x 10 Seated lumbar flexion stretch swiss x 10 - 5 hold Therapeutic Activity: Standing FM chop x 10 10# ea Standin FM row 2x12 23# Palloff press 10# x 10 each way   [facility] Adult PT Treatment:                                                DATE: 04/19/24 Therapeutic Exercise: Seated lumbar flexion stretch with exercise ball  Figure 4 QL stretch Bridge x 10 90/90 hold 10 sec x 4  Therapeutic Activity: Standing free motion row 23# x 15  Standing free motions pull down 20# x 15  Palloff press 7# x 10 each way  STS 10# 10 x 2  Modalities: HMP lumbar x 10 minutes  [facility] Adult PT Treatment:                                                DATE: 04/17/24 Repeated flexion in sitting with swiss x 15 Bridge with GTB 2x15 Pilates SLR x 10 ea S/L hip abd x 15 2# ea Modified thomas stretch x 60 ea Assessment of tests/measures, goals, and outcomes for re-certification  [facility] Adult PT Treatment:                                                DATE: 03/21/24 Therapeutic Exercise: Seated lumbar flexion stretch with all, and laterals x 3 each  Seated hamstring stretch 15 sec x 2 each  STS x 10  SLR with ab brace 10 x 2 each -began to have more pain so discontinued lat  set Bridge x 10  LTR SKTC Hip flexor stretch60 sec each  S/L clamshell x 15 BTB each   [facility] Adult PT Treatment:  DATE: 03/19/24 Supine hamstring stretch with strap 2x30 L Bridge 2x10 S/L clamshell x 15 GTB Supine SLR 2x10 S/L hip abd x 10 ea LAD Grade IV L Seated hamstring stretch 2x30 ea  PATIENT EDUCATION:  Education details: HEP and POC extension Person educated: Patient Education method: Explanation, Demonstration, and Handouts Education comprehension: verbalized understanding and returned demonstration  HOME EXERCISE PROGRAM: Access Code: [email or web address] URL: [email or web address] Date: 05/10/2024 Prepared by: Alm Kingdom  Exercises - Supine Bridge with Resistance Band  - 3-4 x weekly - 3 sets - 15 reps - black band hold - Single Leg Bridge  - 3-4 x weekly - 2 sets - 10 reps - Supine 90/90 Abdominal Bracing  - 3-4 x weekly - 2-3 reps - 20 sec hold - Supine 90/90 Alternating Heel Touches with Posterior Pelvic Tilt  - 3-4 x weekly - 2 sets - 10 reps - Sidelying Hip Abduction  - 3-4 x weekly - 2 sets - 15 reps - Bird Dog  - 3-4 x weekly - 2 sets - 10 reps  ASSESSMENT:  CLINICAL IMPRESSION: Pt was able to complete all prescribed exercises with no adverse effect and demonstrated knowledge of HEP. Over the course of PT treatment he has progressed very well and met al LTGs, noting decrease in pain and is able to play tennis without limitation. He should continue to improve with HEP compliance and is ready to discharge at this time.   Re-eval: Pt was able to complete prescribed exercises with no adverse effect. Over the course of PT treatment he has made some progress towards decreasing LBP and improving function. Severe of overall pain has decreased and he has met his goals for 30 Second Sit to Stand and being able to perform yard work. His ODI has improved but is still below MDC values. He would continue to benefit from  skilled therapeutic interventions as he had to take a month off for travel and is not quite back to PLOF. Pt is in agreement with current plan.   EVALUATION: Patient is a 68 y.o. M who was seen today for physical therapy evaluation and treatment for chronic L sided LBP. Physical findings are consistent with MD impression as pt demonstrates decrease in lumbar ROM, L hip strength, and core endurance. Also demos decrease in hip flexor muscle length. ODI score shows moderate disability in performance of home ADLs and community activities. Pt would benefit from skilled PT services for work on improving core/hip strength and hip flexor tightness in order to decrease pain and improve comfort.    OBJECTIVE IMPAIRMENTS: Abnormal gait, decreased activity tolerance, decreased mobility, difficulty walking, decreased ROM, decreased strength, and pain   ACTIVITY LIMITATIONS: carrying, lifting, sitting, standing, squatting, stairs, transfers, and locomotion level  PARTICIPATION LIMITATIONS: meal prep, cleaning, laundry, driving, shopping, community activity, occupation, and yard work  PERSONAL FACTORS: Time since onset of injury/illness/exacerbation and 1-2 comorbidities: DM, hx of cancer are also affecting patient's functional outcome.   REHAB POTENTIAL: Excellent  CLINICAL DECISION MAKING: Stable/uncomplicated  EVALUATION COMPLEXITY: Low   GOALS: Goals reviewed with patient? No  SHORT TERM GOALS: Target date: 03/19/2024   Pt will be compliant and knowledgeable with initial HEP for improved comfort and carryover Baseline: initial HEP given  Goal status: MET  2.  Pt will self report low back pain no greater than 5/10 for improved comfort and functional ability Baseline: 9/10 at worst 04/17/24: 5/10 at worst Goal status: MET   LONG TERM GOALS: Target date: 05/29/2024   Pt  will be decrease ODI disability score to no greater than 20% (10/50) as proxy for functional improvement Baseline: 36%  disability (18/50) 04/17/2024: 24% disability (12/50) 05/10/2024: 18% disability (9/50) Goal status: IN PROGRESS  2.  Pt will self report low back pain no greater than 3/10 for improved comfort and functional ability Baseline: 9/10 at worst 04/17/24: 5/10 at worst 05/01/24: 5/10 at worst  Goal status: IN PROGRESS   3.  Pt will improve L hip MMT to no less than 5/5 for improved functional ability and decrease back pain with transfers Baseline: see MMT chart Goal status: IN PROGRESS  4.  Pt will improve standing activity tolerance to be able to perform all home activities such as washing car and yard work with no increase in back pain  Baseline: unable 04/17/2024: able to perform activities Goal status: MET  5.  Pt will increase 30 Second Sit to Stand rep count to no less than 10 reps for improved balance, strength, and functional mobility Baseline: 8 reps  04/16/2024: 10 reps 05/01/24: 12 reps Goal status: MET   PLAN:  PT FREQUENCY: 2x/week  PT DURATION: 8 weeks  PLANNED INTERVENTIONS: 97164- PT Re-evaluation, 97110-Therapeutic exercises, 97530- Therapeutic activity, 97112- Neuromuscular re-education, 97535- Self Care, 02859- Manual therapy, [id number]- Gait training, H9716- Electrical stimulation (unattended), [id number]- Electrical stimulation (manual), 20560 (1-2 muscles), 20561 (3+ muscles)- Dry Needling, Cryotherapy, and Moist heat.  PLAN FOR NEXT SESSION: assess HEP response, core/hip strengthening progressing in closed chain   Alm JAYSON Kingdom PT  05/10/24 9:38 AM

## 2024-06-04 ENCOUNTER — Other Ambulatory Visit: Payer: Self-pay

## 2024-06-06 ENCOUNTER — Other Ambulatory Visit: Payer: Self-pay

## 2024-06-06 DIAGNOSIS — C61 Malignant neoplasm of prostate: Secondary | ICD-10-CM

## 2024-06-11 ENCOUNTER — Other Ambulatory Visit: Payer: Self-pay

## 2024-06-11 DIAGNOSIS — C61 Malignant neoplasm of prostate: Secondary | ICD-10-CM

## 2024-06-12 ENCOUNTER — Ambulatory Visit: Payer: Self-pay | Admitting: Urology

## 2024-06-12 LAB — PSA: Prostate Specific Ag, Serum: <0.1 ng/mL (ref 0.0–4.0)

## 2024-06-13 ENCOUNTER — Encounter: Payer: Self-pay | Admitting: Urology

## 2024-06-13 ENCOUNTER — Other Ambulatory Visit: Payer: Self-pay

## 2024-06-13 ENCOUNTER — Ambulatory Visit: Payer: Self-pay | Admitting: Urology

## 2024-06-13 VITALS — BP 133/79 | HR 80

## 2024-06-13 DIAGNOSIS — N138 Other obstructive and reflux uropathy: Secondary | ICD-10-CM | POA: Diagnosis not present

## 2024-06-13 DIAGNOSIS — R3912 Poor urinary stream: Secondary | ICD-10-CM | POA: Diagnosis not present

## 2024-06-13 DIAGNOSIS — N401 Enlarged prostate with lower urinary tract symptoms: Secondary | ICD-10-CM | POA: Diagnosis not present

## 2024-06-13 DIAGNOSIS — C61 Malignant neoplasm of prostate: Secondary | ICD-10-CM | POA: Diagnosis not present

## 2024-06-13 LAB — URINALYSIS, ROUTINE W REFLEX MICROSCOPIC
Bilirubin, UA: NEGATIVE
Glucose, UA: NEGATIVE
Ketones, UA: NEGATIVE
Leukocytes,UA: NEGATIVE
Nitrite, UA: NEGATIVE
Protein,UA: NEGATIVE
Specific Gravity, UA: 1.015 (ref 1.005–1.030)
Urobilinogen, Ur: 0.2 mg/dL (ref 0.2–1.0)
pH, UA: 6 (ref 5.0–7.5)

## 2024-06-13 LAB — MICROSCOPIC EXAMINATION
Bacteria, UA: NONE SEEN
WBC, UA: NONE SEEN /HPF (ref 0–5)

## 2024-06-13 MED ORDER — SILDENAFIL CITRATE 100 MG PO TABS
100.0000 mg | ORAL_TABLET | Freq: Every day | ORAL | 5 refills | Status: AC | PRN
  Filled 2024-06-13 – 2024-06-27 (×2): qty 10, 10d supply, fill #0

## 2024-06-13 MED ORDER — SILODOSIN 8 MG PO CAPS
8.0000 mg | ORAL_CAPSULE | Freq: Every day | ORAL | 11 refills | Status: AC
  Filled 2024-06-13 – 2024-06-25 (×3): qty 30, 30d supply, fill #0
  Filled 2024-07-31: qty 30, 30d supply, fill #1
  Filled 2024-09-28 (×2): qty 30, 30d supply, fill #2

## 2024-06-13 MED ORDER — FINASTERIDE 5 MG PO TABS
5.0000 mg | ORAL_TABLET | Freq: Every day | ORAL | 3 refills | Status: AC
  Filled 2024-06-13 – 2024-07-31 (×2): qty 90, 90d supply, fill #0

## 2024-06-13 NOTE — Addendum Note (Signed)
 Addended by: Detric Scalisi L on: 06/13/2024 03:15 PM   Modules accepted: Orders

## 2024-06-13 NOTE — Progress Notes (Signed)
 06/13/2024 2:54 PM   Roy Reed 05-08-1956 [id number]  Referring provider: Vicci Barnie NOVAK, MD [address] [place],  [place] [address]  Followup prostate cancer   HPI: [name] is a 28bn here for followup for prostate cancer and BPH with a weak stream. PSA remains undetectable. IPSS 5 QOL 0 on rapaflo  8mg  daily and finasteride . Nocturia 1x. Urine stream strong. NO straining to urinate. No dysuria or hematuria.    PMH: Past Medical History:  Diagnosis Date   Diabetes mellitus without complication (HCC)    Elevated PSA    HLD (hyperlipidemia)    side effect from Tamsulosin    Left knee pain    Low back pain    Pre-diabetes    Prostate cancer (HCC)    Rotator cuff disorder, right    Rotator cuff tear arthropathy of right shoulder 08/31/2017   Sleep apnea     Surgical History: Past Surgical History:  Procedure Laterality Date   COLONOSCOPY     PROSTATE BIOPSY     PROSTATE BIOPSY     RADIOACTIVE SEED IMPLANT N/A 01/02/2018   Procedure: RADIOACTIVE SEED IMPLANT/BRACHYTHERAPY IMPLANT;  Surgeon: Sherrilee Belvie CROME, MD;  Location: [facility];  Service: Urology;  Laterality: N/A;   SHOULDER ARTHROSCOPY WITH ROTATOR CUFF REPAIR AND SUBACROMIAL DECOMPRESSION Right 08/05/2017   Procedure: RIGHT SHOULDER ARTHROSCOPY WITH ROTATOR CUFF REPAIR, DISTAL CLAVICLE EXCISION, DEBRIDEMENT AND SUBACROMIAL DECOMPRESSION;  Surgeon: Jerri Kay HERO, MD;  Location: [facility];  Service: Orthopedics;  Laterality: Right;   SPACE OAR INSTILLATION N/A 01/02/2018   Procedure: SPACE OAR INSTILLATION;  Surgeon: Sherrilee Belvie CROME, MD;  Location: [facility];  Service: Urology;  Laterality: N/A;    Home Medications:  Allergies as of 06/13/2024       Reactions   Aspirin    States he can take if he takes acid reducer medicine first. Not taking         Medication List        Accurate as of June 13, 2024  2:54 PM. If you have any  questions, ask your nurse or doctor.          atorvastatin  10 MG tablet Commonly known as: LIPITOR Take 1 tablet (10 mg total) by mouth daily.   finasteride  5 MG tablet Commonly known as: PROSCAR  Take 1 tablet (5 mg total) by mouth daily.   metFORMIN  500 MG tablet Commonly known as: GLUCOPHAGE  Take 0.5 tablets (250 mg total) by mouth daily with breakfast.   silodosin  8 MG Caps capsule Commonly known as: RAPAFLO  Take 1 capsule (8 mg total) by mouth daily with breakfast.   tadalafil  20 MG tablet Commonly known as: CIALIS  Take 1 tablet (20 mg total) by mouth daily as needed.        Allergies:  Allergies  Allergen Reactions   Aspirin     States he can take if he takes acid reducer medicine first. Not taking     Family History: Family History  Problem Relation Age of Onset   Colon cancer Neg Hx    Colon polyps Neg Hx    Esophageal cancer Neg Hx    Rectal cancer Neg Hx    Stomach cancer Neg Hx     Social History:  reports that he has never smoked. He has never used smokeless tobacco. He reports that he does not currently use alcohol. He reports that he does not use drugs.  ROS: All other review of  systems were reviewed and are negative except what is noted above in HPI  Physical Exam: BP 133/79   Pulse 80   Constitutional:  Alert and oriented, No acute distress. HEENT: [place] AT, moist mucus membranes.  Trachea midline, no masses. Cardiovascular: No clubbing, cyanosis, or edema. Respiratory: Normal respiratory effort, no increased work of breathing. GI: Abdomen is soft, nontender, nondistended, no abdominal masses GU: No CVA tenderness.  Lymph: No cervical or inguinal lymphadenopathy. Skin: No rashes, bruises or suspicious lesions. Neurologic: Grossly intact, no focal deficits, moving all 4 extremities. Psychiatric: Normal mood and affect.  Laboratory Data: Lab Results  Component Value Date   WBC 4.4 01/26/2024   HGB 13.8 01/26/2024   HCT 44.0 01/26/2024    MCV 85 01/26/2024   PLT 240 01/26/2024    Lab Results  Component Value Date   CREATININE 1.00 05/12/2023    No results found for: PSA  No results found for: TESTOSTERONE  Lab Results  Component Value Date   HGBA1C 6.3 01/26/2024    Urinalysis    Component Value Date/Time   COLORURINE STRAW (A) 02/24/2018 1611   APPEARANCEUR Clear 04/02/2022 1311   LABSPEC 1.004 (L) 02/24/2018 1611   PHURINE 7.0 02/24/2018 1611   GLUCOSEU Negative 04/02/2022 1311   HGBUR SMALL (A) 02/24/2018 1611   BILIRUBINUR Negative 04/02/2022 1311   KETONESUR NEGATIVE 02/24/2018 1611   PROTEINUR Negative 04/02/2022 1311   PROTEINUR NEGATIVE 02/24/2018 1611   UROBILINOGEN negative (A) 01/21/2020 1430   NITRITE Negative 04/02/2022 1311   NITRITE NEGATIVE 02/24/2018 1611   LEUKOCYTESUR Negative 04/02/2022 1311    Lab Results  Component Value Date   LABMICR See below: 04/02/2022   WBCUA None seen 04/02/2022   LABEPIT 0-10 04/02/2022   MUCUS Present 04/02/2022   BACTERIA None seen 04/02/2022    Pertinent Imaging:  No results found for this or any previous visit.  No results found for this or any previous visit.  No results found for this or any previous visit.  No results found for this or any previous visit.  No results found for this or any previous visit.  No results found for this or any previous visit.  No results found for this or any previous visit.  No results found for this or any previous visit.   Assessment & Plan:    1. Malignant neoplasm of prostate (HCC) (Primary) Followup 1 year with PSA - Urinalysis, Routine w reflex microscopic  2. Benign prostatic hyperplasia with urinary obstruction Continue rapaflo  8mg  and finasteride  5mg  daily  3. Weak urinary stream Continue rapaflo  8mg  daily.    No follow-ups on file.  Belvie Clara, MD  [facility] [place]

## 2024-06-13 NOTE — Patient Instructions (Signed)

## 2024-06-21 ENCOUNTER — Other Ambulatory Visit: Payer: Self-pay

## 2024-06-22 ENCOUNTER — Other Ambulatory Visit: Payer: Self-pay

## 2024-06-25 ENCOUNTER — Other Ambulatory Visit: Payer: Self-pay | Admitting: Internal Medicine

## 2024-06-25 ENCOUNTER — Other Ambulatory Visit: Payer: Self-pay

## 2024-06-25 ENCOUNTER — Other Ambulatory Visit (HOSPITAL_BASED_OUTPATIENT_CLINIC_OR_DEPARTMENT_OTHER): Payer: Self-pay

## 2024-06-25 DIAGNOSIS — R7303 Prediabetes: Secondary | ICD-10-CM

## 2024-06-26 ENCOUNTER — Other Ambulatory Visit: Payer: Self-pay

## 2024-06-26 MED ORDER — METFORMIN HCL 500 MG PO TABS
250.0000 mg | ORAL_TABLET | Freq: Every day | ORAL | 0 refills | Status: DC
  Filled 2024-06-26: qty 45, 90d supply, fill #0

## 2024-06-27 ENCOUNTER — Other Ambulatory Visit: Payer: Self-pay

## 2024-07-30 ENCOUNTER — Other Ambulatory Visit: Payer: Self-pay

## 2024-07-30 ENCOUNTER — Encounter: Payer: Self-pay | Admitting: Internal Medicine

## 2024-07-30 ENCOUNTER — Ambulatory Visit: Payer: Self-pay | Attending: Internal Medicine | Admitting: Internal Medicine

## 2024-07-30 VITALS — BP 116/72 | HR 84 | Ht 73.0 in | Wt 262.0 lb

## 2024-07-30 DIAGNOSIS — G4489 Other headache syndrome: Secondary | ICD-10-CM | POA: Insufficient documentation

## 2024-07-30 DIAGNOSIS — Z860100 Personal history of colon polyps, unspecified: Secondary | ICD-10-CM | POA: Insufficient documentation

## 2024-07-30 DIAGNOSIS — M25512 Pain in left shoulder: Secondary | ICD-10-CM | POA: Diagnosis not present

## 2024-07-30 DIAGNOSIS — M1712 Unilateral primary osteoarthritis, left knee: Secondary | ICD-10-CM | POA: Insufficient documentation

## 2024-07-30 DIAGNOSIS — Z9181 History of falling: Secondary | ICD-10-CM | POA: Insufficient documentation

## 2024-07-30 DIAGNOSIS — I2510 Atherosclerotic heart disease of native coronary artery without angina pectoris: Secondary | ICD-10-CM | POA: Diagnosis not present

## 2024-07-30 DIAGNOSIS — Z8546 Personal history of malignant neoplasm of prostate: Secondary | ICD-10-CM | POA: Diagnosis not present

## 2024-07-30 DIAGNOSIS — E782 Mixed hyperlipidemia: Secondary | ICD-10-CM | POA: Diagnosis not present

## 2024-07-30 DIAGNOSIS — M25562 Pain in left knee: Secondary | ICD-10-CM

## 2024-07-30 DIAGNOSIS — Z6834 Body mass index (BMI) 34.0-34.9, adult: Secondary | ICD-10-CM | POA: Diagnosis not present

## 2024-07-30 DIAGNOSIS — Z923 Personal history of irradiation: Secondary | ICD-10-CM | POA: Insufficient documentation

## 2024-07-30 DIAGNOSIS — R7303 Prediabetes: Secondary | ICD-10-CM | POA: Diagnosis present

## 2024-07-30 DIAGNOSIS — M25462 Effusion, left knee: Secondary | ICD-10-CM | POA: Diagnosis not present

## 2024-07-30 DIAGNOSIS — E669 Obesity, unspecified: Secondary | ICD-10-CM | POA: Diagnosis not present

## 2024-07-30 DIAGNOSIS — G4733 Obstructive sleep apnea (adult) (pediatric): Secondary | ICD-10-CM | POA: Insufficient documentation

## 2024-07-30 DIAGNOSIS — Z79899 Other long term (current) drug therapy: Secondary | ICD-10-CM | POA: Diagnosis not present

## 2024-07-30 DIAGNOSIS — E66811 Obesity, class 1: Secondary | ICD-10-CM | POA: Diagnosis not present

## 2024-07-30 DIAGNOSIS — Z7984 Long term (current) use of oral hypoglycemic drugs: Secondary | ICD-10-CM | POA: Insufficient documentation

## 2024-07-30 DIAGNOSIS — Z23 Encounter for immunization: Secondary | ICD-10-CM

## 2024-07-30 LAB — GLUCOSE, POCT (MANUAL RESULT ENTRY): POC Glucose: 111 mg/dL — AB (ref 70–99)

## 2024-07-30 LAB — POCT GLYCOSYLATED HEMOGLOBIN (HGB A1C): HbA1c, POC (prediabetic range): 6.2 % (ref 5.7–6.4)

## 2024-07-30 MED ORDER — DICLOFENAC SODIUM 1 % EX GEL
2.0000 g | Freq: Four times a day (QID) | CUTANEOUS | 2 refills | Status: AC
  Filled 2024-07-30: qty 100, 7d supply, fill #0

## 2024-07-30 MED ORDER — ATORVASTATIN CALCIUM 10 MG PO TABS
10.0000 mg | ORAL_TABLET | Freq: Every day | ORAL | 1 refills | Status: AC
  Filled 2024-07-30: qty 90, 90d supply, fill #0

## 2024-07-30 MED ORDER — METFORMIN HCL 500 MG PO TABS
250.0000 mg | ORAL_TABLET | Freq: Every day | ORAL | 1 refills | Status: AC
  Filled 2024-07-30 – 2024-09-28 (×2): qty 45, 90d supply, fill #0

## 2024-07-30 NOTE — Patient Instructions (Signed)
  VISIT SUMMARY: Today, you had a routine follow-up appointment. We discussed your prediabetes, weight, knee pain, sleep apnea, recent headaches, and general health maintenance. Your A1c level has slightly improved, but your weight has increased. You also mentioned knee pain affecting your physical activity and recent headaches after a fall.  YOUR PLAN: -PREDIABETES: Prediabetes means your blood sugar levels are higher than normal but not high enough to be classified as diabetes. Your A1c level is 6.2, which is a slight improvement. Continue taking metformin  500 mg, half a tablet daily, and be mindful of portion control during the holiday season.  -OBESITY, CLASS 1: Obesity can increase the risk of various health issues, including joint problems. Your weight has increased to 262 pounds. We discussed the importance of weight loss to alleviate joint strain and prevent arthritis progression.  -MIXED HYPERLIPIDEMIA: Mixed hyperlipidemia means you have high levels of different types of fats in your blood. Continue taking atorvastatin  as prescribed. We ordered blood tests to monitor your liver and kidney function.  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 You have persistent knee pain and swelling. We prescribed Voltaren  gel for pain relief and referred you to orthopedics for further evaluation. Weight loss can also help reduce joint strain.  -OBSTRUCTIVE SLEEP APNEA, ADULT, ON CPAP: Obstructive sleep apnea is a condition where your breathing stops and starts during sleep. Continue using your CPAP machine consistently throughout the night. Consider purchasing CPAP pillows for better comfort. We referred you to a sleep specialist for a potential pressure adjustment.  -MEDICATION-ASSOCIATED AND SLEEP-RELATED HEADACHES: Your headaches may be related to your  medication or sleep apnea. We changed the timing of your atorvastatin  and cilodosin to nighttime dosing and advised consistent CPAP use. We also referred you to a sleep specialist for further evaluation. Will also refer to neurologist.  INSTRUCTIONS: Please follow up with the orthopedics and sleep specialist as referred. Additionally, we have scheduled a Medicare wellness visit in four weeks for health screenings and vaccinations.                      Contains text generated by Abridge.                                 Contains text generated by Abridge.

## 2024-07-30 NOTE — Progress Notes (Signed)
 Patient ID: Roy Reed, male    DOB: Oct 20, 1955  MRN: [id number]  CC: Follow-up (Pre-diabetes f/u./Intermittent L knee pain & swelling - requesting referral to ortho /Hit head against wall 10 days ago/Discuss Cpap machine use /Intermittent pain on L shoulder from sleep positions /Yes to flu vax)   Subjective: Roy Reed is a 68 y.o. male who presents for chronic ds management. 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    AMN Language interpreter used during this encounter. #[id number]   Discussed the use of AI scribe software for clinical note transcription with the patient, who gave verbal consent to proceed.  History of Present Illness   Roy Reed is a 68 year old male who presents for a routine follow-up.  PreDM/obesity: He has prediabetes with a current A1c of 6.2, slightly improved from 6.3 six months ago. He continues to take metformin  500 mg, half a tablet daily. His weight has increased from 259 lbs to 262 lbs, which he attributes to recent travel to [place], [place], and [place]. He is attempting to improve his eating habits and has reduced his physical activity, particularly tennis, due to knee pain.  LT knee pain: He experiences intermittent pain and swelling in his left knee, which began in September after participating in a tennis tournament. The pain persists and affects his ability to walk and sit, leading him to stop playing tennis. He visited the [facility] two weeks ago for same, where imaging showed moderate joint effusion and mild tricompartment arthritis changes. He applies ice every two hours for pain relief but has not taken any medication.  OSA: He has sleep apnea and uses a CPAP machine. He inquires about the best sleeping position and mentions using different pillows during travel,  which sometimes affects his sleep quality. He continues to use the CPAP machine but sometimes removes it at night. He continues to have chronic frontal headaches which he associates when there is lack of sleep and also thinks some of his medications may be contributing.  He takes all 4 of his medications in the morning including Rapaflo , metformin , atorvastatin  and finasteride .  About 30 minutes after taking his medicines he develops the headache.  There is no associated blurred vision, nausea, vomiting, dizziness or light sensitivity.  The headaches sometimes persist throughout the day. He fell 10 days ago offered chair that he was sitting on.  The chair was about 2 feet high.  He hit the back of the head against the wall.  He has not had any pain in this area being the occipital area.  He has not had any increase intensity of headache since hitting his head against the wall.    HL: still taking the atorvastatin  consistently   Patient Active Problem List   Diagnosis Date Noted   OSA (obstructive sleep apnea) 12/27/2022   Benign prostatic hyperplasia with urinary obstruction 01/21/2020   Normocytic anemia 11/30/2019   Elevated blood pressure reading 11/30/2019   Dyspnea on exertion 11/30/2019   Prediabetes 11/30/2019   Hyperlipidemia 08/15/2018   Obesity (BMI 30-39.9) 08/15/2018   Skin tag 08/15/2018   Malignant neoplasm of prostate (HCC) 10/13/2017   Screening for malignant neoplasm of colon 08/31/2017   Personal history of gastric ulcer 02/09/2017     Current Outpatient Medications on File Prior to Visit  Medication Sig Dispense Refill   finasteride  (PROSCAR ) 5 MG tablet Take 1 tablet (5 mg total) by mouth daily. 90 tablet 3   silodosin  (RAPAFLO ) 8 MG CAPS capsule Take 1 capsule (8 mg total) by mouth daily with breakfast. 30 capsule 11   sildenafil  (VIAGRA ) 100 MG tablet Take 1 tablet (100 mg total) by mouth daily as needed for erectile dysfunction. (Patient not taking: Reported on  07/30/2024) 10 tablet 5   No current facility-administered medications on file prior to visit.    Allergies  Allergen Reactions   Aspirin     States he can take if he takes acid reducer medicine first. Not taking     Social History   Socioeconomic History   Marital status: Married    Spouse name: Not on file   Number of children: 6   Years of education: Not on file   Highest education level: Not on file  Occupational History   Occupation: retired  Tobacco Use   Smoking status: Never   Smokeless tobacco: Never  Vaping Use   Vaping status: Never Used  Substance and Sexual Activity   Alcohol use: Not Currently   Drug use: No   Sexual activity: Not on file  Other Topics Concern   Not on file  Social History Narrative   Not on file   Social Drivers of Health   Financial Resource Strain: Low Risk  (07/30/2024)   Overall Financial Resource Strain (CARDIA)    Difficulty of Paying Living Expenses: Not hard at all  Food Insecurity: No Food Insecurity (07/30/2024)   Hunger Vital Sign    Worried About Running Out of Food in the Last Year: Never true    Ran Out of Food in the Last Year: Never true  Transportation Needs: No Transportation Needs (07/30/2024)   PRAPARE - Administrator, Civil Service (Medical): No    Lack of Transportation (Non-Medical): No  Physical Activity: Insufficiently Active (07/30/2024)   Exercise Vital Sign    Days of Exercise per Week: 3 days    Minutes of Exercise per Session: 30 min  Stress: No Stress Concern Present (07/30/2024)   [employer] of Occupational Health - Occupational Stress Questionnaire    Feeling of Stress: Not at all  Social Connections: Socially Integrated (07/30/2024)   Social Connection and Isolation Panel    Frequency of Communication with Friends and Family: More than three times a week    Frequency of Social Gatherings with Friends and Family: More than three times a week    Attends Religious Services:  More than 4 times per year    Active Member of [employer] or Organizations: Yes    Attends Banker Meetings: 1 to 4 times per year    Marital Status: Married  Catering Manager Violence: Not At Risk (07/30/2024)   Humiliation, Afraid, Rape, and Kick questionnaire    Fear of Current or Ex-Partner: No    Emotionally Abused: No    Physically Abused: No    Sexually Abused: No    Family History  Problem Relation Age of Onset   Colon cancer Neg Hx    Colon polyps Neg Hx    Esophageal cancer Neg Hx    Rectal cancer Neg Hx    Stomach cancer Neg Hx     Past Surgical History:  Procedure Laterality Date   COLONOSCOPY     PROSTATE BIOPSY     PROSTATE BIOPSY     RADIOACTIVE SEED IMPLANT N/A  01/02/2018   Procedure: RADIOACTIVE SEED IMPLANT/BRACHYTHERAPY IMPLANT;  Surgeon: Sherrilee Belvie CROME, MD;  Location[facility];  Service: Urology;  Laterality: N/A;   SHOULDER ARTHROSCOPY WITH ROTATOR CUFF REPAIR AND SUBACROMIAL DECOMPRESSION Right 08/05/2017   Procedure: RIGHT SHOULDER ARTHROSCOPY WITH ROTATOR CUFF REPAIR, DISTAL CLAVICLE EXCISION, DEBRIDEMENT AND SUBACROMIAL DECOMPRESSION;  Surgeon: Jerri Kay HERO, MD;  Location: [facility];  Service: Orthopedics;  Laterality: Right;   SPACE OAR INSTILLATION N/A 01/02/2018   Procedure: SPACE OAR INSTILLATION;  Surgeon: Sherrilee Belvie CROME, MD;  Location: [facility];  Service: Urology;  Laterality: N/A;    ROS: Review of Systems Negative except as stated above  PHYSICAL EXAM: BP 116/72 (BP Location: Left Arm, Patient Position: Sitting, Cuff Size: Large)   Pulse 84   Ht 6' 1 (1.854 m)   Wt 262 lb (118.8 kg)   SpO2 95%   BMI 34.57 kg/m   Wt Readings from Last 3 Encounters:  07/30/24 262 lb (118.8 kg)  01/26/24 259 lb (117.5 kg)  09/12/23 263 lb (119.3 kg)    Physical Exam   General appearance - alert, well appearing, and in no distress Mental status - normal mood, behavior, speech,  dress, motor activity, and thought processes Neck - supple, no significant adenopathy Chest - clear to auscultation, no wheezes, rales or rhonchi, symmetric air entry Heart - normal rate, regular rhythm, normal S1, S2, no murmurs, rubs, clicks or gallops Neurological - cranial nerves II through XII intact, motor and sensory grossly normal bilaterally Extremities - peripheral pulses normal, no pedal edema, no clubbing or cyanosis MSK: Left knee: Joint is enlarged and has mild soft tissue swelling.  No point tenderness on palpation of the medial and lateral joint lines.  He has moderate crepitus mild discomfort with passive range of motion.  Right knee: No point tenderness.  He has mild crepitus on passive range of motion.  No noted discomfort on passive range of motion.  Results for orders placed or performed in visit on 07/30/24  POCT glucose (manual entry)   Collection Time: 07/30/24  9:59 AM  Result Value Ref Range   POC Glucose 111 (A) 70 - 99 mg/dl  POCT glycosylated hemoglobin (Hb A1C)   Collection Time: 07/30/24 10:00 AM  Result Value Ref Range   Hemoglobin A1C     HbA1c POC (<> result, manual entry)     HbA1c, POC (prediabetic range) 6.2 5.7 - 6.4 %   HbA1c, POC (controlled diabetic range)         Latest Ref Rng & Units 05/12/2023    2:31 PM 03/05/2022    4:36 PM 04/22/2021    2:58 PM  CMP  Glucose 70 - 99 mg/dL 895  80  77   BUN 8 - 27 mg/dL 14  13  13    Creatinine 0.76 - 1.27 mg/dL 8.99  8.78  8.88   Sodium 134 - 144 mmol/L 141  138  140   Potassium 3.5 - 5.2 mmol/L 4.5  4.3  4.7   Chloride 96 - 106 mmol/L 100  100  100   CO2 20 - 29 mmol/L 26  28  26    Calcium  8.6 - 10.2 mg/dL 9.7  9.9  9.2   Total Protein 6.0 - 8.5 g/dL 7.4  7.6  7.6   Total Bilirubin 0.0 - 1.2 mg/dL 0.2  0.3  0.4   Alkaline Phos 44 - 121 IU/L 84  79  91   AST 0 -  40 IU/L 25  36  22   ALT 0 - 44 IU/L 45  63  36    Lipid Panel     Component Value Date/Time   CHOL 164 11/09/2022 0907   TRIG 65  11/09/2022 0907   HDL 51 11/09/2022 0907   CHOLHDL 3.2 11/09/2022 0907   LDLCALC 100 (H) 11/09/2022 0907    CBC    Component Value Date/Time   WBC 4.4 01/26/2024 1032   WBC 5.2 12/26/2017 0853   RBC 5.20 01/26/2024 1032   RBC 4.77 12/26/2017 0853   HGB 13.8 01/26/2024 1032   HCT 44.0 01/26/2024 1032   PLT 240 01/26/2024 1032   MCV 85 01/26/2024 1032   MCH 26.5 (L) 01/26/2024 1032   MCH 27.0 12/26/2017 0853   MCHC 31.4 (L) 01/26/2024 1032   MCHC 32.7 12/26/2017 0853   RDW 12.6 01/26/2024 1032   LYMPHSABS 2.6 02/28/2017 1651   EOSABS 0.0 02/28/2017 1651   BASOSABS 0.0 02/28/2017 1651    ASSESSMENT AND PLAN: 1. Mixed hyperlipidemia (Primary) Continue atorvastatin . - atorvastatin  (LIPITOR) 10 MG tablet; Take 1 tablet (10 mg total) by mouth daily.  Dispense: 90 tablet; Refill: 1 - Comprehensive metabolic panel with GFR  2. Obesity (BMI 30.0-34.9) Encourage patient to get back on track with healthier eating habits.  He has gained some weight from overindulgence during his recent international travels. - metFORMIN  (GLUCOPHAGE ) 500 MG tablet; Take 0.5 tablets (250 mg total) by mouth daily with breakfast.  Dispense: 45 tablet; Refill: 1 - Comprehensive metabolic panel with GFR  3. Prediabetes See #2 above.  Continue metformin . - metFORMIN  (GLUCOPHAGE ) 500 MG tablet; Take 0.5 tablets (250 mg total) by mouth daily with breakfast.  Dispense: 45 tablet; Refill: 1 - POCT glucose (manual entry) - POCT glycosylated hemoglobin (Hb A1C)  4. Acute pain of left knee With underlying osteoarthritis on imaging studies done through [facility].  He was also noted to have a moderate effusion on that imaging study.  Will refer him to orthopedics.  Will prescribe anti-inflammatory.  Patient prefers topical. - AMB referral to orthopedics - diclofenac  Sodium (VOLTAREN  ARTHRITIS PAIN) 1 % GEL; Apply 2 g topically 4 (four) times daily.  Dispense: 100 g; Refill: 2  5. Headache  syndrome Chronic and may be due to OSA that may not be adequately controlled, versus medication versus other underlying headache syndrome Advised patient to start taking the atorvastatin  and Rapaflo  in the evenings and the metformin  and Proscar  in the mornings to help tease out which medicine may be causing or contributing headache for him. -Will also get him in with the sleep specialist to make sure his pressures are adequate.  Encouraged him to use the CPAP machine consistently. - Ambulatory referral to Neurology  6. OSA on CPAP See #5 above. Advised patient that he can explore [employer] for pillows that can be used for better comfort for people who are on CPAP machine.  7. Need for influenza vaccination We are out of the high dose flu vaccine. Advised to get from our pharmacy downstairs      Patient was given the opportunity to ask questions.  Patient verbalized understanding of the plan and was able to repeat key elements of the plan.   This documentation was completed using Paediatric nurse.  Any transcriptional errors are unintentional.  Orders Placed This Encounter  Procedures   Flu vaccine HIGH DOSE PF(Fluzone Trivalent)   Comprehensive metabolic panel with GFR   AMB referral  to orthopedics   Ambulatory referral to Neurology   Ambulatory referral to Pulmonology   POCT glucose (manual entry)   POCT glycosylated hemoglobin (Hb A1C)     Requested Prescriptions   Signed Prescriptions Disp Refills   atorvastatin  (LIPITOR) 10 MG tablet 90 tablet 1    Sig: Take 1 tablet (10 mg total) by mouth daily.   metFORMIN  (GLUCOPHAGE ) 500 MG tablet 45 tablet 1    Sig: Take 0.5 tablets (250 mg total) by mouth daily with breakfast.   diclofenac  Sodium (VOLTAREN  ARTHRITIS PAIN) 1 % GEL 100 g 2    Sig: Apply 2 g topically 4 (four) times daily.    Return in about 4 weeks (around 08/27/2024) for for Welcome to Medicare Visit. SABRA Barnie Louder, MD, FACP

## 2024-07-31 ENCOUNTER — Other Ambulatory Visit: Payer: Self-pay

## 2024-07-31 ENCOUNTER — Ambulatory Visit: Payer: Self-pay | Admitting: Internal Medicine

## 2024-07-31 ENCOUNTER — Encounter: Payer: Self-pay | Admitting: Neurology

## 2024-07-31 LAB — COMPREHENSIVE METABOLIC PANEL WITH GFR
ALT: 37 IU/L (ref 0–44)
AST: 24 IU/L (ref 0–40)
Albumin: 4.3 g/dL (ref 3.9–4.9)
Alkaline Phosphatase: 83 IU/L (ref 47–123)
BUN/Creatinine Ratio: 16 (ref 10–24)
BUN: 17 mg/dL (ref 8–27)
Bilirubin Total: 0.4 mg/dL (ref 0.0–1.2)
CO2: 26 mmol/L (ref 20–29)
Calcium: 10.4 mg/dL — ABNORMAL HIGH (ref 8.6–10.2)
Chloride: 99 mmol/L (ref 96–106)
Creatinine, Ser: 1.09 mg/dL (ref 0.76–1.27)
Globulin, Total: 3.5 g/dL (ref 1.5–4.5)
Glucose: 81 mg/dL (ref 70–99)
Potassium: 4.9 mmol/L (ref 3.5–5.2)
Sodium: 139 mmol/L (ref 134–144)
Total Protein: 7.8 g/dL (ref 6.0–8.5)
eGFR: 73 mL/min/1.73 (ref >59)

## 2024-08-01 ENCOUNTER — Ambulatory Visit (INDEPENDENT_AMBULATORY_CARE_PROVIDER_SITE_OTHER): Admitting: Primary Care

## 2024-08-01 ENCOUNTER — Other Ambulatory Visit: Payer: Self-pay

## 2024-08-01 ENCOUNTER — Encounter: Payer: Self-pay | Admitting: Primary Care

## 2024-08-01 VITALS — BP 144/79 | HR 92 | Temp 98.2°F | Ht 73.0 in | Wt 261.2 lb

## 2024-08-01 DIAGNOSIS — G4733 Obstructive sleep apnea (adult) (pediatric): Secondary | ICD-10-CM | POA: Diagnosis not present

## 2024-08-01 MED ORDER — FLUTICASONE PROPIONATE 50 MCG/ACT NA SUSP
1.0000 | Freq: Every day | NASAL | 1 refills | Status: AC
  Filled 2024-08-01: qty 16, 30d supply, fill #0

## 2024-08-01 NOTE — Patient Instructions (Addendum)
  VISIT SUMMARY: You came in today to discuss your sleep management. You have been using a CPAP machine since April 2024 for moderate sleep apnea but have been experiencing morning headaches. We discussed potential causes and solutions for these headaches, including mask fit and pressure settings.  YOUR PLAN: -OBSTRUCTIVE SLEEP APNEA WITH CPAP-RELATED HEADACHE: Obstructive sleep apnea is a condition where your airway becomes blocked during sleep, causing breathing pauses. Your morning headaches may be related to the CPAP machine's mask fit or pressure settings. We will refer you for a titration study to determine the optimal CPAP pressure and mask fit. Additionally, using a wedge pillow for back sleeping or a CPAP pillow for side sleeping may help improve mask fit. We also provided you with the contact information for a medical supply store to enroll in the [employer] program and download your CPAP data.  Nasal congestion Try Flonase  (fluticasone ) before bedtime (prescription sent to pharmacy)   Orders: CPAP titration study  Recommendations: Please bring CPAP mahcine by [employer] which is a medical supply and ask them to enroll you in airview - their phone number is [phone] and ask to set up appointment to bring CPAP machine by for compliance download and ask them to fax it to our office [phone] Attn: Almarie Ferrari  INSTRUCTIONS: Please follow up in approximately three months. In the meantime, use the recommended pillows to improve mask fit and contact the medical supply store to enroll in the [employer] program.  Follow-up 2-3 months with Beth NP   ____________________________________________________________________________________________ EINO DE LA CONSULTATION :  Vous tes venu(e) aujourd'hui pour discuter de la gestion de votre sommeil. Vous utilisez un appareil CPAP depuis avril 2024 pour assurant sommeil modre, mais vous souffrez de maux de tte matinaux. Nous avons  discut des causes potentielles et des solutions pour d[employer] de tte, notamment l'ajustement du masque et les rglages de pression.  VOTRE PLAN :  - APNE OBSTRUCTIVE DU SOMMEIL AVEC MAUX DE TTE LIS  LA CPAP : L'apne obstructive du sommeil est une affection dans laquelle les voies respiratoires se bloquent pendant le sommeil, provoquant des pauses respiratoires. Vos maux de tte mirant tre lis  l'ajustement du masque ou aux rglages de pression de l'appareil CPAP. Nous vous orienterons vers une tude de titration afin de dterminer la pression CPAP et l'ajustement du masque optimaux. De plus, l'utilisation teaching laboratory technician pour j. c. penney ou d'un oreiller CPAP pour dormir sur le ct peut contribuer  marketing executive. Nous vous avons galement fourni les coordonnes d'un magasin de matriel mdical pour vous inscrire au programme [employer] et tlcharger vos donnes CPAP.  Congestion nasale  Jeffery Flonase  (fluticasone ) avant le coucher (ordonnance envoye  la pharmacie)  Prescriptions :  tude de titration CPAP  Recommandations :  Administrator, arts votre appareil CPAP (fourni par [employer], fournisseur de engineer, maintenance) et financial risk analyst de vous inscrire au programme [employer]view. Leur numro est le [phone]. Prenez rendez-vous pour apporter votre appareil CPAP afin de estate manager/land agent de conformit et demandez-leur de les faxer  [employer] [phone] ( l'attention d'Agustine Rossitto Ellicott).  Instructions :  Berenda revenir dans environ trois mois. En attendant, utilisez les oreillers recommands pour [employer] masque et comptroller magasin de matriel mdical pour vous inscrire au programme Airview.  Suivi : 2  3 mois [employer], infirmire praticienne

## 2024-08-01 NOTE — Progress Notes (Signed)
 @Patient  ID: Roy Reed, male    DOB: 03-20-1956, 68 y.o.   MRN: [id number]  Chief Complaint  Patient presents with   Consult    Sleep consult    Referring provider: Vicci Barnie NOVAK, MD  HPI: Discussed the use of AI scribe software for clinical note transcription with the patient, who gave verbal consent to proceed.  History of Present Illness Roy Reed is a 68 year old male who presents for sleep management.  He underwent a sleep study in April 2024 at [facility] which revealed moderate sleep apnea. He received a CPAP machine from the same location but is unsure of the medical equipment company that provided the CPAP and supplies.  He uses the CPAP every night but experiences morning headaches. He is uncertain if these headaches are related to CPAP use or his sleeping position. He has experimented with different sleeping positions to improve mask fit, noting that the mask sometimes feels improperly adjusted, especially when sleeping on his side. He uses a mask that covers both the nose and mouth.  He typically goes to bed around 11 PM and falls asleep within 10-15 minutes. He wakes up with headaches in the morning. He is trying different pillow options to enhance comfort while using the CPAP.  No symptoms of snoring, waking up gasping, or choking. The CPAP does not cause a sensation of suffocation, but he feels the mask is not well adjusted.   Allergies  Allergen Reactions   Aspirin     States he can take if he takes acid reducer medicine first. Not taking     Immunization History  Administered Date(s) Administered   Fluad Trivalent(High Dose 65+) 05/12/2023   Influenza,inj,Quad PF,6+ Mos 08/31/2017, 06/29/2018, 06/29/2019, 09/01/2021   PFIZER(Purple Top)SARS-COV-2 Vaccination 03/04/2020, 03/28/2020, 10/02/2020   Pneumococcal Conjugate-13 08/06/2019   Pneumococcal Polysaccharide-23 09/01/2021   Tdap 08/06/2019   Zoster Recombinant(Shingrix ) 12/27/2022,  09/12/2023    Past Medical History:  Diagnosis Date   Diabetes mellitus without complication (HCC)    Elevated PSA    HLD (hyperlipidemia)    side effect from Tamsulosin    Left knee pain    Low back pain    Pre-diabetes    Prostate cancer (HCC)    Rotator cuff disorder, right    Rotator cuff tear arthropathy of right shoulder 08/31/2017   Sleep apnea     Tobacco History: Social History   Tobacco Use  Smoking Status Never  Smokeless Tobacco Never   Counseling given: Not Answered   Outpatient Medications Prior to Visit  Medication Sig Dispense Refill   atorvastatin  (LIPITOR) 10 MG tablet Take 1 tablet (10 mg total) by mouth daily. 90 tablet 1   diclofenac  Sodium (VOLTAREN  ARTHRITIS PAIN) 1 % GEL Apply 2 g topically 4 (four) times daily. 100 g 2   finasteride  (PROSCAR ) 5 MG tablet Take 1 tablet (5 mg total) by mouth daily. 90 tablet 3   metFORMIN  (GLUCOPHAGE ) 500 MG tablet Take 0.5 tablets (250 mg total) by mouth daily with breakfast. 45 tablet 1   sildenafil  (VIAGRA ) 100 MG tablet Take 1 tablet (100 mg total) by mouth daily as needed for erectile dysfunction. 10 tablet 5   silodosin  (RAPAFLO ) 8 MG CAPS capsule Take 1 capsule (8 mg total) by mouth daily with breakfast. 30 capsule 11   No facility-administered medications prior to visit.    Review of Systems  Review of Systems  Constitutional:  Positive for fatigue.  Respiratory: Negative.    Neurological:  Positive for headaches.   Physical Exam  BP (!) 144/79   Pulse 92   Temp 98.2 F (36.8 C) (Oral)   Ht 6' 1 (1.854 m)   Wt 261 lb 3.2 oz (118.5 kg)   SpO2 98%   BMI 34.46 kg/m  Physical Exam Constitutional:      Appearance: Normal appearance. He is well-developed.  HENT:     Head: Normocephalic and atraumatic.     Mouth/Throat:     Mouth: Mucous membranes are moist.     Pharynx: Oropharynx is clear.  Cardiovascular:     Rate and Rhythm: Normal rate and regular rhythm.     Heart sounds: Normal heart  sounds.  Pulmonary:     Effort: Pulmonary effort is normal. No respiratory distress.     Breath sounds: Normal breath sounds. No wheezing or rhonchi.  Musculoskeletal:        General: Normal range of motion.     Cervical back: Normal range of motion and neck supple.  Skin:    General: Skin is warm and dry.     Findings: No erythema or rash.  Neurological:     General: No focal deficit present.     Mental Status: He is alert and oriented to person, place, and time. Mental status is at baseline.  Psychiatric:        Mood and Affect: Mood normal.        Behavior: Behavior normal.        Thought Content: Thought content normal.        Judgment: Judgment normal.     Lab Results:  CBC    Component Value Date/Time   WBC 4.4 01/26/2024 1032   WBC 5.2 12/26/2017 0853   RBC 5.20 01/26/2024 1032   RBC 4.77 12/26/2017 0853   HGB 13.8 01/26/2024 1032   HCT 44.0 01/26/2024 1032   PLT 240 01/26/2024 1032   MCV 85 01/26/2024 1032   MCH 26.5 (L) 01/26/2024 1032   MCH 27.0 12/26/2017 0853   MCHC 31.4 (L) 01/26/2024 1032   MCHC 32.7 12/26/2017 0853   RDW 12.6 01/26/2024 1032   LYMPHSABS 2.6 02/28/2017 1651   EOSABS 0.0 02/28/2017 1651   BASOSABS 0.0 02/28/2017 1651    BMET    Component Value Date/Time   NA 139 07/30/2024 1111   K 4.9 07/30/2024 1111   CL 99 07/30/2024 1111   CO2 26 07/30/2024 1111   GLUCOSE 81 07/30/2024 1111   GLUCOSE 98 12/26/2017 0853   BUN 17 07/30/2024 1111   CREATININE 1.09 07/30/2024 1111   CALCIUM  10.4 (H) 07/30/2024 1111   GFRNONAA 72 12/07/2019 1223   GFRAA 84 12/07/2019 1223    BNP    Component Value Date/Time   BNP <2.5 01/26/2024 1032    ProBNP No results found for: PROBNP  Imaging: No results found.   Assessment & Plan:    1. OSA (obstructive sleep apnea) (Primary) - Ambulatory Referral for DME  Assessment and Plan Assessment & Plan Obstructive sleep apnea treated with CPAP Moderate obstructive sleep apnea diagnosed in  April 2024. Reports headaches upon waking, potentially related to CPAP use. Headaches may be due to mask fit or pressure settings.No current data from CPAP machine to assess compliance or pressure settings. - Referred for titration study to determine optimal CPAP pressure and mask fit. - Recommended use of wedge pillow for back sleepers or CPAP pillow for side sleepers to improve mask fit. - Provided address and phone number for  medical supply store to enroll in [place] program and download CPAP data. - Scheduled follow-up appointment in approximately three months.    Almarie LELON Ferrari, NP 08/01/2024

## 2024-08-10 ENCOUNTER — Ambulatory Visit: Admitting: Orthopaedic Surgery

## 2024-08-10 ENCOUNTER — Other Ambulatory Visit: Payer: Self-pay

## 2024-08-10 DIAGNOSIS — M25562 Pain in left knee: Secondary | ICD-10-CM

## 2024-08-10 DIAGNOSIS — G8929 Other chronic pain: Secondary | ICD-10-CM

## 2024-08-10 MED ORDER — METHYLPREDNISOLONE ACETATE 40 MG/ML IJ SUSP
40.0000 mg | INTRAMUSCULAR | Status: AC | PRN
  Administered 2024-08-10: 40 mg via INTRA_ARTICULAR

## 2024-08-10 MED ORDER — LIDOCAINE HCL 1 % IJ SOLN
2.0000 mL | INTRAMUSCULAR | Status: AC | PRN
  Administered 2024-08-10: 2 mL

## 2024-08-10 MED ORDER — BUPIVACAINE HCL 0.5 % IJ SOLN
2.0000 mL | INTRAMUSCULAR | Status: AC | PRN
  Administered 2024-08-10: 2 mL via INTRA_ARTICULAR

## 2024-08-10 NOTE — Progress Notes (Signed)
 Office Visit Note   Patient: Roy Reed           Date of Birth: 01/09/1956           MRN: [id number] Visit Date: 08/10/2024              Requested by: Vicci Barnie NOVAK, MD [address] [place] [address] [place],  [place] [address] PCP: Vicci Barnie NOVAK, MD   Assessment & Plan: Visit Diagnoses:  1. Chronic pain of left knee     Plan: Impression is osteoarthritis flare versus medial meniscus tear.  Treatment options discussed that he would like to try cortisone injection.  Will modify activity as needed.  Follow-up if symptoms do not improve after 6 to 8 weeks.  Language barrier ncreased the complexity of the visit.  Follow-Up Instructions: No follow-ups on file.   Orders:  Orders Placed This Encounter  Procedures   XR KNEE 3 VIEW LEFT   No orders of the defined types were placed in this encounter.     Procedures: Large Joint Inj: L knee on 08/10/2024 4:40 PM Details: 22 G needle Medications: 2 mL bupivacaine  0.5 %; 2 mL lidocaine  1 %; 40 mg methylPREDNISolone  acetate 40 MG/ML Outcome: tolerated well, no immediate complications Patient was prepped and draped in the usual sterile fashion.       Clinical Data: No additional findings.   Subjective: Chief Complaint  Patient presents with   Left Knee - Pain    HPI Patient is a 68 year old gentleman well-known to me comes back for left knee pain for 2 months.  Started swelling and hurting after playing tennis while he was in [place].  Interpreter present today.  He feels pain on the medial side of the knee.  He has been limping occasionally.  Denies any mechanical symptoms. Review of Systems  Constitutional: Negative.   HENT: Negative.    Eyes: Negative.   Respiratory: Negative.    Cardiovascular: Negative.   Gastrointestinal: Negative.   Endocrine: Negative.   Genitourinary: Negative.   Skin: Negative.   Allergic/Immunologic: Negative.   Neurological: Negative.   Hematological: Negative.    Psychiatric/Behavioral: Negative.    All other systems reviewed and are negative.    Objective: Vital Signs: There were no vitals taken for this visit.  Physical Exam Vitals and nursing note reviewed.  Constitutional:      Appearance: He is well-developed.  HENT:     Head: Normocephalic and atraumatic.  Eyes:     Pupils: Pupils are equal, round, and reactive to light.  Pulmonary:     Effort: Pulmonary effort is normal.  Abdominal:     Palpations: Abdomen is soft.  Musculoskeletal:        General: Normal range of motion.     Cervical back: Neck supple.  Skin:    General: Skin is warm.  Neurological:     Mental Status: He is alert and oriented to person, place, and time.  Psychiatric:        Behavior: Behavior normal.        Thought Content: Thought content normal.        Judgment: Judgment normal.     Ortho Exam Exam of the left knee shows no effusion.  Medial joint line tenderness.  Collaterals and cruciates are stable.  Normal range of motion.  Normal strength. Specialty Comments:  No specialty comments available.  Imaging: XR KNEE 3 VIEW LEFT Result Date: 08/10/2024 X-rays of the left knee show mild arthritic changes  and spurring.  Preserved joint spaces.    PMFS History: Patient Active Problem List   Diagnosis Date Noted   OSA (obstructive sleep apnea) 12/27/2022   Benign prostatic hyperplasia with urinary obstruction 01/21/2020   Normocytic anemia 11/30/2019   Elevated blood pressure reading 11/30/2019   Dyspnea on exertion 11/30/2019   Prediabetes 11/30/2019   Hyperlipidemia 08/15/2018   Obesity (BMI 30-39.9) 08/15/2018   Skin tag 08/15/2018   Malignant neoplasm of prostate (HCC) 10/13/2017   Screening for malignant neoplasm of colon 08/31/2017   Personal history of gastric ulcer 02/09/2017   Past Medical History:  Diagnosis Date   Diabetes mellitus without complication (HCC)    Elevated PSA    HLD (hyperlipidemia)    side effect from  Tamsulosin    Left knee pain    Low back pain    Pre-diabetes    Prostate cancer (HCC)    Rotator cuff disorder, right    Rotator cuff tear arthropathy of right shoulder 08/31/2017   Sleep apnea     Family History  Problem Relation Age of Onset   Colon cancer Neg Hx    Colon polyps Neg Hx    Esophageal cancer Neg Hx    Rectal cancer Neg Hx    Stomach cancer Neg Hx     Past Surgical History:  Procedure Laterality Date   COLONOSCOPY     PROSTATE BIOPSY     PROSTATE BIOPSY     RADIOACTIVE SEED IMPLANT N/A 01/02/2018   Procedure: RADIOACTIVE SEED IMPLANT/BRACHYTHERAPY IMPLANT;  Surgeon: Sherrilee Belvie CROME, MD;  Location: [facility];  Service: Urology;  Laterality: N/A;   SHOULDER ARTHROSCOPY WITH ROTATOR CUFF REPAIR AND SUBACROMIAL DECOMPRESSION Right 08/05/2017   Procedure: RIGHT SHOULDER ARTHROSCOPY WITH ROTATOR CUFF REPAIR, DISTAL CLAVICLE EXCISION, DEBRIDEMENT AND SUBACROMIAL DECOMPRESSION;  Surgeon: Jerri Kay HERO, MD;  Location: [facility];  Service: Orthopedics;  Laterality: Right;   SPACE OAR INSTILLATION N/A 01/02/2018   Procedure: SPACE OAR INSTILLATION;  Surgeon: Sherrilee Belvie CROME, MD;  Location: [facility];  Service: Urology;  Laterality: N/A;   Social History   Occupational History   Occupation: retired  Tobacco Use   Smoking status: Never   Smokeless tobacco: Never  Vaping Use   Vaping status: Never Used  Substance and Sexual Activity   Alcohol use: Not Currently   Drug use: No   Sexual activity: Not on file

## 2024-09-28 ENCOUNTER — Other Ambulatory Visit: Payer: Self-pay

## 2024-09-28 ENCOUNTER — Ambulatory Visit: Attending: Internal Medicine | Admitting: Internal Medicine

## 2024-09-28 ENCOUNTER — Encounter: Payer: Self-pay | Admitting: Internal Medicine

## 2024-09-28 VITALS — BP 121/77 | HR 79 | Temp 97.9°F | Ht 73.0 in | Wt 264.0 lb

## 2024-09-28 DIAGNOSIS — R7303 Prediabetes: Secondary | ICD-10-CM | POA: Insufficient documentation

## 2024-09-28 DIAGNOSIS — Z7189 Other specified counseling: Secondary | ICD-10-CM

## 2024-09-28 DIAGNOSIS — G4489 Other headache syndrome: Secondary | ICD-10-CM | POA: Insufficient documentation

## 2024-09-28 DIAGNOSIS — Z860100 Personal history of colon polyps, unspecified: Secondary | ICD-10-CM | POA: Diagnosis not present

## 2024-09-28 DIAGNOSIS — H04213 Epiphora due to excess lacrimation, bilateral lacrimal glands: Secondary | ICD-10-CM | POA: Diagnosis not present

## 2024-09-28 DIAGNOSIS — M5412 Radiculopathy, cervical region: Secondary | ICD-10-CM | POA: Diagnosis not present

## 2024-09-28 DIAGNOSIS — H04203 Unspecified epiphora, bilateral lacrimal glands: Secondary | ICD-10-CM | POA: Diagnosis not present

## 2024-09-28 DIAGNOSIS — Z6834 Body mass index (BMI) 34.0-34.9, adult: Secondary | ICD-10-CM | POA: Diagnosis not present

## 2024-09-28 DIAGNOSIS — I2510 Atherosclerotic heart disease of native coronary artery without angina pectoris: Secondary | ICD-10-CM | POA: Insufficient documentation

## 2024-09-28 DIAGNOSIS — E669 Obesity, unspecified: Secondary | ICD-10-CM | POA: Insufficient documentation

## 2024-09-28 DIAGNOSIS — E785 Hyperlipidemia, unspecified: Secondary | ICD-10-CM | POA: Diagnosis not present

## 2024-09-28 DIAGNOSIS — Z8546 Personal history of malignant neoplasm of prostate: Secondary | ICD-10-CM | POA: Insufficient documentation

## 2024-09-28 DIAGNOSIS — Z7984 Long term (current) use of oral hypoglycemic drugs: Secondary | ICD-10-CM | POA: Diagnosis not present

## 2024-09-28 DIAGNOSIS — Z79899 Other long term (current) drug therapy: Secondary | ICD-10-CM | POA: Diagnosis not present

## 2024-09-28 DIAGNOSIS — Z2821 Immunization not carried out because of patient refusal: Secondary | ICD-10-CM

## 2024-09-28 DIAGNOSIS — Z0000 Encounter for general adult medical examination without abnormal findings: Secondary | ICD-10-CM | POA: Insufficient documentation

## 2024-09-28 MED ORDER — TIZANIDINE HCL 2 MG PO TABS
2.0000 mg | ORAL_TABLET | Freq: Every day | ORAL | 1 refills | Status: AC | PRN
  Filled 2024-09-28: qty 30, 30d supply, fill #0

## 2024-09-28 NOTE — Progress Notes (Signed)
 "  Chief Complaint  Patient presents with   Welcome to [facility]    Welcome to [facility].  Reports eye redness in the a.m., headaches, tears, eye crust X2 mo LT neck pain radiating to LT shoulder X3 mo - sleeps with CPAP machine. Requesting imaging  due to fall in Nov 2025. No to flu vax    [employer] Language interpreter used during this encounter. # [place] [id number]  Subjective:   Roy Reed is a 69 y.o. male who presents for a Welcome to Medicare Exam.  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     Visit info / Clinical Intake: Medicare Wellness Visit Type:: Welcome to [employer] (IPPE) Persons participating in visit and providing information:: patient Medicare Wellness Visit Mode:: In-person (required for WTM) Interpreter Needed?: No Pre-visit prep was completed: no AWV questionnaire completed by patient prior to visit?: no Living arrangements:: lives with spouse/significant other (Patient lives with wife & children) Patient's Overall Health Status Rating: good Typical amount of pain: some (Recently waking up with a 6-7 pain out of 10 in the RT side of neck and shoulder) Does pain affect daily life?: no Are you currently prescribed opioids?: no  Dietary Habits and Nutritional Risks How many meals a day?: 2 Eats fruit and vegetables daily?: yes Most meals are obtained by: preparing own meals (Wife and daughter cook food at home) In the last 2 weeks, have you had any of the following?: none Diabetic:: no  Functional Status Activities of Daily Living (to include ambulation/medication): Independent Ambulation: Independent Medication Administration: Independent Home Management (perform basic housework or laundry): Independent Manage your own finances?: yes Primary transportation is: driving Concerns about vision?: no *vision screening is required for  WTM* Concerns about hearing?: no  Fall Screening Falls in the past year?: 1 Number of falls in past year: 0 Was there an injury with Fall?: 0 (Patient fell against a wall. Previously addressed by PCP) Fall Risk Category Calculator: 1 Patient Fall Risk Level: Low Fall Risk  Fall Risk Patient at Risk for Falls Due to: No Fall Risks Fall risk Follow up: Falls evaluation completed  Home and Transportation Safety: All rugs have non-skid backing?: yes All stairs or steps have railings?: N/A, no stairs Grab bars in the bathtub or shower?: (!) no Have non-skid surface in bathtub or shower?: (!) no Good home lighting?: yes Regular seat belt use?: yes Hospital stays in the last year:: no  Cognitive Assessment Difficulty concentrating, remembering, or making decisions? : no  Advance Directives (For Healthcare) Does Patient Have a Medical Advance Directive?: No Would patient like information on creating a medical advance directive?: Yes (MAU/Ambulatory/Procedural Areas - Information given)  Reviewed/Updated  Reviewed/Updated: Reviewed All (Medical, Surgical, Family, Medications, Allergies, Care Teams, Patient Goals); Medical History; Surgical History; Family History; Medications; Allergies; Care Teams; Patient Goals    Allergies (verified) Aspirin   Current Medications (verified) Outpatient Encounter Medications as of 09/28/2024  Medication Sig   atorvastatin  (LIPITOR) 10 MG tablet Take 1 tablet (10 mg total) by mouth daily.   diclofenac  Sodium (VOLTAREN  ARTHRITIS PAIN) 1 % GEL Apply 2 g topically 4 (four) times daily.   finasteride  (PROSCAR ) 5 MG tablet Take 1 tablet (5 mg total) by mouth daily.   fluticasone  (FLONASE ) 50 MCG/ACT nasal spray Place 1 spray into both nostrils daily.   metFORMIN  (GLUCOPHAGE ) 500 MG tablet Take  0.5 tablets (250 mg total) by mouth daily with breakfast.   sildenafil  (VIAGRA ) 100 MG tablet Take 1 tablet (100 mg total) by mouth daily as needed for erectile  dysfunction.   silodosin  (RAPAFLO ) 8 MG CAPS capsule Take 1 capsule (8 mg total) by mouth daily with breakfast.   tiZANidine  (ZANAFLEX ) 2 MG tablet Take 1 tablet (2 mg total) by mouth daily as needed for muscle spasms.   No facility-administered encounter medications on file as of 09/28/2024.    History: Past Medical History:  Diagnosis Date   Diabetes mellitus without complication (HCC)    Elevated PSA    HLD (hyperlipidemia)    side effect from Tamsulosin    Left knee pain    Low back pain    Pre-diabetes    Prostate cancer (HCC)    Rotator cuff disorder, right    Rotator cuff tear arthropathy of right shoulder 08/31/2017   Sleep apnea    Past Surgical History:  Procedure Laterality Date   COLONOSCOPY     PROSTATE BIOPSY     PROSTATE BIOPSY     RADIOACTIVE SEED IMPLANT N/A 01/02/2018   Procedure: RADIOACTIVE SEED IMPLANT/BRACHYTHERAPY IMPLANT;  Surgeon: Sherrilee Belvie CROME, MD;  Location: [facility];  Service: Urology;  Laterality: N/A;   SHOULDER ARTHROSCOPY WITH ROTATOR CUFF REPAIR AND SUBACROMIAL DECOMPRESSION Right 08/05/2017   Procedure: RIGHT SHOULDER ARTHROSCOPY WITH ROTATOR CUFF REPAIR, DISTAL CLAVICLE EXCISION, DEBRIDEMENT AND SUBACROMIAL DECOMPRESSION;  Surgeon: Jerri Kay HERO, MD;  Location: [facility];  Service: Orthopedics;  Laterality: Right;   SPACE OAR INSTILLATION N/A 01/02/2018   Procedure: SPACE OAR INSTILLATION;  Surgeon: Sherrilee Belvie CROME, MD;  Location: [facility];  Service: Urology;  Laterality: N/A;   Family History  Problem Relation Age of Onset   Colon cancer Neg Hx    Colon polyps Neg Hx    Esophageal cancer Neg Hx    Rectal cancer Neg Hx    Stomach cancer Neg Hx    Social History   Occupational History   Occupation: retired  Tobacco Use   Smoking status: Never   Smokeless tobacco: Never  Vaping Use   Vaping status: Never Used  Substance and Sexual Activity   Alcohol use: Not Currently   Drug  use: No   Sexual activity: Not on file   Tobacco Counseling Counseling given: Not Answered  SDOH Screenings   Food Insecurity: No Food Insecurity (09/28/2024)  Housing: Low Risk (09/28/2024)  Transportation Needs: No Transportation Needs (09/28/2024)  Utilities: Not At Risk (09/28/2024)  Alcohol Screen: Low Risk (07/30/2024)  Depression (PHQ2-9): Low Risk (09/28/2024)  Financial Resource Strain: Low Risk (07/30/2024)  Physical Activity: Insufficiently Active (09/28/2024)  Social Connections: Socially Integrated (09/28/2024)  Stress: No Stress Concern Present (09/28/2024)  Tobacco Use: Low Risk (09/28/2024)  Health Literacy: Adequate Health Literacy (09/28/2024)   See flowsheets for full screening details  Depression Screen PHQ 2 & 9 Depression Scale- Over the past 2 weeks, how often have you been bothered by any of the following problems? Little interest or pleasure in doing things: 0 Feeling down, depressed, or hopeless (PHQ Adolescent also includes...irritable): 0 PHQ-2 Total Score: 0 Trouble falling or staying asleep, or sleeping too much: 0 Feeling tired or having little energy: 0 Poor appetite or overeating (PHQ Adolescent also includes...weight loss): 0 Feeling bad about yourself - or that you are a failure or have let yourself or your family down: 0 Trouble concentrating on things, such as reading the  newspaper or watching television [facility] Adolescent also includes...like school work): 0 Moving or speaking so slowly that other people could have noticed. Or the opposite - being so fidgety or restless that you have been moving around a lot more than usual: 0 Thoughts that you would be better off dead, or of hurting yourself in some way: 0 PHQ-9 Total Score: 0 If you checked off any problems, how difficult have these problems made it for you to do your work, take care of things at home, or get along with other people?: Not difficult at all      Goals Addressed             This  Visit's Progress    Weight (lb) < 200 lb (90.7 kg)   264 lb (119.7 kg)    Wants to work on getting his weight down.        ROS: Discussed the use of AI scribe software for clinical note transcription with the patient, who gave verbal consent to proceed.  History of Present Illness Roy Reed is a 69 year old male who presents for a Medicare wellness visit.  He experiences redness and tearing in both eyes upon waking, which sometimes persists throughout the day. He denies itching or blurred vision. He last had an eye exam in September while in [place], where he was prescribed eye drops for dry eyes, which he did not use.  He describes left-sided neck and shoulder pain that began about a month ago after changing his pillow. The pain is strong, and endorses  numbness and tingling in the neck and shoulder.  No numbness or tingling down the arm. He uses an anti-inflammatory cream at night, which provides temporary relief, but the pain returns the next day.  Headaches 07/30/2024 visit hx : He continues to have chronic frontal headaches which he associates when there is lack of sleep and also thinks some of his medications may be contributing.  He takes all 4 of his medications in the morning including Rapaflo , metformin , atorvastatin  and finasteride .  About 30 minutes after taking his medicines he develops the headache.  There is no associated blurred vision, nausea, vomiting, dizziness or light sensitivity.  The headaches sometimes persist throughout the day. He fell 10 days ago offered chair that he was sitting on.  The chair was about 2 feet high.  He hit the back of the head against the wall.  He has not had any pain in this area being the occipital area.  He has not had any increase intensity of headache since hitting his head against the wall.  On that visit, I advised patient to start taking the atorvastatin  and Rapaflo  in the evenings and the metformin  and Proscar  in the mornings to help  tease out which medication may be causing or contributing to headaches.  He was encouraged to use his CPAP machine consistently.  Referral was submitted to a neurologist.  That appointment is coming up in March.  Today: He mentions a fall in November where he hit his head against a wall, leading to persistent headaches and sleep disturbances. He uses a CPAP machine but still feels very tired. He is concerned about potential internal damage from the fall and is seeking imaging to assess for any damage.  He started taking metformin  and finasteride  in the morning and has noticed headaches occurring after taking these medications.       Objective:    Today's Vitals   09/28/24 1616  BP:  121/77  Pulse: 79  Temp: 97.9 F (36.6 C)  TempSrc: Oral  SpO2: 96%  Weight: 264 lb (119.7 kg)  Height: 6' 1 (1.854 m)   Body mass index is 34.83 kg/m.   Physical Exam  General: Older African-American male in NAD. Eyes: Conjunctival injection bilaterally.  Positive arcus senilis CVS: Regular rate rhythm, no gallops or murmurs. Pulmonary: Lungs are clear bilaterally. Neuro: Cranial nerves grossly intact.  Power 5/5 in both upper and lower extremities proximally and distally.  Gross sensation intact. MSK: No tenderness on palpation of the cervical spine.  Mild tenderness on palpation of the trapezius muscle on the left side.  He has good range of motion of the left shoulder.  Hearing/Vision screen Vision Screening   Right eye Left eye Both eyes  Without correction     With correction 20/30 20/20 20/20   Hearing: grossly normal to conversational voice  Immunizations and Health Maintenance Health Maintenance  Topic Date Due   Medicare Annual Wellness (AWV)  Never done   COVID-19 Vaccine (4 - 2025-26 season) 05/07/2024   Influenza Vaccine  12/04/2024 (Originally 04/06/2024)   DTaP/Tdap/Td (2 - Td or Tdap) 08/05/2029   Colonoscopy  06/13/2030   Pneumococcal Vaccine: 50+ Years  Completed   Hepatitis C  Screening  Completed   Zoster Vaccines- Shingrix   Completed   Meningococcal B Vaccine  Aged Out    EKG: normal EKG, normal sinus rhythm     Assessment/Plan:  This is a routine wellness examination for Roy Reed. 1. Encounter for Medicare annual wellness exam (Primary) - EKG 12-Lead  2. Advance directive discussed with patient Discussed advance directive with patient including healthcare power of attorney versus living will.  We will currently out of packets to give him.  I will have them mail him out a packet once more becomes available.  3. Excessive tear production of both lacrimal glands - Ambulatory referral to Ophthalmology  4. Cervical radiculopathy May be due to positioning of neck at night.  Advised that he tries to find a comfortable pillow that allows his neck to be in alignment with his body. -Recommend referral for some physical therapy but patient wants to hold off for now. Will try him with Zanaflex  to take as needed.  Advised that the medication can cause some drowsiness. Try Advil or Aleve  with it for a few wks - tiZANidine  (ZANAFLEX ) 2 MG tablet; Take 1 tablet (2 mg total) by mouth daily as needed for muscle spasms.  Dispense: 30 tablet; Refill: 1  5. Headache syndrome Advised to have him hold the metformin  for now since he feels that one of the 2 medicines that he take in the mornings may contribute.  Keep upcoming appointment with neurology in March. - CT HEAD WO CONTRAST ( ); Future  6. Influenza vaccination declined   Patient Care Team: Vicci Barnie NOVAK, MD as PCP - General (Internal Medicine) Kate Lonni CROME, MD as PCP - Cardiology (Cardiology)  I have personally reviewed and noted the following in the patients chart:   Medical and social history Use of alcohol, tobacco or illicit drugs  Current medications and supplements including opioid prescriptions. Functional ability and status Nutritional status Physical activity Advanced  directives List of other physicians Hospitalizations, surgeries, and ER visits in previous 12 months Vitals Screenings to include cognitive, depression, and falls Referrals and appointments  Orders Placed This Encounter  Procedures   CT HEAD WO CONTRAST ( )    Standing Status:   Future    Expiration Date:  09/28/2025    Preferred imaging location?:   [facility]   Ambulatory referral to Ophthalmology    Referral Priority:   Routine    Referral Type:   Consultation    Referral Reason:   Specialty Services Required    Requested Specialty:   Ophthalmology    Number of Visits Requested:   1   EKG 12-Lead   In addition, I have reviewed and discussed with patient certain preventive protocols, quality metrics, and best practice recommendations. A written personalized care plan for preventive services as well as general preventive health recommendations were provided to patient.   Barnie Louder, MD   09/29/2024   Return in about 6 months (around 03/28/2025).  "

## 2024-09-28 NOTE — Patient Instructions (Signed)
" °  VISIT SUMMARY: During your visit, we discussed several health concerns including your eye redness and tearing, neck and shoulder pain, headaches, and general health maintenance. We have developed a plan to address each of these issues and ensure your overall well-being.  YOUR PLAN: -BILATERAL EPIPHORA: Bilateral epiphora refers to redness and tearing in both eyes. This can be caused by dry eye syndrome or other conditions like glaucoma. You have been referred to an ophthalmologist for a thorough evaluation, including a glaucoma assessment.  -CERVICAL RADICULOPATHY: Cervical radiculopathy is a condition where nerves in the neck are compressed, leading to pain, numbness, and tingling. This may be related to your pillow change. You have been prescribed tizanidine  to use as needed, but be cautious of drowsiness. You should also find a comfortable pillow and can take anti-inflammatory medication like Advil or Aleve  for 1-2 weeks. If symptoms persist, we may consider referring you to physical therapy.  -HEADACHE SYNDROME: Headache syndrome involves chronic headaches, which may be related to medication timing and sleep issues. We have decided to hold off on metformin  to see if it impacts your headaches. A CT scan of your head has been ordered to check for any damage from your previous fall. Continue to follow up with your neurologist in March.  -GENERAL HEALTH MAINTENANCE: During your Medicare wellness visit, we conducted an EKG which was normal. We discussed advance directives and your health goals. Please ensure you are up-to-date with vaccines and cancer screenings. We will provide you with an advance directive information packet when available. Additionally, we encourage you to focus on weight management.  INSTRUCTIONS: Please follow up with the ophthalmologist for your eye evaluation. Take tizanidine  as needed for neck and shoulder pain, and use anti-inflammatory medication for 1-2 weeks. Hold off on  metformin  to see if it affects your headaches, and complete the CT scan of your head as ordered. Continue to follow up with your neurologist in March. Ensure you are up-to-date with vaccines and cancer screenings, and focus on weight management.    Contains text generated by [employer].   "

## 2024-09-29 ENCOUNTER — Ambulatory Visit: Payer: Self-pay | Admitting: Internal Medicine

## 2024-09-29 ENCOUNTER — Encounter: Payer: Self-pay | Admitting: Internal Medicine

## 2024-10-01 ENCOUNTER — Other Ambulatory Visit: Payer: Self-pay

## 2024-10-03 NOTE — Progress Notes (Unsigned)
 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  Declines pharmacologic preventative treatment at this time, but advised that he may try the tizanidine  as needed. He will try using a non-contoured memory foam pillow He plans to follow up with sleep medicine regarding reassessment of his OSA Follow up 6 months.   Subjective:    Discussed the use of AI scribe software for clinical note transcription with the patient, who gave verbal consent to proceed.  History of Present Illness Roy Reed is a 69 year old right-handed male with HLD, sleep apnea and history of prostate cancer who presents for headache.  History supplemented by referring provider's note.  Accompanied by interpreter.  He experiences daily headaches, most prominent upon awakening, with a severity of 6 out of 10. The headaches are localized to the left frontal and temporal regions and described as a heavy sensation. The pain persists throughout the day but is most notable in the morning, particularly after nights with less than seven hours of sleep. He does not use over-the-counter analgesics and prefers to take medication only when prescribed. He notes sensitivity to phone noise during the headaches but denies visual changes phonophobia.  He frequently awakens twice nightly to urinate, which interrupts his sleep. He attributes worsening headaches to poor sleep and nasal congestion, stating that nasal obstruction prevents restful sleep and ongoing congestion contributes to sleep disruption. He uses a CPAP machine for  sleep apnea but continues to feel unrested and experiences persistent nasal congestion. He has a follow-up sleep study scheduled next month to further evaluate sleep quality and CPAP function.  He associates the onset and persistence of headaches with medications taken for prostate treatment, specifically atorvastatin , metformin , and finasteride , noting that headache was listed as a possible side effect. He has a history of falling and hitting his head against a wall in November 2025, but the headaches began prior to this event.  He also experiences left-sided neck pain radiating to the shoulder, which he believes may be related to his pillow or sleeping position. He recently changed his pillow but feels this worsened his symptoms. He was prescribed tizanidine  one week ago for neck pain but has not taken it, as the pain was not severe at the time. He denies numbness in the hands or pain radiating down the arm. He spends prolonged periods (six to eight hours) sitting at a computer or watching television, which he feels exacerbates his neck and shoulder discomfort.  He has a remote history of right shoulder surgery due to a tennis-related injury, but states the current pain is different and more severe than previous postsurgical pain. He also experiences intermittent knee pain, which began after a competition in September 2025.   Prescribed tizanidine  for muscle spasms but hasn't used it yet.  Past NSAIDS/analgesics:  tramadol , naproxen , acetaminophen  Past abortive triptans:  none Past abortive ergotamine:  none Past muscle relaxants:  Robaxin  Past anti-emetic:  Zofran  Past antihypertensive medications:  metoprolol  Past antidepressant/antipsychotic medications:  trazodone  Past anticonvulsant medications:  none Past anti-CGRP:  none   Current NSAIDS/analgesics:  none Current triptans:  none Current ergotamine:  none Current anti-emetic:  none Current muscle relaxants:  none Current  Antihypertensive medications:  none Current Antidepressant/antipsychotic medications:  none Current Anticonvulsant medications:  none Current anti-CGRP:  none Current Vitamins/Herbal/Supplements:  none Current Antihistamines/Decongestants:  Flonase  Other therapy:  none Other medications:  sildenafil , silodosin , metformin , finasteride , atorvastatin      PAST MEDICAL HISTORY: Past Medical History:  Diagnosis Date   Diabetes mellitus without complication (HCC)    Elevated PSA    HLD (hyperlipidemia)    side effect from Tamsulosin    Left knee pain    Low back pain    Pre-diabetes    Prostate cancer (HCC)    Rotator cuff disorder, right    Rotator cuff tear arthropathy of right shoulder 08/31/2017   Sleep apnea     PAST SURGICAL HISTORY: Past Surgical History:  Procedure Laterality Date   COLONOSCOPY     PROSTATE BIOPSY     PROSTATE BIOPSY     RADIOACTIVE SEED IMPLANT N/A 01/02/2018   Procedure: RADIOACTIVE SEED IMPLANT/BRACHYTHERAPY IMPLANT;  Surgeon: Sherrilee Belvie CROME, MD;  Location: [facility];  Service: Urology;  Laterality: N/A;   SHOULDER ARTHROSCOPY WITH ROTATOR CUFF REPAIR AND SUBACROMIAL DECOMPRESSION Right 08/05/2017   Procedure: RIGHT SHOULDER ARTHROSCOPY WITH ROTATOR CUFF REPAIR, DISTAL CLAVICLE EXCISION, DEBRIDEMENT AND SUBACROMIAL DECOMPRESSION;  Surgeon: Jerri Kay HERO, MD;  Location: [facility];  Service: Orthopedics;  Laterality: Right;   SPACE OAR INSTILLATION N/A 01/02/2018   Procedure: SPACE OAR INSTILLATION;  Surgeon: Sherrilee Belvie CROME, MD;  Location: [facility];  Service: Urology;  Laterality: N/A;    MEDICATIONS: Medications Ordered Prior to Encounter[1]  ALLERGIES: Allergies[2]  FAMILY HISTORY: Family History  Problem Relation Age of Onset   Colon cancer Neg Hx    Colon polyps Neg Hx    Esophageal cancer Neg Hx    Rectal cancer Neg Hx    Stomach cancer Neg Hx     Objective:  Blood pressure  134/89, pulse (!) 111, height 6' 1 (1.854 m), weight 265 lb (120.2 kg), SpO2 100%. General: No acute distress.  Patient appears well-groomed.   Head:  Normocephalic/atraumatic Eyes:  fundi examined but not visualized Neck: supple, left suboccipital and paraspinal tenderness, full range of motion Heart: regular rate and rhythm Neurological Exam: Mental status: alert and oriented to person, place, and time, speech fluent and not dysarthric, language intact. Cranial nerves: CN I: not tested CN II: pupils equal, round and reactive to light, visual fields intact CN III, IV, VI:  full range of motion, no nystagmus, no ptosis CN V: facial sensation intact. CN VII: upper and lower face symmetric CN VIII: hearing intact CN IX, X: gag intact, uvula midline CN XI: sternocleidomastoid and trapezius muscles intact CN XII: tongue midline Bulk & Tone: normal, no fasciculations. Motor:  muscle strength 5/5 throughout Sensation:  Pinprick and vibratory sensation intact. Deep Tendon Reflexes:  2+ throughout,  toes downgoing.   Finger to nose testing:  Without dysmetria.   Gait:  Normal station and stride.  Romberg negative.    Juliene Dunnings, DO  CC: Barnie Louder, MD        [1]  Current Outpatient Medications on File Prior to Visit  Medication Sig Dispense Refill   atorvastatin  (LIPITOR) 10 MG tablet Take 1 tablet (10 mg total) by mouth daily. 90 tablet 1   diclofenac  Sodium (VOLTAREN  ARTHRITIS PAIN) 1 % GEL Apply 2 g topically 4 (four) times daily. 100 g 2   finasteride  (PROSCAR ) 5 MG tablet Take 1  tablet (5 mg total) by mouth daily. 90 tablet 3   fluticasone  (FLONASE ) 50 MCG/ACT nasal spray Place 1 spray into both nostrils daily. 16 g 1   metFORMIN  (GLUCOPHAGE ) 500 MG tablet Take 0.5 tablets (250 mg total) by mouth daily with breakfast. 45 tablet 1   sildenafil  (VIAGRA ) 100 MG tablet Take 1 tablet (100 mg total) by mouth daily as needed for erectile dysfunction. 10 tablet 5   silodosin   (RAPAFLO ) 8 MG CAPS capsule Take 1 capsule (8 mg total) by mouth daily with breakfast. 30 capsule 11   tiZANidine  (ZANAFLEX ) 2 MG tablet Take 1 tablet (2 mg total) by mouth daily as needed for muscle spasms. 30 tablet 1   No current facility-administered medications on file prior to visit.  [2]  Allergies Allergen Reactions   Aspirin     States he can take if he takes acid reducer medicine first. Not taking    "

## 2024-10-04 ENCOUNTER — Encounter: Payer: Self-pay | Admitting: Neurology

## 2024-10-04 ENCOUNTER — Ambulatory Visit: Admitting: Neurology

## 2024-10-04 VITALS — BP 134/89 | HR 111 | Ht 73.0 in | Wt 265.0 lb

## 2024-10-04 DIAGNOSIS — G4486 Cervicogenic headache: Secondary | ICD-10-CM | POA: Diagnosis not present

## 2024-10-04 DIAGNOSIS — M542 Cervicalgia: Secondary | ICD-10-CM | POA: Diagnosis not present

## 2024-10-04 NOTE — Patient Instructions (Addendum)
 Refer to physical therapy regarding neck pain and cervicogenic headache Use a non-contoured memory foam pillow May use the tizanidine  prescribed by Dr. Vicci to treat neck pain as needed.    1. Consulter un physiothrapeute pour les douleurs cervicales et les cphales d'origine cervicale. 2. Utiliser un oreiller en mousse  mmoire de forme non profil. 3. Prendre la tizanidine  prescrite par le Dr Vicci, au besoin, pour soulager les douleurs cervicales.

## 2024-10-15 ENCOUNTER — Ambulatory Visit (HOSPITAL_BASED_OUTPATIENT_CLINIC_OR_DEPARTMENT_OTHER): Admitting: Pulmonary Disease

## 2024-10-29 ENCOUNTER — Ambulatory Visit: Admitting: Primary Care

## 2024-11-21 ENCOUNTER — Ambulatory Visit: Admitting: Neurology

## 2025-03-29 ENCOUNTER — Ambulatory Visit: Payer: Self-pay | Admitting: Internal Medicine

## 2025-04-09 ENCOUNTER — Ambulatory Visit: Payer: Self-pay | Admitting: Neurology

## 2025-06-13 ENCOUNTER — Other Ambulatory Visit

## 2025-06-19 ENCOUNTER — Ambulatory Visit: Admitting: Urology
